# Patient Record
Sex: Female | Born: 1950 | Race: White | Hispanic: No | Smoking: Never smoker
Health system: Southern US, Community
[De-identification: ages and names within clinical notes are randomized; demographics above are authoritative.]

## PROBLEM LIST (undated history)

## (undated) ENCOUNTER — Emergency Department (HOSPITAL_COMMUNITY)

## (undated) DIAGNOSIS — K56609 Unspecified intestinal obstruction, unspecified as to partial versus complete obstruction: Secondary | ICD-10-CM

## (undated) DIAGNOSIS — I471 Supraventricular tachycardia, unspecified: Secondary | ICD-10-CM

## (undated) DIAGNOSIS — M1990 Unspecified osteoarthritis, unspecified site: Secondary | ICD-10-CM

## (undated) DIAGNOSIS — I2510 Atherosclerotic heart disease of native coronary artery without angina pectoris: Secondary | ICD-10-CM

## (undated) DIAGNOSIS — M810 Age-related osteoporosis without current pathological fracture: Secondary | ICD-10-CM

## (undated) DIAGNOSIS — K449 Diaphragmatic hernia without obstruction or gangrene: Secondary | ICD-10-CM

## (undated) DIAGNOSIS — K08109 Complete loss of teeth, unspecified cause, unspecified class: Secondary | ICD-10-CM

## (undated) DIAGNOSIS — K219 Gastro-esophageal reflux disease without esophagitis: Secondary | ICD-10-CM

## (undated) DIAGNOSIS — Z862 Personal history of diseases of the blood and blood-forming organs and certain disorders involving the immune mechanism: Secondary | ICD-10-CM

## (undated) DIAGNOSIS — Z5189 Encounter for other specified aftercare: Secondary | ICD-10-CM

## (undated) DIAGNOSIS — Z9884 Bariatric surgery status: Secondary | ICD-10-CM

## (undated) DIAGNOSIS — I719 Aortic aneurysm of unspecified site, without rupture: Secondary | ICD-10-CM

## (undated) DIAGNOSIS — K469 Unspecified abdominal hernia without obstruction or gangrene: Secondary | ICD-10-CM

## (undated) DIAGNOSIS — E039 Hypothyroidism, unspecified: Secondary | ICD-10-CM

## (undated) DIAGNOSIS — Z8719 Personal history of other diseases of the digestive system: Secondary | ICD-10-CM

## (undated) DIAGNOSIS — D649 Anemia, unspecified: Secondary | ICD-10-CM

## (undated) DIAGNOSIS — Z973 Presence of spectacles and contact lenses: Secondary | ICD-10-CM

## (undated) DIAGNOSIS — E079 Disorder of thyroid, unspecified: Secondary | ICD-10-CM

## (undated) HISTORY — PX: TOTAL HIP ARTHROPLASTY: SHX124

## (undated) HISTORY — DX: Hypothyroidism, unspecified: E03.9

## (undated) HISTORY — PX: CHOLECYSTECTOMY: SHX55

## (undated) HISTORY — PX: ABDOMINAL HYSTERECTOMY: SHX81

## (undated) HISTORY — DX: Gastro-esophageal reflux disease without esophagitis: K21.9

## (undated) HISTORY — DX: Age-related osteoporosis without current pathological fracture: M81.0

## (undated) HISTORY — PX: ROTATOR CUFF REPAIR: SHX139

## (undated) HISTORY — PX: REPLACEMENT TOTAL KNEE: SUR1224

## (undated) HISTORY — PX: SMALL INTESTINE SURGERY: SHX150

## (undated) HISTORY — DX: Anemia, unspecified: D64.9

## (undated) HISTORY — PX: CATARACT EXTRACTION: SUR2

## (undated) HISTORY — PX: COLON SURGERY: SHX602

## (undated) HISTORY — DX: Encounter for other specified aftercare: Z51.89

## (undated) HISTORY — PX: FRACTURE SURGERY: SHX138

## (undated) HISTORY — PX: EYE SURGERY: SHX253

## (undated) HISTORY — PX: HERNIA REPAIR: SHX51

## (undated) HISTORY — PX: JOINT REPLACEMENT: SHX530

## (undated) HISTORY — PX: GASTRIC BYPASS: SHX52

---

## 2008-08-12 ENCOUNTER — Ambulatory Visit (HOSPITAL_COMMUNITY): Admission: RE | Admit: 2008-08-12 | Discharge: 2008-08-12 | Payer: Self-pay | Admitting: Internal Medicine

## 2009-08-13 ENCOUNTER — Ambulatory Visit (HOSPITAL_COMMUNITY): Admission: RE | Admit: 2009-08-13 | Discharge: 2009-08-13 | Payer: Self-pay | Admitting: Internal Medicine

## 2009-08-19 ENCOUNTER — Ambulatory Visit (HOSPITAL_COMMUNITY): Admission: RE | Admit: 2009-08-19 | Discharge: 2009-08-19 | Payer: Self-pay | Admitting: Internal Medicine

## 2011-06-27 ENCOUNTER — Other Ambulatory Visit (HOSPITAL_COMMUNITY): Payer: Self-pay | Admitting: Family Medicine

## 2011-06-27 DIAGNOSIS — Z139 Encounter for screening, unspecified: Secondary | ICD-10-CM

## 2011-06-30 ENCOUNTER — Ambulatory Visit (HOSPITAL_COMMUNITY)
Admission: RE | Admit: 2011-06-30 | Discharge: 2011-06-30 | Disposition: A | Discharge status: Home / Self Care | Payer: BC Managed Care – PPO | Source: Ambulatory Visit | Attending: Family Medicine | Admitting: Family Medicine

## 2011-06-30 DIAGNOSIS — Z139 Encounter for screening, unspecified: Secondary | ICD-10-CM

## 2011-06-30 DIAGNOSIS — Z1231 Encounter for screening mammogram for malignant neoplasm of breast: Secondary | ICD-10-CM | POA: Insufficient documentation

## 2011-07-05 ENCOUNTER — Other Ambulatory Visit: Payer: Self-pay | Admitting: Family Medicine

## 2011-07-05 DIAGNOSIS — R928 Other abnormal and inconclusive findings on diagnostic imaging of breast: Secondary | ICD-10-CM

## 2011-07-06 ENCOUNTER — Other Ambulatory Visit: Payer: Self-pay | Admitting: Family Medicine

## 2011-07-06 DIAGNOSIS — R928 Other abnormal and inconclusive findings on diagnostic imaging of breast: Secondary | ICD-10-CM

## 2011-07-26 ENCOUNTER — Ambulatory Visit (HOSPITAL_COMMUNITY)
Admission: RE | Admit: 2011-07-26 | Discharge: 2011-07-26 | Disposition: A | Discharge status: Home / Self Care | Payer: BC Managed Care – PPO | Source: Ambulatory Visit | Attending: Family Medicine | Admitting: Family Medicine

## 2011-07-26 ENCOUNTER — Other Ambulatory Visit (HOSPITAL_COMMUNITY): Payer: Self-pay | Admitting: Family Medicine

## 2011-07-26 ENCOUNTER — Other Ambulatory Visit: Payer: Self-pay | Admitting: Radiology

## 2011-07-26 ENCOUNTER — Inpatient Hospital Stay (HOSPITAL_COMMUNITY): Admission: RE | Admit: 2011-07-26 | Discharge: 2011-07-26 | Payer: BC Managed Care – PPO | Source: Ambulatory Visit

## 2011-07-26 ENCOUNTER — Other Ambulatory Visit: Payer: Self-pay | Admitting: Family Medicine

## 2011-07-26 DIAGNOSIS — N6019 Diffuse cystic mastopathy of unspecified breast: Secondary | ICD-10-CM | POA: Insufficient documentation

## 2011-07-26 DIAGNOSIS — R928 Other abnormal and inconclusive findings on diagnostic imaging of breast: Secondary | ICD-10-CM

## 2011-07-26 DIAGNOSIS — N63 Unspecified lump in unspecified breast: Secondary | ICD-10-CM

## 2011-07-26 NOTE — Procedures (Signed)
Ultrasound guided right breast core biopsy performed with 14 gauge Bard device utilizing sterile technique and under local anesthesia with 10 cc of 2% lidocaine. No blood loss or immediate complications. Clip placed. Pressure for 10 minutes applied. 3 passes performed. Mass collapsed on initial pass.

## 2011-07-26 NOTE — OR Nursing (Addendum)
Patient arrived ambulatory to Ultrasound Rm 1.  Dr Jean Rosenthal in to explain procedure to patient.  Patient verbalized understanding of procedure.  Consent obtained.   Pt For Core Biopsy right breast.  Time out at 11:30.  Xylocaine 2% 10 injected per Dr Jean Rosenthal at 11:32.  Tolerated well. Ended at 11:42.

## 2012-07-19 ENCOUNTER — Other Ambulatory Visit (HOSPITAL_COMMUNITY): Payer: Self-pay | Admitting: Family Medicine

## 2012-07-19 DIAGNOSIS — Z139 Encounter for screening, unspecified: Secondary | ICD-10-CM

## 2012-07-26 ENCOUNTER — Ambulatory Visit (HOSPITAL_COMMUNITY)
Admission: RE | Admit: 2012-07-26 | Discharge: 2012-07-26 | Disposition: A | Discharge status: Home / Self Care | Payer: BC Managed Care – PPO | Source: Ambulatory Visit | Attending: Family Medicine | Admitting: Family Medicine

## 2012-07-26 DIAGNOSIS — Z139 Encounter for screening, unspecified: Secondary | ICD-10-CM

## 2012-07-26 DIAGNOSIS — Z1231 Encounter for screening mammogram for malignant neoplasm of breast: Secondary | ICD-10-CM | POA: Insufficient documentation

## 2013-07-17 ENCOUNTER — Other Ambulatory Visit (HOSPITAL_COMMUNITY): Payer: Self-pay | Admitting: Family Medicine

## 2013-07-17 DIAGNOSIS — Z139 Encounter for screening, unspecified: Secondary | ICD-10-CM

## 2013-07-28 ENCOUNTER — Ambulatory Visit (HOSPITAL_COMMUNITY)
Admission: RE | Admit: 2013-07-28 | Discharge: 2013-07-28 | Disposition: A | Discharge status: Home / Self Care | Payer: BC Managed Care – PPO | Source: Ambulatory Visit | Attending: Family Medicine | Admitting: Family Medicine

## 2013-07-28 DIAGNOSIS — Z139 Encounter for screening, unspecified: Secondary | ICD-10-CM

## 2013-07-28 DIAGNOSIS — Z1231 Encounter for screening mammogram for malignant neoplasm of breast: Secondary | ICD-10-CM | POA: Insufficient documentation

## 2013-08-04 ENCOUNTER — Ambulatory Visit (HOSPITAL_COMMUNITY): Payer: BC Managed Care – PPO

## 2013-12-14 DIAGNOSIS — Z8719 Personal history of other diseases of the digestive system: Secondary | ICD-10-CM

## 2013-12-14 HISTORY — DX: Personal history of other diseases of the digestive system: Z87.19

## 2014-06-23 ENCOUNTER — Other Ambulatory Visit (HOSPITAL_COMMUNITY): Payer: Self-pay | Admitting: Family Medicine

## 2014-06-23 DIAGNOSIS — Z1231 Encounter for screening mammogram for malignant neoplasm of breast: Secondary | ICD-10-CM

## 2014-07-30 ENCOUNTER — Ambulatory Visit (HOSPITAL_COMMUNITY)
Admission: RE | Admit: 2014-07-30 | Discharge: 2014-07-30 | Disposition: A | Discharge status: Home / Self Care | Payer: BC Managed Care – PPO | Source: Ambulatory Visit | Attending: Family Medicine | Admitting: Family Medicine

## 2014-07-30 DIAGNOSIS — Z1231 Encounter for screening mammogram for malignant neoplasm of breast: Secondary | ICD-10-CM | POA: Diagnosis not present

## 2014-08-05 ENCOUNTER — Other Ambulatory Visit: Payer: Self-pay | Admitting: Family Medicine

## 2014-08-05 DIAGNOSIS — R928 Other abnormal and inconclusive findings on diagnostic imaging of breast: Secondary | ICD-10-CM

## 2014-08-18 ENCOUNTER — Ambulatory Visit (HOSPITAL_COMMUNITY)
Admission: RE | Admit: 2014-08-18 | Discharge: 2014-08-18 | Disposition: A | Discharge status: Home / Self Care | Payer: BC Managed Care – PPO | Source: Ambulatory Visit | Attending: Family Medicine | Admitting: Family Medicine

## 2014-08-18 ENCOUNTER — Other Ambulatory Visit: Payer: Self-pay | Admitting: Family Medicine

## 2014-08-18 DIAGNOSIS — R928 Other abnormal and inconclusive findings on diagnostic imaging of breast: Secondary | ICD-10-CM

## 2015-01-05 ENCOUNTER — Ambulatory Visit: Payer: Self-pay | Admitting: Rheumatology

## 2015-08-11 ENCOUNTER — Other Ambulatory Visit (HOSPITAL_COMMUNITY): Payer: Self-pay | Admitting: Family Medicine

## 2015-08-11 DIAGNOSIS — Z1231 Encounter for screening mammogram for malignant neoplasm of breast: Secondary | ICD-10-CM

## 2015-08-16 ENCOUNTER — Other Ambulatory Visit: Payer: Self-pay | Admitting: Radiology

## 2015-08-23 ENCOUNTER — Ambulatory Visit (HOSPITAL_COMMUNITY)
Admission: RE | Admit: 2015-08-23 | Discharge: 2015-08-23 | Disposition: A | Discharge status: Home / Self Care | Payer: BC Managed Care – PPO | Source: Ambulatory Visit | Attending: Family Medicine | Admitting: Family Medicine

## 2015-08-23 DIAGNOSIS — Z1231 Encounter for screening mammogram for malignant neoplasm of breast: Secondary | ICD-10-CM | POA: Insufficient documentation

## 2015-10-04 DIAGNOSIS — G44229 Chronic tension-type headache, not intractable: Secondary | ICD-10-CM | POA: Insufficient documentation

## 2015-10-04 DIAGNOSIS — R55 Syncope and collapse: Secondary | ICD-10-CM | POA: Insufficient documentation

## 2015-10-04 DIAGNOSIS — H8110 Benign paroxysmal vertigo, unspecified ear: Secondary | ICD-10-CM | POA: Insufficient documentation

## 2015-10-09 DIAGNOSIS — R200 Anesthesia of skin: Secondary | ICD-10-CM | POA: Insufficient documentation

## 2015-11-02 DIAGNOSIS — G5603 Carpal tunnel syndrome, bilateral upper limbs: Secondary | ICD-10-CM | POA: Insufficient documentation

## 2016-08-08 ENCOUNTER — Other Ambulatory Visit (HOSPITAL_COMMUNITY): Payer: Self-pay | Admitting: Physician Assistant

## 2016-08-08 DIAGNOSIS — Z1231 Encounter for screening mammogram for malignant neoplasm of breast: Secondary | ICD-10-CM

## 2016-08-28 ENCOUNTER — Ambulatory Visit (HOSPITAL_COMMUNITY): Payer: Self-pay

## 2016-08-28 ENCOUNTER — Ambulatory Visit (HOSPITAL_COMMUNITY)
Admission: RE | Admit: 2016-08-28 | Discharge: 2016-08-28 | Disposition: A | Discharge status: Home / Self Care | Payer: BC Managed Care – PPO | Source: Ambulatory Visit | Attending: Physician Assistant | Admitting: Physician Assistant

## 2016-08-28 DIAGNOSIS — Z1231 Encounter for screening mammogram for malignant neoplasm of breast: Secondary | ICD-10-CM | POA: Insufficient documentation

## 2016-10-16 DIAGNOSIS — Z9884 Bariatric surgery status: Secondary | ICD-10-CM

## 2016-10-16 HISTORY — DX: Bariatric surgery status: Z98.84

## 2017-03-20 DIAGNOSIS — Z8719 Personal history of other diseases of the digestive system: Secondary | ICD-10-CM | POA: Insufficient documentation

## 2017-04-03 ENCOUNTER — Telehealth: Payer: Self-pay | Admitting: Nutrition

## 2017-04-03 NOTE — Telephone Encounter (Signed)
vm left to call to schedule appt.

## 2017-05-16 ENCOUNTER — Encounter: Payer: BC Managed Care – PPO | Attending: Physician Assistant | Admitting: Nutrition

## 2017-05-16 VITALS — Ht 65.0 in | Wt 198.0 lb

## 2017-05-16 DIAGNOSIS — E119 Type 2 diabetes mellitus without complications: Secondary | ICD-10-CM | POA: Insufficient documentation

## 2017-05-16 DIAGNOSIS — Z713 Dietary counseling and surveillance: Secondary | ICD-10-CM | POA: Insufficient documentation

## 2017-05-16 DIAGNOSIS — E1165 Type 2 diabetes mellitus with hyperglycemia: Secondary | ICD-10-CM

## 2017-05-16 DIAGNOSIS — E118 Type 2 diabetes mellitus with unspecified complications: Secondary | ICD-10-CM

## 2017-05-16 DIAGNOSIS — E669 Obesity, unspecified: Secondary | ICD-10-CM

## 2017-05-16 DIAGNOSIS — Z833 Family history of diabetes mellitus: Secondary | ICD-10-CM | POA: Diagnosis not present

## 2017-05-16 DIAGNOSIS — IMO0002 Reserved for concepts with insufficient information to code with codable children: Secondary | ICD-10-CM

## 2017-05-16 NOTE — Progress Notes (Signed)
Diabetes Self-Management Education  Visit Type: First/Initial  Appt. Start Time: 1500 Appt. End Time: 1600  05/16/2017  [name], identified by name and date of birth, is a 66 y.o. female with a diagnosis of Diabetes: Type 2. She lives with her husband. Works full time. Eats 3 meals per day. Eats out often. Admits to eating ice cream before bedtime. Not exercising but willing to start walking. Doesn't want to go on medication. Willing to work on weight loss. A1C 7.1%.  She has family history of DM and had possible GDM with 2 babies over 9 lbs. Current diet is higher in fat, sodium and low in fresh fruits and vegetables.  ASSESSMENT  Height 5\' 5"  (1.651 m), weight 198 lb (89.8 kg). Body mass index is 32.95 kg/m.      Diabetes Self-Management Education - 05/16/17 1500      Visit Information   Visit Type First/Initial     Initial Visit   Diabetes Type Type 2   Are you currently following a meal plan? No   Are you taking your medications as prescribed? Not on Medications   Date Diagnosed May 2018     Health Coping   How would you rate your overall health? Good     Psychosocial Assessment   Patient Belief/Attitude about Diabetes Motivated to manage diabetes   Self-care barriers None   Self-management support Family   Other persons present Patient   Patient Concerns Nutrition/Meal planning;Monitoring;Healthy Lifestyle;Weight Control   Special Needs None   Preferred Learning Style No preference indicated   Learning Readiness Ready   How often do you need to have someone help you when you read instructions, pamphlets, or other written materials from your doctor or pharmacy? 1 - Never   What is the last grade level you completed in school? 12     Pre-Education Assessment   Patient understands the diabetes disease and treatment process. Needs Instruction   Patient understands incorporating nutritional management into lifestyle. Needs Instruction   Patient undertands  incorporating physical activity into lifestyle. Needs Instruction   Patient understands using medications safely. Needs Instruction   Patient understands monitoring blood glucose, interpreting and using results Needs Instruction   Patient understands prevention, detection, and treatment of acute complications. Needs Instruction   Patient understands prevention, detection, and treatment of chronic complications. Needs Instruction   Patient understands how to develop strategies to address psychosocial issues. Needs Instruction   Patient understands how to develop strategies to promote health/change behavior. Needs Instruction     Complications   Last HgB A1C per patient/outside source 7.1 %   How often do you check your blood sugar? 3-4 times / week   Fasting Blood glucose range (mg/dL) 16-109   Postprandial Blood glucose range (mg/dL) 604-540   Number of hypoglycemic episodes per month 0   Number of hyperglycemic episodes per week 0   Have you had a dilated eye exam in the past 12 months? Yes   Have you had a dental exam in the past 12 months? Yes   Are you checking your feet? Yes   How many days per week are you checking your feet? 7     Dietary Intake   Breakfast Oatmeal with nuts   Snack (morning) watermelon 1 cup   Lunch 1/4 sub, cheese steak, Dt Green Tea   Snack (afternoon) watermelon or fruit   Dinner [employer], potatoes, biscuit, water   Snack (evening) watermelon 1 cup   Beverage(s) water  Exercise   Exercise Type ADL's     Patient Education   Previous Diabetes Education No   Disease state  Definition of diabetes, type 1 and 2, and the diagnosis of diabetes   Nutrition management  Carbohydrate counting;Role of diet in the treatment of diabetes and the relationship between the three main macronutrients and blood glucose level;Meal timing in regards to the patients' current diabetes medication.;Information on hints to eating out and maintain blood glucose  control.;Meal options for control of blood glucose level and chronic complications.   Physical activity and exercise  Role of exercise on diabetes management, blood pressure control and cardiac health.;Identified with patient nutritional and/or medication changes necessary with exercise.;Helped patient identify appropriate exercises in relation to his/her diabetes, diabetes complications and other health issue.   Monitoring Purpose and frequency of SMBG.;Taught/discussed recording of test results and interpretation of SMBG.;Interpreting lab values - A1C, lipid, urine microalbumina.;Daily foot exams;Identified appropriate SMBG and/or A1C goals.;Yearly dilated eye exam   Chronic complications Relationship between chronic complications and blood glucose control;Lipid levels, blood glucose control and heart disease;Retinopathy and reason for yearly dilated eye exams;Reviewed with patient heart disease, higher risk of, and prevention   Psychosocial adjustment Role of stress on diabetes;Worked with patient to identify barriers to care and solutions   Personal strategies to promote health Lifestyle issues that need to be addressed for better diabetes care;Helped patient develop diabetes management plan for (enter comment)     Individualized Goals (developed by patient)   Nutrition Follow meal plan discussed;General guidelines for healthy choices and portions discussed;Adjust meds/carbs with exercise as discussed   Physical Activity Exercise 3-5 times per week;30 minutes per day   Medications Not Applicable   Monitoring  test my blood glucose as discussed   Reducing Risk examine blood glucose patterns     Post-Education Assessment   Patient understands the diabetes disease and treatment process. Needs Review   Patient understands incorporating nutritional management into lifestyle. Needs Review   Patient undertands incorporating physical activity into lifestyle. Needs Review   Patient understands using  medications safely. Needs Review   Patient understands monitoring blood glucose, interpreting and using results Needs Review   Patient understands prevention, detection, and treatment of acute complications. Needs Review   Patient understands prevention, detection, and treatment of chronic complications. Needs Review   Patient understands how to develop strategies to address psychosocial issues. Needs Review   Patient understands how to develop strategies to promote health/change behavior. Needs Review     Outcomes   Expected Outcomes Demonstrated interest in learning. Expect positive outcomes   Future DMSE 4-6 wks   Program Status Completed      Individualized Plan for Diabetes Self-Management Training:   Learning Objective:  Patient will have a greater understanding of diabetes self-management. Patient education plan is to attend individual and/or group sessions per assessed needs and concerns.   Plan:   Patient Instructions  Goals  Follow the Plate Method  Cut out ice cream  Go to [employer] twice a week   Get FBS less than 130 before breakfast and less than 150 before bed. Eat fruit with meals instead of snacks between meals. Only veggies or protein for snacks Lose 2-3  Lbs per month Get A1C to 6.5% or less    Expected Outcomes:  Demonstrated interest in learning. Expect positive outcomes  Education material provided: Living Well with Diabetes, Food label handouts, A1C conversion sheet, Meal plan card, My Plate and Carbohydrate counting sheet  If  problems or questions, patient to contact team via:  Phone and Email  Future DSME appointment: 4-6 wks

## 2017-05-16 NOTE — Patient Instructions (Signed)
Goals  Follow the Plate Method  Cut out ice cream  Go to [employer] twice a week   Get FBS less than 130 before breakfast and less than 150 before bed. Eat fruit with meals instead of snacks between meals. Only veggies or protein for snacks Lose 2-3  Lbs per month Get A1C to 6.5% or less

## 2017-07-16 ENCOUNTER — Ambulatory Visit: Payer: Self-pay | Admitting: Nutrition

## 2017-08-21 ENCOUNTER — Other Ambulatory Visit (HOSPITAL_COMMUNITY): Payer: Self-pay | Admitting: Emergency Medicine

## 2017-08-21 DIAGNOSIS — Z1231 Encounter for screening mammogram for malignant neoplasm of breast: Secondary | ICD-10-CM

## 2017-09-03 ENCOUNTER — Ambulatory Visit (HOSPITAL_COMMUNITY)
Admission: RE | Admit: 2017-09-03 | Discharge: 2017-09-03 | Disposition: A | Discharge status: Home / Self Care | Payer: BC Managed Care – PPO | Source: Ambulatory Visit | Attending: Emergency Medicine | Admitting: Emergency Medicine

## 2017-09-03 ENCOUNTER — Encounter (HOSPITAL_COMMUNITY): Payer: Self-pay

## 2017-09-03 DIAGNOSIS — Z1231 Encounter for screening mammogram for malignant neoplasm of breast: Secondary | ICD-10-CM | POA: Insufficient documentation

## 2018-04-25 DIAGNOSIS — K432 Incisional hernia without obstruction or gangrene: Secondary | ICD-10-CM | POA: Insufficient documentation

## 2018-08-08 ENCOUNTER — Other Ambulatory Visit (HOSPITAL_COMMUNITY): Payer: Self-pay | Admitting: Emergency Medicine

## 2018-08-08 DIAGNOSIS — Z1231 Encounter for screening mammogram for malignant neoplasm of breast: Secondary | ICD-10-CM

## 2018-08-12 ENCOUNTER — Emergency Department (HOSPITAL_COMMUNITY): Payer: BC Managed Care – PPO

## 2018-08-12 ENCOUNTER — Encounter (HOSPITAL_COMMUNITY): Payer: Self-pay | Admitting: Emergency Medicine

## 2018-08-12 ENCOUNTER — Observation Stay (HOSPITAL_COMMUNITY)
Admission: EM | Admit: 2018-08-12 | Discharge: 2018-08-14 | Disposition: A | Discharge status: Home / Self Care | Payer: BC Managed Care – PPO | Attending: Internal Medicine | Admitting: Internal Medicine

## 2018-08-12 DIAGNOSIS — Z7982 Long term (current) use of aspirin: Secondary | ICD-10-CM | POA: Insufficient documentation

## 2018-08-12 DIAGNOSIS — Z79899 Other long term (current) drug therapy: Secondary | ICD-10-CM | POA: Insufficient documentation

## 2018-08-12 DIAGNOSIS — E039 Hypothyroidism, unspecified: Secondary | ICD-10-CM | POA: Diagnosis not present

## 2018-08-12 DIAGNOSIS — I209 Angina pectoris, unspecified: Principal | ICD-10-CM | POA: Insufficient documentation

## 2018-08-12 DIAGNOSIS — R079 Chest pain, unspecified: Secondary | ICD-10-CM | POA: Diagnosis present

## 2018-08-12 DIAGNOSIS — K219 Gastro-esophageal reflux disease without esophagitis: Secondary | ICD-10-CM

## 2018-08-12 HISTORY — DX: Unspecified intestinal obstruction, unspecified as to partial versus complete obstruction: K56.609

## 2018-08-12 HISTORY — DX: Unspecified osteoarthritis, unspecified site: M19.90

## 2018-08-12 HISTORY — DX: Disorder of thyroid, unspecified: E07.9

## 2018-08-12 HISTORY — DX: Diaphragmatic hernia without obstruction or gangrene: K44.9

## 2018-08-12 LAB — TROPONIN I

## 2018-08-12 LAB — CBC WITH DIFFERENTIAL/PLATELET
ABS IMMATURE GRANULOCYTES: 0.02 10*3/uL (ref 0.00–0.07)
BASOS PCT: 1 %
Basophils Absolute: 0.1 10*3/uL (ref 0.0–0.1)
Eosinophils Absolute: 0 10*3/uL (ref 0.0–0.5)
Eosinophils Relative: 1 %
HCT: 37.3 % (ref 36.0–46.0)
Hemoglobin: 12 g/dL (ref 12.0–15.0)
Immature Granulocytes: 0 %
LYMPHS PCT: 20 %
Lymphs Abs: 1.3 10*3/uL (ref 0.7–4.0)
MCH: 31.4 pg (ref 26.0–34.0)
MCHC: 32.2 g/dL (ref 30.0–36.0)
MCV: 97.6 fL (ref 80.0–100.0)
MONOS PCT: 8 %
Monocytes Absolute: 0.5 10*3/uL (ref 0.1–1.0)
NEUTROS PCT: 70 %
Neutro Abs: 4.5 10*3/uL (ref 1.7–7.7)
PLATELETS: 142 10*3/uL — AB (ref 150–400)
RBC: 3.82 MIL/uL — ABNORMAL LOW (ref 3.87–5.11)
RDW: 12.5 % (ref 11.5–15.5)
WBC: 6.5 10*3/uL (ref 4.0–10.5)
nRBC: 0 % (ref 0.0–0.2)

## 2018-08-12 LAB — BASIC METABOLIC PANEL
Anion gap: 8 (ref 5–15)
BUN: 16 mg/dL (ref 8–23)
CALCIUM: 8.8 mg/dL — AB (ref 8.9–10.3)
CO2: 26 mmol/L (ref 22–32)
Chloride: 105 mmol/L (ref 98–111)
Creatinine, Ser: 0.51 mg/dL (ref 0.44–1.00)
GLUCOSE: 107 mg/dL — AB (ref 70–99)
POTASSIUM: 3.4 mmol/L — AB (ref 3.5–5.1)
SODIUM: 139 mmol/L (ref 135–145)

## 2018-08-12 NOTE — H&P (Signed)
History and Physical    Kayla Dean:[id number] DOB: Feb 20, 1951 DOA: 08/12/2018  PCP: Philbert Riser, MD   Patient coming from: Home  Chief Complaint: Chest pain  HPI: Kayla Dean is a 68 y.o. female with medical history significant for SBO, thyroid disease, chronic back pain who presented to the ED with complaints of sudden onset of chest pain.  Patient was at work today, sitting down, when suddenly she had 10 out of 10 radiating, mid chest pain, with associated diaphoresis, palpitation nausea without vomiting, she is unsure if she had some difficulty breathing.  Pain is described as a tightness/bandlike sensation around her chest. Never smoker.  Family history of coronary artery disease in her brother when he was 55.  No personal or family history of blood clots.  No recent travel no lower extremity pain no swelling. She takes daily meloxicam for arthritis.   ED Course: Tachycardic heart rate 48-55, otherwise stable vitals.  Two-view chest x-ray negative for acute abnormality.  EKG sinus bradycardia rate 50, T wave flattening 3 aVF-NO old EKG to compare.  Troponin unremarkable 0.63.  Hospitalist called to admit for chest pain rule out ACS.  Review of Systems: As per HPI all other systems reviewed and negative  Past Medical History:  Diagnosis Date  . Arthritis   . Hiatal hernia   . Small bowel obstruction (HCC)   . Thyroid disease     Past Surgical History:  Procedure Laterality Date  . ABDOMINAL HYSTERECTOMY    . CHOLECYSTECTOMY    . COLON SURGERY    . HERNIA REPAIR       reports that she has never smoked. She has never used smokeless tobacco. She reports that she does not drink alcohol or use drugs.  No Known Allergies  Family history coronary artery disease in her brother at age 32  Prior to Admission medications   Medication Sig Start Date End Date Taking? Authorizing Provider  Calcium Carb-Cholecalciferol (CALCIUM 600 + D) 600-200 MG-UNIT TABS Take 2  tablets by mouth every morning.   Yes [provider]  ferrous sulfate 325 (65 FE) MG tablet Take 325 mg by mouth every morning.   Yes [provider]  levothyroxine (SYNTHROID, LEVOTHROID) 137 MCG tablet Take 137 mcg by mouth daily before breakfast.   Yes [provider]  meloxicam (MOBIC) 15 MG tablet Take 15 mg by mouth every morning.  07/15/18  Yes [provider]  Multiple Vitamin (MULTIVITAMIN WITH MINERALS) TABS tablet Take 1 tablet by mouth every morning.   Yes [provider]    Physical Exam: Vitals:   08/12/18 1600 08/12/18 1700 08/12/18 1930 08/12/18 2030  BP: (!) 141/103 (!) 130/92 135/85 122/67  Pulse: (!) 48 (!) 51 (!) 55 (!) 52  Resp: 14 12 14 13   Temp:      TempSrc:      SpO2: 97% 98% 97% 96%  Weight:      Height:        Constitutional: NAD, calm, comfortable Vitals:   08/12/18 1600 08/12/18 1700 08/12/18 1930 08/12/18 2030  BP: (!) 141/103 (!) 130/92 135/85 122/67  Pulse: (!) 48 (!) 51 (!) 55 (!) 52  Resp: 14 12 14 13   Temp:      TempSrc:      SpO2: 97% 98% 97% 96%  Weight:      Height:       Eyes: PERRL, lids and conjunctivae normal ENMT: Mucous membranes are moist. Posterior pharynx clear  of any exudate or lesions.  Neck: normal, supple, no masses, no thyromegaly Respiratory: clear to auscultation bilaterally, no wheezing, no crackles. Normal respiratory effort. No accessory muscle use.  Cardiovascular: Regular rate and rhythm, no murmurs / rubs / gallops. Trace bilat lower extremity edema. 2+ pedal pulses.  Abdomen: no tenderness, no masses palpated. No hepatosplenomegaly. Bowel sounds positive.  Midline incisional hernia Musculoskeletal: no clubbing / cyanosis. No joint deformity upper and lower extremities. Good ROM, no contractures. Normal muscle tone.  Skin: no rashes, lesions, ulcers. No induration Neurologic: CN 2-12 grossly intact. Sensation intact, DTR normal. Strength 5/5 in all 4.  Psychiatric: Normal  judgment and insight. Alert and oriented x 3. Normal mood.   Labs on Admission: I have personally reviewed following labs and imaging studies  CBC: Recent Labs  Lab 08/12/18 1630  WBC 6.5  NEUTROABS 4.5  HGB 12.0  HCT 37.3  MCV 97.6  PLT 142*   Basic Metabolic Panel: Recent Labs  Lab 08/12/18 1630  NA 139  K 3.4*  CL 105  CO2 26  GLUCOSE 107*  BUN 16  CREATININE 0.51  CALCIUM 8.8*   Cardiac Enzymes: Recent Labs  Lab 08/12/18 1630  TROPONINI <0.03    Radiological Exams on Admission: Dg Chest 2 View  Result Date: 08/12/2018 CLINICAL DATA:  Chest tightness and weakness. EXAM: CHEST - 2 VIEW COMPARISON:  None. FINDINGS: Trachea is midline. Heart is enlarged. Linear atelectasis or scarring in the medial aspects of both lung bases. Lungs are otherwise clear. No pleural fluid. Suspect a hiatal hernia. IMPRESSION: No acute findings. Electronically Signed   By: Leanna Battles M.D.   On: 08/12/2018 16:37    EKG: Independently reviewed.  Sinus bradycardia heart rate 50.  Normal intervals. T wave flattening III, AVL.  No old EKG to compare.  Assessment/Plan Active Problems:   Chest pain   Chest pain-Atypical. Troponin x1- neg. EKG with nonspecific T wave abnormalities III, AVL.  Family history of coronary artery disease.  Never smoker.  Heart score 3-4. 324mg  aspirin given.  -EKG a.m. - Trop x 2 - ECHO -Cardiology consultation in a.m. - NPO midnight - Lipid panel a.m   DVT prophylaxis: Lovenox Code Status: Full Family Communication: None at bedside Disposition Plan: 1-2 days Consults called: Cardiology Admission status: Obs, tele   Onnie Boer MD [facility] Pager [phone] From 3PM-11PM.  Otherwise please contact night-coverage [email or web address] Password [facility]  08/12/2018, 9:19 PM

## 2018-08-12 NOTE — ED Provider Notes (Addendum)
[facility] EMERGENCY DEPARTMENT Provider Note   CSN: [id number] Arrival date & time: 08/12/18  1523     History   Chief Complaint Chief Complaint  Patient presents with  . Chest Pain    HPI Kayla Dean is a 67 y.o. female.  Chief complaint chest pain.  Symptoms started this morning with pain in her upper back.  At approximately noon today, she started having tightness like a "belt sensation" in her anterior chest with associated dyspnea, diaphoresis, nausea.  No previous history of MI.  Her brother had an MI at a similar age.  Cardiac risk factors are minimal.  Specifically no diabetes, hypertension, cigarette smoking.  She was at work when symptoms started.  Nothing makes her symptoms better or worse.     Past Medical History:  Diagnosis Date  . Arthritis   . Hiatal hernia   . Small bowel obstruction (HCC)   . Thyroid disease     There are no active problems to display for this patient.   Past Surgical History:  Procedure Laterality Date  . ABDOMINAL HYSTERECTOMY    . CHOLECYSTECTOMY    . COLON SURGERY    . HERNIA REPAIR       OB History   None      Home Medications    Prior to Admission medications   Medication Sig Start Date End Date Taking? Authorizing Provider  levothyroxine (SYNTHROID, LEVOTHROID) 112 MCG tablet Take 112 mcg by mouth daily.      [provider]    Family History History reviewed. No pertinent family history.  Social History Social History   Tobacco Use  . Smoking status: Never Smoker  . Smokeless tobacco: Never Used  Substance Use Topics  . Alcohol use: Never    Frequency: Never  . Drug use: Never     Allergies   Patient has no known allergies.   Review of Systems Review of Systems  All other systems reviewed and are negative.    Physical Exam Updated Vital Signs BP (!) 142/100 (BP Location: Left Arm)   Pulse (!) 50   Temp 98 F (36.7 C) (Oral)   Resp 18   Ht 5\' 5"  (1.651 m)   Wt 66.2 kg    SpO2 96%   BMI 24.30 kg/m   Physical Exam  Constitutional: She is oriented to person, place, and time. She appears well-developed and well-nourished.  HENT:  Head: Normocephalic and atraumatic.  Eyes: Conjunctivae are normal.  Neck: Neck supple.  Cardiovascular: Normal rate and regular rhythm.  Pulmonary/Chest: Effort normal and breath sounds normal.  Abdominal: Soft. Bowel sounds are normal.  Musculoskeletal: Normal range of motion.  Neurological: She is alert and oriented to person, place, and time.  Skin: Skin is warm and dry.  Psychiatric: She has a normal mood and affect. Her behavior is normal.  Nursing note and vitals reviewed.    ED Treatments / Results  Labs (all labs ordered are listed, but only abnormal results are displayed) Labs Reviewed  CBC WITH DIFFERENTIAL/PLATELET  BASIC METABOLIC PANEL  TROPONIN I    EKG None  Radiology No results found.  Procedures Procedures (including critical care time)  Medications Ordered in ED Medications - No data to display   Initial Impression / Assessment and Plan / ED Course  I have reviewed the triage vital signs and the nursing notes.  Pertinent labs & imaging results that were available during my care of the patient were reviewed by me and  considered in my medical decision making (see chart for details).     History and physical consistent with anginal equivalent chest pain.  She is hemodynamically stable.  Initial EKG and troponin negative.  Will admit to hospitalist service.  Final Clinical Impressions(s) / ED Diagnoses   Final diagnoses:  Chest pain, unspecified type    ED Discharge Orders    None       Donnetta Hutching, MD 08/12/18 1626    Donnetta Hutching, MD 08/12/18 [phone]

## 2018-08-12 NOTE — ED Notes (Signed)
Pt resting with eyes open, appears to be in no distress. Respirations are even and unlabored.  

## 2018-08-12 NOTE — ED Triage Notes (Signed)
Pt reports waking with bad back pain and then beginning to have chest pain around 200pm.  States she took tylenol 3 pta and ems gave her aspirin 324mg  by ems.

## 2018-08-13 ENCOUNTER — Encounter (HOSPITAL_COMMUNITY): Payer: Self-pay | Admitting: Student

## 2018-08-13 ENCOUNTER — Observation Stay (HOSPITAL_COMMUNITY): Payer: BC Managed Care – PPO

## 2018-08-13 ENCOUNTER — Other Ambulatory Visit: Payer: Self-pay

## 2018-08-13 DIAGNOSIS — Z7982 Long term (current) use of aspirin: Secondary | ICD-10-CM | POA: Diagnosis not present

## 2018-08-13 DIAGNOSIS — I351 Nonrheumatic aortic (valve) insufficiency: Secondary | ICD-10-CM

## 2018-08-13 DIAGNOSIS — R079 Chest pain, unspecified: Secondary | ICD-10-CM | POA: Diagnosis not present

## 2018-08-13 DIAGNOSIS — E039 Hypothyroidism, unspecified: Secondary | ICD-10-CM

## 2018-08-13 DIAGNOSIS — K219 Gastro-esophageal reflux disease without esophagitis: Secondary | ICD-10-CM | POA: Diagnosis not present

## 2018-08-13 DIAGNOSIS — I209 Angina pectoris, unspecified: Secondary | ICD-10-CM | POA: Diagnosis not present

## 2018-08-13 LAB — MRSA PCR SCREENING: MRSA BY PCR: NEGATIVE

## 2018-08-13 LAB — HEMOGLOBIN A1C
HEMOGLOBIN A1C: 4.7 % — AB (ref 4.8–5.6)
Mean Plasma Glucose: 88.19 mg/dL

## 2018-08-13 LAB — LIPID PANEL
CHOL/HDL RATIO: 3.4 ratio
Cholesterol: 126 mg/dL (ref 0–200)
HDL: 37 mg/dL — ABNORMAL LOW (ref >40)
LDL CALC: 77 mg/dL (ref 0–99)
Triglycerides: 60 mg/dL (ref <150)
VLDL: 12 mg/dL (ref 0–40)

## 2018-08-13 LAB — ECHOCARDIOGRAM COMPLETE
Height: 65 in
Weight: 2363.33 oz

## 2018-08-13 LAB — TROPONIN I

## 2018-08-13 MED ORDER — ONDANSETRON HCL 4 MG/2ML IJ SOLN: 4.0000 mg | Freq: Four times a day (QID) | INTRAMUSCULAR | Status: DC | PRN

## 2018-08-13 MED ORDER — LEVOTHYROXINE SODIUM 137 MCG PO TABS
137.0000 ug | ORAL_TABLET | Freq: Every day | ORAL | Status: DC
  Administered 2018-08-13: 137 ug via ORAL
  Filled 2018-08-13: qty 1

## 2018-08-13 MED ORDER — ACETAMINOPHEN 650 MG RE SUPP: 650.0000 mg | Freq: Four times a day (QID) | RECTAL | Status: DC | PRN

## 2018-08-13 MED ORDER — ENOXAPARIN SODIUM 40 MG/0.4ML ~~LOC~~ SOLN
40.0000 mg | SUBCUTANEOUS | Status: DC
  Administered 2018-08-13: 40 mg via SUBCUTANEOUS
  Filled 2018-08-13 (×2): qty 0.4

## 2018-08-13 MED ORDER — ASPIRIN EC 81 MG PO TBEC
81.0000 mg | DELAYED_RELEASE_TABLET | Freq: Every day | ORAL | Status: DC
  Administered 2018-08-13 – 2018-08-14 (×2): 81 mg via ORAL
  Filled 2018-08-13 (×2): qty 1

## 2018-08-13 MED ORDER — ONDANSETRON HCL 4 MG PO TABS: 4.0000 mg | ORAL_TABLET | Freq: Four times a day (QID) | ORAL | Status: DC | PRN

## 2018-08-13 MED ORDER — POLYETHYLENE GLYCOL 3350 17 G PO PACK: 17.0000 g | PACK | Freq: Every day | ORAL | Status: DC | PRN

## 2018-08-13 MED ORDER — ACETAMINOPHEN 325 MG PO TABS
650.0000 mg | ORAL_TABLET | Freq: Four times a day (QID) | ORAL | Status: DC | PRN
  Administered 2018-08-13: 650 mg via ORAL
  Filled 2018-08-13: qty 2

## 2018-08-13 NOTE — Consult Note (Addendum)
Cardiology Consult    Patient ID: Kayla Dean; [id number]; Mar 13, 1951   Admit date: 08/12/2018 Date of Consult: 08/13/2018  Primary Care Provider: Philbert Riser, MD Primary Cardiologist: New to [facility] - Dr. Wyline Mood  Patient Profile    Kayla Dean is a 67 y.o. female with past medical history of hypothyroidism and family history of CAD who is being seen today for the evaluation of chest pain at the request of Dr. Mariea Clonts.   History of Present Illness    Kayla Dean reports having generalized fatigue over the past few weeks but denies any associated chest pain or dyspnea during that timeframe. Yesterday morning, she developed a stabbing discomfort along her left shoulder and took Tylenol #3 but did not experience any improvement in her symptoms. Starting later that afternoon, she developed a tightness along her entire precordium which felt like she was wearing a very tight bra. She reports associated nausea, diaphoresis, and dyspnea during that timeframe.  ymptoms lasted for approximately an hour and improved with administration of ASA.  She denies any recurrent pain since.  No known personal history of CAD or cardiac arrhythmias. She does have hypothyroidism but denies any known HTN, HLD, or Type II DM.  Reports a strong family history of CAD with her brother having required stenting at the age of 67 and her father having known CAD as well.  She denies any history of alcohol use, tobacco use, or recreational drug use.  Initial labs show WBC 6.5, Hgb 12.0, platelets 142, Na+ 139, K+ 3.4, and creatinine 0.51. Initial and cyclic troponin values have been negative. CXR with no acute cardiopulmonary findings. EKG shows sinus bradycardia, HR 50, with no diagnostic ST abnormalities (no prior tracings available for comparison).    Past Medical History:  Diagnosis Date  . Arthritis   . Hiatal hernia   . Small bowel obstruction (HCC)   . Thyroid disease     Past Surgical History:    Procedure Laterality Date  . ABDOMINAL HYSTERECTOMY    . CHOLECYSTECTOMY    . COLON SURGERY    . HERNIA REPAIR       Home Medications:  Prior to Admission medications   Medication Sig Start Date End Date Taking? Authorizing Provider  Calcium Carb-Cholecalciferol (CALCIUM 600 + D) 600-200 MG-UNIT TABS Take 2 tablets by mouth every morning.   Yes [provider]  ferrous sulfate 325 (65 FE) MG tablet Take 325 mg by mouth every morning.   Yes [provider]  levothyroxine (SYNTHROID, LEVOTHROID) 137 MCG tablet Take 137 mcg by mouth daily before breakfast.   Yes [provider]  meloxicam (MOBIC) 15 MG tablet Take 15 mg by mouth every morning.  07/15/18  Yes [provider]  Multiple Vitamin (MULTIVITAMIN WITH MINERALS) TABS tablet Take 1 tablet by mouth every morning.   Yes [provider]    Inpatient Medications: Scheduled Meds: . enoxaparin (LOVENOX) injection  40 mg Subcutaneous Q24H  . levothyroxine  137 mcg Oral Q0600   Continuous Infusions:  PRN Meds: acetaminophen **OR** acetaminophen, ondansetron **OR** ondansetron (ZOFRAN) IV, polyethylene glycol  Allergies:   No Known Allergies  Social History:   Social History   Socioeconomic History  . Marital status: Married    Spouse name: Not on file  . Number of children: Not on file  . Years of education: Not on file  . Highest education level: Not on file  Occupational History  . Not on file  Social  Needs  . Financial resource strain: Not on file  . Food insecurity:    Worry: Not on file    Inability: Not on file  . Transportation needs:    Medical: Not on file    Non-medical: Not on file  Tobacco Use  . Smoking status: Never Smoker  . Smokeless tobacco: Never Used  Substance and Sexual Activity  . Alcohol use: Never    Frequency: Never  . Drug use: Never  . Sexual activity: Not on file  Lifestyle  . Physical activity:    Days per week: Not on file    Minutes per  session: Not on file  . Stress: Not on file  Relationships  . Social connections:    Talks on phone: Not on file    Gets together: Not on file    Attends religious service: Not on file    Active member of club or organization: Not on file    Attends meetings of clubs or organizations: Not on file    Relationship status: Not on file  . Intimate partner violence:    Fear of current or ex partner: Not on file    Emotionally abused: Not on file    Physically abused: Not on file    Forced sexual activity: Not on file  Other Topics Concern  . Not on file  Social History Narrative  . Not on file     Family History:    Family History  Problem Relation Age of Onset  . CAD Father   . CAD Brother       Review of Systems    General:  No chills, fever, night sweats or weight changes.  Cardiovascular:  No dyspnea on exertion, edema, orthopnea, palpitations, paroxysmal nocturnal dyspnea. Positive for chest pain.  Dermatological: No rash, lesions/masses Respiratory: No cough, dyspnea Urologic: No hematuria, dysuria Abdominal:   No nausea, vomiting, diarrhea, bright red blood per rectum, melena, or hematemesis Neurologic:  No visual changes, wkns, changes in mental status. All other systems reviewed and are otherwise negative except as noted above.  Physical Exam/Data    Vitals:   08/13/18 0401 08/13/18 0500 08/13/18 0600 08/13/18 0745  BP:   118/82   Pulse:  (!) 56 (!) 49   Resp:  15 14   Temp: 98.3 F (36.8 C)   97.8 F (36.6 C)  TempSrc: Oral   Oral  SpO2:  96% 92%   Weight: 67 kg     Height: 5\' 5"  (1.651 m)       Intake/Output Summary (Last 24 hours) at 08/13/2018 1130 Last data filed at 08/13/2018 0745 Gross per 24 hour  Intake 0 ml  Output -  Net 0 ml   Filed Weights   08/12/18 1530 08/13/18 0401  Weight: 66.2 kg 67 kg   Body mass index is 24.58 kg/m.   General: Pleasant, Caucasian female appearing in NAD Psych: Normal affect. Neuro: Alert and oriented X  3. Moves all extremities spontaneously. HEENT: Normal  Neck: Supple without bruits or JVD. Lungs:  Resp regular and unlabored, CTA without wheezing or rales. Heart: RRR no s3, s4, or murmurs. Abdomen: Soft, non-tender, non-distended, BS + x 4.  Extremities: No clubbing, cyanosis or edema. DP/PT/Radials 2+ and equal bilaterally.  Telemetry:  Telemetry was personally reviewed and demonstrates: Sinus bradycardia, HR in mid-40's to 50's. No significant pauses.    Labs/Studies     Relevant CV Studies:  Echocardiogram: Pending  Laboratory Data:  Chemistry Recent Labs  Lab 08/12/18 1630  NA 139  K 3.4*  CL 105  CO2 26  GLUCOSE 107*  BUN 16  CREATININE 0.51  CALCIUM 8.8*  GFRNONAA >60  GFRAA >60  ANIONGAP 8    No results for input(s): PROT, ALBUMIN, AST, ALT, ALKPHOS, BILITOT in the last 168 hours. Hematology Recent Labs  Lab 08/12/18 1630  WBC 6.5  RBC 3.82*  HGB 12.0  HCT 37.3  MCV 97.6  MCH 31.4  MCHC 32.2  RDW 12.5  PLT 142*   Cardiac Enzymes Recent Labs  Lab 08/12/18 1630 08/12/18 2215 08/13/18 0401  TROPONINI <0.03 <0.03 <0.03   No results for input(s): TROPIPOC in the last 168 hours.  BNPNo results for input(s): BNP, PROBNP in the last 168 hours.  DDimer No results for input(s): DDIMER in the last 168 hours.  Radiology/Studies:  Dg Chest 2 View  Result Date: 08/12/2018 CLINICAL DATA:  Chest tightness and weakness. EXAM: CHEST - 2 VIEW COMPARISON:  None. FINDINGS: Trachea is midline. Heart is enlarged. Linear atelectasis or scarring in the medial aspects of both lung bases. Lungs are otherwise clear. No pleural fluid. Suspect a hiatal hernia. IMPRESSION: No acute findings. Electronically Signed   By: Leanna Battles M.D.   On: 08/12/2018 16:37     Assessment & Plan    1. Precordial Chest Pain - presented with new-onset chest pressure which started yesterday afternoon with associated dyspnea, diaphoresis, and nausea. Symptoms lasted for an hour  and resolved with administration of ASA. Denies any recurrent pain since. - No known personal history of CAD, HTN, HLD, or Type II DM. Reports a strong family history of CAD with her brother having required stenting at the age of 33 and her father having known CAD as well.   - Initial and cyclic troponin values have been negative. EKG shows sinus bradycardia, HR 50, with no diagnostic ST abnormalities (no prior tracings available for comparison). Echocardiogram is pending. Would anticipate further ischemic evaluation pending echo results. If no significant abnormalities on echo, would plan for [facility]. If noted to have a reduced EF or new WMA, would require invasive evaluation with a catheterization.   2. Sinus Bradycardia - She has been in sinus bradycardia with heart rate in the mid 40's to 50's by review of telemetry. Denies any associated symptoms. Not on any AV nodal blocking agents PTA.  Will check TSH.  3. Hypothyroidism - has been continued on PTA Synthroid. Recheck TSH as outlined above.    For questions or updates, please contact [facility] Please consult [email or web address] for contact info under Cardiology/STEMI.  Signed, Ellsworth Lennox, PA-C 08/13/2018, 11:30 AM Pager: [phone]  Attending note  Patient seen and discussed with PA Iran Ouch, I agree with her her documentation. No known prior cardiac history. Presents with chest pain that started yesterday. Initially pain left shoulder blade, constant and somewhat positional. Later in the day developed pressure/tighntess across entire chest with diaphoresis, SOB. Lasted about 30 minutes. No recurrent symptoms.    Hgb 12 Plt 142 WBC 6.5 K 3.4 Cr 0.51  Trop neg x 3 CXR no acute process EKG sinus brady no acute ischemic changes Echo pending  No evidence of ischemia by EKG or enzymes thus far. We will await echo results, pending results would consider stress testing tomorrow, if abnormal echo would consider cath. WIll  need ischemic evaluation this admissin, echo will help determine whether noninvasive or invasive. Will start diet today, npo tonight at midnight. Asymptomatic sinus brady on  tele, continue to monitor, avoid av nodal agents, check TSH.    Dominga Ferry MD

## 2018-08-13 NOTE — Progress Notes (Signed)
PROGRESS NOTE    Kayla Dean  ZOX:[id number] DOB: [birth year] DOA: 08/12/2018 PCP: Philbert Riser, MD   Brief Narrative:  67 year old with a history of hypothyroidism, coronary artery disease in the family came to the hospital with complains of chest pain.  Patient states 1 day prior to her admission she started feeling left-sided shoulder pain followed by tightness around her chest which felt like a very tight " bra" along with some diaphoresis therefore came to the hospital.  Cardiology was consulted who recommended inpatient work-up starting with echocardiogram followed by stress test versus left heart catheterization.   Assessment & Plan:   Active Problems:   Chest pain   Hypothyroidism  Atypical chest pain - Although patient does not have a personal history, she does have strong family history of CAD.  Cardiac enzymes are negative X3.  EKG showed sinus bradycardia with no acute ST-T changes -Cardiology consulted, echocardiogram ordered-results pending.  Depending on the echocardiogram patient will need stress test versus left heart catheterization. -LDL is 77, will check hemoglobin A1c to further risk stratify -Check TSH  Hypothyroidism -Continue home Synthroid. -TSH-pending  DVT prophylaxis: Subcutaneous Lovenox Code Status: Full code Family Communication: Husband at bedside Disposition Plan: Maintain inpatient stay until cleared by cardiology, further work-up in place getting echocardiogram followed by stress test versus left heart catheterization  Consultants:   Cardiology  Procedures:   None  Antimicrobials:   None   Subjective: Patient states her chest pain is much lower intensity at this time, at worst it is 3/10 in intensity.  He still feels like slight chest tightness but much tolerable.  Review of Systems Otherwise negative except as per HPI, including: General: Denies fever, chills, night sweats or unintended weight loss. Resp: Denies cough,  wheezing, shortness of breath. Cardiac: Denies  palpitations, orthopnea, paroxysmal nocturnal dyspnea. GI: Denies abdominal pain, nausea, vomiting, diarrhea or constipation GU: Denies dysuria, frequency, hesitancy or incontinence MS: Denies muscle aches, joint pain or swelling Neuro: Denies headache, neurologic deficits (focal weakness, numbness, tingling), abnormal gait Psych: Denies anxiety, depression, SI/HI/AVH Skin: Denies new rashes or lesions ID: Denies sick contacts, exotic exposures, travel  Objective: Vitals:   08/13/18 0600 08/13/18 0745 08/13/18 1143 08/13/18 1200  BP: 118/82     Pulse: (!) 49   (!) 51  Resp: 14   17  Temp:  97.8 F (36.6 C) 98.1 F (36.7 C)   TempSrc:  Oral Oral   SpO2: 92%   96%  Weight:      Height:        Intake/Output Summary (Last 24 hours) at 08/13/2018 1332 Last data filed at 08/13/2018 0745 Gross per 24 hour  Intake 0 ml  Output -  Net 0 ml   Filed Weights   08/12/18 1530 08/13/18 0401  Weight: 66.2 kg 67 kg    Examination:  General exam: Appears calm and comfortable  Respiratory system: Clear to auscultation. Respiratory effort normal. Cardiovascular system: S1 & S2 heard, RRR. No JVD, murmurs, rubs, gallops or clicks. No pedal edema. Gastrointestinal system: Abdomen is nondistended, soft and nontender. No organomegaly or masses felt. Normal bowel sounds heard. Central nervous system: Alert and oriented. No focal neurological deficits. Extremities: Symmetric 5 x 5 power. Skin: No rashes, lesions or ulcers Psychiatry: Judgement and insight appear normal. Mood & affect appropriate.   Data Reviewed:   CBC: Recent Labs  Lab 08/12/18 1630  WBC 6.5  NEUTROABS 4.5  HGB 12.0  HCT 37.3  MCV 97.6  PLT 142*   Basic Metabolic Panel: Recent Labs  Lab 08/12/18 1630  NA 139  K 3.4*  CL 105  CO2 26  GLUCOSE 107*  BUN 16  CREATININE 0.51  CALCIUM 8.8*   GFR: Estimated Creatinine Clearance: 60.6 mL/min (by C-G formula  based on SCr of 0.51 mg/dL). Liver Function Tests: No results for input(s): AST, ALT, ALKPHOS, BILITOT, PROT, ALBUMIN in the last 168 hours. No results for input(s): LIPASE, AMYLASE in the last 168 hours. No results for input(s): AMMONIA in the last 168 hours. Coagulation Profile: No results for input(s): INR, PROTIME in the last 168 hours. Cardiac Enzymes: Recent Labs  Lab 08/12/18 1630 08/12/18 2215 08/13/18 0401  TROPONINI <0.03 <0.03 <0.03   BNP (last 3 results) No results for input(s): PROBNP in the last 8760 hours. HbA1C: No results for input(s): HGBA1C in the last 72 hours. CBG: No results for input(s): GLUCAP in the last 168 hours. Lipid Profile: Recent Labs    08/13/18 0401  CHOL 126  HDL 37*  LDLCALC 77  TRIG 60  CHOLHDL 3.4   Thyroid Function Tests: No results for input(s): TSH, T4TOTAL, FREET4, T3FREE, THYROIDAB in the last 72 hours. Anemia Panel: No results for input(s): VITAMINB12, FOLATE, FERRITIN, TIBC, IRON, RETICCTPCT in the last 72 hours. Sepsis Labs: No results for input(s): PROCALCITON, LATICACIDVEN in the last 168 hours.  Recent Results (from the past 240 hour(s))  MRSA PCR Screening     Status: None   Collection Time: 08/13/18  3:51 AM  Result Value Ref Range Status   MRSA by PCR NEGATIVE NEGATIVE Final    Comment:        The GeneXpert MRSA Assay (FDA approved for NASAL specimens only), is one component of a comprehensive MRSA colonization surveillance program. It is not intended to diagnose MRSA infection nor to guide or monitor treatment for MRSA infections. Performed at [facility], [address] [place], [place] [address]          Radiology Studies: Dg Chest 2 View  Result Date: 08/12/2018 CLINICAL DATA:  Chest tightness and weakness. EXAM: CHEST - 2 VIEW COMPARISON:  None. FINDINGS: Trachea is midline. Heart is enlarged. Linear atelectasis or scarring in the medial aspects of both lung bases. Lungs are otherwise clear. No  pleural fluid. Suspect a hiatal hernia. IMPRESSION: No acute findings. Electronically Signed   By: Leanna Battles M.D.   On: 08/12/2018 16:37        Scheduled Meds: . aspirin EC  81 mg Oral Daily  . enoxaparin (LOVENOX) injection  40 mg Subcutaneous Q24H  . levothyroxine  137 mcg Oral Q0600   Continuous Infusions:   LOS: 0 days   Time spent= 25 mins    Ariez Neilan Joline Maxcy, MD [facility] Pager [phone]   If 7PM-7AM, please contact night-coverage [email or web address] Password [email or web address] 08/13/2018, 1:32 PM

## 2018-08-13 NOTE — Progress Notes (Signed)
*  PRELIMINARY RESULTS* Echocardiogram 2D Echocardiogram has been performed.  Kayla Dean 08/13/2018, 1:37 PM

## 2018-08-14 ENCOUNTER — Inpatient Hospital Stay (HOSPITAL_COMMUNITY): Payer: BC Managed Care – PPO

## 2018-08-14 ENCOUNTER — Encounter (HOSPITAL_COMMUNITY): Payer: Self-pay

## 2018-08-14 DIAGNOSIS — K219 Gastro-esophageal reflux disease without esophagitis: Secondary | ICD-10-CM | POA: Diagnosis not present

## 2018-08-14 DIAGNOSIS — R079 Chest pain, unspecified: Secondary | ICD-10-CM

## 2018-08-14 DIAGNOSIS — E039 Hypothyroidism, unspecified: Secondary | ICD-10-CM | POA: Diagnosis not present

## 2018-08-14 LAB — NM MYOCAR MULTI W/SPECT W/WALL MOTION / EF
CHL CUP NUCLEAR SDS: 2
CHL CUP NUCLEAR SRS: 0
CHL CUP NUCLEAR SSS: 2
LHR: 0.37
LV dias vol: 86 mL (ref 46–106)
LV sys vol: 26 mL
Peak HR: 107 {beats}/min
Rest HR: 47 {beats}/min
TID: 0.88

## 2018-08-14 LAB — CBC
HCT: 36.5 % (ref 36.0–46.0)
Hemoglobin: 11.6 g/dL — ABNORMAL LOW (ref 12.0–15.0)
MCH: 30.9 pg (ref 26.0–34.0)
MCHC: 31.8 g/dL (ref 30.0–36.0)
MCV: 97.1 fL (ref 80.0–100.0)
PLATELETS: 119 10*3/uL — AB (ref 150–400)
RBC: 3.76 MIL/uL — ABNORMAL LOW (ref 3.87–5.11)
RDW: 12.4 % (ref 11.5–15.5)
WBC: 3.6 10*3/uL — ABNORMAL LOW (ref 4.0–10.5)
nRBC: 0 % (ref 0.0–0.2)

## 2018-08-14 LAB — BASIC METABOLIC PANEL
Anion gap: 6 (ref 5–15)
BUN: 10 mg/dL (ref 8–23)
CO2: 29 mmol/L (ref 22–32)
CREATININE: 0.52 mg/dL (ref 0.44–1.00)
Calcium: 8.7 mg/dL — ABNORMAL LOW (ref 8.9–10.3)
Chloride: 107 mmol/L (ref 98–111)
GFR calc non Af Amer: >60 mL/min (ref >60)
GLUCOSE: 87 mg/dL (ref 70–99)
Potassium: 3.1 mmol/L — ABNORMAL LOW (ref 3.5–5.1)
Sodium: 142 mmol/L (ref 135–145)

## 2018-08-14 LAB — TSH: TSH: 1.008 u[IU]/mL (ref 0.350–4.500)

## 2018-08-14 LAB — MAGNESIUM: Magnesium: 1.9 mg/dL (ref 1.7–2.4)

## 2018-08-14 LAB — HIV ANTIBODY (ROUTINE TESTING W REFLEX): HIV Screen 4th Generation wRfx: NONREACTIVE

## 2018-08-14 MED ORDER — SODIUM CHLORIDE 0.9% FLUSH
INTRAVENOUS | Status: AC
  Administered 2018-08-14: 10 mL via INTRAVENOUS
  Filled 2018-08-14: qty 10

## 2018-08-14 MED ORDER — POTASSIUM CHLORIDE CRYS ER 20 MEQ PO TBCR
40.0000 meq | EXTENDED_RELEASE_TABLET | Freq: Once | ORAL | Status: AC
  Administered 2018-08-14: 40 meq via ORAL
  Filled 2018-08-14: qty 2

## 2018-08-14 MED ORDER — TECHNETIUM TC 99M TETROFOSMIN IV KIT
10.0000 | PACK | Freq: Once | INTRAVENOUS | Status: AC | PRN
  Administered 2018-08-14: 11 via INTRAVENOUS

## 2018-08-14 MED ORDER — ASPIRIN 81 MG PO TBEC: 81.0000 mg | DELAYED_RELEASE_TABLET | Freq: Every day | ORAL | 1 refills | Status: DC

## 2018-08-14 MED ORDER — OMEPRAZOLE 20 MG PO CPDR: 20.0000 mg | DELAYED_RELEASE_CAPSULE | Freq: Every day | ORAL | 1 refills | Status: DC

## 2018-08-14 MED ORDER — REGADENOSON 0.4 MG/5ML IV SOLN
INTRAVENOUS | Status: AC
  Administered 2018-08-14: 0.4 mg via INTRAVENOUS
  Filled 2018-08-14: qty 5

## 2018-08-14 MED ORDER — TECHNETIUM TC 99M TETROFOSMIN IV KIT
30.0000 | PACK | Freq: Once | INTRAVENOUS | Status: AC | PRN
  Administered 2018-08-14: 31 via INTRAVENOUS

## 2018-08-14 NOTE — Progress Notes (Signed)
Pt discharged home with personal belongings and prescriptions.

## 2018-08-14 NOTE — Progress Notes (Signed)
Patient NPO for testing today. Dr. Selena Batten notified and ordered to hold this mornings Synthroid dose.

## 2018-08-14 NOTE — Progress Notes (Addendum)
Progress Note  Patient Name: Kayla Dean Date of Encounter: 08/14/2018  Primary Cardiologist: New to Dr. Wyline Mood this admission    Subjective   She denies any recurrent chest pain or dyspnea overnight. Evaluated in Nuc Med prior to stress testing. Has been NPO since midnight.   Inpatient Medications    Scheduled Meds: . aspirin EC  81 mg Oral Daily  . enoxaparin (LOVENOX) injection  40 mg Subcutaneous Q24H  . levothyroxine  137 mcg Oral Q0600   Continuous Infusions:  PRN Meds: acetaminophen **OR** acetaminophen, ondansetron **OR** ondansetron (ZOFRAN) IV, polyethylene glycol, technetium tetrofosmin   Vital Signs    Vitals:   08/13/18 1700 08/13/18 1749 08/13/18 2107 08/14/18 0507  BP:   139/87 122/73  Pulse: (!) 57 70 (!) 55 60  Resp: 20 17 16 19   Temp:   98.3 F (36.8 C) 98 F (36.7 C)  TempSrc:   Oral Oral  SpO2: 96% 96% 95% 97%  Weight:      Height:        Intake/Output Summary (Last 24 hours) at 08/14/2018 0739 Last data filed at 08/13/2018 1835 Gross per 24 hour  Intake 480 ml  Output -  Net 480 ml   Filed Weights   08/12/18 1530 08/13/18 0401  Weight: 66.2 kg 67 kg    Telemetry    Sinus bradycardia, HR in mid-40's to 50's with occasional PVC's. Infrequent episodes of ventricular trigeminy. - Personally Reviewed  ECG    Sinus bradycardia, HR 50, with no acute ST or T-wave changes.  - Personally Reviewed  Physical Exam   General: Well developed, well nourished Caucasian female appearing in no acute distress. Head: Normocephalic, atraumatic.  Neck: Supple without bruits, JVD not elevated. Lungs:  Resp regular and unlabored, CTA without wheezing or rales. Heart: RRR, S1, S2, no S3, S4, or murmur; no rub. Abdomen: Soft, non-tender, non-distended with normoactive bowel sounds. No hepatomegaly. No rebound/guarding. No obvious abdominal masses. Extremities: No clubbing, cyanosis, or lower extremity edema. Distal pedal pulses are 2+  bilaterally. Neuro: Alert and oriented X 3. Moves all extremities spontaneously. Psych: Normal affect.  Labs    Chemistry Recent Labs  Lab 08/12/18 1630 08/14/18 0525  NA 139 142  K 3.4* 3.1*  CL 105 107  CO2 26 29  GLUCOSE 107* 87  BUN 16 10  CREATININE 0.51 0.52  CALCIUM 8.8* 8.7*  GFRNONAA >60 >60  GFRAA >60 >60  ANIONGAP 8 6     Hematology Recent Labs  Lab 08/12/18 1630 08/14/18 0525  WBC 6.5 3.6*  RBC 3.82* 3.76*  HGB 12.0 11.6*  HCT 37.3 36.5  MCV 97.6 97.1  MCH 31.4 30.9  MCHC 32.2 31.8  RDW 12.5 12.4  PLT 142* 119*    Cardiac Enzymes Recent Labs  Lab 08/12/18 1630 08/12/18 2215 08/13/18 0401  TROPONINI <0.03 <0.03 <0.03   No results for input(s): TROPIPOC in the last 168 hours.   BNPNo results for input(s): BNP, PROBNP in the last 168 hours.   DDimer No results for input(s): DDIMER in the last 168 hours.   Radiology    Dg Chest 2 View  Result Date: 08/12/2018 CLINICAL DATA:  Chest tightness and weakness. EXAM: CHEST - 2 VIEW COMPARISON:  None. FINDINGS: Trachea is midline. Heart is enlarged. Linear atelectasis or scarring in the medial aspects of both lung bases. Lungs are otherwise clear. No pleural fluid. Suspect a hiatal hernia. IMPRESSION: No acute findings. Electronically Signed   By: Juliette Alcide  Blietz M.D.   On: 08/12/2018 16:37    Cardiac Studies   Echocardiogram: 08/13/2018 Study Conclusions  - Left ventricle: The cavity size was normal. Wall thickness was   increased in a pattern of mild LVH. Systolic function was normal.   The estimated ejection fraction was in the range of 60% to 65%.   Wall motion was normal; there were no regional wall motion   abnormalities. Doppler parameters are consistent with abnormal   left ventricular relaxation (grade 1 diastolic dysfunction). - Aortic valve: There was mild regurgitation. Valve area (VTI):   2.98 cm^2. Valve area (Vmax): 2.26 cm^2. - Left atrium: The atrium was mildly dilated. -  Atrial septum: No defect or patent foramen ovale was identified.  Patient Profile     67 y.o. female w/ PMH of hypothyroidism and family history of CAD who presented to [facility] ED on 08/12/2018 for evaluation of new-onset chest pain.   Assessment & Plan    1. Precordial Chest Pain  - presented with new-onset chest pressure with associated dyspnea, diaphoresis, and nausea which started the day of admission and lasted for an hour. She denies any recurrent pain since admission.  - initial and cyclic troponin values have been negative. No acute EKG changes when compared to prior tracings. Echo shows a preserved EF of 60-65% with Grade 1 DD and no regional WMA.  - presented for 1-day Lexiscan Myoview this morning and tolerated well. Official report pending following stress images. If low-risk, no further cardiac testing planned this admission.   2. Hypothyroidism - TSH 1.008. Continue Synthroid at current dosing.   3. Ventricular Trigeminy - Mg 1.9, K+ low at 3.1. Will replace. Not on BB therapy given asymptomatic baseline bradycardia.    For questions or updates, please contact [facility] Please consult [email or web address] for contact info under Cardiology/STEMI.   Lorri Frederick , PA-C 7:39 AM 08/14/2018 Pager: [phone]  Attending note Patient seen and discussed with PA Iran Ouch, I agree with her documentation. Negative workup for ACS by enzymes and EKG, echo shows normal LV function without WMAs, nuclear stress test without ischemia  No further cardiac workup at this time. We will arrange outpatient follow up, ok for discharge from cardiac standpoint  [facility] will sign off.   Medication Recommendations:  No changes Other recommendations (labs, testing, etc):  None Follow up as an outpatient:  We will arrange f/u 3-4 weeks.    Dina Rich MD

## 2018-08-14 NOTE — Progress Notes (Signed)
Patient's IV removed and site intact.  

## 2018-08-14 NOTE — Discharge Summary (Signed)
Physician Discharge Summary  FRIDA WAHLSTROM NWG:[id number] DOB: 10-28-1950 DOA: 08/12/2018  PCP: Philbert Riser, MD  Admit date: 08/12/2018 Discharge date: 08/14/2018  Time spent: 35 minutes  Recommendations for Outpatient Follow-up:  1. Repeat basic metabolic panel to follow electrolytes and renal function. 2. Reassess blood pressure and if needed consider starting medication   Discharge Diagnoses:  Active Problems:   Chest pain   Hypothyroidism   Gastroesophageal reflux disease elevated BP.  Discharge Condition: Stable and improved.  Patient discharged home with instruction to follow-up with cardiology as instructed and to follow-up with her PCP in 2 weeks.  Diet recommendation: Heart healthy diet  Filed Weights   08/12/18 1530 08/13/18 0401  Weight: 66.2 kg 67 kg    History of present illness:  As per H&P written by Dr. Mariea Clonts on 08/12/18 67 y.o. female with medical history significant for SBO, thyroid disease, chronic back pain who presented to the ED with complaints of sudden onset of chest pain.  Patient was at work today, sitting down, when suddenly she had 10 out of 10 radiating, mid chest pain, with associated diaphoresis, palpitation nausea without vomiting, she is unsure if she had some difficulty breathing.  Pain is described as a tightness/bandlike sensation around her chest. Never smoker.  Family history of coronary artery disease in her brother when he was 5.  No personal or family history of blood clots.  No recent travel no lower extremity pain no swelling. She takes daily meloxicam for arthritis.   ED Course: Tachycardic heart rate 48-55, otherwise stable vitals.  Two-view chest x-ray negative for acute abnormality.  EKG sinus bradycardia rate 50, T wave flattening 3 aVF-NO old EKG to compare.  Troponin unremarkable 0.63.  Hospitalist called to admit for chest pain rule out ACS.  Hospital Course:  1-chest pain: Noncardiac and most likely associated with  reflux disease. -Troponin negative x3, no acute ischemic changes on EKG or telemetry -2D echo with preserved ejection fraction and no wall motion abnormalities. -Stress test negative for acute ischemia. -Continue baby aspirin, lifestyle changes and outpatient follow-up with cardiology service as recommended.  2-GERD -Patient started on Prilosec 20 mg by mouth daily -Advised to minimize the use of NSAIDs.  3-hypothyroidism -Continue Synthroid  4-mild elevation in blood pressure -No prior history of hypertension -Most likely associated with pain during initial admission process -At discharge systolic blood pressure stable in the 130s to 120s -Will recommend outpatient follow-up on her vital signs.  Procedures:  See below for x-ray reports  2D echo: Normal ejection fraction, no wall motion abnormalities.  Nuclear stress test: Negative for acute ischemia.  Consultations:  Cardiology  Discharge Exam: Vitals:   08/13/18 2107 08/14/18 0507  BP: 139/87 122/73  Pulse: (!) 55 60  Resp: 16 19  Temp: 98.3 F (36.8 C) 98 F (36.7 C)  SpO2: 95% 97%    General: Afebrile, no chest pain, no shortness of breath, no nausea, no vomiting. Cardiovascular: S1 and S2, no rubs, no gallops, no JVD. Respiratory: Good air movement bilaterally, no wheezing, no crackles. Abdomen: Soft, nontender, nondistended, positive bowel sounds Extremities: No edema, no cyanosis or clubbing.  Discharge Instructions    Allergies as of 08/14/2018   No Known Allergies     Medication List    STOP taking these medications   meloxicam 15 MG tablet Commonly known as:  MOBIC     TAKE these medications   aspirin 81 MG EC tablet Take 1 tablet (81 mg total) by  mouth daily. Start taking on:  08/15/2018   CALCIUM 600 + D 600-200 MG-UNIT Tabs Generic drug:  Calcium Carb-Cholecalciferol Take 2 tablets by mouth every morning.   ferrous sulfate 325 (65 FE) MG tablet Take 325 mg by mouth every  morning.   levothyroxine 137 MCG tablet Commonly known as:  SYNTHROID, LEVOTHROID Take 137 mcg by mouth daily before breakfast.   multivitamin with minerals Tabs tablet Take 1 tablet by mouth every morning.   omeprazole 20 MG capsule Commonly known as:  PRILOSEC Take 1 capsule (20 mg total) by mouth daily.      No Known Allergies Follow-up Information    Ellsworth Lennox, PA-C Follow up on 09/26/2018.   Specialties:  Physician Assistant, Cardiology Why:  Cardiology Follow-Up on 09/26/2018 at 1:00PM.  Contact information: [address] [place] [address] [phone]        Philbert Riser, MD. Schedule an appointment as soon as possible for a visit in 2 week(s).   Specialty:  Family Medicine Contact information: [address] [place] [address] [phone]           The results of significant diagnostics from this hospitalization (including imaging, microbiology, ancillary and laboratory) are listed below for reference.    Significant Diagnostic Studies: Dg Chest 2 View  Result Date: 08/12/2018 CLINICAL DATA:  Chest tightness and weakness. EXAM: CHEST - 2 VIEW COMPARISON:  None. FINDINGS: Trachea is midline. Heart is enlarged. Linear atelectasis or scarring in the medial aspects of both lung bases. Lungs are otherwise clear. No pleural fluid. Suspect a hiatal hernia. IMPRESSION: No acute findings. Electronically Signed   By: Leanna Battles M.D.   On: 08/12/2018 16:37   Nm Myocar Multi W/spect W/wall Motion / Ef  Result Date: 08/14/2018  There was no ST segment deviation noted during stress.  The study is normal. There are no perfusion defects consistent with prior infarct or ischemia.  The left ventricular ejection fraction is hyperdynamic (>65%).  This is a low risk study.     Microbiology: Recent Results (from the past 240 hour(s))  MRSA PCR Screening     Status: None   Collection Time: 08/13/18  3:51 AM  Result Value Ref Range Status    MRSA by PCR NEGATIVE NEGATIVE Final    Comment:        The GeneXpert MRSA Assay (FDA approved for NASAL specimens only), is one component of a comprehensive MRSA colonization surveillance program. It is not intended to diagnose MRSA infection nor to guide or monitor treatment for MRSA infections. Performed at [facility], [address] [place], [place] [address]      Labs: Basic Metabolic Panel: Recent Labs  Lab 08/12/18 1630 08/14/18 0525  NA 139 142  K 3.4* 3.1*  CL 105 107  CO2 26 29  GLUCOSE 107* 87  BUN 16 10  CREATININE 0.51 0.52  CALCIUM 8.8* 8.7*  MG  --  1.9   CBC: Recent Labs  Lab 08/12/18 1630 08/14/18 0525  WBC 6.5 3.6*  NEUTROABS 4.5  --   HGB 12.0 11.6*  HCT 37.3 36.5  MCV 97.6 97.1  PLT 142* 119*   Cardiac Enzymes: Recent Labs  Lab 08/12/18 1630 08/12/18 2215 08/13/18 0401  TROPONINI <0.03 <0.03 <0.03    Signed:  Vassie Loll MD.  [facility] 08/14/2018, 12:06 PM

## 2018-09-09 ENCOUNTER — Ambulatory Visit (HOSPITAL_COMMUNITY)
Admission: RE | Admit: 2018-09-09 | Discharge: 2018-09-09 | Disposition: A | Discharge status: Home / Self Care | Payer: BC Managed Care – PPO | Source: Ambulatory Visit | Attending: Emergency Medicine | Admitting: Emergency Medicine

## 2018-09-09 DIAGNOSIS — Z1231 Encounter for screening mammogram for malignant neoplasm of breast: Secondary | ICD-10-CM | POA: Insufficient documentation

## 2018-09-26 ENCOUNTER — Ambulatory Visit: Payer: Self-pay | Admitting: Student

## 2019-04-17 IMAGING — MG DIGITAL SCREENING BILATERAL MAMMOGRAM WITH TOMO AND CAD
8 series · 8 of 24 positions shown · non-contrast
Comparison: Previous exam(s).

CLINICAL DATA: Screening.

EXAM:
DIGITAL SCREENING BILATERAL MAMMOGRAM WITH TOMO AND CAD

[R MLO synth-2D]
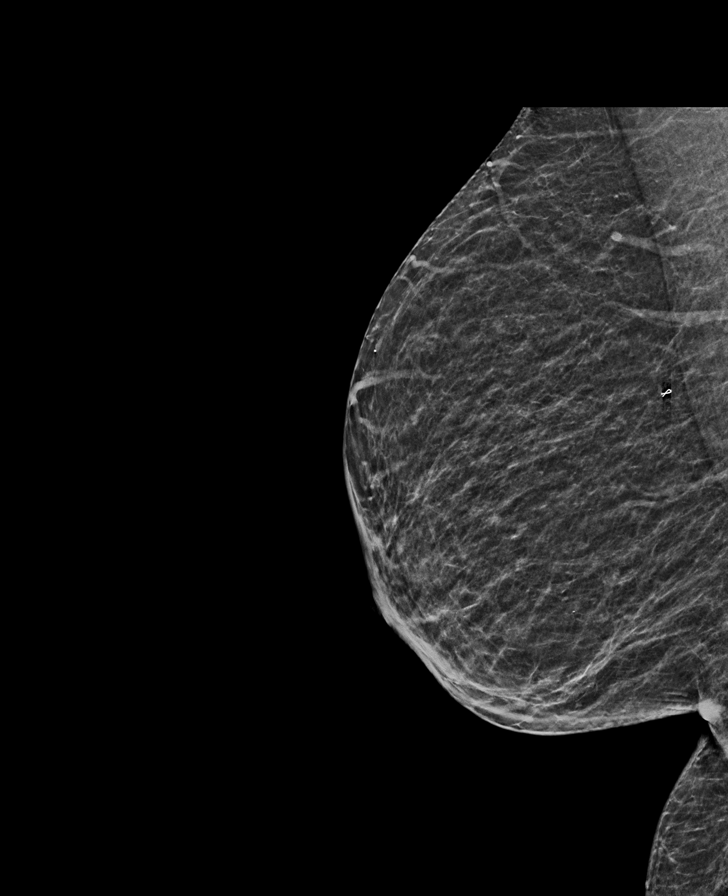

[L MLO synth-2D]
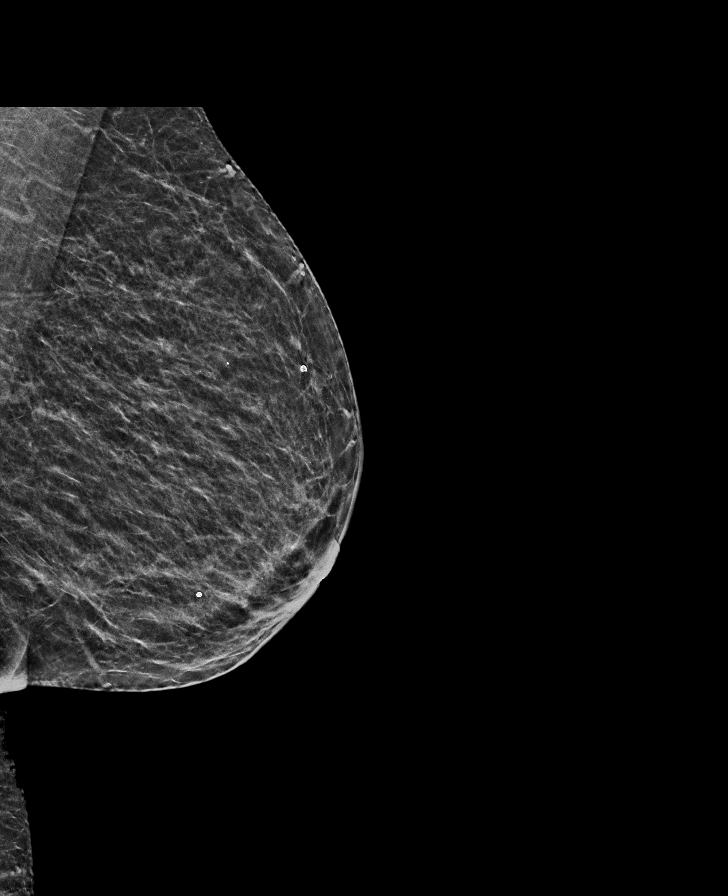

[L CC synth-2D]
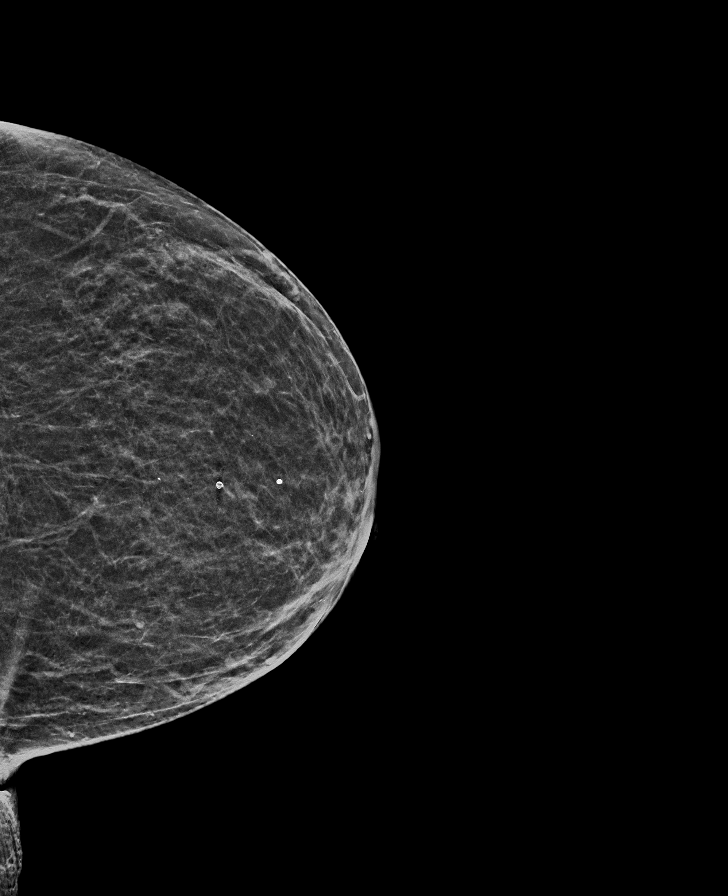

[R CC synth-2D]
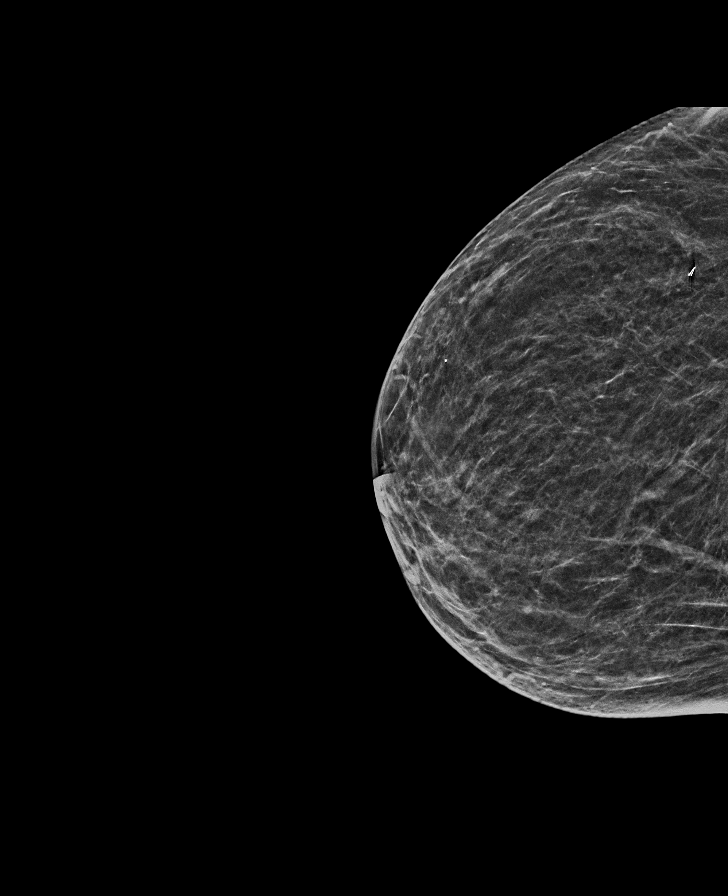

[R MLO tomo · tomo slice 25/48.0]
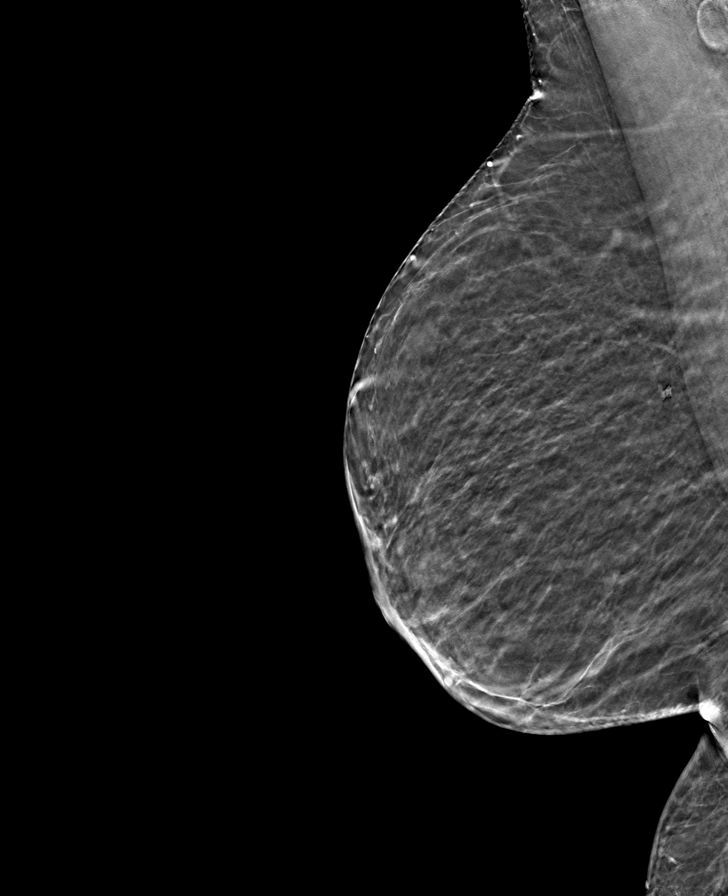

[R CC tomo · tomo slice 26/51.0]
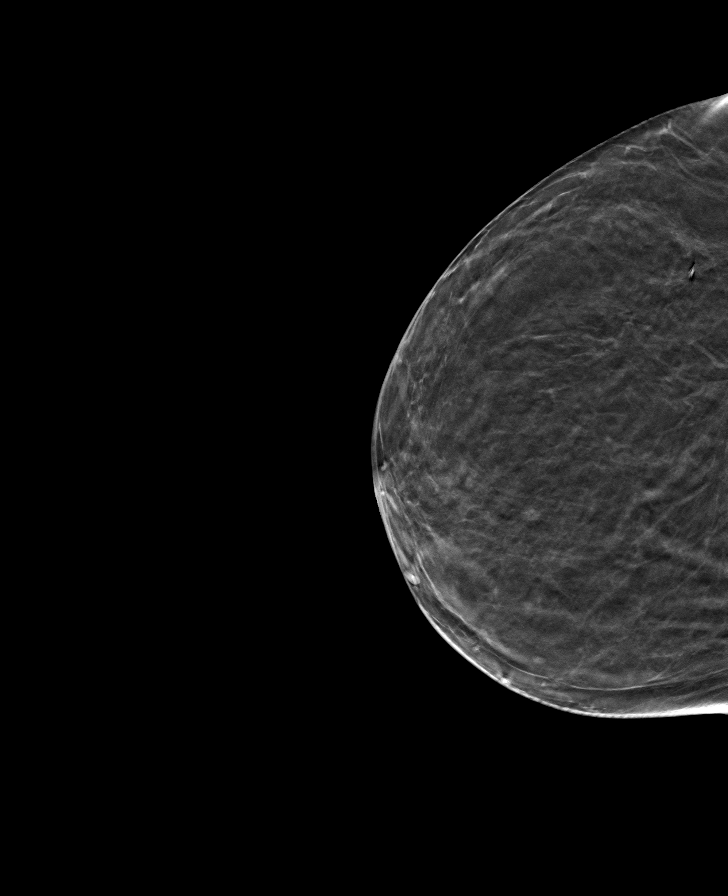

[L MLO tomo · tomo slice 25/49.0]
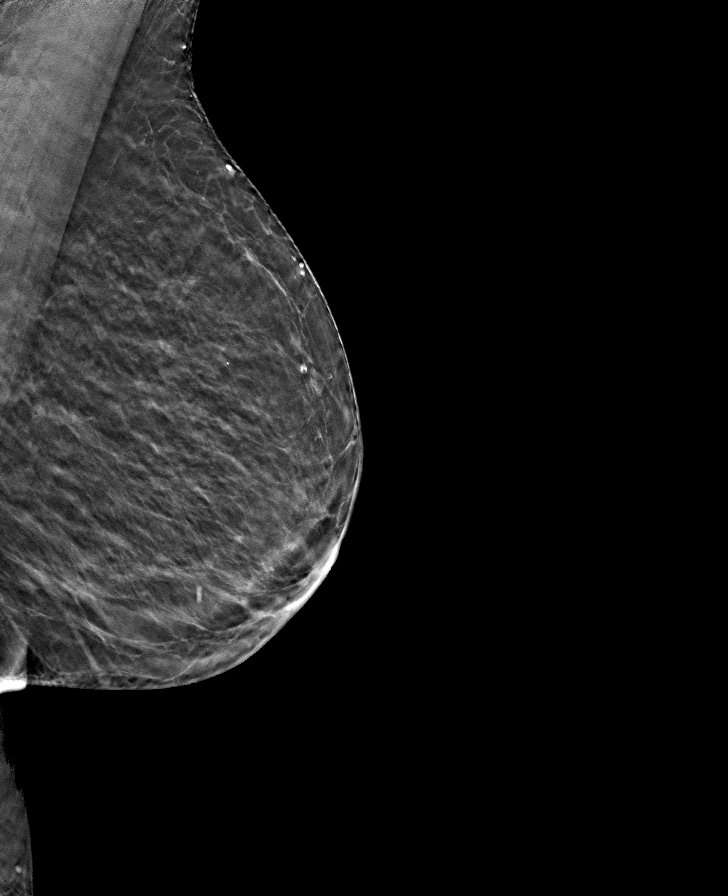

[L CC tomo · tomo slice 25/48.0]
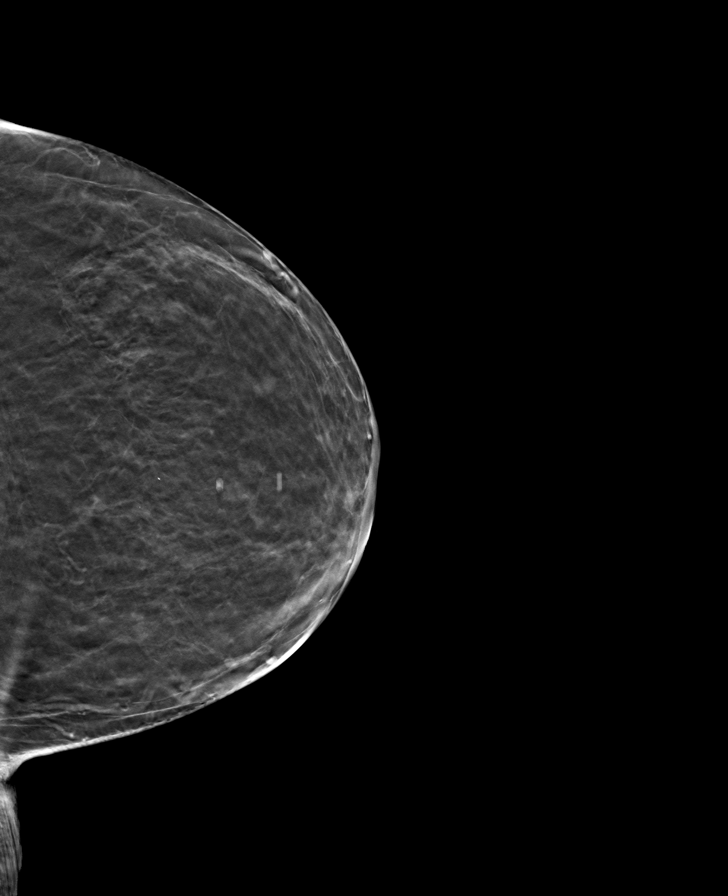

[8 of 24 positions shown; findings below may reference images not displayed]

ACR Breast Density Category b: There are scattered areas of
fibroglandular density.
FINDINGS: There are no findings suspicious for malignancy. Images were
processed with CAD.
IMPRESSION: No mammographic evidence of malignancy. A result letter of this
screening mammogram will be mailed directly to the patient.

RECOMMENDATION:
Screening mammogram in one year. (Code:[id number])

BI-RADS CATEGORY  1: Negative.

## 2019-09-01 ENCOUNTER — Other Ambulatory Visit (HOSPITAL_COMMUNITY): Payer: Self-pay | Admitting: Emergency Medicine

## 2019-09-01 DIAGNOSIS — Z1231 Encounter for screening mammogram for malignant neoplasm of breast: Secondary | ICD-10-CM

## 2019-09-17 ENCOUNTER — Ambulatory Visit (HOSPITAL_COMMUNITY)
Admission: RE | Admit: 2019-09-17 | Discharge: 2019-09-17 | Disposition: A | Discharge status: Home / Self Care | Payer: Medicare Other | Source: Ambulatory Visit | Attending: Emergency Medicine | Admitting: Emergency Medicine

## 2019-09-17 ENCOUNTER — Other Ambulatory Visit: Payer: Self-pay

## 2019-09-17 DIAGNOSIS — Z1231 Encounter for screening mammogram for malignant neoplasm of breast: Secondary | ICD-10-CM | POA: Diagnosis present

## 2020-08-05 ENCOUNTER — Other Ambulatory Visit (HOSPITAL_COMMUNITY): Payer: Self-pay | Admitting: Emergency Medicine

## 2020-08-05 DIAGNOSIS — Z1231 Encounter for screening mammogram for malignant neoplasm of breast: Secondary | ICD-10-CM

## 2020-08-26 ENCOUNTER — Ambulatory Visit (HOSPITAL_COMMUNITY): Payer: Medicare Other

## 2020-08-27 ENCOUNTER — Ambulatory Visit (HOSPITAL_COMMUNITY): Payer: Medicare Other

## 2020-09-20 ENCOUNTER — Other Ambulatory Visit: Payer: Self-pay

## 2020-09-20 ENCOUNTER — Ambulatory Visit (HOSPITAL_COMMUNITY)
Admission: RE | Admit: 2020-09-20 | Discharge: 2020-09-20 | Disposition: A | Discharge status: Home / Self Care | Payer: Medicare Other | Source: Ambulatory Visit | Attending: Emergency Medicine | Admitting: Emergency Medicine

## 2020-09-20 DIAGNOSIS — Z1231 Encounter for screening mammogram for malignant neoplasm of breast: Secondary | ICD-10-CM | POA: Diagnosis present

## 2021-09-20 ENCOUNTER — Other Ambulatory Visit (HOSPITAL_COMMUNITY): Payer: Self-pay | Admitting: Emergency Medicine

## 2021-09-20 DIAGNOSIS — Z1231 Encounter for screening mammogram for malignant neoplasm of breast: Secondary | ICD-10-CM

## 2021-09-26 ENCOUNTER — Ambulatory Visit (HOSPITAL_COMMUNITY)
Admission: RE | Admit: 2021-09-26 | Discharge: 2021-09-26 | Disposition: A | Discharge status: Home / Self Care | Payer: Medicare Other | Source: Ambulatory Visit | Attending: Emergency Medicine | Admitting: Emergency Medicine

## 2021-09-26 ENCOUNTER — Other Ambulatory Visit: Payer: Self-pay

## 2021-09-26 DIAGNOSIS — Z1231 Encounter for screening mammogram for malignant neoplasm of breast: Secondary | ICD-10-CM | POA: Diagnosis not present

## 2022-09-11 ENCOUNTER — Other Ambulatory Visit (HOSPITAL_COMMUNITY): Payer: Self-pay | Admitting: Emergency Medicine

## 2022-09-11 DIAGNOSIS — Z1231 Encounter for screening mammogram for malignant neoplasm of breast: Secondary | ICD-10-CM

## 2022-09-15 ENCOUNTER — Emergency Department (HOSPITAL_COMMUNITY): Payer: Medicare Other

## 2022-09-15 ENCOUNTER — Emergency Department (HOSPITAL_COMMUNITY)
Admission: EM | Admit: 2022-09-15 | Discharge: 2022-09-15 | Disposition: A | Discharge status: Home / Self Care | Payer: Medicare Other | Attending: Emergency Medicine | Admitting: Emergency Medicine

## 2022-09-15 ENCOUNTER — Encounter (HOSPITAL_COMMUNITY): Payer: Self-pay | Admitting: Emergency Medicine

## 2022-09-15 ENCOUNTER — Other Ambulatory Visit: Payer: Self-pay

## 2022-09-15 DIAGNOSIS — R0789 Other chest pain: Secondary | ICD-10-CM | POA: Diagnosis present

## 2022-09-15 DIAGNOSIS — E039 Hypothyroidism, unspecified: Secondary | ICD-10-CM | POA: Insufficient documentation

## 2022-09-15 DIAGNOSIS — Z7982 Long term (current) use of aspirin: Secondary | ICD-10-CM | POA: Insufficient documentation

## 2022-09-15 DIAGNOSIS — I7121 Aneurysm of the ascending aorta, without rupture: Secondary | ICD-10-CM | POA: Diagnosis not present

## 2022-09-15 DIAGNOSIS — R079 Chest pain, unspecified: Secondary | ICD-10-CM

## 2022-09-15 DIAGNOSIS — K449 Diaphragmatic hernia without obstruction or gangrene: Secondary | ICD-10-CM | POA: Insufficient documentation

## 2022-09-15 HISTORY — DX: Bariatric surgery status: Z98.84

## 2022-09-15 LAB — CBC
HCT: 39.8 % (ref 36.0–46.0)
Hemoglobin: 13.1 g/dL (ref 12.0–15.0)
MCH: 31 pg (ref 26.0–34.0)
MCHC: 32.9 g/dL (ref 30.0–36.0)
MCV: 94.1 fL (ref 80.0–100.0)
Platelets: 158 10*3/uL (ref 150–400)
RBC: 4.23 MIL/uL (ref 3.87–5.11)
RDW: 13.2 % (ref 11.5–15.5)
WBC: 5.3 10*3/uL (ref 4.0–10.5)
nRBC: 0 % (ref 0.0–0.2)

## 2022-09-15 LAB — BASIC METABOLIC PANEL
Anion gap: 10 (ref 5–15)
BUN: 11 mg/dL (ref 8–23)
CO2: 25 mmol/L (ref 22–32)
Calcium: 9.2 mg/dL (ref 8.9–10.3)
Chloride: 105 mmol/L (ref 98–111)
Creatinine, Ser: 0.55 mg/dL (ref 0.44–1.00)
GFR, Estimated: >60 mL/min (ref >60)
Glucose, Bld: 101 mg/dL — ABNORMAL HIGH (ref 70–99)
Potassium: 3.7 mmol/L (ref 3.5–5.1)
Sodium: 140 mmol/L (ref 135–145)

## 2022-09-15 LAB — LIPASE, BLOOD: Lipase: 26 U/L (ref 11–51)

## 2022-09-15 LAB — TROPONIN I (HIGH SENSITIVITY)
Troponin I (High Sensitivity): 3 ng/L (ref <18)
Troponin I (High Sensitivity): 3 ng/L (ref <18)

## 2022-09-15 MED ORDER — IOHEXOL 350 MG/ML SOLN
75.0000 mL | Freq: Once | INTRAVENOUS | Status: AC | PRN
  Administered 2022-09-15: 75 mL via INTRAVENOUS

## 2022-09-15 NOTE — ED Triage Notes (Signed)
Pt c/o right side cp radiating into right shoulder and into back that is sharp. Worse with deep breath. C/o sob. Denies n/v/d. Pt is non diaphoretic. Color wnl. Ambulatory. Nad at this time

## 2022-09-15 NOTE — ED Notes (Addendum)
Blood pressure in right arm is 159/97 (map of 115)  Blood pressure in left arm is 160/101 (map of 119)  Nurse and PA notified.

## 2022-09-15 NOTE — ED Notes (Signed)
Pt SpO2 while ambulating around the nurses station was 95%--PA-C made aware

## 2022-09-15 NOTE — Discharge Instructions (Addendum)
You were seen in the ER for evaluation of your chest pain, shoulder pain, and back pain. Your labs and EKG were reassuring. Your imaging did show an aneurysm in your aorta, I would like for you to follow up with your PCP about this as you will need surveillance of this. Additionally, I have sent in a referral for a cardiologist that I would like you to follow-up with.  If you have not heard back within next few days, please call to schedule an appointment.  Additionally, it shows that you have a hiatal hernia which could be from something or pain.  Again, like for you to mention this with your primary care doctor for further surveillance.  You can take Tylenol 1000 mg every 6 hours and/or ibuprofen 600 mg every 6 hours as needed for pain.  If you have any worsening pain, shortness of breath, palpitations, nausea, vomiting, coughing up any blood, fevers, lightheadedness, sweatiness, please return to the nearest emergency room for evaluation.  Otherwise, if you have any concerns, new or worsening symptoms, please return to the nearest emergency room for evaluation.  Contact a doctor if: Your chest pain does not go away. You feel depressed. You have a fever. Get help right away if: Your chest pain is worse. You have a cough that gets worse, or you cough up blood. You have very bad (severe) pain in your belly (abdomen). You pass out (faint). You have either of these for no clear reason: Sudden chest discomfort. Sudden discomfort in your arms, back, neck, or jaw. You have shortness of breath at any time. You suddenly start to sweat, or your skin gets clammy. You feel sick to your stomach (nauseous). You throw up (vomit). You suddenly feel lightheaded or dizzy. You feel very weak or tired. Your heart starts to beat fast, or it feels like it is skipping beats. These symptoms may be an emergency. Do not wait to see if the symptoms will go away. Get medical help right away. Call your local emergency  services (911 in the [place] Do not drive yourself to the hospital.

## 2022-09-15 NOTE — ED Provider Notes (Addendum)
[facility] EMERGENCY DEPARTMENT Provider Note   CSN: [id number] Arrival date & time: 09/15/22  1130     History Chief Complaint  Patient presents with   Chest Pain    Kayla Dean is a 71 y.o. female with h/o hypothyroidism, arthritis, hiatal hernia, presents emerged from today for evaluation of right-sided chest pain that radiates to the back and down her right arm since she woke up today around 0530.  She reports the pain started out as a 10 out of 10 and is now improved to a 6 out of 10.  She reports some occasional palpitations but experiencing these for a while.  Denies any shortness of breath, nausea, vomiting, abdominal pain, fevers, cough, diaphoresis, or lightheadedness.  She reports the pain is worsened with some movements or with deep breaths.  Denies any trauma to the area.  Denies any long travel or exogenous hormone use.  Denies any personal history of cancer or blood clots in the past.  She takes Synthroid and vitamins.  No known drug allergies.  Denies any tobacco, EtOH, also drug use ever.   Chest Pain Associated symptoms: back pain and palpitations   Associated symptoms: no abdominal pain, no diaphoresis, no fever, no nausea, no shortness of breath and no vomiting        Home Medications Prior to Admission medications   Medication Sig Start Date End Date Taking? Authorizing Provider  aspirin EC 81 MG EC tablet Take 1 tablet (81 mg total) by mouth daily. 08/15/18   Vassie Loll, MD  Calcium Carb-Cholecalciferol (CALCIUM 600 + D) 600-200 MG-UNIT TABS Take 2 tablets by mouth every morning.    [provider]  ferrous sulfate 325 (65 FE) MG tablet Take 325 mg by mouth every morning.    [provider]  levothyroxine (SYNTHROID, LEVOTHROID) 137 MCG tablet Take 137 mcg by mouth daily before breakfast.    [provider]  Multiple Vitamin (MULTIVITAMIN WITH MINERALS) TABS tablet Take 1 tablet by mouth every morning.    [provider]  omeprazole (PRILOSEC) 20 MG capsule Take 1 capsule (20 mg total) by mouth daily. 08/14/18 08/14/19  Vassie Loll, MD      Allergies    Patient has no known allergies.    Review of Systems   Review of Systems  Constitutional:  Negative for chills, diaphoresis and fever.  Respiratory:  Negative for shortness of breath.   Cardiovascular:  Positive for chest pain and palpitations.  Gastrointestinal:  Negative for abdominal pain, nausea and vomiting.  Genitourinary:  Negative for dysuria and hematuria.  Musculoskeletal:  Positive for back pain.  Neurological:  Negative for light-headedness.    Physical Exam Updated Vital Signs BP (!) 159/125   Pulse 60   Temp 98.4 F (36.9 C) (Oral)   Resp (!) 21   SpO2 99%  Physical Exam Constitutional:      General: She is not in acute distress.    Appearance: Normal appearance. She is not ill-appearing or toxic-appearing.  HENT:     Head: Normocephalic and atraumatic.  Eyes:     General: No scleral icterus. Cardiovascular:     Rate and Rhythm: Regular rhythm. Bradycardia present.     Pulses:          Radial pulses are 2+ on the right side and 2+ on the left side.  Pulmonary:     Effort: Pulmonary effort is normal. No respiratory distress.     Breath sounds: Normal breath sounds. No  decreased breath sounds.     Comments: CTAB. Satting well on RA without any increase work of breathing. Speaking in full sentences with ease. Chest pain worse with inspiration. Chest:     Chest wall: No deformity, tenderness or crepitus.  Abdominal:     General: Abdomen is flat. Bowel sounds are normal.     Palpations: Abdomen is soft.     Tenderness: There is no abdominal tenderness. There is no guarding or rebound.  Musculoskeletal:        General: No deformity.     Cervical back: Normal range of motion.     Right lower leg: No edema.     Left lower leg: No edema.  Skin:    General: Skin is warm and dry.  Neurological:     General: No focal  deficit present.     Mental Status: She is alert. Mental status is at baseline.     ED Results / Procedures / Treatments   Labs (all labs ordered are listed, but only abnormal results are displayed) Labs Reviewed  BASIC METABOLIC PANEL - Abnormal; Notable for the following components:      Result Value   Glucose, Bld 101 (*)    All other components within normal limits  CBC  TROPONIN I (HIGH SENSITIVITY)  TROPONIN I (HIGH SENSITIVITY)    EKG None  Radiology CT Angio Chest PE W and/or Wo Contrast  Result Date: 09/15/2022 CLINICAL DATA:  Right chest pain radiating to the right shoulder in back, pleuritic. Shortness of breath. EXAM: CT ANGIOGRAPHY CHEST WITH CONTRAST TECHNIQUE: Multidetector CT imaging of the chest was performed using the standard protocol during bolus administration of intravenous contrast. Multiplanar CT image reconstructions and MIPs were obtained to evaluate the vascular anatomy. RADIATION DOSE REDUCTION: This exam was performed according to the departmental dose-optimization program which includes automated exposure control, adjustment of the mA and/or kV according to patient size and/or use of iterative reconstruction technique. CONTRAST:  75mL OMNIPAQUE IOHEXOL 350 MG/ML SOLN COMPARISON:  Chest radiograph 09/15/2022 FINDINGS: Cardiovascular: No filling defect is identified in the pulmonary arterial tree to suggest pulmonary embolus. Aortic and branch vessel atherosclerotic vascular disease. Ascending aortic aneurysm 4.0 cm in diameter. Mild cardiomegaly. Mediastinum/Nodes: Moderate-sized hiatal hernia. Lungs/Pleura: Mild atelectasis in both lung bases along the hemidiaphragms. Mild central airway thickening. Upper Abdomen: Postoperative findings in the stomach. Musculoskeletal: Mild grade 1 degenerative retrolisthesis at T12-L1. Review of the MIP images confirms the above findings. IMPRESSION: 1. No filling defect is identified in the pulmonary arterial tree to suggest  pulmonary embolus. 2. Mild atelectasis in both lung bases along the hemidiaphragms. 3. Moderate-sized hiatal hernia. 4. Mild cardiomegaly. 5. Ascending aortic aneurysm 4.0 cm in diameter. Recommend annual imaging followup by CTA or MRA. This recommendation follows 2010 ACCF/AHA/AATS/ACR/ASA/SCA/SCAI/SIR/STS/SVM Guidelines for the Diagnosis and Management of Patients with Thoracic Aortic Disease. Circulation. 2010; 121: Z610-R604. Aortic aneurysm NOS (ICD10-I71.9) 6. Mild central airway thickening is present, suggesting bronchitis or reactive airways disease. 7. Mild grade 1 degenerative retrolisthesis at T12-L1. 8. Aortic atherosclerosis. Aortic Atherosclerosis (ICD10-I70.0). Electronically Signed   By: Gaylyn Rong M.D.   On: 09/15/2022 15:12   DG Chest Port 1 View  Result Date: 09/15/2022 CLINICAL DATA:  Right-sided chest pain radiating into the right shoulder and back. Increased pain with deep breath. EXAM: PORTABLE CHEST 1 VIEW COMPARISON:  Radiographs 08/12/2018. FINDINGS: 1208 hours. The heart size and mediastinal contours are stable with mild cardiomegaly and aortic tortuosity. There are lower lung volumes with  resulting mild bibasilar atelectasis and a possible small left pleural effusion. No evidence of right-sided pleural effusion, pneumothorax or acute osseous abnormality. Mild degenerative changes in the spine. Telemetry leads overlie the chest. IMPRESSION: Lower lung volumes with resulting mild bibasilar atelectasis and possible small left pleural effusion. Electronically Signed   By: Carey Bullocks M.D.   On: 09/15/2022 12:25    Procedures Procedures   Medications Ordered in ED Medications  iohexol (OMNIPAQUE) 350 MG/ML injection 75 mL (75 mLs Intravenous Contrast Given 09/15/22 1447)    ED Course/ Medical Decision Making/ A&P                           Medical Decision Making Amount and/or Complexity of Data Reviewed Labs: ordered. Radiology: ordered.  Risk Prescription  drug management.   71 year old female presents the emergency room today for evaluation of chest pain worsened with inspiration that has been gradually improving.  Differential diagnosis includes was limited to ACS, arrhythmia, PE, pneumonia, pneumothorax, dissection, aneurysm, bronchitis, costochondritis, anxiety, musculoskeletal.  Vital signs show slightly elevated blood pressure 149/96, mild bradycardia, otherwise afebrile, satting well on room air without increased work of breathing.  Physical exam as noted above.  Given the patient has some pleuritic chest pain with an unremarkable 1 view chest x-ray, will proceed with CT angio to rule out any pulmonary embolism or hidden pneumonia.  I independently reviewed and interpreted the patient's labs.  BMP shows glucose at 101 otherwise no electrolyte abnormality.  CBC without leukocytosis or anemia.  Troponin initially at 3 with repeat of 3.  Lipase shows 26, normal.  EKG reviewed and interpreted by my attending and shows sinus rhythm, borderline left axis deviation.  CTA shows 1. No filling defect is identified in the pulmonary arterial tree to suggest pulmonary embolus. 2. Mild atelectasis in both lung bases along the hemidiaphragms. 3. Moderate-sized hiatal hernia. 4. Mild cardiomegaly. 5. Ascending aortic aneurysm 4.0 cm in diameter. Recommend annual imaging followup by CTA or MRA. This recommendation follows 2010 ACCF/AHA/AATS/ACR/ASA/SCA/SCAI/SIR/STS/SVM Guidelines for the Diagnosis and Management of Patients with Thoracic Aortic Disease. Circulation. 2010; 121: Z308-M578. Aortic aneurysm NOS (ICD10-I71.9) 6. Mild central airway thickening is present, suggesting bronchitis or reactive airways disease. 7. Mild grade 1 degenerative retrolisthesis at T12-L1. 8. Aortic atherosclerosis.   Pulse oximetry while ambulating was 95%.  Patient tolerated well.  Through epic search, I do not see any previous mention of the AAA, will have her follow up with her  PCP for routine screening.   BP is symmetric in bilateral arms. Doubt an dissection at this time.  Unsure of what is causing this patient's chest pain, however she has normal troponins, reassuring EKG, no pulmonary embolism seen.  There is a ascending aortic aneurysm measuring 4.0 cm seen.  Cannot see any previous scans or mention of this.  Labs are overall unremarkable.  It does show some mild central airway thickening present suggesting bronchitis or RAD, however patient does not have any URI symptoms. She does have a moderate sized hiatal hernia which could be some of her pain. Will have her follow up with PCP about this. Will refer her to cardiology for further follow up as well.   We discussed the lab and imaging results with the patient at bedside. Discussed the new imaging with the patient and the need to follow up with her PCP. We discussed strict return precautions and red flag symptoms. The patient verbalized understanding and agrees to the  plan. The patient is stable and being discharged home in good condition.   I discussed this case with my attending physician who cosigned this note including patient's presenting symptoms, physical exam, and planned diagnostics and interventions. Attending physician stated agreement with plan or made changes to plan which were implemented.   Final Clinical Impression(s) / ED Diagnoses Final diagnoses:  Aneurysm of ascending aorta without rupture (HCC)  Nonspecific chest pain  Hiatal hernia  Chest pain, unspecified type    Rx / DC Orders ED Discharge Orders          Ordered    Ambulatory referral to Cardiology       Comments: If you have not heard from the Cardiology office within the next 72 hours please call [phone].   09/15/22 1705              Achille Rich, PA-C 09/15/22 1709    Eber Hong, MD 09/15/22 1723    Achille Rich, PA-C 09/15/22 1731    Eber Hong, MD 09/18/22 [phone]

## 2022-09-15 NOTE — ED Notes (Signed)
Patient transported to CT 

## 2022-09-29 ENCOUNTER — Ambulatory Visit (HOSPITAL_COMMUNITY): Payer: Medicare Other

## 2022-10-05 ENCOUNTER — Ambulatory Visit (HOSPITAL_COMMUNITY)
Admission: RE | Admit: 2022-10-05 | Discharge: 2022-10-05 | Disposition: A | Discharge status: Home / Self Care | Payer: Medicare Other | Source: Ambulatory Visit | Attending: Emergency Medicine | Admitting: Emergency Medicine

## 2022-10-05 DIAGNOSIS — Z1231 Encounter for screening mammogram for malignant neoplasm of breast: Secondary | ICD-10-CM | POA: Diagnosis present

## 2022-11-14 ENCOUNTER — Encounter: Payer: Self-pay | Admitting: Cardiology

## 2022-11-14 NOTE — Progress Notes (Unsigned)
Cardiology Office Note  Date: 11/15/2022   ID: Kayla Dean, DOB 01-31-1951, MRN [id number]  PCP:  Kayla Schwalbe, MD  Cardiologist:  Kayla Lesches, MD Electrophysiologist:  None   Chief Complaint  Patient presents with   Chest Pain    History of Present Illness: Kayla Dean is a 72 y.o. female referred for cardiology consultation by Kayla Dean after ER visit in December 2023 with chest discomfort.  I reviewed the available records.  She ruled out for ACS with normal high-sensitivity troponin I levels, ECG showed no acute ST segment changes.  Chest CTA did not demonstrate pulmonary embolus, incidentally noted a moderate-sized hiatal hernia, mild cardiomegaly, and ascending thoracic aortic dimension of 4 cm.  She states that she has been experiencing right sided thoracic discomfort, also associated arm discomfort.  Describes this as a tightness, it may last for an hour at a time and has no specific precipitant.  This has been going on for a while, but worse since December 2023.  Records indicate hospital evaluation by Dr. Harl Dean back in 2019 for assessment of chest pain.  She ruled out for ACS at that time as well and underwent a Lexiscan Myoview that was low risk.  She also mentions intermittent sense of palpitations, can occur daily at times and not associated with the symptoms above.  She has had no unexplained syncope.  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  Past Medical History:  Diagnosis Date   Arthritis    H/O gastric bypass    Hiatal hernia    Hypothyroidism    Small bowel obstruction (HCC)     Past Surgical History:  Procedure Laterality Date   ABDOMINAL HYSTERECTOMY     CHOLECYSTECTOMY     COLON SURGERY     HERNIA REPAIR      Current Outpatient Medications  Medication Sig Dispense Refill   aspirin EC 81 MG EC tablet Take 1 tablet (81 mg total) by mouth daily. (Patient taking differently: Take 81 mg by mouth. "Very seldom") 30  tablet 1   Calcium Carb-Cholecalciferol (CALCIUM 600 + D) 600-200 MG-UNIT TABS Take 2 tablets by mouth every morning.     ferrous sulfate 325 (65 FE) MG tablet Take 325 mg by mouth every morning.     levothyroxine (SYNTHROID, LEVOTHROID) 137 MCG tablet Take 137 mcg by mouth daily before breakfast.     metoprolol tartrate (LOPRESSOR) 50 MG tablet Take 1 tablet by mouth 2 hours prior to CT Scan 1 tablet 0   Multiple Vitamin (MULTIVITAMIN WITH MINERALS) TABS tablet Take 1 tablet by mouth every morning.     No current facility-administered medications for this visit.   Allergies:  Patient has no known allergies.   Social History: The patient  reports that she has never smoked. She has never used smokeless tobacco. She reports that she does not drink alcohol and does not use drugs.   Family History: The patient's family history includes CAD in her brother and father.   ROS: No orthopnea or PND.  Physical Exam: VS:  BP 126/84   Pulse 62   Ht 5\' 5"  (1.651 m)   Wt 146 lb (66.2 kg)   BMI 24.30 kg/m , BMI Body mass index is 24.3 kg/m.  Wt Readings from Last 3 Encounters:  11/15/22 146 lb (66.2 kg)  08/13/18 147 lb 11.3 oz (67 kg)  05/16/17 198 lb (89.8 kg)    General: Patient appears comfortable at rest. HEENT: Conjunctiva  and lids normal. Neck: Supple, no elevated JVP or carotid bruits, no thyromegaly. Lungs: Clear to auscultation, nonlabored breathing at rest. Cardiac: Regular rate and rhythm, no S3 or significant systolic murmur, no pericardial rub. Abdomen: Soft, bowel sounds present. Extremities: No pitting edema, distal pulses 2+. Skin: Warm and dry. Musculoskeletal: No kyphosis. Neuropsychiatric: Alert and oriented x3, affect grossly appropriate.  ECG:  An ECG dated 09/15/2022 was personally reviewed today and demonstrated:  Sinus rhythm with leftward axis.  Recent Labwork: 09/15/2022: BUN 11; Creatinine, Ser 0.55; Hemoglobin 13.1; Platelets 158; Potassium 3.7; Sodium 140      Component Value Date/Time   CHOL 126 08/13/2018 0401   TRIG 60 08/13/2018 0401   HDL 37 (L) 08/13/2018 0401   CHOLHDL 3.4 08/13/2018 0401   VLDL 12 08/13/2018 0401   LDLCALC 77 08/13/2018 0401   Other Studies Reviewed Today:  [facility] Adam Myoview 08/14/2018: There was no ST segment deviation noted during stress. The study is normal. There are no perfusion defects consistent with prior infarct or ischemia. The left ventricular ejection fraction is hyperdynamic (>65%). This is a low risk study.  Chest CTA 09/15/2022: IMPRESSION: 1. No filling defect is identified in the pulmonary arterial tree to suggest pulmonary embolus. 2. Mild atelectasis in both lung bases along the hemidiaphragms. 3. Moderate-sized hiatal hernia. 4. Mild cardiomegaly. 5. Ascending aortic aneurysm 4.0 cm in diameter. Recommend annual imaging followup by CTA or MRA. This recommendation follows 2010 ACCF/AHA/AATS/ACR/ASA/SCA/SCAI/SIR/STS/SVM Guidelines for the Diagnosis and Management of Patients with Thoracic Aortic Disease. Circulation. 2010; 121: I627-O350. Aortic aneurysm NOS (ICD10-I71.9) 6. Mild central airway thickening is present, suggesting bronchitis or reactive airways disease. 7. Mild grade 1 degenerative retrolisthesis at T12-L1. 8. Aortic atherosclerosis.  Assessment and Plan:  1.  Recurring right-sided thoracic and arm discomfort concerning for angina pectoris although without specific precipitant.  Patient ruled out for ACS during ER evaluation with these symptoms in December 2023.  She does have aortic atherosclerosis evident by CT imaging, previous Myoview in 2019 was negative for ischemia.  Both of her parents had heart disease.  She has not undergone interval ischemic evaluation.  Symptoms have been worse since December 2023.  Plan to proceed with coronary CTA for further investigation.  Resting heart rate is in the 60s.  2.  Palpitations as described above, no associated syncope or relation to  her thoracic discomfort.  Plan 72-hour Zio patch to rule out paroxysmal arrhythmia.  3.  Hypothyroidism, on Synthroid with follow-up by PCP.  4.  Mild ascending aortic dilatation at 4 cm by chest CTA in December 2023.  This can be reimaged in 1 year.  5.  History of gastric fundoplication/bypass.  Has moderate sized hiatal hernia which could also be contributing to symptoms.  Medication Adjustments/Labs and Tests Ordered: Current medicines are reviewed at length with the patient today.  Concerns regarding medicines are outlined above.   Tests Ordered: Orders Placed This Encounter  Procedures   CT CORONARY MORPH W/CTA COR W/SCORE W/CA W/CM &/OR WO/CM   LONG TERM MONITOR (3-14 DAYS)    Medication Changes: Meds ordered this encounter  Medications   metoprolol tartrate (LOPRESSOR) 50 MG tablet    Sig: Take 1 tablet by mouth 2 hours prior to CT Scan    Dispense:  1 tablet    Refill:  0    Disposition:  Follow up  test results.  Signed, Satira Sark, MD, [facility] 11/15/2022 9:11 AM    [facility]  [address], [place], [place] [address] Phone: [phone]; Fax: [phone]

## 2022-11-15 ENCOUNTER — Ambulatory Visit: Payer: Medicare Other | Attending: Cardiology | Admitting: Cardiology

## 2022-11-15 ENCOUNTER — Ambulatory Visit: Payer: Medicare Other | Attending: Cardiology

## 2022-11-15 ENCOUNTER — Encounter: Payer: Self-pay | Admitting: Cardiology

## 2022-11-15 VITALS — BP 126/84 | HR 62 | Ht 65.0 in | Wt 146.0 lb

## 2022-11-15 DIAGNOSIS — I700 Atherosclerosis of aorta: Secondary | ICD-10-CM | POA: Diagnosis not present

## 2022-11-15 DIAGNOSIS — I209 Angina pectoris, unspecified: Secondary | ICD-10-CM

## 2022-11-15 DIAGNOSIS — Z87898 Personal history of other specified conditions: Secondary | ICD-10-CM | POA: Diagnosis not present

## 2022-11-15 DIAGNOSIS — R079 Chest pain, unspecified: Secondary | ICD-10-CM

## 2022-11-15 DIAGNOSIS — I77819 Aortic ectasia, unspecified site: Secondary | ICD-10-CM

## 2022-11-15 DIAGNOSIS — R002 Palpitations: Secondary | ICD-10-CM | POA: Diagnosis not present

## 2022-11-15 MED ORDER — METOPROLOL TARTRATE 50 MG PO TABS: ORAL_TABLET | ORAL | 0 refills | Status: DC

## 2022-11-15 NOTE — Patient Instructions (Addendum)
Medication Instructions:  Your physician recommends that you continue on your current medications as directed. Please refer to the Current Medication list given to you today.   Labwork: None  Testing/Procedures: Coronary CTA  Follow-Up: Follow up pending test results.   Any Other Special Instructions Will Be Listed Below (If Applicable).     If you need a refill on your cardiac medications before your next appointment, please call your pharmacy.   ZIO XT- Long Term Monitor Instructions   Your physician has requested you wear your ZIO patch monitor____3___days.   This is a single patch monitor.  [employer] supplies one patch monitor per enrollment.  Additional stickers are not available.   Please do not apply patch if you will be having a Nuclear Stress Test, Echocardiogram, Cardiac CT, MRI, or Chest Xray during the time frame you would be wearing the monitor. The patch cannot be worn during these tests.  You cannot remove and re-apply the ZIO XT patch monitor.   Your ZIO patch monitor will be sent USPS Priority mail from [facility] directly to your home address. The monitor may also be mailed to a PO BOX if home delivery is not available.   It may take 3-5 days to receive your monitor after you have been enrolled.   Once you have received you monitor, please review enclosed instructions.  Your monitor has already been registered assigning a specific monitor serial # to you.   Applying the monitor   Shave hair from upper left chest.   Hold abrader disc by orange tab.  Rub abrader in 40 strokes over left upper chest as indicated in your monitor instructions.   Clean area with 4 enclosed alcohol pads .  Use all pads to assure are is cleaned thoroughly.  Let dry.   Apply patch as indicated in monitor instructions.  Patch will be place under collarbone on left side of chest with arrow pointing upward.   Rub patch adhesive wings for 2 minutes.Remove white label marked "1".   Remove white label marked "2".  Rub patch adhesive wings for 2 additional minutes.   While looking in a mirror, press and release button in center of patch.  A small green light will flash 3-4 times .  This will be your only indicator the monitor has been turned on.     Do not shower for the first 24 hours.  You may shower after the first 24 hours.   Press button if you feel a symptom. You will hear a small click.  Record Date, Time and Symptom in the Patient Log Book.   When you are ready to remove patch, follow instructions on last 2 pages of Patient Log Book.  Stick patch monitor onto last page of Patient Log Book.   Place Patient Log Book in [place] box.  Use locking tab on box and tape box closed securely.  The [employer] and [employer] has [employer] on it.  Please place in mailbox as soon as possible.  Your physician should have your test results approximately 7 days after the monitor has been mailed back to [facility].   Call [place] at [phone] if you have questions regarding your ZIO XT patch monitor.  Call them immediately if you see an orange light blinking on your monitor.   If your monitor falls off in less than 4 days contact our Monitor department at [phone].  If your monitor becomes loose or falls off after 4 days call  [employer] at [phone] for suggestions on securing your monitor.      Your cardiac CT will be scheduled at one of the below locations:   [facility] [address] [place], [place] [address] (336) [place] [address] [place], [place] [address] [phone]  [facility] [place], [place] [address] [phone]  If scheduled at [facility], please arrive at the [facility]'s Entrance (Entrance C2) of [facility] 30 minutes prior to test start time. You can use the  FREE valet parking offered at entrance C (encouraged to control the heart rate for the test)  Proceed to the [facility] Radiology Department (first floor) to check-in and test prep.  All radiology patients and guests should use entrance C2 at [facility], accessed from [facility], even though the hospital's physical address listed is [address]    If scheduled at [facility] or [facility], please arrive 15 mins early for check-in and test prep.   Please follow these instructions carefully (unless otherwise directed):  TAKE LOPRESSOR TABLET 2 HOURS PRIOR TO CT SCAN.   On the Night Before the Test: Be sure to Drink plenty of water. Do not consume any caffeinated/decaffeinated beverages or chocolate 12 hours prior to your test. Do not take any antihistamines 12 hours prior to your test.  On the Day of the Test: Drink plenty of water until 1 hour prior to the test. Do not eat any food 1 hour prior to test. You may take your regular medications prior to the test.  Take metoprolol (Lopressor) two hours prior to test. HOLD Furosemide/Hydrochlorothiazide morning of the test. FEMALES- please wear underwire-free bra if available, avoid dresses & tight clothing         After the Test: Drink plenty of water. After receiving IV contrast, you may experience a mild flushed feeling. This is normal. On occasion, you may experience a mild rash up to 24 hours after the test. This is not dangerous. If this occurs, you can take Benadryl 25 mg and increase your fluid intake. If you experience trouble breathing, this can be serious. If it is severe call 911 IMMEDIATELY. If it is mild, please call our office. If you take any of these medications: Glipizide/Metformin, Avandament, Glucavance, please do not take 48 hours after completing test unless otherwise instructed.  We will call to schedule your test 2-4 weeks out  understanding that some insurance companies will need an authorization prior to the service being performed.   For non-scheduling related questions, please contact the cardiac imaging nurse navigator should you have any questions/concerns: Marchia Bond, Cardiac Imaging Nurse Navigator Gordy Clement, Cardiac Imaging Nurse Navigator [facility] Direct Office Dial: [phone]   For scheduling needs, including cancellations and rescheduling, please call [place], [phone].

## 2022-11-15 NOTE — Addendum Note (Signed)
Addended by: Berlinda Last on: 11/15/2022 09:19 AM   Modules accepted: Orders

## 2022-11-22 ENCOUNTER — Telehealth (HOSPITAL_COMMUNITY): Payer: Self-pay | Admitting: *Deleted

## 2022-11-22 NOTE — Telephone Encounter (Signed)
Attempted to call patient regarding upcoming cardiac CT appointment. °Left message on voicemail with name and callback number ° °Danille Oppedisano RN Navigator Cardiac Imaging °[facility] °[phone] Office °[phone] Cell ° °

## 2022-11-23 ENCOUNTER — Ambulatory Visit (HOSPITAL_COMMUNITY)
Admission: RE | Admit: 2022-11-23 | Discharge: 2022-11-23 | Disposition: A | Discharge status: Home / Self Care | Payer: Medicare Other | Source: Ambulatory Visit | Attending: Cardiology | Admitting: Cardiology

## 2022-11-23 DIAGNOSIS — I209 Angina pectoris, unspecified: Secondary | ICD-10-CM

## 2022-11-23 DIAGNOSIS — Z87898 Personal history of other specified conditions: Secondary | ICD-10-CM | POA: Diagnosis present

## 2022-11-23 DIAGNOSIS — R079 Chest pain, unspecified: Secondary | ICD-10-CM | POA: Diagnosis present

## 2022-11-23 MED ORDER — NITROGLYCERIN 0.4 MG SL SUBL
SUBLINGUAL_TABLET | SUBLINGUAL | Status: AC
  Administered 2022-11-23: 0.8 mg via SUBLINGUAL
  Filled 2022-11-23: qty 2

## 2022-11-23 MED ORDER — IOHEXOL 350 MG/ML SOLN
100.0000 mL | Freq: Once | INTRAVENOUS | Status: AC | PRN
  Administered 2022-11-23: 100 mL via INTRAVENOUS

## 2022-11-23 MED ORDER — NITROGLYCERIN 0.4 MG SL SUBL: 0.8000 mg | SUBLINGUAL_TABLET | Freq: Once | SUBLINGUAL | Status: AC

## 2022-11-29 ENCOUNTER — Telehealth: Payer: Self-pay | Admitting: Cardiology

## 2022-11-29 NOTE — Telephone Encounter (Signed)
Patient returning call for CT results. She says to call her at work: [phone]

## 2022-11-29 NOTE — Telephone Encounter (Signed)
Patient notified and verbalized understanding. Patient had no questions or concerns at this time. PCP copied 

## 2022-11-29 NOTE — Telephone Encounter (Signed)
-----   Message from Kayla Sark, MD sent at 11/24/2022  1:19 PM EST ----- Results reviewed.  Coronary CTA showed a calcium score of 122, but importantly only mild nonobstructive coronary atherosclerosis.  Ascending aorta borderline dilated at 39 mm and also incidentally noted hiatal hernia.  These results would argue for risk factor modification, no indication to pursue coronary revascularization.  Would suggest statin therapy in the presence of atherosclerosis, consider Crestor 5 mg daily.  This can be started by her PCP with whom she should follow-up at this time.  I do wonder whether her hiatal hernia is leading to any of her symptoms.

## 2022-11-29 NOTE — Telephone Encounter (Signed)
-----   Message from Satira Sark, MD sent at 11/29/2022 11:33 AM EST ----- Results reviewed.  Cardiac monitor was reassuring overall.  She did have rare atrial and ventricular ectopy as well as very brief bursts of SVT.  She could feel palpitations with this, but importantly she did not have any sustained arrhythmia such as atrial fibrillation that would require further treatment.

## 2023-02-02 ENCOUNTER — Emergency Department (HOSPITAL_COMMUNITY): Payer: Medicare Other

## 2023-02-02 ENCOUNTER — Inpatient Hospital Stay (HOSPITAL_COMMUNITY): Payer: Medicare Other

## 2023-02-02 ENCOUNTER — Emergency Department (HOSPITAL_COMMUNITY): Payer: Medicare Other | Admitting: Anesthesiology

## 2023-02-02 ENCOUNTER — Other Ambulatory Visit: Payer: Self-pay

## 2023-02-02 ENCOUNTER — Encounter (HOSPITAL_COMMUNITY)
Admission: EM | Disposition: A | Discharge status: Home / Self Care | Payer: Self-pay | Source: Home / Self Care | Attending: General Surgery

## 2023-02-02 ENCOUNTER — Inpatient Hospital Stay (HOSPITAL_COMMUNITY)
Admission: EM | Admit: 2023-02-02 | Discharge: 2023-02-08 | DRG: 354 | Disposition: A | Discharge status: Home / Self Care | Payer: Medicare Other | Attending: General Surgery | Admitting: General Surgery

## 2023-02-02 DIAGNOSIS — J9811 Atelectasis: Secondary | ICD-10-CM | POA: Diagnosis present

## 2023-02-02 DIAGNOSIS — Z8249 Family history of ischemic heart disease and other diseases of the circulatory system: Secondary | ICD-10-CM

## 2023-02-02 DIAGNOSIS — K7200 Acute and subacute hepatic failure without coma: Secondary | ICD-10-CM | POA: Diagnosis present

## 2023-02-02 DIAGNOSIS — K449 Diaphragmatic hernia without obstruction or gangrene: Secondary | ICD-10-CM | POA: Diagnosis present

## 2023-02-02 DIAGNOSIS — K567 Ileus, unspecified: Secondary | ICD-10-CM | POA: Diagnosis not present

## 2023-02-02 DIAGNOSIS — M1990 Unspecified osteoarthritis, unspecified site: Secondary | ICD-10-CM | POA: Diagnosis present

## 2023-02-02 DIAGNOSIS — Z90710 Acquired absence of both cervix and uterus: Secondary | ICD-10-CM | POA: Diagnosis not present

## 2023-02-02 DIAGNOSIS — Z79890 Hormone replacement therapy (postmenopausal): Secondary | ICD-10-CM | POA: Diagnosis not present

## 2023-02-02 DIAGNOSIS — K439 Ventral hernia without obstruction or gangrene: Secondary | ICD-10-CM | POA: Diagnosis not present

## 2023-02-02 DIAGNOSIS — D72829 Elevated white blood cell count, unspecified: Secondary | ICD-10-CM | POA: Diagnosis not present

## 2023-02-02 DIAGNOSIS — Z79899 Other long term (current) drug therapy: Secondary | ICD-10-CM | POA: Diagnosis not present

## 2023-02-02 DIAGNOSIS — K436 Other and unspecified ventral hernia with obstruction, without gangrene: Secondary | ICD-10-CM | POA: Diagnosis present

## 2023-02-02 DIAGNOSIS — E039 Hypothyroidism, unspecified: Secondary | ICD-10-CM | POA: Diagnosis present

## 2023-02-02 DIAGNOSIS — Z7982 Long term (current) use of aspirin: Secondary | ICD-10-CM

## 2023-02-02 DIAGNOSIS — K559 Vascular disorder of intestine, unspecified: Secondary | ICD-10-CM | POA: Diagnosis present

## 2023-02-02 DIAGNOSIS — K55029 Acute infarction of small intestine, extent unspecified: Secondary | ICD-10-CM | POA: Diagnosis not present

## 2023-02-02 DIAGNOSIS — Z9884 Bariatric surgery status: Secondary | ICD-10-CM | POA: Diagnosis not present

## 2023-02-02 DIAGNOSIS — K56609 Unspecified intestinal obstruction, unspecified as to partial versus complete obstruction: Principal | ICD-10-CM

## 2023-02-02 DIAGNOSIS — K921 Melena: Secondary | ICD-10-CM | POA: Diagnosis present

## 2023-02-02 HISTORY — PX: LAPAROTOMY: SHX154

## 2023-02-02 LAB — CBC WITH DIFFERENTIAL/PLATELET
Abs Immature Granulocytes: 0.06 10*3/uL (ref 0.00–0.07)
Basophils Absolute: 0.1 10*3/uL (ref 0.0–0.1)
Basophils Relative: 1 %
Eosinophils Absolute: 0 10*3/uL (ref 0.0–0.5)
Eosinophils Relative: 0 %
HCT: 41.8 % (ref 36.0–46.0)
Hemoglobin: 13.8 g/dL (ref 12.0–15.0)
Immature Granulocytes: 1 %
Lymphocytes Relative: 11 %
Lymphs Abs: 1.3 10*3/uL (ref 0.7–4.0)
MCH: 30.8 pg (ref 26.0–34.0)
MCHC: 33 g/dL (ref 30.0–36.0)
MCV: 93.3 fL (ref 80.0–100.0)
Monocytes Absolute: 0.6 10*3/uL (ref 0.1–1.0)
Monocytes Relative: 5 %
Neutro Abs: 10.6 10*3/uL — ABNORMAL HIGH (ref 1.7–7.7)
Neutrophils Relative %: 82 %
Platelets: 181 10*3/uL (ref 150–400)
RBC: 4.48 MIL/uL (ref 3.87–5.11)
RDW: 12.8 % (ref 11.5–15.5)
WBC: 12.6 10*3/uL — ABNORMAL HIGH (ref 4.0–10.5)
nRBC: 0 % (ref 0.0–0.2)

## 2023-02-02 LAB — LIPASE, BLOOD: Lipase: 24 U/L (ref 11–51)

## 2023-02-02 LAB — TYPE AND SCREEN
ABO/RH(D): O NEG
Antibody Screen: NEGATIVE

## 2023-02-02 LAB — COMPREHENSIVE METABOLIC PANEL
ALT: 23 U/L (ref 0–44)
AST: 23 U/L (ref 15–41)
Albumin: 3.9 g/dL (ref 3.5–5.0)
Alkaline Phosphatase: 63 U/L (ref 38–126)
Anion gap: 11 (ref 5–15)
BUN: 14 mg/dL (ref 8–23)
CO2: 21 mmol/L — ABNORMAL LOW (ref 22–32)
Calcium: 8.8 mg/dL — ABNORMAL LOW (ref 8.9–10.3)
Chloride: 107 mmol/L (ref 98–111)
Creatinine, Ser: 0.62 mg/dL (ref 0.44–1.00)
GFR, Estimated: >60 mL/min (ref >60)
Glucose, Bld: 159 mg/dL — ABNORMAL HIGH (ref 70–99)
Potassium: 3.2 mmol/L — ABNORMAL LOW (ref 3.5–5.1)
Sodium: 139 mmol/L (ref 135–145)
Total Bilirubin: 0.6 mg/dL (ref 0.3–1.2)
Total Protein: 6.7 g/dL (ref 6.5–8.1)

## 2023-02-02 LAB — LACTIC ACID, PLASMA
Lactic Acid, Venous: 2.7 mmol/L (ref 0.5–1.9)
Lactic Acid, Venous: 1.8 mmol/L (ref 0.5–1.9)

## 2023-02-02 LAB — GLUCOSE, CAPILLARY: Glucose-Capillary: 157 mg/dL — ABNORMAL HIGH (ref 70–99)

## 2023-02-02 LAB — TROPONIN I (HIGH SENSITIVITY)
Troponin I (High Sensitivity): 2 ng/L (ref <18)
Troponin I (High Sensitivity): 2 ng/L (ref <18)

## 2023-02-02 SURGERY — LAPAROTOMY, EXPLORATORY
Anesthesia: General | Site: Abdomen

## 2023-02-02 MED ORDER — ONDANSETRON HCL 4 MG/2ML IJ SOLN: 4.0000 mg | Freq: Once | INTRAMUSCULAR | Status: DC | PRN

## 2023-02-02 MED ORDER — ONDANSETRON 4 MG PO TBDP: 4.0000 mg | ORAL_TABLET | Freq: Four times a day (QID) | ORAL | Status: DC | PRN

## 2023-02-02 MED ORDER — SUCCINYLCHOLINE CHLORIDE 200 MG/10ML IV SOSY
PREFILLED_SYRINGE | INTRAVENOUS | Status: DC | PRN
  Administered 2023-02-02: 100 mg via INTRAVENOUS

## 2023-02-02 MED ORDER — METOPROLOL TARTRATE 5 MG/5ML IV SOLN: 5.0000 mg | Freq: Four times a day (QID) | INTRAVENOUS | Status: DC | PRN

## 2023-02-02 MED ORDER — BUPIVACAINE HCL (PF) 0.5 % IJ SOLN
INTRAMUSCULAR | Status: AC
  Filled 2023-02-02: qty 30

## 2023-02-02 MED ORDER — SODIUM CHLORIDE 0.9 % IV SOLN
INTRAVENOUS | Status: AC
  Filled 2023-02-02: qty 20

## 2023-02-02 MED ORDER — ONDANSETRON HCL 4 MG/2ML IJ SOLN
INTRAMUSCULAR | Status: DC | PRN
  Administered 2023-02-02: 4 mg via INTRAVENOUS

## 2023-02-02 MED ORDER — SODIUM CHLORIDE 0.9 % IV BOLUS
500.0000 mL | Freq: Once | INTRAVENOUS | Status: AC
  Administered 2023-02-02: 500 mL via INTRAVENOUS

## 2023-02-02 MED ORDER — SODIUM CHLORIDE (PF) 0.9 % IJ SOLN
INTRAMUSCULAR | Status: AC
  Filled 2023-02-02: qty 10

## 2023-02-02 MED ORDER — SODIUM CHLORIDE 0.9 % IV SOLN: INTRAVENOUS | Status: DC | PRN

## 2023-02-02 MED ORDER — PROPOFOL 10 MG/ML IV BOLUS
INTRAVENOUS | Status: AC
  Filled 2023-02-02: qty 20

## 2023-02-02 MED ORDER — LACTATED RINGERS IV SOLN: INTRAVENOUS | Status: DC

## 2023-02-02 MED ORDER — POTASSIUM CHLORIDE 10 MEQ/100ML IV SOLN
10.0000 meq | INTRAVENOUS | Status: DC
  Administered 2023-02-02: 10 meq via INTRAVENOUS
  Filled 2023-02-02: qty 100

## 2023-02-02 MED ORDER — FENTANYL CITRATE (PF) 250 MCG/5ML IJ SOLN
INTRAMUSCULAR | Status: DC | PRN
  Administered 2023-02-02 (×4): 50 ug via INTRAVENOUS

## 2023-02-02 MED ORDER — MIDAZOLAM HCL 5 MG/5ML IJ SOLN
INTRAMUSCULAR | Status: DC | PRN
  Administered 2023-02-02: 1 mg via INTRAVENOUS

## 2023-02-02 MED ORDER — DIPHENHYDRAMINE HCL 50 MG/ML IJ SOLN: 12.5000 mg | Freq: Four times a day (QID) | INTRAMUSCULAR | Status: DC | PRN

## 2023-02-02 MED ORDER — SODIUM CHLORIDE 0.9 % IV SOLN
2.0000 g | INTRAVENOUS | Status: AC
  Administered 2023-02-02: 2 g via INTRAVENOUS

## 2023-02-02 MED ORDER — SUGAMMADEX SODIUM 200 MG/2ML IV SOLN
INTRAVENOUS | Status: DC | PRN
  Administered 2023-02-02: 134.2 mg via INTRAVENOUS

## 2023-02-02 MED ORDER — MORPHINE SULFATE (PF) 2 MG/ML IV SOLN
2.0000 mg | INTRAVENOUS | Status: DC | PRN
  Administered 2023-02-03: 2 mg via INTRAVENOUS
  Administered 2023-02-03: 4 mg via INTRAVENOUS
  Administered 2023-02-03 (×4): 2 mg via INTRAVENOUS
  Administered 2023-02-04: 4 mg via INTRAVENOUS
  Administered 2023-02-04: 2 mg via INTRAVENOUS
  Filled 2023-02-02: qty 1
  Filled 2023-02-02 (×2): qty 2
  Filled 2023-02-02 (×5): qty 1

## 2023-02-02 MED ORDER — ESMOLOL HCL 100 MG/10ML IV SOLN
INTRAVENOUS | Status: DC | PRN
  Administered 2023-02-02: 30 mg via INTRAVENOUS

## 2023-02-02 MED ORDER — HYDROMORPHONE HCL 1 MG/ML IJ SOLN
1.0000 mg | Freq: Once | INTRAMUSCULAR | Status: AC
  Administered 2023-02-02: 1 mg via INTRAVENOUS
  Filled 2023-02-02: qty 1

## 2023-02-02 MED ORDER — MIDAZOLAM HCL 2 MG/2ML IJ SOLN
INTRAMUSCULAR | Status: AC
  Filled 2023-02-02: qty 2

## 2023-02-02 MED ORDER — PHENYLEPHRINE 80 MCG/ML (10ML) SYRINGE FOR IV PUSH (FOR BLOOD PRESSURE SUPPORT)
PREFILLED_SYRINGE | INTRAVENOUS | Status: AC
  Filled 2023-02-02: qty 10

## 2023-02-02 MED ORDER — ESMOLOL HCL 100 MG/10ML IV SOLN
INTRAVENOUS | Status: AC
  Filled 2023-02-02: qty 10

## 2023-02-02 MED ORDER — PROPOFOL 10 MG/ML IV BOLUS
INTRAVENOUS | Status: DC | PRN
  Administered 2023-02-02: 120 mg via INTRAVENOUS

## 2023-02-02 MED ORDER — PANTOPRAZOLE SODIUM 40 MG IV SOLR
40.0000 mg | Freq: Every day | INTRAVENOUS | Status: DC
  Administered 2023-02-02 – 2023-02-07 (×6): 40 mg via INTRAVENOUS
  Filled 2023-02-02 (×6): qty 10

## 2023-02-02 MED ORDER — MORPHINE SULFATE (PF) 4 MG/ML IV SOLN
4.0000 mg | Freq: Once | INTRAVENOUS | Status: AC
  Administered 2023-02-02: 4 mg via INTRAVENOUS
  Filled 2023-02-02: qty 1

## 2023-02-02 MED ORDER — PHENYLEPHRINE HCL-NACL 20-0.9 MG/250ML-% IV SOLN
INTRAVENOUS | Status: DC | PRN
  Administered 2023-02-02: 20 ug/min via INTRAVENOUS

## 2023-02-02 MED ORDER — IOHEXOL 300 MG/ML  SOLN
100.0000 mL | Freq: Once | INTRAMUSCULAR | Status: AC | PRN
  Administered 2023-02-02: 100 mL via INTRAVENOUS

## 2023-02-02 MED ORDER — DEXAMETHASONE SODIUM PHOSPHATE 10 MG/ML IJ SOLN
INTRAMUSCULAR | Status: AC
  Filled 2023-02-02: qty 1

## 2023-02-02 MED ORDER — LACTATED RINGERS IV SOLN: INTRAVENOUS | Status: DC | PRN

## 2023-02-02 MED ORDER — FENTANYL CITRATE (PF) 250 MCG/5ML IJ SOLN
INTRAMUSCULAR | Status: AC
  Filled 2023-02-02: qty 5

## 2023-02-02 MED ORDER — ONDANSETRON HCL 4 MG/2ML IJ SOLN
4.0000 mg | Freq: Once | INTRAMUSCULAR | Status: AC
  Administered 2023-02-02: 4 mg via INTRAVENOUS
  Filled 2023-02-02: qty 2

## 2023-02-02 MED ORDER — PHENYLEPHRINE 80 MCG/ML (10ML) SYRINGE FOR IV PUSH (FOR BLOOD PRESSURE SUPPORT)
PREFILLED_SYRINGE | INTRAVENOUS | Status: DC | PRN
  Administered 2023-02-02 (×2): 160 ug via INTRAVENOUS

## 2023-02-02 MED ORDER — FENTANYL CITRATE PF 50 MCG/ML IJ SOSY
25.0000 ug | PREFILLED_SYRINGE | Freq: Once | INTRAMUSCULAR | Status: AC
  Administered 2023-02-02: 25 ug via INTRAVENOUS
  Filled 2023-02-02: qty 1

## 2023-02-02 MED ORDER — DEXAMETHASONE SODIUM PHOSPHATE 10 MG/ML IJ SOLN
INTRAMUSCULAR | Status: DC | PRN
  Administered 2023-02-02: 10 mg via INTRAVENOUS

## 2023-02-02 MED ORDER — HEPARIN SODIUM (PORCINE) 5000 UNIT/ML IJ SOLN
5000.0000 [IU] | Freq: Three times a day (TID) | INTRAMUSCULAR | Status: DC
  Administered 2023-02-03 – 2023-02-08 (×16): 5000 [IU] via SUBCUTANEOUS
  Filled 2023-02-02 (×16): qty 1

## 2023-02-02 MED ORDER — LEVOTHYROXINE SODIUM 137 MCG PO TABS
137.0000 ug | ORAL_TABLET | Freq: Every day | ORAL | Status: DC
  Administered 2023-02-03: 137 ug via ORAL
  Filled 2023-02-02 (×4): qty 1

## 2023-02-02 MED ORDER — SUCCINYLCHOLINE CHLORIDE 200 MG/10ML IV SOSY
PREFILLED_SYRINGE | INTRAVENOUS | Status: AC
  Filled 2023-02-02: qty 10

## 2023-02-02 MED ORDER — CHLORHEXIDINE GLUCONATE CLOTH 2 % EX PADS
6.0000 | MEDICATED_PAD | Freq: Every day | CUTANEOUS | Status: DC
  Administered 2023-02-02 – 2023-02-07 (×6): 6 via TOPICAL

## 2023-02-02 MED ORDER — ROCURONIUM BROMIDE 10 MG/ML (PF) SYRINGE
PREFILLED_SYRINGE | INTRAVENOUS | Status: DC | PRN
  Administered 2023-02-02: 50 mg via INTRAVENOUS

## 2023-02-02 MED ORDER — LACTATED RINGERS IV BOLUS
500.0000 mL | Freq: Once | INTRAVENOUS | Status: AC
  Administered 2023-02-02: 500 mL via INTRAVENOUS

## 2023-02-02 MED ORDER — SODIUM CHLORIDE 0.9 % IR SOLN
Status: DC | PRN
  Administered 2023-02-02 (×4): 1000 mL

## 2023-02-02 MED ORDER — BUPIVACAINE HCL (PF) 0.5 % IJ SOLN
INTRAMUSCULAR | Status: DC | PRN
  Administered 2023-02-02: 30 mL

## 2023-02-02 MED ORDER — SODIUM CHLORIDE 0.9 % IV SOLN
2.0000 g | Freq: Two times a day (BID) | INTRAVENOUS | Status: AC
  Administered 2023-02-03 – 2023-02-05 (×6): 2 g via INTRAVENOUS
  Filled 2023-02-02 (×7): qty 2

## 2023-02-02 MED ORDER — DIPHENHYDRAMINE HCL 12.5 MG/5ML PO ELIX: 12.5000 mg | ORAL_SOLUTION | Freq: Four times a day (QID) | ORAL | Status: DC | PRN

## 2023-02-02 MED ORDER — HYDROMORPHONE HCL 1 MG/ML IJ SOLN: 0.2500 mg | INTRAMUSCULAR | Status: DC | PRN

## 2023-02-02 MED ORDER — ONDANSETRON HCL 4 MG/2ML IJ SOLN: 4.0000 mg | Freq: Four times a day (QID) | INTRAMUSCULAR | Status: DC | PRN

## 2023-02-02 MED ORDER — ROCURONIUM BROMIDE 10 MG/ML (PF) SYRINGE
PREFILLED_SYRINGE | INTRAVENOUS | Status: AC
  Filled 2023-02-02: qty 10

## 2023-02-02 MED ORDER — CHLORHEXIDINE GLUCONATE CLOTH 2 % EX PADS: 6.0000 | MEDICATED_PAD | Freq: Once | CUTANEOUS | Status: DC

## 2023-02-02 MED ORDER — LIDOCAINE HCL (PF) 2 % IJ SOLN
INTRAMUSCULAR | Status: AC
  Filled 2023-02-02: qty 5

## 2023-02-02 MED ORDER — PANTOPRAZOLE SODIUM 40 MG IV SOLR
40.0000 mg | Freq: Once | INTRAVENOUS | Status: AC
  Administered 2023-02-02: 40 mg via INTRAVENOUS
  Filled 2023-02-02: qty 10

## 2023-02-02 SURGICAL SUPPLY — 68 items
APL PRP STRL LF DISP 70% ISPRP (MISCELLANEOUS) ×1
APPLIER CLIP 11 MED OPEN (CLIP)
APPLIER CLIP 13 LRG OPEN (CLIP)
APR CLP LRG 13 20 CLIP (CLIP)
APR CLP MED 11 20 MLT OPN (CLIP)
BARRIER SKIN 2 3/4 (OSTOMY) IMPLANT
BARRIER SKIN OD2.25 2 3/4 FLNG (OSTOMY) IMPLANT
BRR SKN FLT 2.75X2.25 2 PC (OSTOMY)
CHLORAPREP W/TINT 26 (MISCELLANEOUS) ×1 IMPLANT
CLAMP POUCH DRAINAGE QUIET (OSTOMY) IMPLANT
CLIP APPLIE 11 MED OPEN (CLIP) IMPLANT
CLIP APPLIE 13 LRG OPEN (CLIP) IMPLANT
CLOTH BEACON ORANGE TIMEOUT ST (SAFETY) ×1 IMPLANT
COVER LIGHT HANDLE STERIS (MISCELLANEOUS) ×2 IMPLANT
DRAPE WARM FLUID 44X44 (DRAPES) ×1 IMPLANT
DRSG OPSITE POSTOP 4X10 (GAUZE/BANDAGES/DRESSINGS) IMPLANT
DRSG OPSITE POSTOP 4X8 (GAUZE/BANDAGES/DRESSINGS) IMPLANT
ELECT BLADE 6 FLAT ULTRCLN (ELECTRODE) IMPLANT
ELECT REM PT RETURN 9FT ADLT (ELECTROSURGICAL) ×1
ELECTRODE REM PT RTRN 9FT ADLT (ELECTROSURGICAL) ×1 IMPLANT
GAUZE 4X4 16PLY ~~LOC~~+RFID DBL (SPONGE) IMPLANT
GLOVE BIO SURGEON STRL SZ 6.5 (GLOVE) ×1 IMPLANT
GLOVE BIOGEL PI IND STRL 6.5 (GLOVE) ×1 IMPLANT
GLOVE BIOGEL PI IND STRL 7.0 (GLOVE) ×2 IMPLANT
GOWN STRL REUS W/TWL LRG LVL3 (GOWN DISPOSABLE) ×3 IMPLANT
HANDLE SUCTION POOLE (INSTRUMENTS) ×1 IMPLANT
INST SET MAJOR GENERAL (KITS) ×1 IMPLANT
KIT REMOVER STAPLE SKIN (MISCELLANEOUS) IMPLANT
KIT TURNOVER KIT A (KITS) ×1 IMPLANT
LIGASURE IMPACT 36 18CM CVD LR (INSTRUMENTS) IMPLANT
MANIFOLD NEPTUNE II (INSTRUMENTS) ×1 IMPLANT
NDL HYPO 18GX1.5 BLUNT FILL (NEEDLE) ×1 IMPLANT
NDL HYPO 21X1.5 SAFETY (NEEDLE) ×1 IMPLANT
NEEDLE HYPO 18GX1.5 BLUNT FILL (NEEDLE) ×1 IMPLANT
NEEDLE HYPO 21X1.5 SAFETY (NEEDLE) ×1 IMPLANT
NS IRRIG 1000ML POUR BTL (IV SOLUTION) ×2 IMPLANT
PACK MAJOR ABDOMINAL (CUSTOM PROCEDURE TRAY) ×1 IMPLANT
PAD ARMBOARD 7.5X6 YLW CONV (MISCELLANEOUS) ×1 IMPLANT
PENCIL SMOKE EVACUATOR COATED (MISCELLANEOUS) ×1 IMPLANT
POUCH OSTOMY 2 3/4  H 3804 (WOUND CARE)
POUCH OSTOMY 2 3/4 H 3804 (WOUND CARE)
POUCH OSTOMY 2 PC DRNBL 2.75 (WOUND CARE) IMPLANT
RELOAD LINEAR CUT PROX 55 BLUE (ENDOMECHANICALS) IMPLANT
RELOAD PROXIMATE 75MM BLUE (ENDOMECHANICALS) IMPLANT
RELOAD STAPLE 55 3.8 BLU REG (ENDOMECHANICALS) IMPLANT
RELOAD STAPLE 75 3.8 BLU REG (ENDOMECHANICALS) IMPLANT
RETRACTOR WND ALEXIS-O 25 LRG (MISCELLANEOUS) IMPLANT
RETRACTOR WOUND ALXS 18CM MED (MISCELLANEOUS) IMPLANT
RTRCTR WOUND ALEXIS O 18CM MED (MISCELLANEOUS)
RTRCTR WOUND ALEXIS O 25CM LRG (MISCELLANEOUS) ×1
SET BASIN LINEN APH (SET/KITS/TRAYS/PACK) ×1 IMPLANT
SPONGE T-LAP 18X18 ~~LOC~~+RFID (SPONGE) ×1 IMPLANT
STAPLER GUN LINEAR PROX 60 (STAPLE) IMPLANT
STAPLER PROXIMATE 55 BLUE (STAPLE) IMPLANT
STAPLER PROXIMATE 75MM BLUE (STAPLE) IMPLANT
STAPLER VISISTAT (STAPLE) ×1 IMPLANT
SUCTION POOLE HANDLE (INSTRUMENTS) ×1
SUT CHROMIC 0 SH (SUTURE) IMPLANT
SUT CHROMIC 2 0 SH (SUTURE) IMPLANT
SUT CHROMIC 3 0 SH 27 (SUTURE) IMPLANT
SUT PDS AB CT VIOLET #0 27IN (SUTURE) ×2 IMPLANT
SUT PROLENE 2 0 SH 30 (SUTURE) IMPLANT
SUT SILK 3 0 SH CR/8 (SUTURE) ×1 IMPLANT
SUT VIC AB 2-0 CT1 27 (SUTURE) ×1
SUT VIC AB 2-0 CT1 TAPERPNT 27 (SUTURE) IMPLANT
SWAB CULTURE ESWAB REG 1ML (MISCELLANEOUS) IMPLANT
SYR 30ML LL (SYRINGE) ×2 IMPLANT
TRAY FOLEY MTR SLVR 16FR STAT (SET/KITS/TRAYS/PACK) ×1 IMPLANT

## 2023-02-02 NOTE — ED Notes (Addendum)
Date and time results received: 02/02/23 0423 (use smartphrase ".now" to insert current time)  Test: Lactic Acid  Critical Value: 2.7  Name of Provider Notified: Dr Charm Barges  Orders Received? Or Actions Taken?: Gave report to PA [facility]

## 2023-02-02 NOTE — Transfer of Care (Signed)
Immediate Anesthesia Transfer of Care Note  Patient: Kayla Dean  Procedure(s) Performed: EXPLORATORY LAPAROTOMY, REDUCTION OF HERNIA, EXPLANT OF ABDOMINAL MESH (Abdomen)  Patient Location: PACU  Anesthesia Type:General  Level of Consciousness: awake, sedated, drowsy, and patient cooperative  Airway & Oxygen Therapy: Patient Spontanous Breathing and Patient connected to face mask oxygen  Post-op Assessment: Report given to RN and Post -op Vital signs reviewed and stable  Post vital signs: Reviewed and stable  Last Vitals:  Vitals Value Taken Time  BP 132/93 02/02/23 2145  Temp 36.6 C 02/02/23 2145  Pulse 93 02/02/23 2148  Resp 11 02/02/23 2148  SpO2 100 % 02/02/23 2148  Vitals shown include unvalidated device data.  Last Pain:  Vitals:   02/02/23 1930  TempSrc:   PainSc: Asleep         Complications: No notable events documented.

## 2023-02-02 NOTE — Progress Notes (Addendum)
[facility]  Updated family. Showed the photos and updated them on her anatomy for future/ drawing given.  PRN for pain IS CXR- attempted NG but anesthesia could not pass, minor bleeding   Stepdown NPO Sip with med Expect ileus Labs in AM LR @ 100 Cefotetan for 3 days post op SCDs, heparin sq Kayla Mule, MD [facility] [address] [place], [place] [address] [phone] (office)

## 2023-02-02 NOTE — Anesthesia Preprocedure Evaluation (Addendum)
Anesthesia Evaluation  Patient identified by MRN, date of birth, ID band Patient awake    Reviewed: Allergy & Precautions, H&P , NPO status , Patient's Chart, lab work & pertinent test results, reviewed documented beta blocker date and time   Airway Mallampati: III  TM Distance: >3 FB Neck ROM: Full    Dental no notable dental hx. (+) Upper Dentures, Lower Dentures   Pulmonary neg pulmonary ROS   Pulmonary exam normal breath sounds clear to auscultation       Cardiovascular Exercise Tolerance: Good (-) hypertensionPt. on home beta blockers Normal cardiovascular exam Rhythm:Regular Rate:Normal  02-Feb-2023 14:20:37 [name] ROUTINE RECORD 1951-08-20 (71 yr) Female Caucasian Vent. rate 54 BPM PR interval 169 ms QRS duration 98 ms QT/QTcB 474/450 ms P-R-T axes 59 -1 32 Sinus rhythm No significant change since prior 12/23 Confirmed by Meridee Score [phone]) on 02/02/2023 3:03:03 PM [name], MD 11/24/2022  1:19 PM EST   Results reviewed.  Coronary CTA showed a calcium score of 122, but importantly only mild nonobstructive coronary atherosclerosis.  Ascending aorta borderline dilated at 39 mm and also incidentally noted hiatal hernia.  These results would argue for risk factor modification, no indication to pursue coronary revascularization.  Would suggest statin therapy in the presence of atherosclerosis, consider Crestor 5 mg daily.  This can be started by her PCP with whom she should follow-up at this time.  I do wonder whether her hiatal hernia is leading to any of her symptoms.      Neuro/Psych  Neuromuscular disease  negative psych ROS   GI/Hepatic Neg liver ROS, hiatal hernia,GERD  Controlled,,  Endo/Other  Hypothyroidism    Renal/GU negative Renal ROS  negative genitourinary   Musculoskeletal  (+) Arthritis , Osteoarthritis,    Abdominal   Peds negative pediatric ROS (+)   Hematology negative hematology ROS (+)   Anesthesia Other Findings   Reproductive/Obstetrics negative OB ROS                             Anesthesia Physical Anesthesia Plan  ASA: 3 and emergent  Anesthesia Plan: General   Post-op Pain Management: Dilaudid IV   Induction: Intravenous, Rapid sequence and Cricoid pressure planned  PONV Risk Score and Plan: 4 or greater and Ondansetron and Dexamethasone  Airway Management Planned: Oral ETT  Additional Equipment:   Intra-op Plan:   Post-operative Plan: Extubation in OR and Possible Post-op intubation/ventilation  Informed Consent: I have reviewed the patients History and Physical, chart, labs and discussed the procedure including the risks, benefits and alternatives for the proposed anesthesia with the patient or authorized representative who has indicated his/her understanding and acceptance.     Dental advisory given  Plan Discussed with: CRNA and Surgeon  Anesthesia Plan Comments:        Anesthesia Quick Evaluation

## 2023-02-02 NOTE — Op Note (Addendum)
[facility] Operative Note  02/02/23  Preoperative Diagnosis:  Incarcerated ventral hernia, ischemic bowel, history of gastric bypass     Postoperative Diagnosis: Incarcerated ventral hernia, viable bowel, history of gastric bypass, small mesenteric defect     Procedure(s) Performed:  Exploratory laparotomy, reduction of incarcerated hernia, primary repair hernia (10cm defect), explant of mesh; closure of mesenteric defect   Surgeon: Leatrice Jewels. Henreitta Leber, MD   Assistants: No qualified resident was available    Anesthesia: General endotracheal   Anesthesiologist: Molli Barrows, MD    Specimens: Explanted mesh   Estimated Blood Loss: Minimal   Blood Replacement: None    Complications: None   Wound Class: Clean contaminated (ischemic bowel possible translocation)    Operative Indications: Kayla Dean is a 72 yo with a history of gastric bypass and hernia repairs with mesh and component separation. She came in with abdominal pain that was worsening and CT with concern for ischemic bowel.  I reviewed her scan and discussed the surgery with her and her daughter. We discussed the risk of bleeding, infection, need for bowel resection, hernia repair, possibility of revision of the gastric bypass and need to transfer her in discontinuity pending how she was doing intraoperatively. Discussed risk of central line and arterial line, and potential need for ICU stay after surgery.   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 to the JJ bilopancreatic anastomosis which looked almost like a JJJ anastomosis from prior revision; the bowel pinked up and was viable at the end of the case with only minor hemorrhagic changes at the edge of the  mesentery   Small mesenteric defect 6cm at the alimentary JJ anastomosis   Diagram of Anatomy (antecolic limb)    Diagram of anterior fascia and posterior mesh sandwiching the ischemic bowel (Sagittal view)    Ischemic bowel Photo of bowel when first reduced from the hernia defect -ischemia resolved after this (No photo of resolution)   Procedure: The patient was taken to the operating room and placed supine. General endotracheal anesthesia was induced. Intravenous antibiotics were  administered per protocol.  An orogastric tube positioned to decompress the stomach. A foley catheter was placed. The abdomen was prepped and draped in the usual sterile fashion.   Her midline incision was used to and carried down carefully into the subcutaneous tissue with a scalpel. I knew her bowel was close to the skin. I encountered what appeared to be anterior fascia closed with prolene interrupted sutures and this was intact. I carefully opened this with a new scalpel in layers. I got into the abdomen and noted ischemic appearing bowel. I was unable to get intraperitoneal as this was closed off by a posterior piece of mesh that was torn from the right sided superior abdominal wall and the bowel was coming  through this defect.  I was able to open this defect up and then proceeded to remove the mesh from the edges of the retracted posterior fascia with scissors. The mesh that was not incorporated was explanted and sent to pathology.    From here I was able to eviscerate the intestines.  A sample of ascites was obtained and sent for culture pending any translocated bacteria. The bowel was ischemic initially but became viable with time, reduction, and warm saline  bath in the abdominal cavity.   It took a while to sort out her anatomy.  She has an antecolic alimentary limb that goes to a pouch and a JJ anastomosis in this limb which is unusual. She then has a bilipancreatic limb that contents to the distal part of  the alimentary limb in an almost three way appearing anastomosis to the distal small bowel (see the diagram above). I have included the distances of the limbs for future reference. Her alimentary limb from the GJ to the JJ anastomosis was 50 cm, from the JJ limb to the normal biliopancreatic to alimentary JJ anastomosis was 130cm. The bilipancreatic limb from the ligament of treitz to the normal JJ anastomosis which is almost a three way in her was 40cm. Her distal small bowel was over 210 cm.   At the end of the case the bowel was viable and pink and peristalsis had started.  There was some minor hemorrhage changes on the edge of the mesentery but this was minimal.   She had a small mesenteric defect at the alimentary limb JJ anastomosis. I closed this with 3-0 Vicryl in a running fashion to prevent any future internal hernia.  Her bowel was placed back into her abdomen without any twisting. I attempted to feel for an NG tube but this could not be felt. I did feel an odd hardening to the left of her hiatus and she had reports of a prior Nissen, so I wonder if she has mesh in this area from a repair or a wrap still. The NG never came through the GE junction.   More warm saline irrigation was performed.   The anterior fascia was closed with interrupted 0 PDS suture. The defect was 10cm in size to close the hernia primarily. Bupivacine was injected. The skin was closed with staples and covered with a honeycomb.  Foley was left in place.   Final inspection revealed acceptable hemostasis. All counts were correct at the end of the case. The patient was awakened from anesthesia and extubated without complication.  The patient went to the PACU in stable condition.S he was on some neo-synephrine during this case but this weaned off with extubation.    Algis Greenhouse, MD [facility] [address] [place], [place] [address] [phone] (office)

## 2023-02-02 NOTE — Anesthesia Procedure Notes (Signed)
Procedure Name: Intubation Date/Time: 02/02/2023 7:56 PM  Performed by: Molli Barrows, MDPre-anesthesia Checklist: Patient identified, Emergency Drugs available, Suction available and Patient being monitored Patient Re-evaluated:Patient Re-evaluated prior to induction Oxygen Delivery Method: Circle system utilized Preoxygenation: Pre-oxygenation with 100% oxygen Induction Type: IV induction, Cricoid Pressure applied and Rapid sequence Laryngoscope Size: Mac and 3 Grade View: Grade I Tube type: Oral Tube size: 7.0 mm Number of attempts: 1 Airway Equipment and Method: Stylet Placement Confirmation: ETT inserted through vocal cords under direct vision, positive ETCO2 and breath sounds checked- equal and bilateral Secured at: 21 cm Tube secured with: Tape Dental Injury: Teeth and Oropharynx as per pre-operative assessment

## 2023-02-02 NOTE — ED Notes (Signed)
Patient transported to CT 

## 2023-02-02 NOTE — ED Triage Notes (Signed)
Patient BIB EMS from home for c/o abdominal pain, back pain and an episode of vomiting. Patient states symptoms began this morning. She reports having a BM this morning. Hx of AA, and hernia. A&Ox 4

## 2023-02-02 NOTE — ED Notes (Signed)
Anesthesiologist and surgical RN at bedside

## 2023-02-02 NOTE — ED Provider Notes (Signed)
[place] EMERGENCY DEPARTMENT AT [facility] Provider Note   CSN: [id number] Arrival date & time: 02/02/23  1339     History  Chief Complaint  Patient presents with   Abdominal Pain    Kayla Dean is a 72 y.o. female.  The ED complaining of upper abdominal pain that started this morning.  She is having pain in her upper back.  Denies fever or chills.  States she had a similar episode a few months ago but that that was possibly cardiac related and she did follow-up with cardiology.  States she did not have vomiting with this in the past however and the pain is much more intense this time.  Denies fevers or chills.  Reports history of hernia repair in the past and history of gastric bypass   Abdominal Pain      Home Medications Prior to Admission medications   Medication Sig Start Date End Date Taking? Authorizing Provider  aspirin EC 81 MG EC tablet Take 1 tablet (81 mg total) by mouth daily. Patient taking differently: Take 81 mg by mouth. "Very seldom" 08/15/18   Vassie Loll, MD  Calcium Carb-Cholecalciferol (CALCIUM 600 + D) 600-200 MG-UNIT TABS Take 2 tablets by mouth every morning.    [provider]  ferrous sulfate 325 (65 FE) MG tablet Take 325 mg by mouth every morning.    [provider]  levothyroxine (SYNTHROID, LEVOTHROID) 137 MCG tablet Take 137 mcg by mouth daily before breakfast.    [provider]  metoprolol tartrate (LOPRESSOR) 50 MG tablet Take 1 tablet by mouth 2 hours prior to CT Scan 11/15/22   [name], MD  Multiple Vitamin (MULTIVITAMIN WITH MINERALS) TABS tablet Take 1 tablet by mouth every morning.    [provider]      Allergies    Patient has no known allergies.    Review of Systems   Review of Systems  Gastrointestinal:  Positive for abdominal pain.    Physical Exam Updated Vital Signs BP 115/80   Pulse 97   Temp 98.4 F (36.9 C)   Resp 12   Wt 67.1 kg   SpO2 100%   BMI  24.63 kg/m  Physical Exam Vitals and nursing note reviewed.  Constitutional:      Appearance: She is well-developed.     Comments: Patient in obvious pain  HENT:     Head: Normocephalic and atraumatic.  Eyes:     Conjunctiva/sclera: Conjunctivae normal.  Cardiovascular:     Rate and Rhythm: Normal rate and regular rhythm.     Heart sounds: No murmur heard. Pulmonary:     Effort: Pulmonary effort is normal. No respiratory distress.     Breath sounds: Normal breath sounds.  Abdominal:     Palpations: Abdomen is soft.     Tenderness: There is abdominal tenderness in the epigastric area and periumbilical area. There is no guarding or rebound.  Musculoskeletal:        General: No swelling.     Cervical back: Neck supple.  Skin:    General: Skin is warm and dry.     Capillary Refill: Capillary refill takes less than 2 seconds.  Neurological:     Mental Status: She is alert.  Psychiatric:        Mood and Affect: Mood normal.     ED Results / Procedures / Treatments   Labs (all labs ordered are listed, but only abnormal results are displayed) Labs Reviewed  COMPREHENSIVE METABOLIC PANEL - Abnormal; Notable for the following components:      Result Value   Potassium 3.2 (*)    CO2 21 (*)    Glucose, Bld 159 (*)    Calcium 8.8 (*)    All other components within normal limits  CBC WITH DIFFERENTIAL/PLATELET - Abnormal; Notable for the following components:   WBC 12.6 (*)    Neutro Abs 10.6 (*)    All other components within normal limits  LACTIC ACID, PLASMA - Abnormal; Notable for the following components:   Lactic Acid, Venous 2.7 (*)    All other components within normal limits  GLUCOSE, CAPILLARY - Abnormal; Notable for the following components:   Glucose-Capillary 157 (*)    All other components within normal limits  AEROBIC/ANAEROBIC CULTURE W GRAM STAIN (SURGICAL/DEEP WOUND)  MRSA NEXT GEN BY PCR, NASAL  LIPASE, BLOOD  LACTIC ACID, PLASMA  URINALYSIS, ROUTINE W  REFLEX MICROSCOPIC  COMPREHENSIVE METABOLIC PANEL  MAGNESIUM  PHOSPHORUS  CBC WITH DIFFERENTIAL/PLATELET  TYPE AND SCREEN  SURGICAL PATHOLOGY  TROPONIN I (HIGH SENSITIVITY)  TROPONIN I (HIGH SENSITIVITY)    EKG EKG Interpretation  Date/Time:  Friday February 02 2023 14:20:37 EDT Ventricular Rate:  54 PR Interval:  169 QRS Duration: 98 QT Interval:  474 QTC Calculation: 450 R Axis:   -1 Text Interpretation: Sinus rhythm No significant change since prior 12/23 Confirmed by Meridee Score [phone]) on 02/02/2023 3:03:03 PM  Radiology DG Chest Port 1 View  Result Date: 02/02/2023 CLINICAL DATA:  Internal hernia status post surgical repair EXAM: PORTABLE CHEST 1 VIEW COMPARISON:  02/02/2023 FINDINGS: The lungs are well expanded. Small left pleural effusion with associated mild left basilar atelectasis. Lungs are otherwise clear. No pneumothorax. No pleural effusion on the right. Cardiac size within normal limits. Pulmonary vascularity is normal. No acute bone abnormality. IMPRESSION: 1. Small left pleural effusion with associated mild left basilar atelectasis. Electronically Signed   By: Helyn Numbers M.D.   On: 02/02/2023 22:59   CT ABDOMEN PELVIS W CONTRAST  Result Date: 02/02/2023 CLINICAL DATA:  Re i re EXAM: CT ABDOMEN AND PELVIS WITH CONTRAST TECHNIQUE: Multidetector CT imaging of the abdomen and pelvis was performed using the standard protocol following bolus administration of intravenous contrast. RADIATION DOSE REDUCTION: This exam was performed according to the departmental dose-optimization program which includes automated exposure control, adjustment of the mA and/or kV according to patient size and/or use of iterative reconstruction technique. CONTRAST:  OMNIPAQUE IOHEXOL 300 MG/ML  SOLN COMPARISON:  None Available. FINDINGS: Lower chest: Bandlike opacity lung bases likely scar atelectasis. No pleural effusion. Hepatobiliary: Mild biliary ductal dilatation. There is some  tapering of the common duct towards the pancreatic head. The gallbladder is absent. Pancreas: Moderate atrophy of the pancreas. Spleen: Normal in size without focal abnormality. Adrenals/Urinary Tract: Nodular appearance of the adrenal glands. Focus on the right previously had Hounsfield unit of 2 consistent with an adenoma. Focus on the left previously Hounsfield units of 7. No additional follow-up. No enhancing renal mass or collecting system dilatation. 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 Small hiatal hernia. There is some fluid tracking along the lower mediastinum. Jejunostomy limb extends down and has focal area of dilatation with epigastric midline anterior abdominal wall hernia. This loop is fluid-filled and dilated up to 4.7 cm. In addition in the walls of the this loop of dilated bowel poorly enhances. No pneumatosis or free air at this time but changes are worrisome  for developing ischemia. There are distal loops of bowel which are nondilated consistent with a contributing obstruction and caliber change at the level of the epigastric hernia. Please see focal caliber change and sagittal image 67 of series 6. Again the colon is nondilated. The excluded stomach also has some subtle wall thickening but is nondilated. Vascular/Lymphatic: Normal caliber aorta and IVC. Mild vascular calcifications. No specific abnormal lymph node enlargement identified in the abdomen and pelvis. Reproductive: Status post hysterectomy. No adnexal masses. Other: No free intra-abdominal air. There is scattered ascites throughout the mesentery and into the pelvis. Fluid in the mesentery is in the area of abnormal bowel loops. Musculoskeletal: Curvature of the spine with degenerative changes. Streak artifact related to the patient's right hip arthroplasty obscuring portions of the right hemipelvis. IMPRESSION: Surgical changes of previous gastric bypass with the esophageal jejunostomy.  There are several dilated loops of small bowel in the upper abdomen with an epigastric hernia and focal caliber change. Possibly incarcerated hernia/focal bowel obstruction. In addition there is severe mesenteric fluid in this location and areas of the walls of the bowel that are poorly enhancing compared to other loops. Early changes of ischemia are possible. Please correlate with clinical findings. Previous cholecystectomy. Dilated biliary tree but tapering towards the pancreatic head. Please correlate with any prior but this is progressive from a chest CT scan of 09/15/2022 recommend further workup when appropriate. Critical Value/emergent results were called by telephone at the time of interpretation on 02/02/2023 at 3:03 pm to provider Dr. Hyacinth Meeker, who verbally acknowledged these results. Electronically Signed   By: Karen Kays M.D.   On: 02/02/2023 18:05   DG Chest Port 1 View  Result Date: 02/02/2023 CLINICAL DATA:  Chest pain. EXAM: PORTABLE CHEST 1 VIEW COMPARISON:  Chest x-ray 09/15/2022. FINDINGS: Similar probable small left pleural effusion. Similar linear/streaky left basilar opacities. No consolidation. No visible pneumothorax. Similar cardiomediastinal silhouette. IMPRESSION: Similar probable small left pleural effusion with mild subsegmental left basilar atelectasis. Electronically Signed   By: Feliberto Harts M.D.   On: 02/02/2023 15:37    Procedures Procedures    Medications Ordered in ED Medications  sodium chloride 0.9 % with cefTRIAXone (ROCEPHIN) ADS Med (has no administration in time range)  levothyroxine (SYNTHROID) tablet 137 mcg (has no administration in time range)  heparin injection 5,000 Units (has no administration in time range)  lactated ringers infusion (has no administration in time range)  cefoTEtan (CEFOTAN) 2 g in sodium chloride 0.9 % 100 mL IVPB (has no administration in time range)  morphine (PF) 2 MG/ML injection 2-4 mg (has no administration in time range)   diphenhydrAMINE (BENADRYL) 12.5 MG/5ML elixir 12.5 mg (has no administration in time range)    Or  diphenhydrAMINE (BENADRYL) injection 12.5 mg (has no administration in time range)  ondansetron (ZOFRAN-ODT) disintegrating tablet 4 mg (has no administration in time range)    Or  ondansetron (ZOFRAN) injection 4 mg (has no administration in time range)  pantoprazole (PROTONIX) injection 40 mg (has no administration in time range)  metoprolol tartrate (LOPRESSOR) injection 5 mg (has no administration in time range)  Chlorhexidine Gluconate Cloth 2 % PADS 6 each (has no administration in time range)  sodium chloride 0.9 % bolus 500 mL (0 mLs Intravenous Stopped 02/02/23 1532)  ondansetron (ZOFRAN) injection 4 mg (4 mg Intravenous Given 02/02/23 1454)  morphine (PF) 4 MG/ML injection 4 mg (4 mg Intravenous Given 02/02/23 1455)  pantoprazole (PROTONIX) injection 40 mg (40 mg Intravenous Given 02/02/23  1455)  fentaNYL (SUBLIMAZE) injection 25 mcg (25 mcg Intravenous Given 02/02/23 1745)  iohexol (OMNIPAQUE) 300 MG/ML solution 100 mL (100 mLs Intravenous Contrast Given 02/02/23 1728)  morphine (PF) 4 MG/ML injection 4 mg (4 mg Intravenous Given 02/02/23 1817)  lactated ringers bolus 500 mL (0 mLs Intravenous Stopped 02/02/23 1936)  cefTRIAXone (ROCEPHIN) 2 g in sodium chloride 0.9 % 100 mL IVPB (2 g Intravenous New Bag/Given 02/02/23 1956)  HYDROmorphone (DILAUDID) injection 1 mg (1 mg Intravenous Given 02/02/23 1908)    ED Course/ Medical Decision Making/ A&P                             Medical Decision Making This patient presents to the ED for concern of abdominal pain vomiting back pain, this involves an extensive number of treatment options, and is a complaint that carries with it a high risk of complications and morbidity.  The differential diagnosis includes gastritis, gastroenteritis, pancreatitis, ischemic bowel, ACS, other   Co morbidities that complicate the patient evaluation  History of  gastric bypass, history of hernia   Additional history obtained:  Additional history obtained from EMR External records from outside source obtained and reviewed including her ED visit for chest pain including workup with PE study that was negative   Lab Tests:  I Ordered, and personally interpreted labs.  The pertinent results include: BC shows mild leukocytosis, CMP shows slight hypokalemia, lipase is normal, troponin negative, lactic acid initially elevated at 2.7, decreased after fluids to 1.8   Imaging Studies ordered:  I ordered imaging studies including CT abdomen pelvis shows possible ischemic bowel urgent findings were communicated to my attending Dr. Hyacinth Meeker   Cardiac Monitoring: / EKG:  The patient was maintained on a cardiac monitor.  I personally viewed and interpreted the cardiac monitored which showed an underlying rhythm of: Sinus rhythm   Consultations Obtained:  I requested consultation with the Dr. Larae Grooms general surgeon, by Dr. Hyacinth Meeker, she is on taking patient to the OR   Problem List / ED Course / Critical interventions / Medication management  Ischemic bowel-patient going to the OR, is hemodynamically stable, pain controlled with morphine. Reevaluation of the patient after these medicines showed that the patient improved I have reviewed the patients home medicines and have made adjustments as needed      Amount and/or Complexity of Data Reviewed Labs: ordered. Radiology: ordered.  Risk Prescription drug management. Decision regarding hospitalization.           Final Clinical Impression(s) / ED Diagnoses Final diagnoses:  SBO (small bowel obstruction)    Rx / DC Orders ED Discharge Orders     None         Josem Kaufmann 02/02/23 2303    Eber Hong, MD 02/04/23 2256

## 2023-02-02 NOTE — ED Notes (Signed)
Dr. Bridges at bedside 

## 2023-02-02 NOTE — ED Provider Notes (Signed)
MSE was initiated and I personally evaluated the patient and placed orders (if any) at  2:44 PM on February 02, 2023.  The patient appears stable so that the remainder of the MSE may be completed by another provider.  72 year old female with history of gastric bypass cholecystectomy ascending aortic aneurysm here with severe upper abdominal pain that started last evening.  Occurred after a meal and recurred again this morning after eating breakfast.  She said she has had this pain before and she seen a cardiologist for it.  Lungs clear heart is regular and bradycardic she has significant tenderness subxiphoid area.  Abdomen is otherwise soft without masses guarding or rebound.   Terrilee Files, MD 02/02/23 [phone]

## 2023-02-02 NOTE — H&P (Addendum)
[facility] History and Physical  Reason for Referral: Ischemic bowel  Referring Physician:  Dr. Hyacinth Meeker   Chief Complaint   Abdominal Pain     Kayla Dean is a 72 y.o. female.  HPI: Kayla Dean is a 72 yo with a history of Gastric bypass, hernia repair in 2018 and revision soon after of one of Kayla anastomosis and subsequent hernia repair with mesh and possible component separation. I cannot find Kayla operative notes in Care Everywhere. Her surgeries were done at [facility]. She lost about 60 lbs she reports.  She comes in with worsening abdominal pain that started last night and got worse. She has had some vomiting.   She is having severe abdominal pain now and CT is concerned for ischemic bowel. She is here with her daughter.    Past Medical History:  Diagnosis Date   Arthritis    H/O gastric bypass    Hiatal hernia    Hypothyroidism    Small bowel obstruction (HCC)     Past Surgical History:  Procedure Laterality Date   ABDOMINAL HYSTERECTOMY     CHOLECYSTECTOMY     COLON SURGERY     HERNIA REPAIR      Family History  Problem Relation Age of Onset   CAD Father    CAD Brother     Social History   Tobacco Use   Smoking status: Never   Smokeless tobacco: Never  Substance Use Topics   Alcohol use: Never   Drug use: Never    Medications: I have reviewed Kayla patient's current medications. Current Facility-Administered Medications  Medication Dose Route Frequency Provider Last Rate Last Admin   [START ON 02/03/2023] cefTRIAXone (ROCEPHIN) 2 g in sodium chloride 0.9 % 100 mL IVPB  2 g Intravenous On Call to OR Lucretia Roers, MD       Chlorhexidine Gluconate Cloth 2 % PADS 6 each  6 each Topical Once Lucretia Roers, MD       HYDROmorphone (DILAUDID) injection 1 mg  1 mg Intravenous Once Lucretia Roers, MD       potassium chloride 10 mEq in 100 mL IVPB  10 mEq Intravenous Q1 Hr x 3 Beatty, Celeste A, PA-C 100 mL/hr at 02/02/23 1821 10 mEq at  02/02/23 1821   Current Outpatient Medications  Medication Sig Dispense Refill Last Dose   aspirin EC 81 MG EC tablet Take 1 tablet (81 mg total) by mouth daily. (Patient taking differently: Take 81 mg by mouth. "Very seldom") 30 tablet 1    Calcium Carb-Cholecalciferol (CALCIUM 600 + D) 600-200 MG-UNIT TABS Take 2 tablets by mouth every morning.      ferrous sulfate 325 (65 FE) MG tablet Take 325 mg by mouth every morning.      levothyroxine (SYNTHROID, LEVOTHROID) 137 MCG tablet Take 137 mcg by mouth daily before breakfast.      metoprolol tartrate (LOPRESSOR) 50 MG tablet Take 1 tablet by mouth 2 hours prior to CT Scan 1 tablet 0    Multiple Vitamin (MULTIVITAMIN WITH MINERALS) TABS tablet Take 1 tablet by mouth every morning.       No Known Allergies    ROS:  A comprehensive review of systems was negative except for: Gastrointestinal: positive for abdominal pain, nausea, and vomiting  Blood pressure (!) 170/94, pulse 74, temperature 97.9 F (36.6 C), temperature source Oral, resp. rate (!) 21, weight 67.1 kg, SpO2 97 %. Physical Exam Vitals reviewed.  HENT:  Head: Normocephalic.  Cardiovascular:     Rate and Rhythm: Normal rate and regular rhythm.  Pulmonary:     Effort: Pulmonary effort is normal.  Abdominal:     General: There is distension.     Palpations: Abdomen is soft.     Tenderness: There is generalized abdominal tenderness and tenderness in Kayla epigastric area. There is guarding. There is no rebound.  Skin:    General: Skin is warm.  Neurological:     General: No focal deficit present.     Mental Status: She is alert and oriented to person, place, and time.  Psychiatric:        Mood and Affect: Mood normal.        Behavior: Behavior normal.     Results: Results for orders placed or performed during Kayla hospital encounter of 02/02/23 (from Kayla past 48 hour(s))  Comprehensive metabolic panel     Status: Abnormal   Collection Time: 02/02/23  3:14 PM   Result Value Ref Range   Sodium 139 135 - 145 mmol/L   Potassium 3.2 (L) 3.5 - 5.1 mmol/L   Chloride 107 98 - 111 mmol/L   CO2 21 (L) 22 - 32 mmol/L   Glucose, Bld 159 (H) 70 - 99 mg/dL    Comment: Glucose reference range applies only to samples taken after fasting for at least 8 hours.   BUN 14 8 - 23 mg/dL   Creatinine, Ser 2.70 0.44 - 1.00 mg/dL   Calcium 8.8 (L) 8.9 - 10.3 mg/dL   Total Protein 6.7 6.5 - 8.1 g/dL   Albumin 3.9 3.5 - 5.0 g/dL   AST 23 15 - 41 U/L   ALT 23 0 - 44 U/L   Alkaline Phosphatase 63 38 - 126 U/L   Total Bilirubin 0.6 0.3 - 1.2 mg/dL   GFR, Estimated >35 >00 mL/min    Comment: (NOTE) Calculated using Kayla CKD-EPI Creatinine Equation (2021)    Anion gap 11 5 - 15    Comment: Performed at [facility], [address], [place], [place] [address]  Lipase, blood     Status: None   Collection Time: 02/02/23  3:14 PM  Result Value Ref Range   Lipase 24 11 - 51 U/L    Comment: Performed at [facility], [address] [place], [place] [address]  Troponin I (High Sensitivity)     Status: None   Collection Time: 02/02/23  3:14 PM  Result Value Ref Range   Troponin I (High Sensitivity) 2 <18 ng/L    Comment: (NOTE) Elevated high sensitivity troponin I (hsTnI) values and significant  changes across serial measurements may suggest ACS but many other  chronic and acute conditions are known to elevate hsTnI results.  Refer to Kayla "Links" section for chest pain algorithms and additional  guidance. Performed at [facility], [address], [place], [place] [address]   CBC with Differential     Status: Abnormal   Collection Time: 02/02/23  3:14 PM  Result Value Ref Range   WBC 12.6 (H) 4.0 - 10.5 K/uL   RBC 4.48 3.87 - 5.11 MIL/uL   Hemoglobin 13.8 12.0 - 15.0 g/dL   HCT 89.3 81.0 - 17.5 %   MCV 93.3 80.0 - 100.0 fL   MCH 30.8 26.0 - 34.0 pg   MCHC 33.0 30.0 - 36.0 g/dL   RDW 10.2 58.5 - 27.7 %   Platelets 181 150 - 400 K/uL   nRBC 0.0 0.0 - 0.2 %  Neutrophils Relative % 82 %   Neutro Abs 10.6 (H) 1.7 - 7.7 K/uL   Lymphocytes Relative 11 %   Lymphs Abs 1.3 0.7 - 4.0 K/uL   Monocytes Relative 5 %   Monocytes Absolute 0.6 0.1 - 1.0 K/uL   Eosinophils Relative 0 %   Eosinophils Absolute 0.0 0.0 - 0.5 K/uL   Basophils Relative 1 %   Basophils Absolute 0.1 0.0 - 0.1 K/uL   Immature Granulocytes 1 %   Abs Immature Granulocytes 0.06 0.00 - 0.07 K/uL    Comment: Performed at [facility], [address], [place], [place] [address]  Lactic acid, plasma     Status: Abnormal   Collection Time: 02/02/23  3:14 PM  Result Value Ref Range   Lactic Acid, Venous 2.7 (HH) 0.5 - 1.9 mmol/L    Comment: CRITICAL RESULT CALLED TO, READ BACK BY AND VERIFIED WITH KING, C AT 1622 ON 02/02/23 BY SMN. Performed at [facility], [address] [place], [place] [address]   Lactic acid, plasma     Status: None   Collection Time: 02/02/23  4:33 PM  Result Value Ref Range   Lactic Acid, Venous 1.8 0.5 - 1.9 mmol/L    Comment: Performed at [facility], [address] [place], [place] [address]  Troponin I (High Sensitivity)     Status: None   Collection Time: 02/02/23  4:33 PM  Result Value Ref Range   Troponin I (High Sensitivity) 2 <18 ng/L    Comment: (NOTE) Elevated high sensitivity troponin I (hsTnI) values and significant  changes across serial measurements may suggest ACS but many other  chronic and acute conditions are known to elevate hsTnI results.  Refer to Kayla "Links" section for chest pain algorithms and additional  guidance. Performed at [facility], [address], [place], [place] [address]    Personally reviewed and reviewed with patient and her daughter- showed Kayla bowel and Kayla concern for ischemic, showed where I think mesh has pulled apart.  CT ABDOMEN PELVIS W CONTRAST  Result Date: 02/02/2023 CLINICAL DATA:  Re i re EXAM: CT ABDOMEN AND PELVIS WITH CONTRAST TECHNIQUE: Multidetector CT imaging of Kayla abdomen and pelvis was  performed using Kayla standard protocol following bolus administration of intravenous contrast. RADIATION DOSE REDUCTION: This exam was performed according to Kayla departmental dose-optimization program which includes automated exposure control, adjustment of the mA and/or kV according to patient size and/or use of iterative reconstruction technique. CONTRAST:  OMNIPAQUE IOHEXOL 300 MG/ML  SOLN COMPARISON:  None Available. FINDINGS: Lower chest: Bandlike opacity lung bases likely scar atelectasis. No pleural effusion. Hepatobiliary: Mild biliary ductal dilatation. There is some tapering of Kayla common duct towards Kayla pancreatic head. Kayla gallbladder is absent. Pancreas: Moderate atrophy of Kayla pancreas. Spleen: Normal in size without focal abnormality. Adrenals/Urinary Tract: Nodular appearance of Kayla adrenal glands. Focus on Kayla right previously had Hounsfield unit of 2 consistent with an adenoma. Focus on Kayla left previously Hounsfield units of 7. No additional follow-up. No enhancing renal mass or collecting system dilatation. 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 Small hiatal hernia. There is some fluid tracking along Kayla lower mediastinum. Jejunostomy limb extends down and has focal area of dilatation with epigastric midline anterior abdominal wall hernia. This loop is fluid-filled and dilated up to 4.7 cm. In addition in Kayla walls of Kayla this loop of dilated bowel poorly enhances. No pneumatosis or free air at this time but changes are worrisome  for developing ischemia. There are distal loops of bowel which are nondilated consistent with a contributing obstruction and caliber change at Kayla level of Kayla epigastric hernia. Please see focal caliber change and sagittal image 67 of series 6. Again Kayla colon is nondilated. Kayla excluded stomach also has some subtle wall thickening but is nondilated. Vascular/Lymphatic: Normal caliber aorta and IVC. Mild vascular  calcifications. No specific abnormal lymph node enlargement identified in Kayla abdomen and pelvis. Reproductive: Status post hysterectomy. No adnexal masses. Other: No free intra-abdominal air. There is scattered ascites throughout Kayla mesentery and into Kayla pelvis. Fluid in Kayla mesentery is in Kayla area of abnormal bowel loops. Musculoskeletal: Curvature of Kayla spine with degenerative changes. Streak artifact related to Kayla Dean obscuring portions of Kayla right hemipelvis. IMPRESSION: Surgical changes of previous gastric bypass with Kayla esophageal jejunostomy. There are several dilated loops of small bowel in Kayla upper abdomen with an epigastric hernia and focal caliber change. Possibly incarcerated hernia/focal bowel obstruction. In addition there is severe mesenteric fluid in this location and areas of Kayla walls of Kayla bowel that are poorly enhancing compared to other loops. Early changes of ischemia are possible. Please correlate with clinical findings. Previous cholecystectomy. Dilated biliary tree but tapering towards Kayla pancreatic head. Please correlate with any prior but this is progressive from a chest CT scan of 09/15/2022 recommend further workup when appropriate. Critical Value/emergent results were called by telephone at Kayla time of interpretation on 02/02/2023 at 3:03 pm to provider Dr. Hyacinth Meeker, who verbally acknowledged these results. Electronically Signed   By: Karen Kays M.D.   On: 02/02/2023 18:05   DG Chest Port 1 View  Result Date: 02/02/2023 CLINICAL DATA:  Chest pain. EXAM: PORTABLE CHEST 1 VIEW COMPARISON:  Chest x-ray 09/15/2022. FINDINGS: Similar probable small left pleural effusion. Similar linear/streaky left basilar opacities. No consolidation. No visible pneumothorax. Similar cardiomediastinal silhouette. IMPRESSION: Similar probable small left pleural effusion with mild subsegmental left basilar atelectasis. Electronically Signed   By: Feliberto Harts  M.D.   On: 02/02/2023 15:37     Assessment & Plan:  DEREKA LUERAS is a 72 y.o. female with concern for ischemic bowel in Kayla setting of a gastric bypass and multiple hernia repairs, possible component separation.   Discussed risk of bleeding, infection, bowel resection, needing to stay in discontinuity if I am unable to do an anastomosis, need to stay intubated and transfer, need to do a central line or arterial line, risk of bleeding, infection, injury to vessels, pneumothorax. Discussed high risk of hernia formation given her history. Discussed risk of her getting sicker before she gets better.   All questions were answered to Kayla satisfaction of Kayla patient and family.   Lucretia Roers 02/02/2023, 7:02 PM

## 2023-02-02 NOTE — Anesthesia Postprocedure Evaluation (Signed)
Anesthesia Post Note  Patient: TRANISHA TISSUE  Procedure(s) Performed: EXPLORATORY LAPAROTOMY, [name], EXPLANT OF ABDOMINAL MESH (Abdomen)  Patient location during evaluation: PACU Anesthesia Type: General Level of consciousness: awake and alert and oriented Pain management: pain level controlled Vital Signs Assessment: post-procedure vital signs reviewed and stable Respiratory status: spontaneous breathing, nonlabored ventilation, respiratory function stable and patient connected to face mask oxygen Cardiovascular status: blood pressure returned to baseline and stable Postop Assessment: no apparent nausea or vomiting Anesthetic complications: no  No notable events documented.   Last Vitals:  Vitals:   02/02/23 2157 02/02/23 2200  BP: 116/89 125/82  Pulse: 94 96  Resp: 12 11  Temp:    SpO2: 100% 100%    Last Pain:  Vitals:   02/02/23 2145  TempSrc:   PainSc: Asleep                 Janann Boeve C Ayano Douthitt

## 2023-02-03 DIAGNOSIS — K7200 Acute and subacute hepatic failure without coma: Secondary | ICD-10-CM

## 2023-02-03 HISTORY — DX: Acute and subacute hepatic failure without coma: K72.00

## 2023-02-03 LAB — CBC WITH DIFFERENTIAL/PLATELET
Abs Immature Granulocytes: 0.07 10*3/uL (ref 0.00–0.07)
Basophils Absolute: 0 10*3/uL (ref 0.0–0.1)
Basophils Relative: 0 %
Eosinophils Absolute: 0 10*3/uL (ref 0.0–0.5)
Eosinophils Relative: 0 %
HCT: 47 % — ABNORMAL HIGH (ref 36.0–46.0)
Hemoglobin: 15.2 g/dL — ABNORMAL HIGH (ref 12.0–15.0)
Immature Granulocytes: 0 %
Lymphocytes Relative: 5 %
Lymphs Abs: 0.8 10*3/uL (ref 0.7–4.0)
MCH: 30.9 pg (ref 26.0–34.0)
MCHC: 32.3 g/dL (ref 30.0–36.0)
MCV: 95.5 fL (ref 80.0–100.0)
Monocytes Absolute: 1 10*3/uL (ref 0.1–1.0)
Monocytes Relative: 6 %
Neutro Abs: 15.8 10*3/uL — ABNORMAL HIGH (ref 1.7–7.7)
Neutrophils Relative %: 89 %
Platelets: 199 10*3/uL (ref 150–400)
RBC: 4.92 MIL/uL (ref 3.87–5.11)
RDW: 13.2 % (ref 11.5–15.5)
WBC: 17.7 10*3/uL — ABNORMAL HIGH (ref 4.0–10.5)
nRBC: 0 % (ref 0.0–0.2)

## 2023-02-03 LAB — COMPREHENSIVE METABOLIC PANEL
ALT: 746 U/L — ABNORMAL HIGH (ref 0–44)
AST: 980 U/L — ABNORMAL HIGH (ref 15–41)
Albumin: 2.9 g/dL — ABNORMAL LOW (ref 3.5–5.0)
Alkaline Phosphatase: 77 U/L (ref 38–126)
Anion gap: 9 (ref 5–15)
BUN: 14 mg/dL (ref 8–23)
CO2: 22 mmol/L (ref 22–32)
Calcium: 8.2 mg/dL — ABNORMAL LOW (ref 8.9–10.3)
Chloride: 107 mmol/L (ref 98–111)
Creatinine, Ser: 0.69 mg/dL (ref 0.44–1.00)
GFR, Estimated: >60 mL/min (ref >60)
Glucose, Bld: 186 mg/dL — ABNORMAL HIGH (ref 70–99)
Potassium: 4.3 mmol/L (ref 3.5–5.1)
Sodium: 138 mmol/L (ref 135–145)
Total Bilirubin: 0.8 mg/dL (ref 0.3–1.2)
Total Protein: 5.6 g/dL — ABNORMAL LOW (ref 6.5–8.1)

## 2023-02-03 LAB — MAGNESIUM: Magnesium: 1.6 mg/dL — ABNORMAL LOW (ref 1.7–2.4)

## 2023-02-03 LAB — GLUCOSE, CAPILLARY: Glucose-Capillary: 180 mg/dL — ABNORMAL HIGH (ref 70–99)

## 2023-02-03 LAB — PHOSPHORUS: Phosphorus: 4.1 mg/dL (ref 2.5–4.6)

## 2023-02-03 LAB — MRSA NEXT GEN BY PCR, NASAL: MRSA by PCR Next Gen: NOT DETECTED

## 2023-02-03 MED ORDER — LEVOTHYROXINE SODIUM 137 MCG PO TABS
137.0000 ug | ORAL_TABLET | Freq: Every day | ORAL | Status: DC
  Administered 2023-02-04 – 2023-02-08 (×5): 137 ug via ORAL
  Filled 2023-02-03 (×5): qty 1

## 2023-02-03 MED ORDER — MAGNESIUM SULFATE 2 GM/50ML IV SOLN
2.0000 g | Freq: Once | INTRAVENOUS | Status: AC
  Administered 2023-02-03: 2 g via INTRAVENOUS
  Filled 2023-02-03: qty 50

## 2023-02-03 MED ORDER — LACTATED RINGERS IV BOLUS
500.0000 mL | Freq: Once | INTRAVENOUS | Status: AC
  Administered 2023-02-03: 500 mL via INTRAVENOUS

## 2023-02-03 NOTE — Progress Notes (Addendum)
[facility] Progress Note  1 Day Post-Op  Subjective: Doing well. Just sore. UOP good. LFTs jumped up on labs. No nausea. BP trending down and HR up, LR bolus now.   Objective: Vital signs in last 24 hours: Temp:  [97.9 F (36.6 C)-99.2 F (37.3 C)] 98.9 F (37.2 C) (04/20 1127) Pulse Rate:  [51-118] 118 (04/20 1127) Resp:  [10-21] 15 (04/20 1127) BP: (97-171)/(69-96) 97/74 (04/20 1053) SpO2:  [93 %-100 %] 97 % (04/20 1127) FiO2 (%):  [28 %] 28 % (04/19 2209) Weight:  [56.4 kg-71.3 kg] 71.3 kg (04/20 0435) Last BM Date : 02/01/23 (per patient)  Intake/Output from previous day: 04/19 0701 - 04/20 0700 In: 3368.1 [I.V.:2668.1; IV Piggyback:700] Out: 830 [Urine:780; Blood:30] Intake/Output this shift: No intake/output data recorded.  General appearance: alert and no distress Resp: normal work of breathing GI: soft, distended, appropriately tender, honeycomb on staple line c/d/I with staples and no erythema or drainage, binder replaced   Lab Results:  Recent Labs    02/02/23 1514 02/03/23 0457  WBC 12.6* 17.7*  HGB 13.8 15.2*  HCT 41.8 47.0*  PLT 181 199   BMET Recent Labs    02/02/23 1514 02/03/23 0457  NA 139 138  K 3.2* 4.3  CL 107 107  CO2 21* 22  GLUCOSE 159* 186*  BUN 14 14  CREATININE 0.62 0.69  CALCIUM 8.8* 8.2*   PT/INR No results for input(s): "LABPROT", "INR" in the last 72 hours.  Studies/Results: DG Chest Port 1 View  Result Date: 02/02/2023 CLINICAL DATA:  Internal hernia status post surgical repair EXAM: PORTABLE CHEST 1 VIEW COMPARISON:  02/02/2023 FINDINGS: The lungs are well expanded. Small left pleural effusion with associated mild left basilar atelectasis. Lungs are otherwise clear. No pneumothorax. No pleural effusion on the right. Cardiac size within normal limits. Pulmonary vascularity is normal. No acute bone abnormality. IMPRESSION: 1. Small left pleural effusion with associated mild left basilar atelectasis.  Electronically Signed   By: Helyn Numbers M.D.   On: 02/02/2023 22:59   CT ABDOMEN PELVIS W CONTRAST  Result Date: 02/02/2023 CLINICAL DATA:  Re i re EXAM: CT ABDOMEN AND PELVIS WITH CONTRAST TECHNIQUE: Multidetector CT imaging of the abdomen and pelvis was performed using the standard protocol following bolus administration of intravenous contrast. RADIATION DOSE REDUCTION: This exam was performed according to the departmental dose-optimization program which includes automated exposure control, adjustment of the mA and/or kV according to patient size and/or use of iterative reconstruction technique. CONTRAST:  OMNIPAQUE IOHEXOL 300 MG/ML  SOLN COMPARISON:  None Available. FINDINGS: Lower chest: Bandlike opacity lung bases likely scar atelectasis. No pleural effusion. Hepatobiliary: Mild biliary ductal dilatation. There is some tapering of the common duct towards the pancreatic head. The gallbladder is absent. Pancreas: Moderate atrophy of the pancreas. Spleen: Normal in size without focal abnormality. Adrenals/Urinary Tract: Nodular appearance of the adrenal glands. Focus on the right previously had Hounsfield unit of 2 consistent with an adenoma. Focus on the left previously Hounsfield units of 7. No additional follow-up. No enhancing renal mass or collecting system dilatation. 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 Small hiatal hernia. There is some fluid tracking along the lower mediastinum. Jejunostomy limb extends down and has focal area of dilatation with epigastric midline anterior abdominal wall hernia. This loop is fluid-filled and dilated up to 4.7 cm. In addition in the walls of the this loop of dilated bowel poorly enhances. No pneumatosis or free air at  this time but changes are worrisome for developing ischemia. There are distal loops of bowel which are nondilated consistent with a contributing obstruction and caliber change at the level of the  epigastric hernia. Please see focal caliber change and sagittal image 67 of series 6. Again the colon is nondilated. The excluded stomach also has some subtle wall thickening but is nondilated. Vascular/Lymphatic: Normal caliber aorta and IVC. Mild vascular calcifications. No specific abnormal lymph node enlargement identified in the abdomen and pelvis. Reproductive: Status post hysterectomy. No adnexal masses. Other: No free intra-abdominal air. There is scattered ascites throughout the mesentery and into the pelvis. Fluid in the mesentery is in the area of abnormal bowel loops. Musculoskeletal: Curvature of the spine with degenerative changes. Streak artifact related to the patient's right hip arthroplasty obscuring portions of the right hemipelvis. IMPRESSION: Surgical changes of previous gastric bypass with the esophageal jejunostomy. There are several dilated loops of small bowel in the upper abdomen with an epigastric hernia and focal caliber change. Possibly incarcerated hernia/focal bowel obstruction. In addition there is severe mesenteric fluid in this location and areas of the walls of the bowel that are poorly enhancing compared to other loops. Early changes of ischemia are possible. Please correlate with clinical findings. Previous cholecystectomy. Dilated biliary tree but tapering towards the pancreatic head. Please correlate with any prior but this is progressive from a chest CT scan of 09/15/2022 recommend further workup when appropriate. Critical Value/emergent results were called by telephone at the time of interpretation on 02/02/2023 at 3:03 pm to provider Dr. Hyacinth Meeker, who verbally acknowledged these results. Electronically Signed   By: Karen Kays M.D.   On: 02/02/2023 18:05   DG Chest Port 1 View  Result Date: 02/02/2023 CLINICAL DATA:  Chest pain. EXAM: PORTABLE CHEST 1 VIEW COMPARISON:  Chest x-ray 09/15/2022. FINDINGS: Similar probable small left pleural effusion. Similar linear/streaky  left basilar opacities. No consolidation. No visible pneumothorax. Similar cardiomediastinal silhouette. IMPRESSION: Similar probable small left pleural effusion with mild subsegmental left basilar atelectasis. Electronically Signed   By: Feliberto Harts M.D.   On: 02/02/2023 15:37    Anti-infectives: Anti-infectives (From admission, onward)    Start     Dose/Rate Route Frequency Ordered Stop   02/03/23 1000  cefoTEtan (CEFOTAN) 2 g in sodium chloride 0.9 % 100 mL IVPB        2 g 200 mL/hr over 30 Minutes Intravenous Every 12 hours 02/02/23 2221 02/06/23 0959   02/03/23 0600  cefTRIAXone (ROCEPHIN) 2 g in sodium chloride 0.9 % 100 mL IVPB        2 g 200 mL/hr over 30 Minutes Intravenous On call to O.R. 02/02/23 1901 02/02/23 2026   02/02/23 1925  sodium chloride 0.9 % with cefTRIAXone (ROCEPHIN) ADS Med       Note to Pharmacy: Berneda Rose M: cabinet override      02/02/23 1925 02/03/23 0729       Assessment/Plan: [name], [name], [name] Doing well. Recovering from episode of ischemic bowel, all bowel viable at conclusion of case.  PRN for pain IS OOB NPO Can have a medicine cup of ice an hour  Awaiting ileus to resolve Labs in AM LR , UOP good  LFTs up, looks like shock liver, will monitor Cefotetan for any translocation  SCDs, heparin sq Will leave in stepdown today    LOS: 1 day    Lucretia Roers 02/03/2023

## 2023-02-03 NOTE — Progress Notes (Signed)
Walked in hall about 150 feet. Tolerated well. Upon returning to room placed in chair and connected back to wall monitor.

## 2023-02-04 LAB — CBC WITH DIFFERENTIAL/PLATELET
Abs Immature Granulocytes: 0.1 10*3/uL — ABNORMAL HIGH (ref 0.00–0.07)
Basophils Absolute: 0 10*3/uL (ref 0.0–0.1)
Basophils Relative: 0 %
Eosinophils Absolute: 0 10*3/uL (ref 0.0–0.5)
Eosinophils Relative: 0 %
HCT: 36.1 % (ref 36.0–46.0)
Hemoglobin: 11.7 g/dL — ABNORMAL LOW (ref 12.0–15.0)
Immature Granulocytes: 1 %
Lymphocytes Relative: 7 %
Lymphs Abs: 1.4 10*3/uL (ref 0.7–4.0)
MCH: 30.8 pg (ref 26.0–34.0)
MCHC: 32.4 g/dL (ref 30.0–36.0)
MCV: 95 fL (ref 80.0–100.0)
Monocytes Absolute: 2.2 10*3/uL — ABNORMAL HIGH (ref 0.1–1.0)
Monocytes Relative: 11 %
Neutro Abs: 16.5 10*3/uL — ABNORMAL HIGH (ref 1.7–7.7)
Neutrophils Relative %: 81 %
Platelets: 153 10*3/uL (ref 150–400)
RBC: 3.8 MIL/uL — ABNORMAL LOW (ref 3.87–5.11)
RDW: 13.4 % (ref 11.5–15.5)
WBC: 20.2 10*3/uL — ABNORMAL HIGH (ref 4.0–10.5)
nRBC: 0 % (ref 0.0–0.2)

## 2023-02-04 LAB — COMPREHENSIVE METABOLIC PANEL
ALT: 264 U/L — ABNORMAL HIGH (ref 0–44)
AST: 130 U/L — ABNORMAL HIGH (ref 15–41)
Albumin: 2.3 g/dL — ABNORMAL LOW (ref 3.5–5.0)
Alkaline Phosphatase: 49 U/L (ref 38–126)
Anion gap: 7 (ref 5–15)
BUN: 20 mg/dL (ref 8–23)
CO2: 26 mmol/L (ref 22–32)
Calcium: 7.9 mg/dL — ABNORMAL LOW (ref 8.9–10.3)
Chloride: 106 mmol/L (ref 98–111)
Creatinine, Ser: 0.73 mg/dL (ref 0.44–1.00)
GFR, Estimated: >60 mL/min (ref >60)
Glucose, Bld: 148 mg/dL — ABNORMAL HIGH (ref 70–99)
Potassium: 3.9 mmol/L (ref 3.5–5.1)
Sodium: 139 mmol/L (ref 135–145)
Total Bilirubin: 0.8 mg/dL (ref 0.3–1.2)
Total Protein: 4.8 g/dL — ABNORMAL LOW (ref 6.5–8.1)

## 2023-02-04 LAB — AEROBIC/ANAEROBIC CULTURE W GRAM STAIN (SURGICAL/DEEP WOUND): Gram Stain: NONE SEEN

## 2023-02-04 LAB — MAGNESIUM: Magnesium: 1.9 mg/dL (ref 1.7–2.4)

## 2023-02-04 LAB — PHOSPHORUS: Phosphorus: 2.8 mg/dL (ref 2.5–4.6)

## 2023-02-04 MED ORDER — OXYCODONE HCL 5 MG PO TABS
5.0000 mg | ORAL_TABLET | ORAL | Status: DC | PRN
  Administered 2023-02-04 – 2023-02-08 (×7): 5 mg via ORAL
  Filled 2023-02-04 (×7): qty 1

## 2023-02-04 MED ORDER — DOCUSATE SODIUM 100 MG PO CAPS
100.0000 mg | ORAL_CAPSULE | Freq: Two times a day (BID) | ORAL | Status: DC
  Administered 2023-02-04 – 2023-02-05 (×2): 100 mg via ORAL
  Filled 2023-02-04 (×2): qty 1

## 2023-02-04 MED ORDER — MORPHINE SULFATE (PF) 2 MG/ML IV SOLN
2.0000 mg | INTRAVENOUS | Status: DC | PRN
  Administered 2023-02-04: 2 mg via INTRAVENOUS
  Administered 2023-02-05 – 2023-02-06 (×2): 4 mg via INTRAVENOUS
  Filled 2023-02-04: qty 2
  Filled 2023-02-04: qty 1
  Filled 2023-02-04: qty 2

## 2023-02-04 NOTE — Progress Notes (Signed)
Patient had small loose stool brown with maroon tinge. Dr. Thomes Dinning notified. Will continue to monitor.

## 2023-02-04 NOTE — Progress Notes (Addendum)
[facility]  Saw on vitals a HR of 170, came up to investigate. Patient had been walking around the time but looks like HR got up to 120s and the 170 was reading from the pulse ox that was not recording well, sat monitor was not reading correctly and read sats were 80s.   She is doing well now. Her pain is improving she says. Current HR low 100s, O2 90s.   Algis Greenhouse, MD [facility] [address] [place], [place] [address] [phone] (office)

## 2023-02-04 NOTE — Progress Notes (Signed)
[facility] Progress Note  2 Days Post-Op  Subjective: Pain better but right side is sore she reports. Ambulating in the hall. Family at bedside. Making urine. BP is soft at times especially with morphine. BP repeated in the room while I was there and 114 systolic.   Objective: Vital signs in last 24 hours: Temp:  [97.8 F (36.6 C)-99.1 F (37.3 C)] 98.8 F (37.1 C) (04/21 1135) Pulse Rate:  [54-129] 102 (04/21 1135) Resp:  [14-23] 19 (04/21 1135) BP: (90-154)/(52-112) 114/68 (04/21 0600) SpO2:  [92 %-100 %] 93 % (04/21 1135) Weight:  [73.1 kg] 73.1 kg (04/21 0418) Last BM Date : 02/01/23  Intake/Output from previous day: 04/20 0701 - 04/21 0700 In: 3017.6 [P.O.:150; I.V.:2142.5; IV Piggyback:725.1] Out: 550 [Urine:550] Intake/Output this shift: Total I/O In: 122.2 [I.V.:122.2] Out: 175 [Urine:175]  General appearance: alert and no distress Resp: normal work of breathing GI: soft, distended, some right sided tenderness, no rebound or guarding   Lab Results:  Recent Labs    02/03/23 0457 02/04/23 0327  WBC 17.7* 20.2*  HGB 15.2* 11.7*  HCT 47.0* 36.1  PLT 199 153   BMET Recent Labs    02/03/23 0457 02/04/23 0327  NA 138 139  K 4.3 3.9  CL 107 106  CO2 22 26  GLUCOSE 186* 148*  BUN 14 20  CREATININE 0.69 0.73  CALCIUM 8.2* 7.9*   PT/INR No results for input(s): "LABPROT", "INR" in the last 72 hours.  Studies/Results: DG Chest Port 1 View  Result Date: 02/02/2023 CLINICAL DATA:  Internal hernia status post surgical repair EXAM: PORTABLE CHEST 1 VIEW COMPARISON:  02/02/2023 FINDINGS: The lungs are well expanded. Small left pleural effusion with associated mild left basilar atelectasis. Lungs are otherwise clear. No pneumothorax. No pleural effusion on the right. Cardiac size within normal limits. Pulmonary vascularity is normal. No acute bone abnormality. IMPRESSION: 1. Small left pleural effusion with associated mild left basilar  atelectasis. Electronically Signed   By: Helyn Numbers M.D.   On: 02/02/2023 22:59   CT ABDOMEN PELVIS W CONTRAST  Result Date: 02/02/2023 CLINICAL DATA:  Re i re EXAM: CT ABDOMEN AND PELVIS WITH CONTRAST TECHNIQUE: Multidetector CT imaging of the abdomen and pelvis was performed using the standard protocol following bolus administration of intravenous contrast. RADIATION DOSE REDUCTION: This exam was performed according to the departmental dose-optimization program which includes automated exposure control, adjustment of the mA and/or kV according to patient size and/or use of iterative reconstruction technique. CONTRAST:  OMNIPAQUE IOHEXOL 300 MG/ML  SOLN COMPARISON:  None Available. FINDINGS: Lower chest: Bandlike opacity lung bases likely scar atelectasis. No pleural effusion. Hepatobiliary: Mild biliary ductal dilatation. There is some tapering of the common duct towards the pancreatic head. The gallbladder is absent. Pancreas: Moderate atrophy of the pancreas. Spleen: Normal in size without focal abnormality. Adrenals/Urinary Tract: Nodular appearance of the adrenal glands. Focus on the right previously had Hounsfield unit of 2 consistent with an adenoma. Focus on the left previously Hounsfield units of 7. No additional follow-up. No enhancing renal mass or collecting system dilatation. 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 Small hiatal hernia. There is some fluid tracking along the lower mediastinum. Jejunostomy limb extends down and has focal area of dilatation with epigastric midline anterior abdominal wall hernia. This loop is fluid-filled and dilated up to 4.7 cm. In addition in the walls of the this loop of dilated bowel poorly enhances. No pneumatosis or free air at  this time but changes are worrisome for developing ischemia. There are distal loops of bowel which are nondilated consistent with a contributing obstruction and caliber change at the  level of the epigastric hernia. Please see focal caliber change and sagittal image 67 of series 6. Again the colon is nondilated. The excluded stomach also has some subtle wall thickening but is nondilated. Vascular/Lymphatic: Normal caliber aorta and IVC. Mild vascular calcifications. No specific abnormal lymph node enlargement identified in the abdomen and pelvis. Reproductive: Status post hysterectomy. No adnexal masses. Other: No free intra-abdominal air. There is scattered ascites throughout the mesentery and into the pelvis. Fluid in the mesentery is in the area of abnormal bowel loops. Musculoskeletal: Curvature of the spine with degenerative changes. Streak artifact related to the patient's right hip arthroplasty obscuring portions of the right hemipelvis. IMPRESSION: Surgical changes of previous gastric bypass with the esophageal jejunostomy. There are several dilated loops of small bowel in the upper abdomen with an epigastric hernia and focal caliber change. Possibly incarcerated hernia/focal bowel obstruction. In addition there is severe mesenteric fluid in this location and areas of the walls of the bowel that are poorly enhancing compared to other loops. Early changes of ischemia are possible. Please correlate with clinical findings. Previous cholecystectomy. Dilated biliary tree but tapering towards the pancreatic head. Please correlate with any prior but this is progressive from a chest CT scan of 09/15/2022 recommend further workup when appropriate. Critical Value/emergent results were called by telephone at the time of interpretation on 02/02/2023 at 3:03 pm to provider Dr. Hyacinth Meeker, who verbally acknowledged these results. Electronically Signed   By: Karen Kays M.D.   On: 02/02/2023 18:05   DG Chest Port 1 View  Result Date: 02/02/2023 CLINICAL DATA:  Chest pain. EXAM: PORTABLE CHEST 1 VIEW COMPARISON:  Chest x-ray 09/15/2022. FINDINGS: Similar probable small left pleural effusion. Similar  linear/streaky left basilar opacities. No consolidation. No visible pneumothorax. Similar cardiomediastinal silhouette. IMPRESSION: Similar probable small left pleural effusion with mild subsegmental left basilar atelectasis. Electronically Signed   By: Feliberto Harts M.D.   On: 02/02/2023 15:37    Anti-infectives: Anti-infectives (From admission, onward)    Start     Dose/Rate Route Frequency Ordered Stop   02/03/23 1000  cefoTEtan (CEFOTAN) 2 g in sodium chloride 0.9 % 100 mL IVPB        2 g 200 mL/hr over 30 Minutes Intravenous Every 12 hours 02/02/23 2221 02/06/23 0959   02/03/23 0600  cefTRIAXone (ROCEPHIN) 2 g in sodium chloride 0.9 % 100 mL IVPB        2 g 200 mL/hr over 30 Minutes Intravenous On call to O.R. 02/02/23 1901 02/02/23 2026   02/02/23 1925  sodium chloride 0.9 % with cefTRIAXone (ROCEPHIN) ADS Med       Note to Pharmacy: Berneda Rose M: cabinet override      02/02/23 1925 02/03/23 0729       Assessment/Plan: Patient POD 2 s/p  EXPLORATORY LAPAROTOMY, [name], [name] Doing fair. Has a leukocytosis prob stress response. Following. No fevers, some pain but reassuring. Discussed that she has ileus and awaiting bowel function to return. Discussed if she started having worse pain or any worsening signs this could prompt repeat imaging or surgery if she had bowel that perforated despite looking viable during the first surgery. Again unlikely but possible.   PRN For pain Is, OOB Walking Step down, telemetry tomorrow like NPO Ice and sips with  meds Awaiting bowel function Labs tomorrow Cefotetan for any translocation, ascites fluid negative to date UOP good, Lr  LFTs trending down, likely some shock liver  SCDs, heparin sq   Updated family patient and team.   LOS: 2 days    Kayla Dean 02/04/2023

## 2023-02-05 LAB — BASIC METABOLIC PANEL
Anion gap: 6 (ref 5–15)
BUN: 12 mg/dL (ref 8–23)
CO2: 29 mmol/L (ref 22–32)
Calcium: 8 mg/dL — ABNORMAL LOW (ref 8.9–10.3)
Chloride: 106 mmol/L (ref 98–111)
Creatinine, Ser: 0.53 mg/dL (ref 0.44–1.00)
GFR, Estimated: >60 mL/min (ref >60)
Glucose, Bld: 98 mg/dL (ref 70–99)
Potassium: 3.6 mmol/L (ref 3.5–5.1)
Sodium: 141 mmol/L (ref 135–145)

## 2023-02-05 LAB — CBC WITH DIFFERENTIAL/PLATELET
Abs Immature Granulocytes: 0.04 10*3/uL (ref 0.00–0.07)
Basophils Absolute: 0 10*3/uL (ref 0.0–0.1)
Basophils Relative: 0 %
Eosinophils Absolute: 0 10*3/uL (ref 0.0–0.5)
Eosinophils Relative: 0 %
HCT: 28.6 % — ABNORMAL LOW (ref 36.0–46.0)
Hemoglobin: 9.2 g/dL — ABNORMAL LOW (ref 12.0–15.0)
Immature Granulocytes: 0 %
Lymphocytes Relative: 10 %
Lymphs Abs: 1.1 10*3/uL (ref 0.7–4.0)
MCH: 31 pg (ref 26.0–34.0)
MCHC: 32.2 g/dL (ref 30.0–36.0)
MCV: 96.3 fL (ref 80.0–100.0)
Monocytes Absolute: 1.2 10*3/uL — ABNORMAL HIGH (ref 0.1–1.0)
Monocytes Relative: 11 %
Neutro Abs: 8.1 10*3/uL — ABNORMAL HIGH (ref 1.7–7.7)
Neutrophils Relative %: 79 %
Platelets: 123 10*3/uL — ABNORMAL LOW (ref 150–400)
RBC: 2.97 MIL/uL — ABNORMAL LOW (ref 3.87–5.11)
RDW: 13.7 % (ref 11.5–15.5)
WBC: 10.4 10*3/uL (ref 4.0–10.5)
nRBC: 0 % (ref 0.0–0.2)

## 2023-02-05 LAB — AEROBIC/ANAEROBIC CULTURE W GRAM STAIN (SURGICAL/DEEP WOUND)

## 2023-02-05 LAB — PHOSPHORUS: Phosphorus: 1.6 mg/dL — ABNORMAL LOW (ref 2.5–4.6)

## 2023-02-05 LAB — MAGNESIUM: Magnesium: 1.9 mg/dL (ref 1.7–2.4)

## 2023-02-05 MED ORDER — SODIUM CHLORIDE 0.9 % IV BOLUS
500.0000 mL | Freq: Once | INTRAVENOUS | Status: AC
  Administered 2023-02-05: 500 mL via INTRAVENOUS

## 2023-02-05 MED ORDER — POTASSIUM & SODIUM PHOSPHATES 280-160-250 MG PO PACK
2.0000 | PACK | Freq: Three times a day (TID) | ORAL | Status: AC
  Administered 2023-02-05 (×3): 2 via ORAL
  Filled 2023-02-05 (×4): qty 2

## 2023-02-05 NOTE — Progress Notes (Signed)
[facility]  Dark maroon stools reported. More this Am. Abdomen feeling less sore. No nausea.   BP 119/67   Pulse 99   Temp 98.1 F (36.7 C) (Oral)   Resp 20   Ht  (1.651 m)   Wt 73.1 kg   SpO2 96%   BMI 26.82 kg/m  Soft, nondistended, midline with staples no erythema or drainage, honeycomb in place, binder replaced  Labs reassuring, leukocytosis down H&H drifting too, will monitor with loose stools  Patient POD 2 s/p  EXPLORATORY LAPAROTOMY, [name], [name] Doing fair.    PRN For pain Is, OOB Step down, telemetry  Soft BP at times, often cuff is positional  Clears now, full liquids later Colace stopped with liquid stools  Labs tomorrow Cefotetan for any translocation, ascites fluid negative to date, course completed  UOP good, Lr down to 50  LFTs trending down, repeat tomorrow  SCDs, heparin sq  Ambulate  Kayla Greenhouse, MD [facility] [address] [place], [place] [address] [phone] (office)

## 2023-02-06 ENCOUNTER — Encounter (HOSPITAL_COMMUNITY): Payer: Self-pay | Admitting: General Surgery

## 2023-02-06 LAB — CBC WITH DIFFERENTIAL/PLATELET
Abs Immature Granulocytes: 0.04 10*3/uL (ref 0.00–0.07)
Basophils Absolute: 0 10*3/uL (ref 0.0–0.1)
Basophils Relative: 1 %
Eosinophils Absolute: 0.1 10*3/uL (ref 0.0–0.5)
Eosinophils Relative: 2 %
HCT: 27 % — ABNORMAL LOW (ref 36.0–46.0)
Hemoglobin: 8.5 g/dL — ABNORMAL LOW (ref 12.0–15.0)
Immature Granulocytes: 1 %
Lymphocytes Relative: 23 %
Lymphs Abs: 1.5 10*3/uL (ref 0.7–4.0)
MCH: 30.1 pg (ref 26.0–34.0)
MCHC: 31.5 g/dL (ref 30.0–36.0)
MCV: 95.7 fL (ref 80.0–100.0)
Monocytes Absolute: 0.8 10*3/uL (ref 0.1–1.0)
Monocytes Relative: 13 %
Neutro Abs: 4 10*3/uL (ref 1.7–7.7)
Neutrophils Relative %: 60 %
Platelets: 131 10*3/uL — ABNORMAL LOW (ref 150–400)
RBC: 2.82 MIL/uL — ABNORMAL LOW (ref 3.87–5.11)
RDW: 13.5 % (ref 11.5–15.5)
WBC: 6.4 10*3/uL (ref 4.0–10.5)
nRBC: 0 % (ref 0.0–0.2)

## 2023-02-06 LAB — COMPREHENSIVE METABOLIC PANEL
ALT: 92 U/L — ABNORMAL HIGH (ref 0–44)
AST: 29 U/L (ref 15–41)
Albumin: 2 g/dL — ABNORMAL LOW (ref 3.5–5.0)
Alkaline Phosphatase: 38 U/L (ref 38–126)
Anion gap: 7 (ref 5–15)
BUN: 8 mg/dL (ref 8–23)
CO2: 29 mmol/L (ref 22–32)
Calcium: 7.6 mg/dL — ABNORMAL LOW (ref 8.9–10.3)
Chloride: 104 mmol/L (ref 98–111)
Creatinine, Ser: 0.53 mg/dL (ref 0.44–1.00)
GFR, Estimated: >60 mL/min (ref >60)
Glucose, Bld: 89 mg/dL (ref 70–99)
Potassium: 3.3 mmol/L — ABNORMAL LOW (ref 3.5–5.1)
Sodium: 140 mmol/L (ref 135–145)
Total Bilirubin: 0.7 mg/dL (ref 0.3–1.2)
Total Protein: 4.7 g/dL — ABNORMAL LOW (ref 6.5–8.1)

## 2023-02-06 LAB — PHOSPHORUS: Phosphorus: 3.3 mg/dL (ref 2.5–4.6)

## 2023-02-06 LAB — SURGICAL PATHOLOGY

## 2023-02-06 LAB — AEROBIC/ANAEROBIC CULTURE W GRAM STAIN (SURGICAL/DEEP WOUND)

## 2023-02-06 MED ORDER — ADULT MULTIVITAMIN W/MINERALS CH
1.0000 | ORAL_TABLET | Freq: Every day | ORAL | Status: DC
  Administered 2023-02-06 – 2023-02-08 (×3): 1 via ORAL
  Filled 2023-02-06 (×3): qty 1

## 2023-02-06 MED ORDER — POTASSIUM CHLORIDE 20 MEQ PO PACK
60.0000 meq | PACK | Freq: Once | ORAL | Status: AC
  Administered 2023-02-06: 60 meq via ORAL
  Filled 2023-02-06: qty 3

## 2023-02-06 MED ORDER — ENSURE ENLIVE PO LIQD
237.0000 mL | Freq: Two times a day (BID) | ORAL | Status: DC
  Administered 2023-02-06 – 2023-02-08 (×4): 237 mL via ORAL

## 2023-02-06 NOTE — Progress Notes (Signed)
  Transition of Care [facility]) Screening Note   Patient Details  Name: Kayla Dean Date of Birth: [birth year]   Transition of Care [facility]) CM/SW Contact:    Villa Herb, LCSWA Phone Number: 02/06/2023, 4:25 PM    Transition of Care Department [facility]) has reviewed patient and no TOC needs have been identified at this time. We will continue to monitor patient advancement through interdisciplinary progression rounds. If new patient transition needs arise, please place a TOC consult.

## 2023-02-06 NOTE — Progress Notes (Signed)
Initial Nutrition Assessment  DOCUMENTATION CODES:      INTERVENTION:  Ensure Enlive po BID, prefers strawberry flavor  NUTRITION DIAGNOSIS:   Inadequate oral intake related to acute illness (abdominal pain PTA) as evidenced by per patient/family report.  GOAL:   Pt to meet >/= 90% of their estimated nutrition needs     MONITOR:  Diet advancement, Supplement acceptance, Weight trends  REASON FOR ASSESSMENT:   Malnutrition Screening Tool    ASSESSMENT: Patient is a 72 yo female from home complaining of severe upper abdominal pain, vomiting. PMH: gastric bypass, cholecystectomy. History of intentional weight loss.   4/19 exploratory laparotomy, reduction of incarcerated hernia/ hernia repair per chart.   Patient is awake and feeling better this morning. Tolerating full liquid diet. Her weight has been stable between 145-149 lb per patient. She takes a multivitamin and calcium daily. Bypass surgery at [facility] around 2017 per patient. Home diet is Regular. She still works and ambulates independently.   Medications reviewed.       Latest Ref Rng & Units 02/06/2023    4:44 AM 02/05/2023    5:19 AM 02/04/2023    3:27 AM  BMP  Glucose 70 - 99 mg/dL 89  98  161   BUN 8 - 23 mg/dL Creatinine 0.44 - 1.00 mg/dL 0.96  0.45  4.09   Sodium 135 - 145 mmol/L 140  141  139   Potassium 3.5 - 5.1 mmol/L 3.3  3.6  3.9   Chloride 98 - 111 mmol/L 104  106  106   CO2 22 - 32 mmol/L Calcium 8.9 - 10.3 mg/dL 7.6  8.0  7.9       NUTRITION - FOCUSED PHYSICAL EXAM:  NFPE conducted findings are mild fat depletion, mild muscle depletion and no edema.   Diet Order:   Diet Order             Diet full liquid Room service appropriate? Yes; Fluid consistency: Thin  Diet effective now                   EDUCATION NEEDS:  No education needs have been identified at this time  Skin:  Skin Assessment: Skin Integrity Issues: Skin Integrity Issues:: Incisions Incisions:  4/19 exp lap with hernia repair  Last BM:  4/22  Height:   Ht Readings from Last 1 Encounters:  02/02/23  (1.651 m)    Weight:   Wt Readings from Last 1 Encounters:  02/05/23 73.1 kg    Ideal Body Weight:   57 kg  BMI:  Body mass index is 26.82 kg/m.  Estimated Nutritional Needs:   Kcal:  1600-1800  Protein:  80-88 gr  Fluid:  >1500 ml daily  Royann Shivers MS,RD,CSG,LDN Contact: Loretha Stapler

## 2023-02-06 NOTE — Progress Notes (Signed)
[facility]  Having Bms. Dark black stool, no nausea. Some bloating.  BP 128/79 (BP Location: Right Arm)   Pulse 91   Temp 98 F (36.7 C)   Resp 16   Ht  (1.651 m)   Wt 73.1 kg   SpO2 95%   BMI 26.82 kg/m  Nondistended, appropriately tender, honeycomb removed, staples c/d/I without drainage or erythema  Patient  POD 2 s/p  EXPLORATORY LAPAROTOMY, [name], [name]    PRN For pain Is, OOB Dc cardiac monitor  Full liquids  Labs tomorrow, H&H drifting, old dark blood in stool from sloughing mucosa from the ischemia  Cefotetan for any translocation, ascites fluid negative to date, course completed  UOP good, Lr off LFTs resolved  SCDs, heparin sq  Ambulate  Kayla Greenhouse, MD [facility] [address] [place], [place] [address] [phone] (office)

## 2023-02-07 ENCOUNTER — Encounter (HOSPITAL_COMMUNITY): Payer: Self-pay | Admitting: General Surgery

## 2023-02-07 LAB — CBC
HCT: 28.3 % — ABNORMAL LOW (ref 36.0–46.0)
Hemoglobin: 9.2 g/dL — ABNORMAL LOW (ref 12.0–15.0)
MCH: 30.4 pg (ref 26.0–34.0)
MCHC: 32.5 g/dL (ref 30.0–36.0)
MCV: 93.4 fL (ref 80.0–100.0)
Platelets: 167 10*3/uL (ref 150–400)
RBC: 3.03 MIL/uL — ABNORMAL LOW (ref 3.87–5.11)
RDW: 13.5 % (ref 11.5–15.5)
WBC: 4.9 10*3/uL (ref 4.0–10.5)
nRBC: 0.4 % — ABNORMAL HIGH (ref 0.0–0.2)

## 2023-02-07 LAB — BASIC METABOLIC PANEL
Anion gap: 7 (ref 5–15)
BUN: <5 mg/dL — ABNORMAL LOW (ref 8–23)
CO2: 29 mmol/L (ref 22–32)
Calcium: 7.8 mg/dL — ABNORMAL LOW (ref 8.9–10.3)
Chloride: 102 mmol/L (ref 98–111)
Creatinine, Ser: 0.45 mg/dL (ref 0.44–1.00)
GFR, Estimated: >60 mL/min (ref >60)
Glucose, Bld: 103 mg/dL — ABNORMAL HIGH (ref 70–99)
Potassium: 3.3 mmol/L — ABNORMAL LOW (ref 3.5–5.1)
Sodium: 138 mmol/L (ref 135–145)

## 2023-02-07 LAB — AEROBIC/ANAEROBIC CULTURE W GRAM STAIN (SURGICAL/DEEP WOUND)

## 2023-02-07 MED ORDER — POTASSIUM CHLORIDE 20 MEQ PO PACK
60.0000 meq | PACK | Freq: Once | ORAL | Status: AC
  Administered 2023-02-07: 60 meq via ORAL
  Filled 2023-02-07: qty 3

## 2023-02-07 NOTE — Progress Notes (Signed)
Patient slept this shift after taking medications. No pain medications given. Patient ambulated to the restroom without request for assistance. Continued to monitor.

## 2023-02-07 NOTE — Progress Notes (Signed)
[facility] Progress Note  5 Days Post-Op  Subjective: Eating having more formed stools, still dark.   Objective: Vital signs in last 24 hours: Temp:  [98 F (36.7 C)-98.5 F (36.9 C)] 98.4 F (36.9 C) (04/24 0453) Pulse Rate:  [81-99] 83 (04/24 0453) Resp:  [16-18] 18 (04/24 0453) BP: (121-150)/(79-91) 121/87 (04/24 0453) SpO2:  [94 %-98 %] 96 % (04/24 0453) Last BM Date : 02/06/23  Intake/Output from previous day: 04/23 0701 - 04/24 0700 In: 600 [P.O.:600] Out: -  Intake/Output this shift: No intake/output data recorded.  General appearance: alert and no distress GI: soft, nondistended, mildly tender, staple line c/d/I with minor irritation, bruising, no erythema or drainage   Lab Results:  Recent Labs    02/06/23 0444 02/07/23 0435  WBC 6.4 4.9  HGB 8.5* 9.2*  HCT 27.0* 28.3*  PLT 131* 167   BMET Recent Labs    02/06/23 0444 02/07/23 0435  NA 140 138  K 3.3* 3.3*  CL 104 102  CO2 29 29  GLUCOSE 89 103*  BUN 8 <5*  CREATININE 0.53 0.45  CALCIUM 7.6* 7.8*   PT/INR No results for input(s): "LABPROT", "INR" in the last 72 hours.  Studies/Results: No results found.  Anti-infectives: Anti-infectives (From admission, onward)    Start     Dose/Rate Route Frequency Ordered Stop   02/03/23 1000  cefoTEtan (CEFOTAN) 2 g in sodium chloride 0.9 % 100 mL IVPB        2 g 200 mL/hr over 30 Minutes Intravenous Every 12 hours 02/02/23 2221 02/06/23 0808   02/03/23 0600  cefTRIAXone (ROCEPHIN) 2 g in sodium chloride 0.9 % 100 mL IVPB        2 g 200 mL/hr over 30 Minutes Intravenous On call to O.R. 02/02/23 1901 02/02/23 2026   02/02/23 1925  sodium chloride 0.9 % with cefTRIAXone (ROCEPHIN) ADS Med       Note to Pharmacy: Berneda Rose M: cabinet override      02/02/23 1925 02/03/23 0729       Assessment/Plan: Patient s/p  EXPLORATORY LAPAROTOMY, [name], [name] Improving  PRN for pain HD  improved Pureed/ soft diet Labs to make sure H&H stable still K replaced UOP good SCDs, heparin sq Ambulate  Home tomorrow     LOS: 5 days    Kayla Dean 02/07/2023

## 2023-02-08 LAB — CBC WITH DIFFERENTIAL/PLATELET
Abs Immature Granulocytes: 0.2 10*3/uL — ABNORMAL HIGH (ref 0.00–0.07)
Band Neutrophils: 15 %
Basophils Absolute: 0.1 10*3/uL (ref 0.0–0.1)
Basophils Relative: 1 %
Eosinophils Absolute: 0 10*3/uL (ref 0.0–0.5)
Eosinophils Relative: 0 %
HCT: 31.6 % — ABNORMAL LOW (ref 36.0–46.0)
Hemoglobin: 10.4 g/dL — ABNORMAL LOW (ref 12.0–15.0)
Lymphocytes Relative: 28 %
Lymphs Abs: 1.5 10*3/uL (ref 0.7–4.0)
MCH: 30.6 pg (ref 26.0–34.0)
MCHC: 32.9 g/dL (ref 30.0–36.0)
MCV: 92.9 fL (ref 80.0–100.0)
Metamyelocytes Relative: 4 %
Monocytes Absolute: 0.9 10*3/uL (ref 0.1–1.0)
Monocytes Relative: 17 %
Neutro Abs: 2.7 10*3/uL (ref 1.7–7.7)
Neutrophils Relative %: 35 %
Platelets: 196 10*3/uL (ref 150–400)
RBC: 3.4 MIL/uL — ABNORMAL LOW (ref 3.87–5.11)
RDW: 13.6 % (ref 11.5–15.5)
WBC: 5.4 10*3/uL (ref 4.0–10.5)
nRBC: 1.3 % — ABNORMAL HIGH (ref 0.0–0.2)

## 2023-02-08 LAB — BASIC METABOLIC PANEL
Anion gap: 8 (ref 5–15)
BUN: <5 mg/dL — ABNORMAL LOW (ref 8–23)
CO2: 27 mmol/L (ref 22–32)
Calcium: 7.9 mg/dL — ABNORMAL LOW (ref 8.9–10.3)
Chloride: 98 mmol/L (ref 98–111)
Creatinine, Ser: 0.53 mg/dL (ref 0.44–1.00)
GFR, Estimated: >60 mL/min (ref >60)
Glucose, Bld: 107 mg/dL — ABNORMAL HIGH (ref 70–99)
Potassium: 3.9 mmol/L (ref 3.5–5.1)
Sodium: 133 mmol/L — ABNORMAL LOW (ref 135–145)

## 2023-02-08 LAB — AEROBIC/ANAEROBIC CULTURE W GRAM STAIN (SURGICAL/DEEP WOUND)

## 2023-02-08 MED ORDER — OXYCODONE HCL 5 MG PO TABS: 5.0000 mg | ORAL_TABLET | ORAL | 0 refills | Status: DC | PRN

## 2023-02-08 MED ORDER — ONDANSETRON 4 MG PO TBDP: 4.0000 mg | ORAL_TABLET | Freq: Four times a day (QID) | ORAL | 0 refills | Status: DC | PRN

## 2023-02-08 NOTE — Progress Notes (Signed)
Patient slept through the night. Only received oxycodone once this shift. Continued to monitor.

## 2023-02-08 NOTE — Discharge Summary (Signed)
Physician Discharge Summary  Patient ID: Kayla Dean MRN: [id number] DOB/AGE: 11/07/1950 72 y.o.  Admit date: 02/02/2023 Discharge date: 02/08/2023  Admission Diagnoses: Ischemic bowel   Discharge Diagnoses:  Principal Problem:   Ischemic bowel disease Active Problems:   H/O gastric bypass   Incarcerated ventral hernia   Shock liver   Discharged Condition: Good  Hospital Course: Kayla Dean is a 72 yo who comes in with ischemic bowel on CT scan after having an incarcerated ventral hernia and loops of small bowel sandwiched between fascia and a mesh. She was taken to surgery and underwent reduction and primary repair of hte hernia. The ischemic bowel luckily was viable after this was reduced and no resection was needed. She had an ileus post operatively but this resolved with time. She was advanced on her diet and had good pain control. She did slough some mucosa with some dark black stools and drop in her H&H but this normalized.   Prior to discharge she was eating, having Bms and having good pain control.  She did feel a little achy prior to discharged. She had no sinus issues or cough. She had a low grade fever to 99 probably related to atelectasis. She says she felt that she needed to get home to be in her bed. Offered her to stay if she wanted but she felt like being the hospital bed was causing the aches. I gave her return precautions.  She will stay on a soft pureed diet and keep her stools soft for the next week. Warned her that there is some colds/ COVID/ flu that is still going around and to monitor.   Consults: None  Significant Diagnostic Studies:   Latest Reference Range & Units 02/08/23 04:54  Sodium 135 - 145 mmol/L 133 (L)  Potassium 3.5 - 5.1 mmol/L 3.9  Chloride 98 - 111 mmol/L 98  CO2 22 - 32 mmol/L 27  Glucose 70 - 99 mg/dL 409 (H)  BUN 8 - 23 mg/dL <5 (L)  Creatinine 8.11 - 1.00 mg/dL 9.14  Calcium 8.9 - 78.2 mg/dL 7.9 (L)  Anion gap 5 - 15  8  GFR,  Estimated >60 mL/min >60  (L): Data is abnormally low (H): Data is abnormally high   Latest Reference Range & Units 02/06/23 04:44 02/07/23 04:35 02/08/23 04:54  WBC 4.0 - 10.5 K/uL 6.4 4.9 5.4  RBC 3.87 - 5.11 MIL/uL 2.82 (L) 3.03 (L) 3.40 (L)  Hemoglobin 12.0 - 15.0 g/dL 8.5 (L) 9.2 (L) 95.6 (L)  HCT 36.0 - 46.0 % 27.0 (L) 28.3 (L) 31.6 (L)  MCV 80.0 - 100.0 fL 95.7 93.4 92.9  MCH 26.0 - 34.0 pg 30.1 30.4 30.6  MCHC 30.0 - 36.0 g/dL 21.3 08.6 57.8  RDW 46.9 - 15.5 % 13.5 13.5 13.6  Platelets 150 - 400 K/uL 131 (L) 167 196  nRBC 0.0 - 0.2 % 0.0 0.4 (H) 1.3 (H)  Neutrophils % 60  35  Lymphocytes % 23  28  Monocytes Relative % 13  17  Eosinophil % 2  0  Basophil % 1  1  (L): Data is abnormally low (H): Data is abnormally high  Ascites fluid sampled- no growth of bacteria to date  Treatments: IV hydration, antibiotics: cefotetan, and surgery: Ex lap, reduction of bowel, repair incarcerated hernia primarily 10cm defect   Discharge Exam: Blood pressure 114/75, pulse 100, temperature 99 F (37.2 C), resp. rate 18, height  (1.651 m), weight 73.1 kg, SpO2 94 %. General  appearance: alert and no distress Resp: normal work of breathing GI: soft, nondistended, staples c/d/I with no erythema or drainage, some bruising and local irritation, told patient to monitor   Disposition: Discharge disposition: 01-Home or Self Care       Discharge Instructions     Call MD for:  difficulty breathing, headache or visual disturbances   Complete by: As directed    Call MD for:  extreme fatigue   Complete by: As directed    Call MD for:  persistant dizziness or light-headedness   Complete by: As directed    Call MD for:  persistant nausea and vomiting   Complete by: As directed    Call MD for:  redness, tenderness, or signs of infection (pain, swelling, redness, odor or green/yellow discharge around incision site)   Complete by: As directed    Call MD for:  severe uncontrolled pain    Complete by: As directed    Call MD for:  temperature >100.4   Complete by: As directed    Increase activity slowly   Complete by: As directed       Allergies as of 02/08/2023   No Known Allergies      Medication List     TAKE these medications    acetaminophen 500 MG tablet Commonly known as: TYLENOL Take 1,000 mg by mouth every 8 (eight) hours as needed for moderate pain.   aspirin EC 81 MG tablet Take 1 tablet (81 mg total) by mouth daily. What changed:  when to take this additional instructions   Calcium 600 + D 600-200 MG-UNIT Tabs Generic drug: Calcium Carb-Cholecalciferol Take 2 tablets by mouth every morning.   ferrous sulfate 325 (65 FE) MG tablet Take 325 mg by mouth every morning.   fexofenadine 180 MG tablet Commonly known as: ALLEGRA Take 180 mg by mouth daily.   Flonase Allergy Relief 50 MCG/ACT nasal spray Generic drug: fluticasone Place 1 spray into both nostrils daily.   levothyroxine 137 MCG tablet Commonly known as: SYNTHROID Take 137 mcg by mouth daily before breakfast.   metoprolol tartrate 50 MG tablet Commonly known as: LOPRESSOR Take 1 tablet by mouth 2 hours prior to CT Scan   multivitamin with minerals Tabs tablet Take 1 tablet by mouth every morning.   naproxen sodium 220 MG tablet Commonly known as: ALEVE Take 220 mg by mouth daily as needed (pain).   ondansetron 4 MG disintegrating tablet Commonly known as: ZOFRAN-ODT Take 1 tablet (4 mg total) by mouth every 6 (six) hours as needed for nausea.   OVER THE COUNTER MEDICATION Take 1 tablet by mouth daily. Calcium   oxyCODONE 5 MG immediate release tablet Commonly known as: Oxy IR/ROXICODONE Take 1 tablet (5 mg total) by mouth every 4 (four) hours as needed for severe pain or breakthrough pain.   VITAMIN D-3 PO Take 1 capsule by mouth daily.        Follow-up Information     Lucretia Roers, MD Follow up on 02/15/2023.   Specialty: General Surgery Why: staple  removal Contact information: [address] [place] [facility] [address] [phone]                 Signed: Lucretia Roers 02/08/2023, 1:21 PM

## 2023-02-08 NOTE — Discharge Instructions (Signed)
Discharge Open Abdominal Surgery Instructions:  Continue soft/ pureed type diet for the next week. Keep stools regular and soft.   Common Complaints: Pain at the incision site is common. This will improve with time. Take your pain medications as described below. Some nausea is common and poor appetite. The main goal is to stay hydrated the first few days after surgery.   Diet/ Activity: Diet as tolerated. You have started and tolerated a diet in the hospital, and should continue to increase what you are able to eat.   You may not have a large appetite, but it is important to stay hydrated. Drink 64 ounces of water a day. Your appetite will return with time.  Keep a dry dressing in place over your staples daily or as needed. Some minor pink/ blood tinged drainage is expected. This will stop in a few days after surgery.  Shower per your regular routine daily.  Do not take hot showers. Take warm showers that are less than 10 minutes. Path the incision dry. Wear an abdominal binder daily with activity. You do not have to wear this while sleeping or sitting.  Rest and listen to your body, but do not remain in bed all day.  Walk everyday for at least 15-20 minutes. Deep cough and move around every 1-2 hours in the first few days after surgery.  Do not lift > 10 lbs, perform excessive bending, pushing, pulling, squatting for 6-8 weeks after surgery.  The activity restrictions and the abdominal binder are to prevent hernia formation at your incision while you are healing.  Do not place lotions or balms on your incision unless instructed to specifically by Dr. Henreitta Leber.   Pain Expectations and Narcotics: -After surgery you will have pain associated with your incisions and this is normal. The pain is muscular and nerve pain, and will get better with time. -You are encouraged and expected to take non narcotic medications like tylenol and ibuprofen (when able) to treat pain as multiple modalities can aid  with pain treatment. -Narcotics are only used when pain is severe or there is breakthrough pain. -You are not expected to have a pain score of 0 after surgery, as we cannot prevent pain. A pain score of 3-4 that allows you to be functional, move, walk, and tolerate some activity is the goal. The pain will continue to improve over the days after surgery and is dependent on your surgery. -Due to [place] law, we are only able to give a certain amount of pain medication to treat post operative pain, and we only give additional narcotics on a patient by patient basis.  -For most laparoscopic surgery, studies have shown that the majority of patients only need 10-15 narcotic pills, and for open surgeries most patients only need 15-20.   -Having appropriate expectations of pain and knowledge of pain management with non narcotics is important as we do not want anyone to become addicted to narcotic pain medication.  -Using ice packs in the first 48 hours and heating pads after 48 hours, wearing an abdominal binder (when recommended), and using over the counter medications are all ways to help with pain management.   -Simple acts like meditation and mindfulness practices after surgery can also help with pain control and research has proven the benefit of these practices.  Medication: Take tylenol and ibuprofen as needed for pain control, alternating every 4-6 hours.  Example:  Tylenol  @ 6am, 12noon, 6pm, (Do not exceed  of tylenol a  day). Ibuprofen  @ 9am, 3pm, 9pm, 3am (Do not exceed  of ibuprofen a day).  Take Roxicodone for breakthrough pain every 4 hours.  Take Colace for constipation related to narcotic pain medication. If you do not have a bowel movement in 2 days, take Miralax over the counter.  Drink plenty of water to also prevent constipation.   Contact Information: If you have questions or concerns, please call our office, [phone], Monday- Thursday 8AM-5PM and  Friday 8AM-12Noon.  If it is after hours or on the weekend, please call [facility], [phone], [phone], and ask to speak to the surgeon on call for Dr. Henreitta Leber at [facility].

## 2023-02-15 ENCOUNTER — Ambulatory Visit (INDEPENDENT_AMBULATORY_CARE_PROVIDER_SITE_OTHER): Payer: Medicare Other | Admitting: General Surgery

## 2023-02-15 ENCOUNTER — Encounter: Payer: Self-pay | Admitting: General Surgery

## 2023-02-15 ENCOUNTER — Other Ambulatory Visit: Payer: Self-pay

## 2023-02-15 VITALS — BP 96/59 | HR 63 | Temp 98.2°F | Resp 18 | Ht 65.0 in | Wt 146.0 lb

## 2023-02-15 DIAGNOSIS — K436 Other and unspecified ventral hernia with obstruction, without gangrene: Secondary | ICD-10-CM

## 2023-02-15 DIAGNOSIS — R52 Pain, unspecified: Secondary | ICD-10-CM

## 2023-02-15 LAB — COMPREHENSIVE METABOLIC PANEL
AG Ratio: 1.2 (calc) (ref 1.0–2.5)
ALT: 14 U/L (ref 6–29)
AST: 14 U/L (ref 10–35)
Albumin: 3.2 g/dL — ABNORMAL LOW (ref 3.6–5.1)
Alkaline phosphatase (APISO): 65 U/L (ref 37–153)
BUN/Creatinine Ratio: 11 (calc) (ref 6–22)
BUN: 6 mg/dL — ABNORMAL LOW (ref 7–25)
CO2: 30 mmol/L (ref 20–32)
Calcium: 8.2 mg/dL — ABNORMAL LOW (ref 8.6–10.4)
Chloride: 100 mmol/L (ref 98–110)
Creat: 0.56 mg/dL — ABNORMAL LOW (ref 0.60–1.00)
Globulin: 2.7 g/dL (calc) (ref 1.9–3.7)
Glucose, Bld: 84 mg/dL (ref 65–99)
Potassium: 3.4 mmol/L — ABNORMAL LOW (ref 3.5–5.3)
Sodium: 139 mmol/L (ref 135–146)
Total Bilirubin: 0.4 mg/dL (ref 0.2–1.2)
Total Protein: 5.9 g/dL — ABNORMAL LOW (ref 6.1–8.1)

## 2023-02-15 LAB — CBC WITH DIFFERENTIAL/PLATELET
Absolute Monocytes: 869 cells/uL (ref 200–950)
Basophils Absolute: 51 cells/uL (ref 0–200)
Basophils Relative: 0.5 %
Eosinophils Absolute: 111 cells/uL (ref 15–500)
Eosinophils Relative: 1.1 %
HCT: 33.8 % — ABNORMAL LOW (ref 35.0–45.0)
Hemoglobin: 11.1 g/dL — ABNORMAL LOW (ref 11.7–15.5)
Lymphs Abs: 2242 cells/uL (ref 850–3900)
MCH: 29.8 pg (ref 27.0–33.0)
MCHC: 32.8 g/dL (ref 32.0–36.0)
MCV: 90.6 fL (ref 80.0–100.0)
MPV: 9.1 fL (ref 7.5–12.5)
Monocytes Relative: 8.6 %
Neutro Abs: 6828 cells/uL (ref 1500–7800)
Neutrophils Relative %: 67.6 %
Platelets: 365 10*3/uL (ref 140–400)
RBC: 3.73 10*6/uL — ABNORMAL LOW (ref 3.80–5.10)
RDW: 12.8 % (ref 11.0–15.0)
Total Lymphocyte: 22.2 %
WBC: 10.1 10*3/uL (ref 3.8–10.8)

## 2023-02-15 LAB — MAGNESIUM: Magnesium: 2 mg/dL (ref 1.5–2.5)

## 2023-02-15 MED ORDER — ZOLPIDEM TARTRATE 5 MG PO TABS: 5.0000 mg | ORAL_TABLET | Freq: Every evening | ORAL | 0 refills | Status: AC | PRN

## 2023-02-15 NOTE — Patient Instructions (Signed)
Diet as tolerated. Lab work to check due to your weakness/ aches. Keep stools regular and soft. Take miralax if the bowel movement is not coming; No heavy lifting > 10 lbs, excessive bending, pushing, pulling, or squatting for 6-8 weeks after surgery.   Will call with lab results.

## 2023-02-15 NOTE — Progress Notes (Signed)
[facility]  Not having a great appetite and still feels really achy over her body. Her abdomen is not hurting and she is only taking the pain medicine at night to try to get some sleep. She is using tylenol and ibuprofen otherwise.   She is having Bms but is not regular yet. She is trying stool softener.   BP (!) 96/59   Pulse 63   Temp 98.2 F (36.8 C) (Oral)   Resp 18   Ht 5\' 5"  (1.651 m)   Wt 146 lb (66.2 kg)   SpO2 94%   BMI 24.30 kg/m  Incision c/d/I with staples, no erythema or drainage, minor bruising, evolving, staple removed and steri strips placed Legs with some minor edema at ankles and feet bilateral   Patient s/p Ex lap, reduction of incarcerated hernia and primary repair. Bowel was ischemic on entry and returned to viable once reduced.   Diet as tolerated. Lab work to check due to your weakness/ aches. Keep stools regular and soft. Take miralax if the bowel movement is not coming; No heavy lifting > 10 lbs, excessive bending, pushing, pulling, or squatting for 6-8 weeks after surgery.   Will call with lab results.   Future Appointments  Date Time Provider Department Center  03/06/2023 11:45 AM Lucretia Roers, MD [facility] None   Algis Greenhouse, MD [facility] [address] [place], [place] [address] [phone] (office)

## 2023-02-16 ENCOUNTER — Telehealth: Payer: Self-pay | Admitting: Family Medicine

## 2023-02-16 NOTE — Telephone Encounter (Signed)
FMLA paperwork completed and faxed to [employer]. At HR for [facility] [employer] at [phone]. Confirmation received.   Patient out of work starting 02/02/2023 and may return unrestricted 03/19/2023.  In hospital from 02/02/2023 - 02/06/2023

## 2023-02-16 NOTE — Progress Notes (Signed)
Let patient know that her WBC is normal and her H&H is returning to normal. Everything overall is looking good. Kidney function looks good, and Magnesium looks good. Her potassium is a little down but nothing crazy. She can advance her diet and maybe add in some potassium rich foods. I think her swelling will get better and the aches. Tell her to let us know if things are not improving over the next few weeks before I see her again.

## 2023-02-22 ENCOUNTER — Telehealth: Payer: Self-pay | Admitting: *Deleted

## 2023-02-22 NOTE — Telephone Encounter (Signed)
Call placed to patient and patient made aware.   Appointment scheduled for 02/27/2023. Advised if pain becomes severe to go to ER for evaluation.   Verbalized understanding.

## 2023-02-22 NOTE — Telephone Encounter (Signed)
Surgical Date: 02/02/2023 Procedure: EXPLORATORY LAPAROTOMY, [name], EXPLANT OF ABDOMINAL MESH   Received call from patient [phone]~ telephone  Patient reports that she is having more sharp pain in right side of abdomen. Reports that generalized body aches have improved. Also reports that BLE edema continues regardless of how long she props up her feet.   Patient reports that she is having small BM's approximately every other day that range from normal to hard. States that she is taking colace daily and Miralax as directed. States that she is drinking plenty of water.   Please advise.

## 2023-02-22 NOTE — Telephone Encounter (Signed)
I think for the leg swelling she may need to see her PCP to make sure nothing else is going on with her heart or fluid retention. The lab work we had did was reassuring.  As far as the right lower abdomen pain, we should see her and check on her to see if we need to order any CT scan. It sounds like she is doing everything right and having Bms.

## 2023-02-27 ENCOUNTER — Encounter: Payer: Self-pay | Admitting: General Surgery

## 2023-02-27 ENCOUNTER — Ambulatory Visit (INDEPENDENT_AMBULATORY_CARE_PROVIDER_SITE_OTHER): Payer: Medicare Other | Admitting: General Surgery

## 2023-02-27 ENCOUNTER — Ambulatory Visit (HOSPITAL_COMMUNITY): Admission: RE | Admit: 2023-02-27 | Payer: Medicare Other | Source: Ambulatory Visit

## 2023-02-27 VITALS — BP 102/71 | HR 97 | Temp 98.9°F | Resp 14 | Ht 65.0 in | Wt 148.0 lb

## 2023-02-27 DIAGNOSIS — R1084 Generalized abdominal pain: Secondary | ICD-10-CM

## 2023-02-27 DIAGNOSIS — R52 Pain, unspecified: Secondary | ICD-10-CM

## 2023-02-27 DIAGNOSIS — R109 Unspecified abdominal pain: Secondary | ICD-10-CM | POA: Insufficient documentation

## 2023-02-27 MED ORDER — FUROSEMIDE 20 MG PO TABS: 20.0000 mg | ORAL_TABLET | ORAL | 0 refills | Status: DC

## 2023-02-27 NOTE — Progress Notes (Signed)
[facility] Note   HPI:  72 y.o. Female presents to clinic for post-op follow-up evaluation after Ex lap, reduction of incarcerated hernia and finding of ischemic bowel that turned viable. Mesh explanted. She is having irregular Bms going from diarrhea to small balls. She is eating but feels full and she is having right sided pain.   Review of Systems:  Abdominal pain Irregular bms  Minor feet swelling esp worse in evening  All other review of systems: otherwise negative   Vital Signs:  BP 102/71   Pulse 97   Temp 98.9 F (37.2 C) (Oral)   Resp 14   Ht 5\' 5"  (1.651 m)   Wt 148 lb (67.1 kg)   SpO2 93%   BMI 24.63 kg/m    Physical Exam:  Physical Exam HENT:     Head: Normocephalic.     Mouth/Throat:     Mouth: Mucous membranes are moist.  Cardiovascular:     Rate and Rhythm: Normal rate.  Pulmonary:     Effort: Pulmonary effort is normal.  Abdominal:     General: There is no distension.     Palpations: Abdomen is soft.     Tenderness: There is abdominal tenderness.     Comments: Mild tenderness with deep palpation, midline healed, no erythema or drainage   Musculoskeletal:        General: Swelling present.     Comments: Feet with minor pitting edema just below the ankles   Skin:    General: Skin is warm.  Neurological:     General: No focal deficit present.     Mental Status: She is alert.     Assessment:  72 y.o. yo Female with concern for abdominal pain after Ex lap for incarcerated hernia and finding ischemic bowel that with reduction was viable. Multiple anastomosis in complicated setting of prior bypass. Discussed that her intestines could be inflamed or edematous or that they could be strictured if one area did not heal appropriately after the ischemia. We will not know until we do a scan.   Plan:  Go to [facility] now for CT. You will have to drink the oral contrast and you will wait around 2 hours before the scan is done.   Will do lasix 20 mg.  Try a tablet of lasix and see if this helps the feet swelling. If the swelling is better then, do not take for 3 days and then if needed try it again. If continuing to need any, will need to readdress with PCP.    Will call with CT results.   Algis Greenhouse, MD [facility] [address] [place], [place] [address] [phone] (office)

## 2023-02-27 NOTE — Patient Instructions (Addendum)
Go to [facility] now for CT. You will have to drink the oral contrast and you will wait around 2 hours before the scan is done.   Will do lasix 20 mg. Try a tablet of lasix and see if this helps the feet swelling. If the swelling is better then, do not take for 3 days and then if needed try it again. If continuing to need any, will need to readdress with PCP.    Will call with CT results.

## 2023-02-28 ENCOUNTER — Ambulatory Visit (HOSPITAL_COMMUNITY)
Admission: RE | Admit: 2023-02-28 | Discharge: 2023-02-28 | Disposition: A | Discharge status: Home / Self Care | Payer: Medicare Other | Source: Ambulatory Visit | Attending: General Surgery | Admitting: General Surgery

## 2023-02-28 DIAGNOSIS — R1084 Generalized abdominal pain: Secondary | ICD-10-CM | POA: Insufficient documentation

## 2023-02-28 DIAGNOSIS — R109 Unspecified abdominal pain: Secondary | ICD-10-CM | POA: Diagnosis present

## 2023-02-28 MED ORDER — IOHEXOL 300 MG/ML  SOLN
100.0000 mL | Freq: Once | INTRAMUSCULAR | Status: AC | PRN
  Administered 2023-02-28: 100 mL via INTRAVENOUS

## 2023-02-28 NOTE — Progress Notes (Signed)
Reviewed imaging, small bowel with thickening /edema. This corresponds with the area that had been incarcerated and ischemic but was viable after reduction. The CT shows thickening of the wall and this is likely reactive. There is not a stricture at this time. The mesentery is edematous. We are 4 weeks out. I do not think we need to do any surgery at this time, but I do think we need to do a low residue diet to allow for easy digestion and let the bowels rest more. This is hopefully all improve with time and will plan for repeat imaging in about 4-6 weeks. I have discussed with my partners who agree.  Will see her in 2 weeks to follow up 5/30 and cancel the 5/21 appt. I have called the patient and told her to follow the low residue diet. She is in agreement. Discussed that if this did not improve or that if it strictured we will likely let her [facility] surgeons know that did her bypass as this may be a more complicated resection with her bypass history. Example of Low Residue Diet- [email or web address]   She will call if she cannot find a resource online for low residue diet and will look on her mychart to see this.

## 2023-03-06 ENCOUNTER — Encounter: Payer: Medicare Other | Admitting: General Surgery

## 2023-03-09 ENCOUNTER — Emergency Department (HOSPITAL_COMMUNITY): Payer: Medicare Other

## 2023-03-09 ENCOUNTER — Ambulatory Visit
Admission: RE | Admit: 2023-03-09 | Discharge: 2023-03-09 | Disposition: A | Discharge status: Home / Self Care | Payer: Medicare Other | Source: Ambulatory Visit | Attending: Emergency Medicine | Admitting: Emergency Medicine

## 2023-03-09 ENCOUNTER — Emergency Department (HOSPITAL_COMMUNITY)
Admission: EM | Admit: 2023-03-09 | Discharge: 2023-03-09 | Disposition: A | Discharge status: Home / Self Care | Payer: Medicare Other | Attending: Emergency Medicine | Admitting: Emergency Medicine

## 2023-03-09 ENCOUNTER — Encounter (HOSPITAL_COMMUNITY): Payer: Self-pay | Admitting: *Deleted

## 2023-03-09 ENCOUNTER — Other Ambulatory Visit: Payer: Self-pay

## 2023-03-09 VITALS — BP 94/66 | HR 75 | Temp 97.8°F | Resp 20

## 2023-03-09 DIAGNOSIS — R1010 Upper abdominal pain, unspecified: Secondary | ICD-10-CM | POA: Diagnosis not present

## 2023-03-09 DIAGNOSIS — K6389 Other specified diseases of intestine: Secondary | ICD-10-CM | POA: Diagnosis not present

## 2023-03-09 DIAGNOSIS — K5900 Constipation, unspecified: Secondary | ICD-10-CM | POA: Insufficient documentation

## 2023-03-09 DIAGNOSIS — R600 Localized edema: Secondary | ICD-10-CM | POA: Insufficient documentation

## 2023-03-09 DIAGNOSIS — K639 Disease of intestine, unspecified: Secondary | ICD-10-CM

## 2023-03-09 HISTORY — DX: Aortic aneurysm of unspecified site, without rupture: I71.9

## 2023-03-09 LAB — CBC WITH DIFFERENTIAL/PLATELET
Abs Immature Granulocytes: 0.09 10*3/uL — ABNORMAL HIGH (ref 0.00–0.07)
Basophils Absolute: 0.1 10*3/uL (ref 0.0–0.1)
Basophils Relative: 1 %
Eosinophils Absolute: 0 10*3/uL (ref 0.0–0.5)
Eosinophils Relative: 0 %
HCT: 35.8 % — ABNORMAL LOW (ref 36.0–46.0)
Hemoglobin: 11.3 g/dL — ABNORMAL LOW (ref 12.0–15.0)
Immature Granulocytes: 1 %
Lymphocytes Relative: 17 %
Lymphs Abs: 2.2 10*3/uL (ref 0.7–4.0)
MCH: 29.7 pg (ref 26.0–34.0)
MCHC: 31.6 g/dL (ref 30.0–36.0)
MCV: 94.2 fL (ref 80.0–100.0)
Monocytes Absolute: 0.8 10*3/uL (ref 0.1–1.0)
Monocytes Relative: 6 %
Neutro Abs: 10.1 10*3/uL — ABNORMAL HIGH (ref 1.7–7.7)
Neutrophils Relative %: 75 %
Platelets: 250 10*3/uL (ref 150–400)
RBC: 3.8 MIL/uL — ABNORMAL LOW (ref 3.87–5.11)
RDW: 15.9 % — ABNORMAL HIGH (ref 11.5–15.5)
WBC: 13.2 10*3/uL — ABNORMAL HIGH (ref 4.0–10.5)
nRBC: 0 % (ref 0.0–0.2)

## 2023-03-09 LAB — COMPREHENSIVE METABOLIC PANEL
ALT: 16 U/L (ref 0–44)
AST: 16 U/L (ref 15–41)
Albumin: 1.9 g/dL — ABNORMAL LOW (ref 3.5–5.0)
Alkaline Phosphatase: 101 U/L (ref 38–126)
Anion gap: 9 (ref 5–15)
BUN: 18 mg/dL (ref 8–23)
CO2: 26 mmol/L (ref 22–32)
Calcium: 7.4 mg/dL — ABNORMAL LOW (ref 8.9–10.3)
Chloride: 100 mmol/L (ref 98–111)
Creatinine, Ser: 0.56 mg/dL (ref 0.44–1.00)
GFR, Estimated: >60 mL/min (ref >60)
Glucose, Bld: 97 mg/dL (ref 70–99)
Potassium: 4 mmol/L (ref 3.5–5.1)
Sodium: 135 mmol/L (ref 135–145)
Total Bilirubin: 0.6 mg/dL (ref 0.3–1.2)
Total Protein: 5.1 g/dL — ABNORMAL LOW (ref 6.5–8.1)

## 2023-03-09 LAB — LIPASE, BLOOD: Lipase: 17 U/L (ref 11–51)

## 2023-03-09 LAB — LACTIC ACID, PLASMA: Lactic Acid, Venous: 0.8 mmol/L (ref 0.5–1.9)

## 2023-03-09 MED ORDER — IOHEXOL 300 MG/ML  SOLN
100.0000 mL | Freq: Once | INTRAMUSCULAR | Status: AC | PRN
  Administered 2023-03-09: 100 mL via INTRAVENOUS

## 2023-03-09 MED ORDER — FLEET ENEMA 7-19 GM/118ML RE ENEM
1.0000 | ENEMA | Freq: Once | RECTAL | Status: AC
  Administered 2023-03-09: 1 via RECTAL

## 2023-03-09 MED ORDER — IOHEXOL 9 MG/ML PO SOLN
ORAL | Status: AC
  Filled 2023-03-09: qty 1000

## 2023-03-09 NOTE — ED Notes (Signed)
Patient is being discharged from the Urgent Care and sent to the Emergency Department via POV. Per Cathlean Marseilles NP, patient is in need of higher level of care due to Constipation and swelling in lower extremities. Patient is aware and verbalizes understanding of plan of care.  Vitals:   03/09/23 0905  BP: 94/66  Pulse: 75  Resp: 20  Temp: 97.8 F (36.6 C)  SpO2: 99%

## 2023-03-09 NOTE — ED Triage Notes (Signed)
Pt c/o constipation x 1.5 weeks. Pt reports she had an intestinal surgery back in April due to an ischemic bowel. Pt also reports bilateral leg swelling since the surgery. Pt reports she told her PCP about the swelling right after surgery, but they thought it was from the surgery. Pt then spoke to her surgeon about the swelling and per family, they "reluctantly gave her a few fluid pills" which helped very little and was told to follow up with PCP. Pt then went to follow up with her PCP and they are now no longer working. Pt is currently in the process of trying to find another PCP. Pt went to UC this morning and was sent to ED for further evaluation. Pt also c/o abdominal pain whenever she eats. Pt reports she has to take a pain pill before she eats anything to prevent the pain.

## 2023-03-09 NOTE — Discharge Instructions (Signed)
Please go directly to the ER for further evaluation and management of your symptoms

## 2023-03-09 NOTE — Discharge Instructions (Addendum)
You are seen in the emergency department for constipation, intermittent abdominal pain, peripheral edema.  Your lab work was fairly unremarkable, normal renal function.  Your CAT scan showed some persistent bowel wall thickening and some constipation.  You were given an enema.  You can continue MiraLAX.  Continue low residue diet.  Follow-up with Dr. Henreitta Leber Thursday.  Return if any high fevers or worsening pain.

## 2023-03-09 NOTE — Progress Notes (Signed)
[facility]  CT read reviewed and talked to Dr. Charm Barges. Labs reassuring with normal lactic, etc; [place] negative for DVT. Main complaints were the constipation and leg swelling. Still having the pain in right abdomen with food and bowel remains thickened, some small area intussusception probably from peristalsis and edema, not obstructed.  She is suppose to be on low residue diet as I think her bowel where it was in carcerated and strangled is trying to recover but may ultimately stricture given all this edema that remains. Planned for repeat imaging in a few weeks.  Right now nothing acutely surgical and exam reassuring. Recommend constipation management; follow up with me Thursday; return precautions but over all things seem stable from 5/14.  Unable to look at images myself currently. She may end up and need surgery but do think with  her anatomy (see operative note) better served with gastric bypass specialist (hers was at [facility].) Will have to see if need to refer her as this progresses.  Algis Greenhouse, MD

## 2023-03-09 NOTE — ED Triage Notes (Signed)
Pt c/o constipation pt sates she has had constipation x 1 1/2, she has taken miralax 3 days  ducolax 1 day colace 1 day and suppository laxative. And only produced a small amount. Pt states she had a surgery on her colon back in April of this year.

## 2023-03-09 NOTE — ED Notes (Signed)
Patient is being discharged from the Urgent Care and sent to the Emergency Department via POV . Per NP, patient is in need of higher level of care due to abdominal pain. Patient is aware and verbalizes understanding of plan of care.  Vitals:   03/09/23 0905  BP: 94/66  Pulse: 75  Resp: 20  Temp: 97.8 F (36.6 C)  SpO2: 99%

## 2023-03-09 NOTE — ED Provider Notes (Signed)
[facility]    CSN: [id number] Arrival date & time: 03/09/23  0856      History   Chief Complaint Chief Complaint  Patient presents with   Constipation    Swelling of ankle and feet - Entered by patient    HPI Kayla Dean is a 72 y.o. female.   Patient presents today with 1.5 weeks of constipation.  Has taken 3 days of MiraLAX, Dulcolax for 1 day, and Colace for 1 day as well as a suppository laxative without improvement or relief.  Reports that she is taking Motrin, Tylenol, or oxycodone pain medication for the abdominal pain.  Reports history of recent incarcerated hernia last month, status post laparotomy and hernia repair.  Also endorses nausea and vomiting over the past few days that occurs "out of nowhere."  Appetite has been decreased as well.  No fevers, body aches or chills.  Patient also endorses lower extremity swelling that she has never had before.  No chest pain or shortness of breath.  Patient is also status post gastric bypass, small bowel obstruction, and cholecystectomy, abdominal hysterectomy.    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      Home Medications    Prior to Admission medications   Medication Sig Start Date End Date Taking? Authorizing Provider  acetaminophen (TYLENOL) 500 MG tablet Take 1,000 mg by mouth every 8 (eight) hours as needed for moderate pain.    [provider]  Calcium Carb-Cholecalciferol (CALCIUM 600 + D) 600-200 MG-UNIT TABS Take 2 tablets by mouth every morning.    [provider]  Cholecalciferol (VITAMIN D-3 PO) Take 1 capsule by mouth daily.    [provider]  ferrous sulfate 325 (65 FE) MG tablet Take 325 mg by mouth every morning.    [provider]  fluticasone (FLONASE ALLERGY RELIEF) 50 MCG/ACT nasal spray Place 1 spray into both nostrils daily.    [provider]  furosemide (LASIX) 20 MG tablet Take 1 tablet (20 mg total) by mouth as directed. Try a tablet of lasix and see if this helps the feet swelling. If the swelling is better then, do not take for 3 days and then if needed try it again. 02/27/23   Lucretia Roers, MD  levothyroxine (SYNTHROID, LEVOTHROID) 137 MCG tablet Take 137 mcg by mouth daily before breakfast.    [provider]  Multiple Vitamin (MULTIVITAMIN WITH MINERALS) TABS tablet Take 1 tablet by mouth every morning.    [provider]  naproxen sodium (ALEVE) 220 MG tablet Take 220 mg by mouth daily as needed (pain).    [provider]  ondansetron (ZOFRAN-ODT) 4 MG disintegrating tablet Take 1 tablet (4 mg total) by mouth every 6 (six) hours as needed for nausea. 02/08/23   Lucretia Roers, MD  oxyCODONE (OXY IR/ROXICODONE) 5 MG immediate release tablet Take 1 tablet (5 mg total) by mouth every 4 (four) hours as needed for severe pain or breakthrough pain. 02/08/23   Lucretia Roers, MD  zolpidem (AMBIEN) 5 MG tablet Take 1 tablet (5 mg total) by mouth at bedtime as needed for sleep. 02/15/23   Lucretia Roers, MD    Family  History Family History  Problem Relation Age of Onset   CAD Father    CAD Brother     Social History Social History   Tobacco Use   Smoking status: Never   Smokeless tobacco: Never  Substance Use Topics   Alcohol use: Never   Drug use: Never     Allergies   Patient has no known allergies.   Review of Systems Review of Systems Per HPI  Physical Exam Triage Vital Signs ED Triage Vitals  Enc Vitals Group     BP 03/09/23 0905 94/66     Pulse Rate 03/09/23 0905 75     Resp 03/09/23 0905 20     Temp 03/09/23 0905 97.8 F (36.6 C)     Temp Source 03/09/23 0905 Oral     SpO2 03/09/23 0905 99 %     Weight --      Height --      Head Circumference --      Peak Flow --      Pain Score 03/09/23 0909 8     Pain Loc --      Pain Edu? --      Excl. in GC? --    No data found.  Updated Vital Signs BP 94/66 (BP Location: Right Arm)   Pulse 75   Temp 97.8 F (36.6 C) (Oral)   Resp 20   SpO2 99%   Visual Acuity Right Eye Distance:   Left Eye Distance:   Bilateral Distance:    Right Eye Near:   Left Eye Near:    Bilateral Near:     Physical Exam Vitals and nursing note reviewed.  Constitutional:      General: She is not in acute distress.    Appearance: Normal appearance. She is not toxic-appearing.  HENT:     Head: Normocephalic and atraumatic.     Mouth/Throat:     Mouth: Mucous membranes are moist.     Pharynx: Oropharynx is clear.  Eyes:     General: No scleral icterus.    Extraocular Movements: Extraocular movements intact.  Cardiovascular:     Rate and Rhythm: Normal rate and regular rhythm.  Pulmonary:     Effort: Pulmonary effort is normal. No respiratory distress.     Breath sounds: Normal breath sounds. No wheezing, rhonchi or rales.  Abdominal:     General: Abdomen is flat. Bowel sounds are normal. There is no distension.     Palpations: Abdomen is soft.     Tenderness: There is abdominal tenderness in the right upper quadrant and left  upper quadrant. There is guarding.  Musculoskeletal:     Cervical back: Normal range of motion.  Lymphadenopathy:     Cervical: No cervical adenopathy.  Skin:    General: Skin  is warm and dry.     Capillary Refill: Capillary refill takes less than 2 seconds.     Coloration: Skin is not jaundiced or pale.     Findings: No erythema.  Neurological:     Mental Status: She is alert and oriented to person, place, and time.  Psychiatric:        Behavior: Behavior is cooperative.      UC Treatments / Results  Labs (all labs ordered are listed, but only abnormal results are displayed) Labs Reviewed - No data to display  EKG   Radiology No results found.  Procedures Procedures (including critical care time)  Medications Ordered in UC Medications - No data to display  Initial Impression / Assessment and Plan / UC Course  I have reviewed the triage vital signs and the nursing notes.  Pertinent labs & imaging results that were available during my care of the patient were reviewed by me and considered in my medical decision making (see chart for details).   Patient is well-appearing, afebrile, not tachycardic, not tachypneic, oxygenating well on room air.  Patient is slightly hypotensive in triage today.  1. Pain of upper abdomen 2. Bilateral lower extremity edema Given given recent surgery, constipation, guarding on examination, I recommended further evaluation and management emergency room.  Patient is safe to transport via private vehicle at this time.  Patient is in agreement to plan.  The patient was given the opportunity to ask questions.  All questions answered to their satisfaction.  The patient is in agreement to this plan.    Final Clinical Impressions(s) / UC Diagnoses   Final diagnoses:  Pain of upper abdomen  Bilateral lower extremity edema     Discharge Instructions      Please go directly to the ER for further evaluation and management of your  symptoms    ED Prescriptions   None    PDMP not reviewed this encounter.   Valentino Nose, NP 03/09/23 [phone]

## 2023-03-09 NOTE — ED Provider Notes (Signed)
[facility] AT [facility] Provider Note   CSN: [id number] Arrival date & time: 03/09/23  1308     History  Chief Complaint  Patient presents with   Constipation   Leg Swelling    Kayla Dean is a 72 y.o. female.  She had an exploratory laparotomy reduction of incarcerated hernia and ischemic bowel surgery done few weeks ago by Dr. Henreitta Leber.  She has been back in the office twice since then with abdominal pain and leg swelling.  Prescribed diuretic.  Continues with leg swelling and also constipation.  Using over-the-counter medication without any improvement.  She is infrequently using narcotics and usually just using ibuprofen and Tylenol for pain with eating.  Continues to endorse right-sided abdominal pain with eating.  Few episodes of vomiting nonbloody.  Is having a little rectal bleeding which she attributes to constipation.  No fevers or chills  The history is provided by the patient and a relative.  Abdominal Pain Pain location:  RUQ and RLQ Pain quality: aching   Pain radiates to:  Does not radiate Pain severity:  Moderate Onset quality:  Gradual Duration:  2 weeks Timing:  Intermittent Progression:  Unchanged Context: eating   Relieved by:  Nothing Worsened by:  Eating Ineffective treatments:  Acetaminophen and NSAIDs Associated symptoms: constipation, nausea and vomiting   Associated symptoms: no chest pain, no cough, no diarrhea, no dysuria, no fever, no hematemesis, no hematuria and no shortness of breath        Home Medications Prior to Admission medications   Medication Sig Start Date End Date Taking? Authorizing Provider  acetaminophen (TYLENOL) 500 MG tablet Take 1,000 mg by mouth every 8 (eight) hours as needed for moderate pain.    [provider]  Calcium Carb-Cholecalciferol (CALCIUM 600 + D) 600-200 MG-UNIT TABS Take 2 tablets by mouth every morning.    [provider]  Cholecalciferol (VITAMIN D-3 PO)  Take 1 capsule by mouth daily.    [provider]  ferrous sulfate 325 (65 FE) MG tablet Take 325 mg by mouth every morning.    [provider]  fluticasone (FLONASE ALLERGY RELIEF) 50 MCG/ACT nasal spray Place 1 spray into both nostrils daily.    [provider]  furosemide (LASIX) 20 MG tablet Take 1 tablet (20 mg total) by mouth as directed. Try a tablet of lasix and see if this helps the feet swelling. If the swelling is better then, do not take for 3 days and then if needed try it again. 02/27/23   Lucretia Roers, MD  levothyroxine (SYNTHROID, LEVOTHROID) 137 MCG tablet Take 137 mcg by mouth daily before breakfast.    [provider]  Multiple Vitamin (MULTIVITAMIN WITH MINERALS) TABS tablet Take 1 tablet by mouth every morning.    [provider]  naproxen sodium (ALEVE) 220 MG tablet Take 220 mg by mouth daily as needed (pain).    [provider]  ondansetron (ZOFRAN-ODT) 4 MG disintegrating tablet Take 1 tablet (4 mg total) by mouth every 6 (six) hours as needed for nausea. 02/08/23   Lucretia Roers, MD  oxyCODONE (OXY IR/ROXICODONE) 5 MG immediate release tablet Take 1 tablet (5 mg total) by mouth every 4 (four) hours as needed for severe pain or breakthrough pain. 02/08/23   Lucretia Roers, MD  zolpidem (AMBIEN) 5 MG tablet Take 1 tablet (5 mg total) by mouth at bedtime as needed for sleep. 02/15/23   Lucretia Roers, MD  Allergies    Patient has no known allergies.    Review of Systems   Review of Systems  Constitutional:  Negative for fever.  Respiratory:  Negative for cough and shortness of breath.   Cardiovascular:  Negative for chest pain.  Gastrointestinal:  Positive for abdominal pain, constipation, nausea and vomiting. Negative for diarrhea and hematemesis.  Genitourinary:  Negative for dysuria and hematuria.    Physical Exam Updated Vital Signs BP 106/73 (BP Location: Right Arm)   Pulse 81   Temp 98.7  F (37.1 C) (Oral)   Resp 18   Ht 5\' 5"  (1.651 m)   Wt 67.1 kg   SpO2 97%   BMI 24.63 kg/m  Physical Exam Vitals and nursing note reviewed.  Constitutional:      General: She is not in acute distress.    Appearance: Normal appearance. She is well-developed.  HENT:     Head: Normocephalic and atraumatic.  Eyes:     Conjunctiva/sclera: Conjunctivae normal.  Cardiovascular:     Rate and Rhythm: Normal rate and regular rhythm.     Heart sounds: No murmur heard. Pulmonary:     Effort: Pulmonary effort is normal. No respiratory distress.     Breath sounds: Normal breath sounds.  Abdominal:     Palpations: Abdomen is soft.     Tenderness: There is no abdominal tenderness. There is no guarding or rebound.     Comments: sHe has a well-healed midline incision without surrounding erythema  Genitourinary:    Comments: Rectal exam done with tech as chaperone.  Some old anal tags.  Soft brown stool in vault.  Partially evacuated.  No gross bleeding.  No masses appreciated Musculoskeletal:        General: No tenderness. Normal range of motion.     Cervical back: Neck supple.     Right lower leg: Edema present.     Left lower leg: Edema present.     Comments: She has 1+ pitting edema lower extremities without any appreciable cords.  No erythema.  Skin:    General: Skin is warm and dry.     Capillary Refill: Capillary refill takes less than 2 seconds.  Neurological:     General: No focal deficit present.     Mental Status: She is alert.     Sensory: No sensory deficit.     Motor: No weakness.     ED Results / Procedures / Treatments   Labs (all labs ordered are listed, but only abnormal results are displayed) Labs Reviewed  COMPREHENSIVE METABOLIC PANEL - Abnormal; Notable for the following components:      Result Value   Calcium 7.4 (*)    Total Protein 5.1 (*)    Albumin 1.9 (*)    All other components within normal limits  CBC WITH DIFFERENTIAL/PLATELET - Abnormal; Notable  for the following components:   WBC 13.2 (*)    RBC 3.80 (*)    Hemoglobin 11.3 (*)    HCT 35.8 (*)    RDW 15.9 (*)    Neutro Abs 10.1 (*)    Abs Immature Granulocytes 0.09 (*)    All other components within normal limits  LIPASE, BLOOD  LACTIC ACID, PLASMA    EKG None  Radiology CT ABDOMEN PELVIS W CONTRAST  Result Date: 03/09/2023 CLINICAL DATA:  Right-sided postprandial abdominal pain. Constipation for 1.5 weeks. 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 EXAM: CT ABDOMEN AND PELVIS WITH CONTRAST TECHNIQUE: Multidetector CT imaging of the abdomen  and pelvis was performed using the standard protocol following bolus administration of intravenous contrast. RADIATION DOSE REDUCTION: This exam was performed according to the departmental dose-optimization program which includes automated exposure control, adjustment of the mA and/or kV according to patient size and/or use of iterative reconstruction technique. CONTRAST:  OMNIPAQUE IOHEXOL 300 MG/ML  SOLN COMPARISON:  02/28/2023 FINDINGS: Lower chest: Contrast medium in the distal esophagus suggesting reflux, dysmotility, or poor transit. Scarring or atelectasis along both hemidiaphragms. Left anterior descending coronary artery atherosclerosis. Small type 1 hiatal hernia containing most of the gastric pouch. Hepatobiliary: Mild intrahepatic biliary dilatation. Common bile duct 1.1 cm in diameter, increased from previous measurement of 0.7 cm. No directly visualized choledocholithiasis. Pancreas: Unremarkable Spleen: Unremarkable Adrenals/Urinary Tract: Unremarkable Stomach/Bowel: Gastric bypass with patent gastrojejunostomy. Starting about 28 cm distal to the gastrojejunostomy, and starting about at the level of the anastomotic staple line in the jejunum, there is substantial wall thickening extending along several loops of small bowel with extensive edema in the associated small bowel mesentery, and ill-defined bowel walls. Difficult to  assess mucosal enhancement given the oral contrast. No pneumatosis or obvious extraluminal gas or fluid. Edema in the adjacent small bowel mesentery with scattered small bowel lymph nodes measuring up to about 9 mm in diameter in this region. Hyperemia in the associated small bowel mesentery. Wall thickening extends to the next anastomosis in the left abdomen shown about on image 45 of series 5, with there is a short segment of less thickened loop with what appears to be an intussusception from an adjacent loop extending into this region for example on images 43 through 50 of series 2. I do not observe this to be necessarily obstructive. Subsequent loops of small bowel do not appear thickened and orally administered contrast extends to extend beyond the intussuscipiens. Prominent stool throughout the colon favors constipation. Prominent stool ball in the colon. Cannot exclude fecal impaction. Vascular/Lymphatic: Mild aortoiliac atherosclerotic vascular disease. No findings of high-grade stenosis or occlusion of the celiac trunk or SMA. Reproductive: Uterus absent.  Adnexa unremarkable. Other: Reduced size of the postoperative fluid collection in the subcutaneous tissues anterior to the linea alba, currently measuring about 3.2 by 0.9 cm in cross-section, formerly 4.8 by 1.8 cm. Musculoskeletal: Right total hip prosthesis. Bony demineralization. Levoconvex lumbar scoliosis with rotary component. IMPRESSION: 1. Prominent wall thickening, indistinct bowel wall, and mesenteric edema and hyperemia starting about 28 cm distal to the gastrojejunostomy and extending to the level of the anastomotic staple line in the distal jejunum. Given the history of recent ischemic bowel, I cannot exclude ischemia involving the segments of bowel although there is no pneumatosis or portal venous gas in the appearance in most respects is similar to 02/28/2023. One notable new finding is a small intussusception near the distal margin of  this bowel wall thickening, without obstruction involving the intussuscipiens and without an obvious contour change along the small bowel intussusceptum to suggest obstruction. 2. Prominent stool throughout the colon favors constipation. Prominent stool ball in the colon. Cannot exclude fecal impaction. 3. Small type 1 hiatal hernia containing most of the gastric pouch. Contrast medium in the distal esophagus suggesting reflux, dysmotility, or poor transit. 4. Mild intrahepatic and extrahepatic biliary dilatation, increased from previous measurement of 0.7 cm. No directly visualized choledocholithiasis. Correlate with bilirubin levels in determining whether further workup is warranted. 5. Reduced size of the postoperative fluid collection in the subcutaneous tissues anterior to the linea alba, currently measuring about 3.2 by 0.9 cm  in cross-section, formerly 4.8 by 1.8 cm. This is likely an improving postoperative hematoma. 6. Aortic atherosclerosis. Aortic Atherosclerosis (ICD10-I70.0). Electronically Signed   By: Gaylyn Rong M.D.   On: 03/09/2023 14:56   US Venous Img Lower Bilateral  Result Date: 03/09/2023 CLINICAL DATA:  72 year old female with leg swelling EXAM: BILATERAL LOWER EXTREMITY VENOUS DOPPLER ULTRASOUND TECHNIQUE: Gray-scale sonography with graded compression, as well as color Doppler and duplex ultrasound were performed to evaluate the lower extremity deep venous systems from the level of the common femoral vein and including the common femoral, femoral, profunda femoral, popliteal and calf veins including the posterior tibial, peroneal and gastrocnemius veins when visible. The superficial great saphenous vein was also interrogated. Spectral Doppler was utilized to evaluate flow at rest and with distal augmentation maneuvers in the common femoral, femoral and popliteal veins. COMPARISON:  None Available. FINDINGS: RIGHT LOWER EXTREMITY Common Femoral Vein: No evidence of thrombus. Normal  compressibility, respiratory phasicity and response to augmentation. Saphenofemoral Junction: No evidence of thrombus. Normal compressibility and flow on color Doppler imaging. Profunda Femoral Vein: No evidence of thrombus. Normal compressibility and flow on color Doppler imaging. Femoral Vein: No evidence of thrombus. Normal compressibility, respiratory phasicity and response to augmentation. Popliteal Vein: No evidence of thrombus. Normal compressibility, respiratory phasicity and response to augmentation. Calf Veins: No evidence of thrombus. Normal compressibility and flow on color Doppler imaging. Superficial Great Saphenous Vein: No evidence of thrombus. Normal compressibility and flow on color Doppler imaging. Other Findings:  Edema LEFT LOWER EXTREMITY Common Femoral Vein: No evidence of thrombus. Normal compressibility, respiratory phasicity and response to augmentation. Saphenofemoral Junction: No evidence of thrombus. Normal compressibility and flow on color Doppler imaging. Profunda Femoral Vein: No evidence of thrombus. Normal compressibility and flow on color Doppler imaging. Femoral Vein: No evidence of thrombus. Normal compressibility, respiratory phasicity and response to augmentation. Popliteal Vein: No evidence of thrombus. Normal compressibility, respiratory phasicity and response to augmentation. Calf Veins: No evidence of thrombus. Normal compressibility and flow on color Doppler imaging. Superficial Great Saphenous Vein: No evidence of thrombus. Normal compressibility and flow on color Doppler imaging. Other Findings:  Edema IMPRESSION: Directed duplex of the bilateral lower extremities negative for DVT Edema the bilateral lower extremity superficial soft tissues Signed, Yvone Neu. Miachel Roux, RPVI [facility] Rad[facility] Electronically Signed   By: Gilmer Mor D.O.   On: 03/09/2023 11:48    Procedures Fecal disimpaction  Date/Time:  03/09/2023 6:02 PM  Performed by: Terrilee Files, MD Authorized by: Terrilee Files, MD  Consent: Verbal consent obtained. Consent given by: patient Patient understanding: patient states understanding of the procedure being performed Patient identity confirmed: verbally with patient Local anesthesia used: no  Anesthesia: Local anesthesia used: no  Sedation: Patient sedated: no  Patient tolerance: patient tolerated the procedure well with no immediate complications       Medications Ordered in ED Medications  iohexol (OMNIPAQUE) 300 MG/ML solution 100 mL (100 mLs Intravenous Contrast Given 03/09/23 1401)  sodium phosphate (FLEET) 7-19 GM/118ML enema 1 enema (1 enema Rectal Given 03/09/23 1541)    ED Course/ Medical Decision Making/ A&P Clinical Course as of 03/09/23 1800  Fri Mar 09, 2023  1336 Reviewed case with Gen surgery Dr. Henreitta Leber.  She did not feel any angio was necessary but did recommend a CT with oral contrast. [MB]  1511 Reviewed case and imaging with Dr. Henreitta Leber.  She said to consider enema.  She does not feel  that the patient is operative at this time and can go home and follow-up with her as scheduled on Thursday.  Continue low residue diet. [MB]    Clinical Course User Index [MB] Terrilee Files, MD                             Medical Decision Making Amount and/or Complexity of Data Reviewed Labs: ordered. Radiology: ordered.  Risk OTC drugs. Prescription drug management.   This patient complains of constipation, pedal edema, abdominal pain with eating; this involves an extensive number of treatment Options and is a complaint that carries with it a high risk of complications and morbidity. The differential includes obstruction, perforation, postoperative pain, constipation, fecal impaction, renal failure, metabolic derangement  I ordered, reviewed and interpreted labs, which included CBC with mildly elevated white count, hemoglobin low stable from  priors, chemistries with stable renal function low calcium, LFTs normal, lactate normal I ordered medication fleets enema and reviewed PMP when indicated. I ordered imaging studies which included CT abdomen and pelvis, bilateral venous duplex and I independently    visualized and interpreted imaging which showed no DVT, does have some bowel thickening and small segment of possible intussusception, postoperative changes, signs of constipation Additional history obtained from patient's daughter Previous records obtained and reviewed in epic including recent discharge summary I consulted general surgery Dr. Henreitta Leber and discussed lab and imaging findings and discussed disposition.  Cardiac monitoring reviewed, normal sinus rhythm Social determinants considered, no no significant barriers Critical Interventions: None  After the interventions stated above, I reevaluated the patient and found patient had a soft abdomen and no distress Admission and further testing considered, reviewed general surgery recommendations with patient and she is comfortable plan for outpatient follow-up with them.  Will continue low residual diet.  Given an enema here in the department and signed out to Dr. Vonita Moss in anticipation for likely discharge.         Final Clinical Impression(s) / ED Diagnoses Final diagnoses:  Constipation, unspecified constipation type  Bowel wall thickening  Peripheral edema    Rx / DC Orders ED Discharge Orders     None         Terrilee Files, MD 03/09/23 [phone]

## 2023-03-13 ENCOUNTER — Telehealth: Payer: Self-pay | Admitting: Cardiology

## 2023-03-13 DIAGNOSIS — Z79899 Other long term (current) drug therapy: Secondary | ICD-10-CM

## 2023-03-13 NOTE — Telephone Encounter (Signed)
Pt c/o swelling: STAT is pt has developed SOB within 24 hours  If swelling, where is the swelling located? Legs & feet  How much weight have you gained and in what time span? Unsure   Have you gained 3 pounds in a day or 5 pounds in a week? Not positive   Do you have a log of your daily weights (if so, list)? No   Are you currently taking a fluid pill? No   Are you currently SOB? No   Have you traveled recently? No    Recently had surgery on 04/19. PCP originally thought swelling was from fluids given for that, but she has now been advised to f/u with Cardiology regarding it.

## 2023-03-13 NOTE — Telephone Encounter (Signed)
   Covering for Dr. Diona Browner - I would be concerned about as DVT but it appears she already had lower extremity dopplers on 03/09/2023 which showed no evidence of a DVT. If still having significant swelling, would recommend taking Lasix 40mg  daily for 5-7 days and calling back with results and weight afterwards. Obtain a BNP and BMET in 6-7 days. Would use compression stockings daily and also limit sodium intake.    Signed, Ellsworth Lennox, PA-C 03/13/2023, 7:41 PM

## 2023-03-13 NOTE — Telephone Encounter (Signed)
I spoke with patient.She states since her hernia surgery in April, she has had feet swelling. She denies SOB, weight gain- has actually lost weight due to low residue diet she was placed on.She was given 5 -20 mg Lasix tablets by Dr.Bridges and told to take QOD.She has noticed no improvement in feet swelling.She states they are swollen even upon arising and worsens during the day.She says her abdomen is quite bloated all the time. At the advice of family,she has called [place].

## 2023-03-14 MED ORDER — FUROSEMIDE 40 MG PO TABS: ORAL_TABLET | ORAL | 0 refills | Status: DC

## 2023-03-14 NOTE — Telephone Encounter (Signed)
I spoke with patient and she will take Lasix 40 mg daily for 7 days and then call us back with weight and update on symptoms. She will also get a BMET,BNP at [facility] in 7 days.  I encouraged her to wear compression stockings and eat a 2 GM sodium diet. She agrees with plan.

## 2023-03-15 ENCOUNTER — Encounter: Payer: Self-pay | Admitting: General Surgery

## 2023-03-15 ENCOUNTER — Ambulatory Visit (INDEPENDENT_AMBULATORY_CARE_PROVIDER_SITE_OTHER): Payer: Medicare Other | Admitting: General Surgery

## 2023-03-15 VITALS — BP 101/69 | HR 71 | Temp 98.0°F | Resp 14 | Ht 65.0 in | Wt 140.0 lb

## 2023-03-15 DIAGNOSIS — K436 Other and unspecified ventral hernia with obstruction, without gangrene: Secondary | ICD-10-CM

## 2023-03-15 DIAGNOSIS — R109 Unspecified abdominal pain: Secondary | ICD-10-CM

## 2023-03-15 MED ORDER — OXYCODONE HCL 5 MG PO TABS: 5.0000 mg | ORAL_TABLET | ORAL | 0 refills | Status: DC | PRN

## 2023-03-15 NOTE — Patient Instructions (Addendum)
Keep stool regular, try colace twice daily only.  Take roxicodone if needed for pain. Continue on low residue diet for now. Lets plan to see you 6/19 and will decide about timing for repeat CT. May end up and have to refer you back to a surgeon that does revisions of gastric bypasses if this does not improve or we are worried about strictured/ narrowed area.   Call or go to the ED if worsening pain, nausea/vomiting that does not resolve, or severe constipation.

## 2023-03-15 NOTE — Progress Notes (Signed)
[facility]  Went to the ED last Friday with BLE leg swelling and  continue Right sided abdominal pain with some bloating. She was worked up with doppler that was negative for DVT. She was placed on lasix by cardiology who is planning to follow up with labs per the telephone notes.   Her pain continues at times. She is on a low residue diet. CT done 5/24 looks essentially similar to 5/15 as there is thickening of the small bowel wall and mesenteric edema.   She was disimpacted in ED and has been having Bms now. She is on colace and miralax.  BP 101/69   Pulse 71   Temp 98 F (36.7 C) (Oral)   Resp 14   Ht 5\' 5"  (1.651 m)   Wt 140 lb (63.5 kg)   SpO2 94%   BMI 23.30 kg/m  Soft, nondistended, tender midline with thin skin midline and can feel sutures, some seroma noted, no signs of erythema or drainage, tender right abdomen  Reviewed CT and no obvious source of specific pain in the right side, I did explant mesh and bring the midline together but also she has this thickened edematous bowel. Contrast moves through the bowel and continued mesenteric edema  Patient with thickened bowel wall and edema in the mesentery after having incarcerated hernia and ischemic bowel that became viable. No obvious points of obstruction yet and no obvious places of stricture, just edema.   I have talked to them and discussed that with the low residue diet this could get better or may not and may stricture. Discussed that she will need a gastric bypass surgeon to do any resection as she may need rerouting or revision of the GJ.  I hope this is not the case but will keep this conversation going.   Keep stool regular, try colace twice daily only.  Take roxicodone if needed for pain. Continue on low residue diet for now. Lets plan to see you 6/27 and will decide about timing for repeat CT. May end up and have to refer you back to a surgeon that does revisions of gastric bypasses if this does  not improve or we are worried about strictured/ narrowed area.   Call or go to the ED if worsening pain, nausea/vomiting that does not resolve, or severe constipation.  Will stay out of work until seeing me 6/27 Avoid ibuprofen given your pouch to be safe.  Will likely stay in the [facility] network if she is needing revision surgery, and discussed Dr. Sheliah Hatch may be an option if it comes to that and I can always reach out to him about this if we need.    Future Appointments  Date Time Provider Department Center  04/12/2023  2:45 PM Lucretia Roers, MD [facility] None   Algis Greenhouse, MD [facility] [address] [place], [place] [address] [phone] (office)

## 2023-03-21 ENCOUNTER — Other Ambulatory Visit (HOSPITAL_COMMUNITY)
Admission: RE | Admit: 2023-03-21 | Discharge: 2023-03-21 | Disposition: A | Discharge status: Home / Self Care | Payer: Medicare Other | Source: Ambulatory Visit | Attending: Student | Admitting: Student

## 2023-03-21 ENCOUNTER — Telehealth: Payer: Self-pay

## 2023-03-21 DIAGNOSIS — Z79899 Other long term (current) drug therapy: Secondary | ICD-10-CM | POA: Insufficient documentation

## 2023-03-21 DIAGNOSIS — R600 Localized edema: Secondary | ICD-10-CM | POA: Insufficient documentation

## 2023-03-21 LAB — BASIC METABOLIC PANEL
Anion gap: 8 (ref 5–15)
BUN: 16 mg/dL (ref 8–23)
CO2: 29 mmol/L (ref 22–32)
Calcium: 7.6 mg/dL — ABNORMAL LOW (ref 8.9–10.3)
Chloride: 96 mmol/L — ABNORMAL LOW (ref 98–111)
Creatinine, Ser: 0.57 mg/dL (ref 0.44–1.00)
GFR, Estimated: >60 mL/min (ref >60)
Glucose, Bld: 111 mg/dL — ABNORMAL HIGH (ref 70–99)
Potassium: 3.4 mmol/L — ABNORMAL LOW (ref 3.5–5.1)
Sodium: 133 mmol/L — ABNORMAL LOW (ref 135–145)

## 2023-03-21 LAB — BRAIN NATRIURETIC PEPTIDE: B Natriuretic Peptide: 70 pg/mL (ref 0.0–100.0)

## 2023-03-21 NOTE — Telephone Encounter (Signed)
Ms.Fuguy reports her legs are now as "small as my arms" and her wight is 134 lbs (6 lbs weight loss) after taking Lasix 40 mg for 7 days.  She does c/o no energy, no strength.  She has not had her BNP or BMET done yet, plans to do today at [facility].  I told he we need to see what her labs result to make a plan forward.   I will FYI B.Iran Ouch, PA-C

## 2023-03-21 NOTE — Telephone Encounter (Signed)
Patient returned RN's call. 

## 2023-03-21 NOTE — Telephone Encounter (Signed)
-----   Message from Ellsworth Lennox, [place] sent at 03/21/2023 12:53 PM EDT ----- Please let the patient know that her fluid level is normal. Kidney function remains stable.  Potassium is slightly low at 3.4 and given that she has stopped Lasix, this should normalize.  Increase intake of potassium rich foods (bananas, greens, tomatoes, etc). Can repeat BMET with [place] or PCP in 2-3 weeks.

## 2023-03-21 NOTE — Telephone Encounter (Signed)
Results discussed with patient and she will repeat BMET at [facility] in 2-3 weeks. We talked again about elevating legs while sitting and the use of compression stockings.

## 2023-03-21 NOTE — Telephone Encounter (Signed)
Rx for Lasix was only for 7 days

## 2023-04-04 ENCOUNTER — Ambulatory Visit: Payer: Medicare Other | Admitting: General Surgery

## 2023-04-09 ENCOUNTER — Telehealth: Payer: Self-pay | Admitting: Cardiology

## 2023-04-09 DIAGNOSIS — M7989 Other specified soft tissue disorders: Secondary | ICD-10-CM

## 2023-04-09 DIAGNOSIS — R0609 Other forms of dyspnea: Secondary | ICD-10-CM

## 2023-04-09 NOTE — Telephone Encounter (Signed)
Spoke to pt who stated her leg swelling has been getting worse since her surgery on April 19th. Pt stated that she now has fluid oozing out of her legs. Pt stated that her current weight is 137 lb, but she is on a low residue diet and believes this is offsetting the weight from the fluid. Pt stated that she is wearing compression stockings, but they had slid down and she is unable to pull htem back up. Pt stated she was given lasix to take for 7 days by B. Strader, PA-C and then was stopped. Pt stated she has no energy and could barely walk last week during her beach trip.  Denies SOB/CP  Please advise

## 2023-04-09 NOTE — Telephone Encounter (Signed)
Pt c/o swelling: STAT is pt has developed SOB within 24 hours  How much weight have you gained and in what time span? States she has probably gained weight from the swelling, but feels she has lost weight from the diet she is on.  If swelling, where is the swelling located? From knees down to ankles. Top of her feet are an half an inch tall.   Are you currently taking a fluid pill? Was stopped  Are you currently SOB? Only with activity, but not at rest  Do you have a log of your daily weights (if so, list)? 137 LBS this am.  Have you gained 3 pounds in a day or 5 pounds in a week?   Have you traveled recently? Just to beach and then home last week. States it was all she could do to get to the first level.   Pt states fluid has been oozing, and dripping down her leg. States she had looked down and seen a puddle beside her foot.

## 2023-04-10 ENCOUNTER — Other Ambulatory Visit (HOSPITAL_COMMUNITY)
Admission: RE | Admit: 2023-04-10 | Discharge: 2023-04-10 | Disposition: A | Discharge status: Home / Self Care | Payer: Medicare Other | Source: Ambulatory Visit | Attending: Cardiology | Admitting: Cardiology

## 2023-04-10 DIAGNOSIS — R0609 Other forms of dyspnea: Secondary | ICD-10-CM | POA: Insufficient documentation

## 2023-04-10 DIAGNOSIS — R600 Localized edema: Secondary | ICD-10-CM | POA: Diagnosis not present

## 2023-04-10 DIAGNOSIS — I959 Hypotension, unspecified: Secondary | ICD-10-CM | POA: Diagnosis not present

## 2023-04-10 DIAGNOSIS — M7989 Other specified soft tissue disorders: Secondary | ICD-10-CM | POA: Insufficient documentation

## 2023-04-10 LAB — BASIC METABOLIC PANEL
Anion gap: 6 (ref 5–15)
BUN: 14 mg/dL (ref 8–23)
CO2: 28 mmol/L (ref 22–32)
Calcium: 7.1 mg/dL — ABNORMAL LOW (ref 8.9–10.3)
Chloride: 100 mmol/L (ref 98–111)
Creatinine, Ser: 0.68 mg/dL (ref 0.44–1.00)
GFR, Estimated: >60 mL/min (ref >60)
Glucose, Bld: 95 mg/dL (ref 70–99)
Potassium: 3.5 mmol/L (ref 3.5–5.1)
Sodium: 134 mmol/L — ABNORMAL LOW (ref 135–145)

## 2023-04-10 MED ORDER — POTASSIUM CHLORIDE CRYS ER 20 MEQ PO TBCR: 20.0000 meq | EXTENDED_RELEASE_TABLET | Freq: Every day | ORAL | 3 refills | Status: DC

## 2023-04-10 MED ORDER — FUROSEMIDE 80 MG PO TABS: 80.0000 mg | ORAL_TABLET | Freq: Every day | ORAL | 3 refills | Status: DC

## 2023-04-10 NOTE — Addendum Note (Signed)
Addended by: Roseanne Reno on: 04/10/2023 08:11 AM   Modules accepted: Orders

## 2023-04-10 NOTE — Telephone Encounter (Signed)
Patient notified and verbalized understanding. Patient had no further questions or concerns at this time. Pt scheduled w/ E. Philis Nettle, NP on 7/26.   Will route message to scheduling team to schedule Echo for patient.

## 2023-04-11 ENCOUNTER — Telehealth: Payer: Self-pay

## 2023-04-11 DIAGNOSIS — R0609 Other forms of dyspnea: Secondary | ICD-10-CM

## 2023-04-11 NOTE — Telephone Encounter (Signed)
-----   Message from [name], MD sent at 04/10/2023  9:06 PM EDT ----- Baseline labs with recent adjustment in Lasix and potassium supplement. Should have follow-up BMET for pending office visit.

## 2023-04-11 NOTE — Telephone Encounter (Signed)
Patient notified via My Chart

## 2023-04-12 ENCOUNTER — Ambulatory Visit (INDEPENDENT_AMBULATORY_CARE_PROVIDER_SITE_OTHER): Payer: Medicare Other | Admitting: General Surgery

## 2023-04-12 ENCOUNTER — Encounter: Payer: Self-pay | Admitting: General Surgery

## 2023-04-12 ENCOUNTER — Other Ambulatory Visit: Payer: Self-pay

## 2023-04-12 ENCOUNTER — Inpatient Hospital Stay (HOSPITAL_COMMUNITY)
Admission: EM | Admit: 2023-04-12 | Discharge: 2023-04-26 | DRG: 314 | Disposition: A | Discharge status: Home Health | Payer: Medicare Other | Attending: Internal Medicine | Admitting: Internal Medicine

## 2023-04-12 ENCOUNTER — Emergency Department (HOSPITAL_COMMUNITY): Payer: Medicare Other

## 2023-04-12 ENCOUNTER — Encounter (HOSPITAL_COMMUNITY): Payer: Self-pay | Admitting: Emergency Medicine

## 2023-04-12 VITALS — BP 80/40 | HR 105 | Temp 97.8°F | Resp 14 | Ht 65.0 in | Wt 134.0 lb

## 2023-04-12 DIAGNOSIS — E039 Hypothyroidism, unspecified: Secondary | ICD-10-CM | POA: Diagnosis present

## 2023-04-12 DIAGNOSIS — I2510 Atherosclerotic heart disease of native coronary artery without angina pectoris: Secondary | ICD-10-CM | POA: Diagnosis present

## 2023-04-12 DIAGNOSIS — Z7901 Long term (current) use of anticoagulants: Secondary | ICD-10-CM

## 2023-04-12 DIAGNOSIS — D638 Anemia in other chronic diseases classified elsewhere: Secondary | ICD-10-CM | POA: Diagnosis present

## 2023-04-12 DIAGNOSIS — Z96641 Presence of right artificial hip joint: Secondary | ICD-10-CM | POA: Diagnosis present

## 2023-04-12 DIAGNOSIS — I959 Hypotension, unspecified: Secondary | ICD-10-CM | POA: Diagnosis present

## 2023-04-12 DIAGNOSIS — Z79890 Hormone replacement therapy (postmenopausal): Secondary | ICD-10-CM

## 2023-04-12 DIAGNOSIS — I351 Nonrheumatic aortic (valve) insufficiency: Secondary | ICD-10-CM | POA: Diagnosis present

## 2023-04-12 DIAGNOSIS — J9811 Atelectasis: Secondary | ICD-10-CM | POA: Diagnosis present

## 2023-04-12 DIAGNOSIS — E861 Hypovolemia: Secondary | ICD-10-CM | POA: Diagnosis not present

## 2023-04-12 DIAGNOSIS — Z8249 Family history of ischemic heart disease and other diseases of the circulatory system: Secondary | ICD-10-CM | POA: Diagnosis not present

## 2023-04-12 DIAGNOSIS — I2699 Other pulmonary embolism without acute cor pulmonale: Secondary | ICD-10-CM | POA: Diagnosis present

## 2023-04-12 DIAGNOSIS — D72819 Decreased white blood cell count, unspecified: Secondary | ICD-10-CM | POA: Diagnosis not present

## 2023-04-12 DIAGNOSIS — I951 Orthostatic hypotension: Secondary | ICD-10-CM | POA: Diagnosis not present

## 2023-04-12 DIAGNOSIS — I950 Idiopathic hypotension: Secondary | ICD-10-CM | POA: Diagnosis not present

## 2023-04-12 DIAGNOSIS — M7989 Other specified soft tissue disorders: Secondary | ICD-10-CM | POA: Diagnosis not present

## 2023-04-12 DIAGNOSIS — K769 Liver disease, unspecified: Secondary | ICD-10-CM | POA: Diagnosis present

## 2023-04-12 DIAGNOSIS — E440 Moderate protein-calorie malnutrition: Secondary | ICD-10-CM | POA: Diagnosis not present

## 2023-04-12 DIAGNOSIS — I2693 Single subsegmental pulmonary embolism without acute cor pulmonale: Secondary | ICD-10-CM | POA: Diagnosis present

## 2023-04-12 DIAGNOSIS — I5032 Chronic diastolic (congestive) heart failure: Secondary | ICD-10-CM | POA: Diagnosis present

## 2023-04-12 DIAGNOSIS — Z96651 Presence of right artificial knee joint: Secondary | ICD-10-CM | POA: Diagnosis present

## 2023-04-12 DIAGNOSIS — R600 Localized edema: Principal | ICD-10-CM | POA: Diagnosis present

## 2023-04-12 DIAGNOSIS — Z79899 Other long term (current) drug therapy: Secondary | ICD-10-CM | POA: Diagnosis not present

## 2023-04-12 DIAGNOSIS — E869 Volume depletion, unspecified: Secondary | ICD-10-CM | POA: Diagnosis present

## 2023-04-12 DIAGNOSIS — I2690 Septic pulmonary embolism without acute cor pulmonale: Secondary | ICD-10-CM | POA: Diagnosis not present

## 2023-04-12 DIAGNOSIS — Z6823 Body mass index (BMI) 23.0-23.9, adult: Secondary | ICD-10-CM

## 2023-04-12 DIAGNOSIS — E2749 Other adrenocortical insufficiency: Secondary | ICD-10-CM | POA: Diagnosis present

## 2023-04-12 DIAGNOSIS — I82451 Acute embolism and thrombosis of right peroneal vein: Secondary | ICD-10-CM

## 2023-04-12 DIAGNOSIS — K551 Chronic vascular disorders of intestine: Secondary | ICD-10-CM | POA: Diagnosis present

## 2023-04-12 DIAGNOSIS — E871 Hypo-osmolality and hyponatremia: Secondary | ICD-10-CM | POA: Diagnosis present

## 2023-04-12 DIAGNOSIS — E46 Unspecified protein-calorie malnutrition: Secondary | ICD-10-CM | POA: Diagnosis not present

## 2023-04-12 DIAGNOSIS — Z9049 Acquired absence of other specified parts of digestive tract: Secondary | ICD-10-CM

## 2023-04-12 DIAGNOSIS — I77810 Thoracic aortic ectasia: Secondary | ICD-10-CM | POA: Diagnosis not present

## 2023-04-12 DIAGNOSIS — R7989 Other specified abnormal findings of blood chemistry: Secondary | ICD-10-CM | POA: Diagnosis present

## 2023-04-12 DIAGNOSIS — R54 Age-related physical debility: Secondary | ICD-10-CM | POA: Diagnosis present

## 2023-04-12 DIAGNOSIS — E8809 Other disorders of plasma-protein metabolism, not elsewhere classified: Secondary | ICD-10-CM | POA: Diagnosis present

## 2023-04-12 DIAGNOSIS — Z90710 Acquired absence of both cervix and uterus: Secondary | ICD-10-CM

## 2023-04-12 DIAGNOSIS — I5031 Acute diastolic (congestive) heart failure: Secondary | ICD-10-CM | POA: Diagnosis not present

## 2023-04-12 DIAGNOSIS — R109 Unspecified abdominal pain: Secondary | ICD-10-CM

## 2023-04-12 DIAGNOSIS — Z9884 Bariatric surgery status: Secondary | ICD-10-CM

## 2023-04-12 LAB — BASIC METABOLIC PANEL
Anion gap: 9 (ref 5–15)
BUN: 14 mg/dL (ref 8–23)
CO2: 29 mmol/L (ref 22–32)
Calcium: 7.7 mg/dL — ABNORMAL LOW (ref 8.9–10.3)
Chloride: 98 mmol/L (ref 98–111)
Creatinine, Ser: 0.7 mg/dL (ref 0.44–1.00)
GFR, Estimated: >60 mL/min (ref >60)
Glucose, Bld: 100 mg/dL — ABNORMAL HIGH (ref 70–99)
Potassium: 3.9 mmol/L (ref 3.5–5.1)
Sodium: 136 mmol/L (ref 135–145)

## 2023-04-12 LAB — BRAIN NATRIURETIC PEPTIDE: B Natriuretic Peptide: 135 pg/mL — ABNORMAL HIGH (ref 0.0–100.0)

## 2023-04-12 LAB — CBC
HCT: 40.3 % (ref 36.0–46.0)
Hemoglobin: 12.9 g/dL (ref 12.0–15.0)
MCH: 30.8 pg (ref 26.0–34.0)
MCHC: 32 g/dL (ref 30.0–36.0)
MCV: 96.2 fL (ref 80.0–100.0)
Platelets: 262 10*3/uL (ref 150–400)
RBC: 4.19 MIL/uL (ref 3.87–5.11)
RDW: 18.6 % — ABNORMAL HIGH (ref 11.5–15.5)
WBC: 12.2 10*3/uL — ABNORMAL HIGH (ref 4.0–10.5)
nRBC: 0 % (ref 0.0–0.2)

## 2023-04-12 LAB — TROPONIN I (HIGH SENSITIVITY)
Troponin I (High Sensitivity): 4 ng/L (ref <18)
Troponin I (High Sensitivity): 4 ng/L (ref <18)

## 2023-04-12 LAB — HEPATIC FUNCTION PANEL
ALT: 29 U/L (ref 0–44)
AST: 19 U/L (ref 15–41)
Albumin: 1.5 g/dL — ABNORMAL LOW (ref 3.5–5.0)
Alkaline Phosphatase: 129 U/L — ABNORMAL HIGH (ref 38–126)
Bilirubin, Direct: 0.2 mg/dL (ref 0.0–0.2)
Indirect Bilirubin: 0.5 mg/dL (ref 0.3–0.9)
Total Bilirubin: 0.7 mg/dL (ref 0.3–1.2)
Total Protein: 4.9 g/dL — ABNORMAL LOW (ref 6.5–8.1)

## 2023-04-12 MED ORDER — ALBUMIN HUMAN 25 % IV SOLN
25.0000 g | Freq: Once | INTRAVENOUS | Status: AC
  Administered 2023-04-12: 25 g via INTRAVENOUS

## 2023-04-12 MED ORDER — SODIUM CHLORIDE 0.9 % IV BOLUS
500.0000 mL | Freq: Once | INTRAVENOUS | Status: AC
  Administered 2023-04-12: 500 mL via INTRAVENOUS

## 2023-04-12 NOTE — ED Triage Notes (Signed)
Pt sent from Dr. Henreitta Leber' office for eval of hypotension 80/40 with SOB, recent bowel surgery April 19th. Pt now has swelling of bilateral lower legs and feet with weeping reported. No prior hx CHF

## 2023-04-12 NOTE — ED Provider Notes (Signed)
[place] EMERGENCY DEPARTMENT AT [facility] Provider Note   CSN: [id number] Arrival date & time: 04/12/23  1627     History  Chief Complaint  Patient presents with   Hypotension    Kayla Dean is a 72 y.o. female.  HPI     Kayla Dean is a 72 y.o. female with past medical history of hypothyroidism, aortic aneurysm, hiatal hernia with history of gastric bypass who presents to the Emergency Department under the advisement of her general surgeon.  Patient underwent exploratory laparotomy and reduction of incarcerated hernia in April of this year.  Was seen today in clinic for postop follow-up was noted to have bilateral lower extremity edema and serous drainage.  No prior history of CHF.  Was referred to cardiology and started on 40 mg Lasix with no improvement.  Lasix dose was increased to 80 mg that she has taken 1 dose yesterday.  Does have some dyspnea on exertion and intermittent chest tightness.  Had echocardiogram in 2019 that showed EF of 60 to 65%, she had coronary CT in February of this year that showed mild nonobstructive CAD.  Was noted in the surgeon's office to have blood pressure of 80/40.  Sent to the ER for further evaluation.  Home Medications Prior to Admission medications   Medication Sig Start Date End Date Taking? Authorizing Provider  acetaminophen (TYLENOL) 500 MG tablet Take 1,000 mg by mouth every 8 (eight) hours as needed for moderate pain.    [provider]  Calcium Carb-Cholecalciferol (CALCIUM 600 + D) 600-200 MG-UNIT TABS Take 2 tablets by mouth every morning.    [provider]  Cholecalciferol (VITAMIN D-3 PO) Take 1 capsule by mouth daily.    [provider]  ferrous sulfate 325 (65 FE) MG tablet Take 325 mg by mouth every morning.    [provider]  fluticasone (FLONASE ALLERGY RELIEF) 50 MCG/ACT nasal spray Place 1 spray into both nostrils daily.    [provider]  furosemide  (LASIX) 80 MG tablet Take 1 tablet (80 mg total) by mouth daily. 04/10/23 07/09/23  [name], MD  levothyroxine (SYNTHROID, LEVOTHROID) 137 MCG tablet Take 137 mcg by mouth daily before breakfast.    [provider]  Multiple Vitamin (MULTIVITAMIN WITH MINERALS) TABS tablet Take 1 tablet by mouth every morning.    [provider]  naproxen sodium (ALEVE) 220 MG tablet Take 220 mg by mouth daily as needed (pain).    [provider]  ondansetron (ZOFRAN-ODT) 4 MG disintegrating tablet Take 1 tablet (4 mg total) by mouth every 6 (six) hours as needed for nausea. 02/08/23   Lucretia Roers, MD  oxyCODONE (OXY IR/ROXICODONE) 5 MG immediate release tablet Take 1 tablet (5 mg total) by mouth every 4 (four) hours as needed for severe pain or breakthrough pain. 03/15/23   Lucretia Roers, MD  potassium chloride SA (KLOR-CON M) 20 MEQ tablet Take 1 tablet (20 mEq total) by mouth daily. 04/10/23   [name], MD  zolpidem (AMBIEN) 5 MG tablet Take 1 tablet (5 mg total) by mouth at bedtime as needed for sleep. 02/15/23   Lucretia Roers, MD      Allergies    Patient has no known allergies.    Review of Systems   Review of Systems  Constitutional:  Positive for fatigue. Negative for chills and fever.  Respiratory:  Positive for shortness of breath. Negative for chest tightness.   Cardiovascular:  Positive for chest pain and leg swelling.  Gastrointestinal:  Negative for diarrhea, nausea and vomiting.  Genitourinary:  Negative for dysuria.  Musculoskeletal:  Negative for back pain.  Neurological:  Negative for dizziness, weakness, numbness and headaches.  Psychiatric/Behavioral:  Negative for confusion.     Physical Exam Updated Vital Signs BP (!) 86/66   Pulse 78   Temp 97.7 F (36.5 C) (Oral)   Resp 17   Ht 5\' 5"  (1.651 m)   Wt 60.8 kg   SpO2 92%   BMI 22.30 kg/m  Physical Exam Vitals and nursing note reviewed.  Constitutional:      General:  She is not in acute distress.    Appearance: Normal appearance. She is not ill-appearing or toxic-appearing.  Cardiovascular:     Rate and Rhythm: Normal rate and regular rhythm.     Pulses: Normal pulses.  Pulmonary:     Effort: Pulmonary effort is normal.  Abdominal:     Palpations: Abdomen is soft.     Tenderness: There is no abdominal tenderness.  Musculoskeletal:     Right lower leg: Edema present.     Left lower leg: Edema present.     Comments: 3+ pitting edema bilateral lower extremities to the level of the knees.  No excessive warmth or erythema.  Mild serous drainage noted with no open wounds.  Skin:    General: Skin is warm.     Capillary Refill: Capillary refill takes less than 2 seconds.     Findings: No bruising or erythema.  Neurological:     General: No focal deficit present.     Mental Status: She is alert.     Sensory: No sensory deficit.     Motor: No weakness.     ED Results / Procedures / Treatments   Labs (all labs ordered are listed, but only abnormal results are displayed) Labs Reviewed  BASIC METABOLIC PANEL - Abnormal; Notable for the following components:      Result Value   Glucose, Bld 100 (*)    Calcium 7.7 (*)    All other components within normal limits  CBC - Abnormal; Notable for the following components:   WBC 12.2 (*)    RDW 18.6 (*)    All other components within normal limits  BRAIN NATRIURETIC PEPTIDE - Abnormal; Notable for the following components:   B Natriuretic Peptide 135.0 (*)    All other components within normal limits  HEPATIC FUNCTION PANEL - Abnormal; Notable for the following components:   Total Protein 4.9 (*)    Albumin 1.5 (*)    Alkaline Phosphatase 129 (*)    All other components within normal limits  TROPONIN I (HIGH SENSITIVITY)  TROPONIN I (HIGH SENSITIVITY)    EKG None  Radiology DG Chest Portable 1 View  Result Date: 04/12/2023 CLINICAL DATA:  Shortness of breath. EXAM: PORTABLE CHEST 1 VIEW  COMPARISON:  February 02, 2023 FINDINGS: The heart size and mediastinal contours are within normal limits. Mild atelectasis is seen within the bilateral lung bases. Small bilateral pleural effusions are noted. No pneumothorax is identified. A radiopaque surgical pin is seen overlying the right humeral head. Multilevel degenerative changes seen throughout the thoracic spine. IMPRESSION: Mild bibasilar atelectasis with small bilateral pleural effusions. Electronically Signed   By: Aram Candela M.D.   On: 04/12/2023 21:29    Procedures Procedures    CRITICAL CARE Performed by: Yani Lal Total critical care time: 35 minutes Critical care time was exclusive of  separately billable procedures and treating other patients. Critical care was necessary to treat or prevent imminent or life-threatening deterioration. Critical care was time spent personally by me on the following activities: development of treatment plan with patient and/or surrogate as well as nursing, discussions with consultants, evaluation of patient's response to treatment, examination of patient, obtaining history from patient or surrogate, ordering and performing treatments and interventions, ordering and review of laboratory studies, ordering and review of radiographic studies, pulse oximetry and re-evaluation of patient's condition.   Medications Ordered in ED Medications  albumin human 25 % solution 25 g (has no administration in time range)  sodium chloride 0.9 % bolus 500 mL (has no administration in time range)    ED Course/ Medical Decision Making/ A&P                             Medical Decision Making Patient here for bilateral lower extremity edema.  Underwent exploratory laparotomy and incarcerated hernia reduction in April of this year by local surgery.  Family notes peripheral edema shortly after her surgery.  No improvement with diuretic.  Some mild dyspnea on exertion, no history of CHF.  Patient also noted to  have low albumin  Differential would include but not limited to lymphedema, onset CHF, peripheral edema  See note from general surgery from earlier today  Amount and/or Complexity of Data Reviewed Labs: ordered.    Details: Labs interpreted by me, no significant leukocytosis, hemoglobin unremarkable.  BNP slightly elevated at 135.  Chemistries without significant derangement.  Hepatic function shows hypoalbuminemia at 1.5, troponin remains flat  Radiology: ordered.    Details: Chest x-ray shows mild bibasilar atelectasis with small bilateral pleural effusion ECG/medicine tests: ordered.    Details: EKG shows normal sinus rhythm with left axis deviation and T wave abnormality Discussion of management or test interpretation with external provider(s): On recheck, patient still hypotensive, resting comfortably.  No increased work of breathing on exam.  No hypoxia tachycardia or tachypnea.  Small amount of IV fluids given here along with albumin  Discussed with cardiology, Dr. Wilkie Aye who recommends hospital admission and cardiology will consult tomorrow.  Felt that patient is appropriate for admission here at [facility].  plan for repeat echocardiogram.  If patient becomes unstable, he is agreeable to transfer to [facility] and cardiology will see her there  Discussed with [facility] hospitalist, Dr. Thomes Dinning who agrees to admit.  Risk Prescription drug management. Decision regarding hospitalization.           Final Clinical Impression(s) / ED Diagnoses Final diagnoses:  Peripheral edema    Rx / DC Orders ED Discharge Orders     None         Rosey Bath 04/12/23 2330    Kommor, Wyn Forster, MD 04/13/23 0300

## 2023-04-12 NOTE — Progress Notes (Signed)
[facility] Note   HPI:  72 y.o. Female presents to clinic for post-op follow-up evaluation after undergoing and Ex lap and reduction of incarcerated hernia 4/19 with viable bowel intraoperatively. She had very odd anatomy with the alimentary limb having an additional anastomosis.  She has had a complicated picture post operatively with some right sided pain and edematous bowel and mesentery and a question of a small area of intussusception that was not obstructive on CT scan. We tried a low residue diet to see if the bowel would heal, and she has been eating and tolerating diet for the most part. Having Bms every day to every other day but she continues to have this right sided pain at times.   She has had occasional some nausea. She went to the beach last week and did not feel great but went out on the beach twice. She did have some issues with feeling weak.  She has continued to have bilateral lower extremity edema and has been referred to cardiology and was on lasix. The edema continues and doppler done last month was negative.  She has not had improvement with the lasix and cardiology sees her July and ECHO in August.  Her lab work has been reassuring but she did have a low albumin in May.   She is just not feeling great today. She started on 80 mg of lasix and her BP today is 80/40.    Review of Systems:  Weakness Poor appetite Regular Bms for the most part, color less dark  Some right sided abdominal pain at times  All other review of systems: otherwise negative   Vital Signs:  BP (!) 80/40   Pulse (!) 105   Temp 97.8 F (36.6 C) (Oral)   Resp 14   Ht 5\' 5"  (1.651 m)   Wt 134 lb (60.8 kg)   SpO2 95%   BMI 22.30 kg/m    Physical Exam:  Physical Exam HENT:     Head: Normocephalic.     Nose: Nose normal.  Pulmonary:     Effort: Pulmonary effort is normal.     Breath sounds: Normal breath sounds.  Abdominal:     General: There is no distension.      Palpations: Abdomen is soft.     Tenderness: There is abdominal tenderness. There is no guarding.     Comments: Midline healed  Musculoskeletal:     Right lower leg: Edema present.     Left lower leg: Edema present.  Neurological:     General: No focal deficit present.     Mental Status: She is alert and oriented to person, place, and time.     Reviewed labs and CT scans- normal creatinine, K wnl, albumin from 5.2024 low at 1.9, Ct with small bowel and mesenteric edema, non obstructive intussusception appeared stable on my review from 5/15 to 5/24    Assessment:  72 y.o. yo Female with a strange presentation of bilateral lower extremity swelling and continued Right sided pain and concern for small bowel edema and mesenteric edema on recent imaging. The bowel was viable intraoperatively and she was able to eat and diet was adv but she has not really improved. I do not know if the bowel is just so congested from the incarceration and is not able to heal or has strictured. She has tried a low residue diet to see if that helped but she continues to have issues. I am not sure why she is  having the bilateral lower extremity edema and I cannot really explain why the intestine issues would cause this unless she has some degree of CHF that we have not diagnosed due to no ECHO yet or if she is just third spacing from low albumin from poor intake from the bowel.   I have explained to the family that we have tried these conservative measures and have tried to get her straightened out as an outpatient but she just is not improving.   I sent Sharlene Dory NP Cardiology a message about her BP today and her lasix dose, and they do think she needs to go to the ED to be evaluated.  I have sent the ED an extensive note on EPIC about the history and let them know what we are dealing with.     Plan:  ED now to be evaluated for BLE swelling  Bilateral lower extremity swelling- rule out CHF? Needs ECHO and work  up Albumin needs to be checked as she could be third spacing Told ED about swelling and her standing pain. They can decide if they think any abdominal imaging needed. Otherwise I have a CT ordered now for July 10 in follow up.   CT ordered for July 10 I am worried if she needs a revision due to her complicated anatomy see my Operative note from 4/19 as she has multiple anastomosis that she will need to be optimized before any revision and may even need TPN to get her nutrition better.  She had her prior surgeries at [facility] but wants to keep her surgeries if any required at [facility]. I have let Dr. Lovell Sheehan know the patient was going to the ED and I have talked to the patient in the past about surgeon options at [facility]. Dr. Gaynelle Adu is recommended as the surgeon that does the most bariatric. I hope she is not going to need that route and her intestines continue to improve but I want to be upfront with the patient and family.   If I need to refer her to Dr. Andrey Campanile, I will let him know the history and story as this is complicated and is nothing to rush into surgically.    Algis Greenhouse, MD [facility] [address] [place], [place] [address] [phone] (office)

## 2023-04-12 NOTE — Patient Instructions (Signed)
Go to the ED to get evaluated  Will set up for CT scan

## 2023-04-12 NOTE — ED Notes (Signed)
Still no answer from [facility] at this time re paged at this time.Misty Stanley

## 2023-04-12 NOTE — H&P (Signed)
History and Physical    Patient: Kayla Dean:[id number] DOB: 01/23/1951 DOA: 04/12/2023 DOS: the patient was seen and examined on 04/13/2023 PCP: [facility]  Patient coming from: Home  Chief Complaint:  Chief Complaint  Patient presents with   Hypotension   HPI: Kayla Dean is a 72 y.o. female with medical history significant of thyroidism, aortic aneurysm, hiatal hernia, history of gastric bypass who presents to the emergency department from her general surgeon's office due to low blood pressure.  Patient underwent exploratory laparotomy, reduction of incarcerated hernia and primary hernia repair on 02/02/2023 by Dr. Henreitta Leber.  She had a post op follow-up visit and she was noted to have bilateral lower extremity edema, so she was referred to cardiology L was started on Lasix 40 mg orally daily with no improvement, this was recently increased to 80 mg.  She complains of occasional chest tightness and some dyspnea on exertion and some dyspnea on exertion. Patient was noted to have a BP of 80/40 at the surgeon's office today, so she was asked to go to the ED for further evaluation and management.  ED Course:  In the emergency department, BP was 98/76, other vital signs were within normal range.  Workup in the ED showed normal CBC except for WBC of 12.2, BMP was normal except for blood glucose of 100, albumin 1.5, ALP 129, troponin x 2 was flat at 4. Chest x-ray showed mild bibasilar atelectasis with small bilateral pleural effusions Albumin 25 mg x 1 and IV NAS 500 mL was provided. Cardiologist (Dr. Wilkie Aye) was consulted and recommended admitting patient and to consult cardiology to see patient in the morning. Hospitalist was asked to admit patient for further evaluation and management.  Review of Systems: Review of systems as noted in the HPI. All other systems reviewed and are negative.   Past Medical History:  Diagnosis Date   Aortic aneurysm  (HCC)    Arthritis    H/O gastric bypass    Hiatal hernia    Hypothyroidism    Small bowel obstruction (HCC)    Past Surgical History:  Procedure Laterality Date   ABDOMINAL HYSTERECTOMY     CHOLECYSTECTOMY     COLON SURGERY     HERNIA REPAIR     LAPAROTOMY N/A 02/02/2023   Procedure: EXPLORATORY LAPAROTOMY, [name], EXPLANT OF ABDOMINAL MESH;  Surgeon: Lucretia Roers, MD;  Location: [facility];  Service: General;  Laterality: N/A;   REPLACEMENT TOTAL KNEE Right    ROTATOR CUFF REPAIR Right    TOTAL HIP ARTHROPLASTY Right     Social History:  reports that she has never smoked. She has never used smokeless tobacco. She reports that she does not drink alcohol and does not use drugs.   No Known Allergies  Family History  Problem Relation Age of Onset   CAD Father    CAD Brother      Prior to Admission medications   Medication Sig Start Date End Date Taking? Authorizing Provider  acetaminophen (TYLENOL) 500 MG tablet Take 1,000 mg by mouth every 8 (eight) hours as needed for moderate pain.    [provider]  Calcium Carb-Cholecalciferol (CALCIUM 600 + D) 600-200 MG-UNIT TABS Take 2 tablets by mouth every morning.    [provider]  Cholecalciferol (VITAMIN D-3 PO) Take 1 capsule by mouth daily.    [provider]  ferrous sulfate 325 (65 FE) MG tablet Take 325 mg  by mouth every morning.    [provider]  fluticasone (FLONASE ALLERGY RELIEF) 50 MCG/ACT nasal spray Place 1 spray into both nostrils daily.    [provider]  furosemide (LASIX) 80 MG tablet Take 1 tablet (80 mg total) by mouth daily. 04/10/23 07/09/23  [name], MD  levothyroxine (SYNTHROID, LEVOTHROID) 137 MCG tablet Take 137 mcg by mouth daily before breakfast.    [provider]  Multiple Vitamin (MULTIVITAMIN WITH MINERALS) TABS tablet Take 1 tablet by mouth every morning.    [provider]  naproxen sodium  (ALEVE) 220 MG tablet Take 220 mg by mouth daily as needed (pain).    [provider]  ondansetron (ZOFRAN-ODT) 4 MG disintegrating tablet Take 1 tablet (4 mg total) by mouth every 6 (six) hours as needed for nausea. 02/08/23   Lucretia Roers, MD  oxyCODONE (OXY IR/ROXICODONE) 5 MG immediate release tablet Take 1 tablet (5 mg total) by mouth every 4 (four) hours as needed for severe pain or breakthrough pain. 03/15/23   Lucretia Roers, MD  potassium chloride SA (KLOR-CON M) 20 MEQ tablet Take 1 tablet (20 mEq total) by mouth daily. 04/10/23   [name], MD  zolpidem (AMBIEN) 5 MG tablet Take 1 tablet (5 mg total) by mouth at bedtime as needed for sleep. 02/15/23   Lucretia Roers, MD    Physical Exam: BP (!) 74/58   Pulse 79   Temp 98.2 F (36.8 C)   Resp 18   Ht 5\' 5"  (1.651 m)   Wt 60.8 kg   SpO2 91%   BMI 22.30 kg/m   General: 72 y.o. year-old female well developed well nourished in no acute distress.  Alert and oriented x3. HEENT: NCAT, EOMI Neck: Supple, trachea medial Cardiovascular: Regular rate and rhythm with no rubs or gallops.  No thyromegaly or JVD noted.  +2 lower extremity edema bilaterally to the knees. 2/4 pulses in all 4 extremities. Respiratory: Clear to auscultation with no wheezes or rales. Good inspiratory effort. Abdomen: Soft, nontender nondistended with normal bowel sounds x4 quadrants. Muskuloskeletal: No cyanosis, clubbing noted bilaterally Neuro: CN II-XII intact, strength 5/5 x 4, sensation, reflexes intact Skin: No ulcerative lesions noted or rashes Psychiatry: Judgement and insight appear normal. Mood is appropriate for condition and setting          Labs on Admission:  Basic Metabolic Panel: Recent Labs  Lab 04/10/23 1248 04/12/23 1730  NA 134* 136  K 3.5 3.9  CL 100 98  CO2 28 29  GLUCOSE 95 100*  BUN 14 14  CREATININE 0.68 0.70  CALCIUM 7.1* 7.7*   Liver Function Tests: Recent Labs  Lab 04/12/23 1730  AST 19   ALT 29  ALKPHOS 129*  BILITOT 0.7  PROT 4.9*  ALBUMIN 1.5*   No results for input(s): "LIPASE", "AMYLASE" in the last 168 hours. No results for input(s): "AMMONIA" in the last 168 hours. CBC: Recent Labs  Lab 04/12/23 1730  WBC 12.2*  HGB 12.9  HCT 40.3  MCV 96.2  PLT 262   Cardiac Enzymes: No results for input(s): "CKTOTAL", "CKMB", "CKMBINDEX", "TROPONINI" in the last 168 hours.  BNP (last 3 results) Recent Labs    03/21/23 1146 04/12/23 1730  BNP 70.0 135.0*    ProBNP (last 3 results) No results for input(s): "PROBNP" in the last 8760 hours.  CBG: No results for input(s): "GLUCAP" in the last 168 hours.  Radiological Exams on Admission: DG Chest  Portable 1 View  Result Date: 04/12/2023 CLINICAL DATA:  Shortness of breath. EXAM: PORTABLE CHEST 1 VIEW COMPARISON:  February 02, 2023 FINDINGS: The heart size and mediastinal contours are within normal limits. Mild atelectasis is seen within the bilateral lung bases. Small bilateral pleural effusions are noted. No pneumothorax is identified. A radiopaque surgical pin is seen overlying the right humeral head. Multilevel degenerative changes seen throughout the thoracic spine. IMPRESSION: Mild bibasilar atelectasis with small bilateral pleural effusions. Electronically Signed   By: Aram Candela M.D.   On: 04/12/2023 21:29    EKG: I independently viewed the EKG done and my findings are as followed: Normal sinus rhythm at a rate of 94 bpm  Assessment/Plan Present on Admission:  Peripheral edema  Acquired hypothyroidism  Principal Problem:   Hypotension Active Problems:   Acquired hypothyroidism   Peripheral edema   Elevated brain natriuretic peptide (BNP) level   Hypoalbuminemia due to protein-calorie malnutrition (HCC)  Hypotension Patient has prior history of diastolic CHF, last echocardiogram in October 2019 showed LVEF of 66 5%.  No RWMA.  G1 DD IV albumin given with minimal transitory increased BP TSH will  be checked Cardiologist will be consulted in the morning  Elevated BNP rule out acute on chronic diastolic CHF BNP 161 Peripheral edema Chest x-ray indicative of bilateral pleural effusion Patient with bilateral peripheral edema Continue total input/output, daily weights and fluid restriction Continue heart healthy diet  Echocardiogram in the morning   Hypoalbuminemia Albumin 1.5, IV albumin was provided Patient presents with peripheral edema, uncertain if it is due to diastolic CHF BASELINE due to hypoalbuminemia Cardiology was consulted and we shall await further recommendations  Hypothyroidism Continue Synthroid   DVT prophylaxis: Lovenox   Advance Care Planning: Full code  Consults: Cardiology  Family Communication: None at bedside  Severity of Illness: The appropriate patient status for this patient is INPATIENT. Inpatient status is judged to be reasonable and necessary in order to provide the required intensity of service to ensure the patient's safety. The patient's presenting symptoms, physical exam findings, and initial radiographic and laboratory data in the context of their chronic comorbidities is felt to place them at high risk for further clinical deterioration. Furthermore, it is not anticipated that the patient will be medically stable for discharge from the hospital within 2 midnights of admission.   * I certify that at the point of admission it is my clinical judgment that the patient will require inpatient hospital care spanning beyond 2 midnights from the point of admission due to high intensity of service, high risk for further deterioration and high frequency of surveillance required.*  Author: Frankey Shown, DO 04/13/2023 4:54 AM  For on call review [email or web address].

## 2023-04-13 ENCOUNTER — Inpatient Hospital Stay (HOSPITAL_COMMUNITY): Payer: Medicare Other

## 2023-04-13 ENCOUNTER — Other Ambulatory Visit (HOSPITAL_COMMUNITY): Payer: Self-pay | Admitting: *Deleted

## 2023-04-13 DIAGNOSIS — I5031 Acute diastolic (congestive) heart failure: Secondary | ICD-10-CM

## 2023-04-13 DIAGNOSIS — I77810 Thoracic aortic ectasia: Secondary | ICD-10-CM | POA: Diagnosis not present

## 2023-04-13 DIAGNOSIS — I2699 Other pulmonary embolism without acute cor pulmonale: Secondary | ICD-10-CM

## 2023-04-13 DIAGNOSIS — I950 Idiopathic hypotension: Secondary | ICD-10-CM | POA: Diagnosis not present

## 2023-04-13 DIAGNOSIS — R600 Localized edema: Secondary | ICD-10-CM

## 2023-04-13 DIAGNOSIS — I2690 Septic pulmonary embolism without acute cor pulmonale: Secondary | ICD-10-CM

## 2023-04-13 DIAGNOSIS — I959 Hypotension, unspecified: Secondary | ICD-10-CM | POA: Diagnosis not present

## 2023-04-13 DIAGNOSIS — E8809 Other disorders of plasma-protein metabolism, not elsewhere classified: Secondary | ICD-10-CM | POA: Diagnosis present

## 2023-04-13 DIAGNOSIS — E039 Hypothyroidism, unspecified: Secondary | ICD-10-CM | POA: Diagnosis not present

## 2023-04-13 DIAGNOSIS — I351 Nonrheumatic aortic (valve) insufficiency: Secondary | ICD-10-CM

## 2023-04-13 DIAGNOSIS — I2510 Atherosclerotic heart disease of native coronary artery without angina pectoris: Secondary | ICD-10-CM | POA: Diagnosis not present

## 2023-04-13 DIAGNOSIS — R7989 Other specified abnormal findings of blood chemistry: Secondary | ICD-10-CM | POA: Diagnosis present

## 2023-04-13 HISTORY — DX: Other pulmonary embolism without acute cor pulmonale: I26.99

## 2023-04-13 LAB — ECHOCARDIOGRAM COMPLETE
AR max vel: 1.72 cm2
AV Area VTI: 1.92 cm2
AV Area mean vel: 2.05 cm2
AV Mean grad: 6 mmHg
AV Peak grad: 14.7 mmHg
Ao pk vel: 1.92 m/s
Area-P 1/2: 2.45 cm2
Height: 65 in
P 1/2 time: 778 msec
S' Lateral: 2.3 cm
Weight: 2144 oz

## 2023-04-13 LAB — COMPREHENSIVE METABOLIC PANEL
ALT: 19 U/L (ref 0–44)
AST: 13 U/L — ABNORMAL LOW (ref 15–41)
Albumin: <1.5 g/dL — ABNORMAL LOW (ref 3.5–5.0)
Alkaline Phosphatase: 86 U/L (ref 38–126)
Anion gap: 5 (ref 5–15)
BUN: 15 mg/dL (ref 8–23)
CO2: 28 mmol/L (ref 22–32)
Calcium: 6.8 mg/dL — ABNORMAL LOW (ref 8.9–10.3)
Chloride: 102 mmol/L (ref 98–111)
Creatinine, Ser: 0.65 mg/dL (ref 0.44–1.00)
GFR, Estimated: >60 mL/min (ref >60)
Glucose, Bld: 79 mg/dL (ref 70–99)
Potassium: 3.7 mmol/L (ref 3.5–5.1)
Sodium: 135 mmol/L (ref 135–145)
Total Bilirubin: 0.8 mg/dL (ref 0.3–1.2)
Total Protein: 3.6 g/dL — ABNORMAL LOW (ref 6.5–8.1)

## 2023-04-13 LAB — CBC
HCT: 29.9 % — ABNORMAL LOW (ref 36.0–46.0)
Hemoglobin: 9.6 g/dL — ABNORMAL LOW (ref 12.0–15.0)
MCH: 31.3 pg (ref 26.0–34.0)
MCHC: 32.1 g/dL (ref 30.0–36.0)
MCV: 97.4 fL (ref 80.0–100.0)
Platelets: 163 10*3/uL (ref 150–400)
RBC: 3.07 MIL/uL — ABNORMAL LOW (ref 3.87–5.11)
RDW: 18.7 % — ABNORMAL HIGH (ref 11.5–15.5)
WBC: 7.4 10*3/uL (ref 4.0–10.5)
nRBC: 0 % (ref 0.0–0.2)

## 2023-04-13 LAB — PHOSPHORUS: Phosphorus: 3.4 mg/dL (ref 2.5–4.6)

## 2023-04-13 LAB — MRSA NEXT GEN BY PCR, NASAL: MRSA by PCR Next Gen: NOT DETECTED

## 2023-04-13 LAB — HEPARIN LEVEL (UNFRACTIONATED): Heparin Unfractionated: 0.38 IU/mL (ref 0.30–0.70)

## 2023-04-13 LAB — TSH: TSH: 3.284 u[IU]/mL (ref 0.350–4.500)

## 2023-04-13 LAB — MAGNESIUM: Magnesium: 2.1 mg/dL (ref 1.7–2.4)

## 2023-04-13 MED ORDER — ACETAMINOPHEN 325 MG PO TABS
650.0000 mg | ORAL_TABLET | Freq: Four times a day (QID) | ORAL | Status: DC | PRN
  Administered 2023-04-14 – 2023-04-24 (×12): 650 mg via ORAL
  Filled 2023-04-13 (×12): qty 2

## 2023-04-13 MED ORDER — BOOST / RESOURCE BREEZE PO LIQD CUSTOM
1.0000 | Freq: Three times a day (TID) | ORAL | Status: DC
  Administered 2023-04-13 – 2023-04-25 (×27): 1 via ORAL
  Filled 2023-04-13 (×6): qty 1

## 2023-04-13 MED ORDER — MIDODRINE HCL 5 MG PO TABS
10.0000 mg | ORAL_TABLET | Freq: Three times a day (TID) | ORAL | Status: DC
  Administered 2023-04-13 – 2023-04-19 (×17): 10 mg via ORAL
  Filled 2023-04-13 (×16): qty 2

## 2023-04-13 MED ORDER — ENOXAPARIN SODIUM 40 MG/0.4ML IJ SOSY
40.0000 mg | PREFILLED_SYRINGE | INTRAMUSCULAR | Status: DC
  Administered 2023-04-13: 40 mg via SUBCUTANEOUS
  Filled 2023-04-13: qty 0.4

## 2023-04-13 MED ORDER — CHLORHEXIDINE GLUCONATE CLOTH 2 % EX PADS
6.0000 | MEDICATED_PAD | Freq: Every day | CUTANEOUS | Status: DC
  Administered 2023-04-13 – 2023-04-26 (×13): 6 via TOPICAL

## 2023-04-13 MED ORDER — IOHEXOL 9 MG/ML PO SOLN
ORAL | Status: AC
  Filled 2023-04-13: qty 1000

## 2023-04-13 MED ORDER — ONDANSETRON HCL 4 MG PO TABS
4.0000 mg | ORAL_TABLET | Freq: Four times a day (QID) | ORAL | Status: DC | PRN
  Filled 2023-04-13: qty 1

## 2023-04-13 MED ORDER — ONDANSETRON HCL 4 MG/2ML IJ SOLN
4.0000 mg | Freq: Four times a day (QID) | INTRAMUSCULAR | Status: DC | PRN
  Administered 2023-04-16: 4 mg via INTRAVENOUS
  Filled 2023-04-13: qty 2

## 2023-04-13 MED ORDER — ACETAMINOPHEN 650 MG RE SUPP: 650.0000 mg | Freq: Four times a day (QID) | RECTAL | Status: DC | PRN

## 2023-04-13 MED ORDER — HEPARIN (PORCINE) 25000 UT/250ML-% IV SOLN
1000.0000 [IU]/h | INTRAVENOUS | Status: DC
  Administered 2023-04-13 – 2023-04-16 (×4): 1000 [IU]/h via INTRAVENOUS
  Filled 2023-04-13 (×4): qty 250

## 2023-04-13 MED ORDER — ALBUMIN HUMAN 25 % IV SOLN
12.5000 g | Freq: Once | INTRAVENOUS | Status: AC
  Administered 2023-04-13: 12.5 g via INTRAVENOUS
  Filled 2023-04-13: qty 50

## 2023-04-13 MED ORDER — HEPARIN BOLUS VIA INFUSION
3000.0000 [IU] | Freq: Once | INTRAVENOUS | Status: AC
  Administered 2023-04-13: 3000 [IU] via INTRAVENOUS

## 2023-04-13 MED ORDER — IOHEXOL 300 MG/ML  SOLN
100.0000 mL | Freq: Once | INTRAMUSCULAR | Status: AC | PRN
  Administered 2023-04-13: 85 mL via INTRAVENOUS

## 2023-04-13 MED ORDER — MIDODRINE HCL 5 MG PO TABS
5.0000 mg | ORAL_TABLET | Freq: Three times a day (TID) | ORAL | Status: DC
  Administered 2023-04-13 (×3): 5 mg via ORAL
  Filled 2023-04-13 (×3): qty 1

## 2023-04-13 MED ORDER — PROSOURCE PLUS PO LIQD
30.0000 mL | Freq: Two times a day (BID) | ORAL | Status: DC
  Administered 2023-04-14 – 2023-04-16 (×5): 30 mL via ORAL
  Filled 2023-04-13 (×5): qty 30

## 2023-04-13 MED ORDER — LEVOTHYROXINE SODIUM 25 MCG PO TABS
137.0000 ug | ORAL_TABLET | Freq: Every day | ORAL | Status: DC
  Administered 2023-04-13 – 2023-04-26 (×14): 137 ug via ORAL
  Filled 2023-04-13 (×15): qty 1

## 2023-04-13 NOTE — Progress Notes (Signed)
ANTICOAGULATION CONSULT NOTE - Initial Consult  Pharmacy Consult for heparin Indication: pulmonary embolus  No Known Allergies  Patient Measurements: Height: 5\' 5"  (165.1 cm) Weight: 60.8 kg (134 lb) IBW/kg (Calculated) : 57 Heparin Dosing Weight: 60.8 kg  Vital Signs: Temp: 98 F (36.7 C) (06/28 1229) Temp Source: Oral (06/28 0817) BP: 91/59 (06/28 1330) Pulse Rate: 58 (06/28 1330)  Labs: Recent Labs    04/12/23 1730 04/12/23 1935 04/13/23 0458  HGB 12.9  --  9.6*  HCT 40.3  --  29.9*  PLT 262  --  163  CREATININE 0.70  --  0.65  TROPONINIHS 4 4  --     Estimated Creatinine Clearance: 57.2 mL/min (by C-G formula based on SCr of 0.65 mg/dL).   Medical History: Past Medical History:  Diagnosis Date   Aortic aneurysm (HCC)    Arthritis    H/O gastric bypass    Hiatal hernia    Hypothyroidism    Small bowel obstruction (HCC)     Medications:  (Not in a hospital admission)   Assessment: Pharmacy consulted to dose heparin in patient with pulmonary embolism.  Patient is not on anticoagulation prior to admission but received lovenox 40 mg subcutaneous 6/28 @ 0613.  Hgb 9.6  Goal of Therapy:  Heparin level 0.3-0.7 units/ml Monitor platelets by anticoagulation protocol: Yes   Plan:  Give 3000 units bolus x 1 Start heparin infusion at 1000 units/hr Check anti-Xa level in 8 hours and daily while on heparin Continue to monitor H&H and platelets  Judeth Cornfield, PharmD Clinical Pharmacist 04/13/2023 3:18 PM

## 2023-04-13 NOTE — Progress Notes (Signed)
Patient seen in follow-up with Dr. Henreitta Leber.  While getting a CT scan of the abdomen and pelvis the patient was incidentally found to have a pulmonary embolus.  Her previously known mesenteric edematous bowel was not any different than before.  Her abdominal examination has not significantly changed.  Obviously, she will be undergoing treatment for the newly diagnosed pulmonary embolus.  Referral for bariatric revision will have to be deferred at the present time.  Will try to supplement patient's less than 1.5 albumin.  Discussed with Dr. Mariea Clonts.

## 2023-04-13 NOTE — Consult Note (Addendum)
Cardiology Consultation   Patient ID: BROOKE-LYNN TRUST MRN: [id number]; DOB: [birth year]  Admit date: 04/12/2023 Date of Consult: 04/13/2023  PCP:  [facility]   [facility] Providers Cardiologist:  Nona Dell, MD        Patient Profile:   Kayla Dean is a 72 y.o. female with a hx of mild nonobstructive CAD by cardiac CTA in 11/2022, prior small bowel obstruction, history of gastric bypass, arthritis, palpitations (prior monitor showing PAC's PVC's and brief SVT), dilated ascending aortic aneurysm and hypothyroidism who is being seen 04/13/2023 for the evaluation of persistent hypotension and CHF at the request of Dr. Thomes Dinning.  History of Present Illness:   Kayla Dean was most recently admitted to [facility] in 01/2023 for evaluation of worsening abdominal pain and ultimately underwent exploratory laparotomy with reduction of incarcerated hernia and primary repair of a prior hernia with explant of mesh. No immediate complications were noted and she was discharged home.  She did contact our office in 02/2023 reporting worsening lower extremity edema since surgery and dopplers had been obtained which showed no evidence of DVT. It was recommended to take Lasix 40 mg daily for 5 to 7 days and obtain follow-up labs afterwards. She did report back on 03/21/2023 with a weight loss of 6 pounds with weight down to 134 lbs and said her edema had resolved.  She contacted the office again on 04/09/2023 reporting worsening lower extremity edema and was wearing compression stockings. Dr. Diona Browner recommended taking Lasix 80 mg daily along with potassium supplementation and arranging follow-up labs and a follow-up visit along with echocardiogram.  Was evaluated by her surgeon yesterday and was noted to be hypotensive with BP at 80/40, therefore she was sent to the ED for further evaluation. In talking with the patient today, she reports her appetite has been  minimal since surgery and she has been experiencing frequent nausea and vomiting. Says that her SBP has been less than 100 at home when checked over the past few weeks. She would experience improvement in her lower extremity when taking Lasix but said she was unable to tolerate Lasix 80 mg daily as she experienced significant weakness with this. She does report dizziness with positional changes but is currently asymptomatic at rest. She denies any associated chest pain or palpitations. No specific orthopnea or PND. Says her baseline weight is around 140 lbs and has been in the 130's more recently.   Initial labs showed WBC 12.2, Hgb 12.9, platelets 262, Na+ 136, K+ 3.9 creatinine 0.70.  BNP at 135.  Initial and repeat troponin values negative at 4.  Albumin less than 1.5.  CXR showing mild bibasilar atelectasis with small bilateral pleural effusions.  She has been started on Midodrine and is also receiving Albumin.  Past Medical History:  Diagnosis Date   Aortic aneurysm (HCC)    Arthritis    H/O gastric bypass    Hiatal hernia    Hypothyroidism    Small bowel obstruction (HCC)     Past Surgical History:  Procedure Laterality Date   ABDOMINAL HYSTERECTOMY     CHOLECYSTECTOMY     COLON SURGERY     HERNIA REPAIR     LAPAROTOMY N/A 02/02/2023   Procedure: EXPLORATORY LAPAROTOMY, [name], EXPLANT OF ABDOMINAL MESH;  Surgeon: Lucretia Roers, MD;  Location: [facility];  Service: General;  Laterality: N/A;   REPLACEMENT TOTAL KNEE Right    ROTATOR CUFF REPAIR  Right    TOTAL HIP ARTHROPLASTY Right      Home Medications:  Prior to Admission medications   Medication Sig Start Date End Date Taking? Authorizing Provider  acetaminophen (TYLENOL) 500 MG tablet Take 1,000 mg by mouth every 8 (eight) hours as needed for moderate pain.    [provider]  Calcium Carb-Cholecalciferol (CALCIUM 600 + D) 600-200 MG-UNIT TABS Take 2 tablets by mouth every morning.     [provider]  Cholecalciferol (VITAMIN D-3 PO) Take 1 capsule by mouth daily.    [provider]  ferrous sulfate 325 (65 FE) MG tablet Take 325 mg by mouth every morning.    [provider]  fluticasone (FLONASE ALLERGY RELIEF) 50 MCG/ACT nasal spray Place 1 spray into both nostrils daily.    [provider]  furosemide (LASIX) 80 MG tablet Take 1 tablet (80 mg total) by mouth daily. 04/10/23 07/09/23  [name], MD  levothyroxine (SYNTHROID, LEVOTHROID) 137 MCG tablet Take 137 mcg by mouth daily before breakfast.    [provider]  Multiple Vitamin (MULTIVITAMIN WITH MINERALS) TABS tablet Take 1 tablet by mouth every morning.    [provider]  naproxen sodium (ALEVE) 220 MG tablet Take 220 mg by mouth daily as needed (pain).    [provider]  ondansetron (ZOFRAN-ODT) 4 MG disintegrating tablet Take 1 tablet (4 mg total) by mouth every 6 (six) hours as needed for nausea. 02/08/23   Lucretia Roers, MD  oxyCODONE (OXY IR/ROXICODONE) 5 MG immediate release tablet Take 1 tablet (5 mg total) by mouth every 4 (four) hours as needed for severe pain or breakthrough pain. 03/15/23   Lucretia Roers, MD  potassium chloride SA (KLOR-CON M) 20 MEQ tablet Take 1 tablet (20 mEq total) by mouth daily. 04/10/23   [name], MD  zolpidem (AMBIEN) 5 MG tablet Take 1 tablet (5 mg total) by mouth at bedtime as needed for sleep. 02/15/23   Lucretia Roers, MD    Inpatient Medications: Scheduled Meds:  enoxaparin (LOVENOX) injection  40 mg Subcutaneous Q24H   levothyroxine  137 mcg Oral Q0600   midodrine  5 mg Oral TID WC   Continuous Infusions:   PRN Meds: acetaminophen **OR** acetaminophen, ondansetron **OR** ondansetron (ZOFRAN) IV  Allergies:   No Known Allergies  Social History:   Social History   Socioeconomic History   Marital status: Widowed    Spouse name: Not on file   Number of children: Not on file    Years of education: Not on file   Highest education level: Not on file  Occupational History   Not on file  Tobacco Use   Smoking status: Never   Smokeless tobacco: Never  Vaping Use   Vaping Use: Never used  Substance and Sexual Activity   Alcohol use: Never   Drug use: Never   Sexual activity: Not on file  Other Topics Concern   Not on file  Social History Narrative   Not on file   Social Determinants of Health   Financial Resource Strain: Not on file  Food Insecurity: No Food Insecurity (02/02/2023)   Hunger Vital Sign    Worried About Running Out of Food in the Last Year: Never true    Ran Out of Food in the Last Year: Never true  Transportation Needs: No Transportation Needs (02/02/2023)   PRAPARE - Administrator, Civil Service (Medical): No    Lack of Transportation (  Non-Medical): No  Physical Activity: Not on file  Stress: Not on file  Social Connections: Not on file  Intimate Partner Violence: Not At Risk (02/02/2023)   Humiliation, Afraid, Rape, and Kick questionnaire    Fear of Current or Ex-Partner: No    Emotionally Abused: No    Physically Abused: No    Sexually Abused: No    Family History:    Family History  Problem Relation Age of Onset   CAD Father    CAD Brother      ROS:  Please see the history of present illness.   All other ROS reviewed and negative.     Physical Exam/Data:   Vitals:   04/13/23 0900 04/13/23 0915 04/13/23 0930 04/13/23 0945  BP: (!) 89/62 (!) 84/63 (!) 82/60 (!) 83/60  Pulse: 71 68 68 67  Resp: 19 16 16 14   Temp:      TempSrc:      SpO2: 94% 98% 94% 94%  Weight:      Height:        Intake/Output Summary (Last 24 hours) at 04/13/2023 1118 Last data filed at 04/12/2023 2200 Gross per 24 hour  Intake 43 ml  Output --  Net 43 ml      04/12/2023    5:11 PM 04/12/2023    3:08 PM 03/15/2023    1:40 PM  Last 3 Weights  Weight (lbs) 134 lb 134 lb 140 lb  Weight (kg) 60.782 kg 60.782 kg 63.504 kg      Body mass index is 22.3 kg/m.  General: Pleasant, thin female appearing in no acute distress HEENT: normal Neck: no JVD Vascular: No carotid bruits; Distal pulses 2+ bilaterally Cardiac:  normal S1, S2; RRR; no murmur Lungs:  clear to auscultation bilaterally, no wheezing, rhonchi or rales  Abd: soft, nontender, no hepatomegaly  Ext: 2+ pitting edema bilaterally Musculoskeletal:  No deformities, BUE and BLE strength normal and equal Skin: warm and dry  Neuro:  CNs 2-12 intact, no focal abnormalities noted Psych:  Normal affect   EKG:  The EKG was personally reviewed and demonstrates:  NSR, HR 94 with LAD and slight TWI along the lateral leads.   Relevant CV Studies:  Coronary CT: 11/2022 IMPRESSION: 1. Mild nonobstructive CAD, CADRADS = 2.   2. Coronary calcium score of 122. This was 71st percentile for age and sex matched control.   3. Normal coronary origin with right dominance.   4.  Hiatal hernia present.   5.  Borderline dilation of ascending aorta at 39 mm.  Event Monitor: 11/2022 ZIO monitor reviewed.  3 days, 10 hours analyzed.   Predominant rhythm is sinus with heart rate ranging from 53 bpm up to 145 bpm and average heart rate 77 bpm. There were rare PACs including atrial couplets and triplets representing less than 1% total beats. There were rare PVCs including ventricular couplets representing less than 1% total beats.  Also limited episodes of ventricular bigeminy and trigeminy. There were two brief episodes of SVT, the longest of which lasted for 7 beats.  Neither of these were patient triggered events. No sustained arrhythmias or pauses.   Laboratory Data:  High Sensitivity Troponin:   Recent Labs  Lab 04/12/23 1730 04/12/23 1935  TROPONINIHS 4 4     Chemistry Recent Labs  Lab 04/10/23 1248 04/12/23 1730 04/13/23 0458  NA 134* 136 135  K 3.5 3.9 3.7  CL 100 98 102  CO2 28 29 28   GLUCOSE 95 100*  79  BUN 14 14 15   CREATININE 0.68 0.70 0.65   CALCIUM 7.1* 7.7* 6.8*  MG  --   --  2.1  GFRNONAA >60 >60 >60  ANIONGAP 6 9 5     Recent Labs  Lab 04/12/23 1730 04/13/23 0458  PROT 4.9* 3.6*  ALBUMIN 1.5* <1.5*  AST 19 13*  ALT 29 19  ALKPHOS 129* 86  BILITOT 0.7 0.8   Lipids No results for input(s): "CHOL", "TRIG", "HDL", "LABVLDL", "LDLCALC", "CHOLHDL" in the last 168 hours.  Hematology Recent Labs  Lab 04/12/23 1730 04/13/23 0458  WBC 12.2* 7.4  RBC 4.19 3.07*  HGB 12.9 9.6*  HCT 40.3 29.9*  MCV 96.2 97.4  MCH 30.8 31.3  MCHC 32.0 32.1  RDW 18.6* 18.7*  PLT 262 163   Thyroid  Recent Labs  Lab 04/13/23 0458  TSH 3.284    BNP Recent Labs  Lab 04/12/23 1730  BNP 135.0*    DDimer No results for input(s): "DDIMER" in the last 168 hours.   Radiology/Studies:  DG Chest Portable 1 View  Result Date: 04/12/2023 CLINICAL DATA:  Shortness of breath. EXAM: PORTABLE CHEST 1 VIEW COMPARISON:  February 02, 2023 FINDINGS: The heart size and mediastinal contours are within normal limits. Mild atelectasis is seen within the bilateral lung bases. Small bilateral pleural effusions are noted. No pneumothorax is identified. A radiopaque surgical pin is seen overlying the right humeral head. Multilevel degenerative changes seen throughout the thoracic spine. IMPRESSION: Mild bibasilar atelectasis with small bilateral pleural effusions. Electronically Signed   By: Aram Candela M.D.   On: 04/12/2023 21:29     Assessment and Plan:   1. Bilateral Leg Edema - Suspect this is secondary to hypoalbuminemia as albumin was less than 1.5 at the time of admission.  She reports her PO intake has been more limited since surgery which is the likely culprit. Agree with administration of albumin and an echocardiogram is pending to assess for any structural abnormalities. BNP was only mildly elevated at 135. - Would continue to monitor her edema with replacement of albumin. Once BP improves, can give Lasix but would hold on additional  diuretics for now given SBP in the 80's.   2. Persistent Hypotension - SBP was initially in the 70's but has now improved into the 80's. She has been started on Midodrine 5mg  TID. Continue to monitor. Does not require pressor support currently.   3. CAD - She had mild nonobstructive CAD by cardiac CTA in 11/2022. Denies any recent anginal symptoms and Hs Troponin values have been negative. Not on a statin prior to admission but would consider adding prior to discharge.   4. Dilated Ascending Aorta - This measured 39 mm by recent CT in 11/2022. Continue to follow as an outpatient.   For questions or updates, please contact [facility] Please consult [email or web address] for contact info under    Signed, Ellsworth Lennox, PA-C  04/13/2023 11:18 AM   Attending attestation  Patient is a 72 year old F known to have 39 mm dilated ascending aorta, nonobstructive CAD by CT cardiac in 2/24, history of gastric bypass surgery was sent from surgery clinic for evaluation of persistent hypotension. Patient underwent exploratory laparotomy with reduction of incarcerated hernia and primary repair of prior hernia with explant of mesh.  Since surgery, patient has been gradually losing appetite and having decreased p.o. intake of food/fluids.  She was told to take bland diet due to bowel wall thickening on the CT scan and  later encouraged to take regular diet if she can tolerate.  Patient has occasional dizziness, no syncope.  Has severe fatigue.  No chest pain or SOB.  No orthopnea, PND or leg swelling.  Physical examination is remarkable for patient not in acute respiratory distress, HEENT normal, no JVD, S1-S2 normal, clear lungs, abdomen NT and ND and 2+ pitting edema in lower extremities.  Labs remarkable for serum albumin less than 1.5.  BNP 135.  # Persistent hypotension likely secondary to decreased p.o. intake -Patient lost appetite since 04/24 resulting in decreased p.o. intake.  Encourage  appetite stimulants and increase p.o. intake.  Echocardiogram today showed normal LVEF and mild aortic valve regurgitation. Unlikely cardiac as no symptoms of DOE, orthopnea or PND.  # Bilateral lower EXTR swelling likely secondary to severe hypoalbuminemia -Serum albumin less than 1.5.  Patient had decreased p.o. intake since surgery in 4/24.  Encourage increased p.o. intake of protein.  Echocardiogram today showed normal LVEF and mild aortic valve regurgitation. Lower extremity swelling is unlikely cardiac as no symptoms of DOE, orthopnea or PND.  # Mild aortic valve regurgitation # Ascending aorta 39 mm dilatation -Outpatient surveillance  [facility] will sign off.   Medication Recommendations: Can continue midodrine but needs to be weaned off after patient starts to increase her p.o. intake. Other recommendations (labs, testing, etc): None Follow up as an outpatient: Cardiology outpatient follow-up, nonurgent.   Shakeema Lippman Verne Spurr, MD [address]  [facility]  1:02 PM

## 2023-04-13 NOTE — ED Notes (Signed)
Patient was with family member at bedside talking and laughing. No pain. Patient needed to go to the bathroom but blood pressure was low so I placed patient on purewick.

## 2023-04-13 NOTE — ED Notes (Signed)
ED TO INPATIENT HANDOFF REPORT  ED Nurse Name and Phone #:   S Name/Age/Gender Kayla Dean 72 y.o. female Room/Bed: APA18/APA18  Code Status   Code Status: Full Code   Triage Complete: Triage complete  Chief Complaint Peripheral edema [R60.0]  Triage Note Pt sent from Dr. Henreitta Leber' office for eval of hypotension 80/40 with SOB, recent bowel surgery April 19th. Pt now has swelling of bilateral lower legs and feet with weeping reported. No prior hx CHF   Allergies No Known Allergies  Level of Care/Admitting Diagnosis ED Disposition     ED Disposition  Admit   Condition  --   Comment  Hospital Area: [facility] [[id number]]  Level of Care: Stepdown [14]  Covid Evaluation: Asymptomatic - no recent exposure (last 10 days) testing not required  Diagnosis: Peripheral edema [218971]  Admitting Physician: Frankey Shown [[id number]]  Attending Physician: Frankey Shown [[id number]]  Certification:: I certify this patient will need inpatient services for at least 2 midnights  Estimated Length of Stay: 3          B Medical/Surgery History Past Medical History:  Diagnosis Date   Aortic aneurysm (HCC)    Arthritis    H/O gastric bypass    Hiatal hernia    Hypothyroidism    Small bowel obstruction (HCC)    Past Surgical History:  Procedure Laterality Date   ABDOMINAL HYSTERECTOMY     CHOLECYSTECTOMY     COLON SURGERY     HERNIA REPAIR     LAPAROTOMY N/A 02/02/2023   Procedure: EXPLORATORY LAPAROTOMY, [name], EXPLANT OF ABDOMINAL MESH;  Surgeon: Lucretia Roers, MD;  Location: [facility];  Service: General;  Laterality: N/A;   REPLACEMENT TOTAL KNEE Right    ROTATOR CUFF REPAIR Right    TOTAL HIP ARTHROPLASTY Right      A IV Location/Drains/Wounds Patient Lines/Drains/Airways Status     Active Line/Drains/Airways     Name Placement date Placement time Site Days   Peripheral IV 04/12/23 20 G Left Antecubital 04/12/23  2110   Antecubital  1   External Urinary Catheter 04/13/23  0816  --  less than 1            Intake/Output Last 24 hours  Intake/Output Summary (Last 24 hours) at 04/13/2023 1645 Last data filed at 04/13/2023 1500 Gross per 24 hour  Intake 80.2 ml  Output 650 ml  Net -569.8 ml    Labs/Imaging Results for orders placed or performed during the hospital encounter of 04/12/23 (from the past 48 hour(s))  Basic metabolic panel     Status: Abnormal   Collection Time: 04/12/23  5:30 PM  Result Value Ref Range   Sodium 136 135 - 145 mmol/L   Potassium 3.9 3.5 - 5.1 mmol/L   Chloride 98 98 - 111 mmol/L   CO2 29 22 - 32 mmol/L   Glucose, Bld 100 (H) 70 - 99 mg/dL    Comment: Glucose reference range applies only to samples taken after fasting for at least 8 hours.   BUN 14 8 - 23 mg/dL   Creatinine, Ser 1.91 0.44 - 1.00 mg/dL   Calcium 7.7 (L) 8.9 - 10.3 mg/dL   GFR, Estimated >47 >82 mL/min    Comment: (NOTE) Calculated using the CKD-EPI Creatinine Equation (2021)    Anion gap 9 5 - 15    Comment: Performed at [facility], [address], [place], [place] [address]  CBC     Status:  Abnormal   Collection Time: 04/12/23  5:30 PM  Result Value Ref Range   WBC 12.2 (H) 4.0 - 10.5 K/uL   RBC 4.19 3.87 - 5.11 MIL/uL   Hemoglobin 12.9 12.0 - 15.0 g/dL   HCT 16.1 09.6 - 04.5 %   MCV 96.2 80.0 - 100.0 fL   MCH 30.8 26.0 - 34.0 pg   MCHC 32.0 30.0 - 36.0 g/dL   RDW 40.9 (H) 81.1 - 91.4 %   Platelets 262 150 - 400 K/uL   nRBC 0.0 0.0 - 0.2 %    Comment: Performed at [facility], [address], [place], [place] [address]  Brain natriuretic peptide     Status: Abnormal   Collection Time: 04/12/23  5:30 PM  Result Value Ref Range   B Natriuretic Peptide 135.0 (H) 0.0 - 100.0 pg/mL    Comment: Performed at [facility], [address] [place], [place] [address]  Troponin I (High Sensitivity)     Status: None   Collection Time: 04/12/23  5:30 PM  Result Value Ref Range   Troponin I (High  Sensitivity) 4 <18 ng/L    Comment: (NOTE) Elevated high sensitivity troponin I (hsTnI) values and significant  changes across serial measurements may suggest ACS but many other  chronic and acute conditions are known to elevate hsTnI results.  Refer to the "Links" section for chest pain algorithms and additional  guidance. Performed at [facility], [address] [place], [place] [address]   Hepatic function panel     Status: Abnormal   Collection Time: 04/12/23  5:30 PM  Result Value Ref Range   Total Protein 4.9 (L) 6.5 - 8.1 g/dL   Albumin 1.5 (L) 3.5 - 5.0 g/dL   AST 19 15 - 41 U/L   ALT 29 0 - 44 U/L   Alkaline Phosphatase 129 (H) 38 - 126 U/L   Total Bilirubin 0.7 0.3 - 1.2 mg/dL   Bilirubin, Direct 0.2 0.0 - 0.2 mg/dL   Indirect Bilirubin 0.5 0.3 - 0.9 mg/dL    Comment: Performed at [facility], [address], [place], [place] [address]  Troponin I (High Sensitivity)     Status: None   Collection Time: 04/12/23  7:35 PM  Result Value Ref Range   Troponin I (High Sensitivity) 4 <18 ng/L    Comment: (NOTE) Elevated high sensitivity troponin I (hsTnI) values and significant  changes across serial measurements may suggest ACS but many other  chronic and acute conditions are known to elevate hsTnI results.  Refer to the "Links" section for chest pain algorithms and additional  guidance. Performed at [facility], [address] [place], [place] [address]   TSH     Status: None   Collection Time: 04/13/23  4:58 AM  Result Value Ref Range   TSH 3.284 0.350 - 4.500 uIU/mL    Comment: Performed by a 3rd Generation assay with a functional sensitivity of <=0.01 uIU/mL. Performed at [facility], [address] [place], [place] [address]   Comprehensive metabolic panel     Status: Abnormal   Collection Time: 04/13/23  4:58 AM  Result Value Ref Range   Sodium 135 135 - 145 mmol/L   Potassium 3.7 3.5 - 5.1 mmol/L   Chloride 102 98 - 111 mmol/L   CO2 28 22 - 32 mmol/L    Glucose, Bld 79 70 - 99 mg/dL    Comment: Glucose reference range applies only to samples taken after fasting for at  least 8 hours.   BUN 15 8 - 23 mg/dL   Creatinine, Ser 1.61 0.44 - 1.00 mg/dL   Calcium 6.8 (L) 8.9 - 10.3 mg/dL   Total Protein 3.6 (L) 6.5 - 8.1 g/dL   Albumin <0.9 (L) 3.5 - 5.0 g/dL   AST 13 (L) 15 - 41 U/L   ALT 19 0 - 44 U/L   Alkaline Phosphatase 86 38 - 126 U/L   Total Bilirubin 0.8 0.3 - 1.2 mg/dL   GFR, Estimated >60 >45 mL/min    Comment: (NOTE) Calculated using the CKD-EPI Creatinine Equation (2021)    Anion gap 5 5 - 15    Comment: Performed at [facility], [address], [place], [place] [address]  CBC     Status: Abnormal   Collection Time: 04/13/23  4:58 AM  Result Value Ref Range   WBC 7.4 4.0 - 10.5 K/uL   RBC 3.07 (L) 3.87 - 5.11 MIL/uL   Hemoglobin 9.6 (L) 12.0 - 15.0 g/dL    Comment: REPEATED TO VERIFY DELTA CHECK NOTED    HCT 29.9 (L) 36.0 - 46.0 %   MCV 97.4 80.0 - 100.0 fL   MCH 31.3 26.0 - 34.0 pg   MCHC 32.1 30.0 - 36.0 g/dL   RDW 19.1 (H) 47.8 - 29.5 %   Platelets 163 150 - 400 K/uL   nRBC 0.0 0.0 - 0.2 %    Comment: Performed at [facility], [address], [place], [place] [address]  Magnesium     Status: None   Collection Time: 04/13/23  4:58 AM  Result Value Ref Range   Magnesium 2.1 1.7 - 2.4 mg/dL    Comment: Performed at [facility], [address] [place], [place] [address]  Phosphorus     Status: None   Collection Time: 04/13/23  4:58 AM  Result Value Ref Range   Phosphorus 3.4 2.5 - 4.6 mg/dL    Comment: Performed at [facility], [address] [place], [place] [address]   CT ABDOMEN PELVIS W CONTRAST  Result Date: 04/13/2023 CLINICAL DATA:  Postop reduction of incarcerated hernia 02/02/2023; chronic mesenteric ischemia EXAM: CT ABDOMEN AND PELVIS WITH CONTRAST TECHNIQUE: Multidetector CT imaging of the abdomen and pelvis was performed using the standard protocol following bolus administration of intravenous  contrast. RADIATION DOSE REDUCTION: This exam was performed according to the departmental dose-optimization program which includes automated exposure control, adjustment of the mA and/or kV according to patient size and/or use of iterative reconstruction technique. CONTRAST:  85mL OMNIPAQUE IOHEXOL 300 MG/ML  SOLN COMPARISON:  CT abdomen and pelvis dated May 02/03/2023 FINDINGS: Lower chest: Pulmonary embolus of the right interlobar pulmonary artery and proximal segmental and subsegmental pulmonary arteries. Moderate hiatal hernia. Trace left-greater-than-right pleural effusions and bibasilar atelectasis. Hepatobiliary: Hepatic steatosis. Suspicious focal liver lesion. Prior cholecystectomy. Unchanged biliary ductal dilation. Pancreas: Unremarkable. No pancreatic ductal dilatation or surrounding inflammatory changes. Spleen: Normal in size without focal abnormality. Adrenals/Urinary Tract: Bilateral adrenal glands are unremarkable. No hydronephrosis or nephrolithiasis. Bladder is unremarkable. Stomach/Bowel: Prior gastric jejunostomy. Small hiatal hernia containing the gastric pouch. Multiple thick-walled loops small bowel between two jejunal-jejunal anastomotic suture lines. Associated mesenteric edema and prominent mesenteric lymph nodes, similar to prior exam. No evidence of obstruction. Vascular/Lymphatic: Aortic atherosclerosis. No enlarged abdominal or pelvic lymph nodes. Reproductive: Adnexal mass. Evaluation of the pelvis somewhat limited due to streak artifact from right hip arthroplasty. Other: Postsurgical changes of the anterior abdominal wall. Small locule of soft tissue gas  involving the right lower quadrant anterior abdominal wall, likely injection related. Musculoskeletal: Total right hip arthroplasty. Mild levocurvature of the lumbar spine. No aggressive appearing osseous lesions. IMPRESSION: 1. Pulmonary embolus of the right interlobar pulmonary artery and proximal segmental and subsegmental  pulmonary arteries. 2. Multiple thick-walled loops of small bowel between two anastomotic suture lines with associated mesenteric edema and prominent mesenteric lymph nodes, findings are similar to prior exam and consistent with history of chronic mesenteric ischemia. 3. Trace left-greater-than-right pleural effusions and bibasilar atelectasis. 4. Aortic Atherosclerosis (ICD10-I70.0). Critical Value/emergent results (pulmonary embolus) were called by telephone at the time of interpretation on 04/13/2023 at 3:08 pm to provider Dr. Mariea Clonts, who verbally acknowledged these results. Electronically Signed   By: Allegra Lai M.D.   On: 04/13/2023 15:11   ECHOCARDIOGRAM COMPLETE  Result Date: 04/13/2023    ECHOCARDIOGRAM REPORT   Patient Name:   Kayla Dean Date of Exam: 04/13/2023 Medical Rec #:  [id number]       Height:       65.0 in Accession #:    [id number]      Weight:       134.0 lb Date of Birth:  July 15, 1951       BSA:          1.669 m Patient Age:    71 years        BP:           73/56 mmHg Patient Gender: F               HR:           71 bpm. [facility] Procedure: 2D Echo, Cardiac Doppler and Color Doppler Indications:    CHF-Acute Diastolic I50.31  History:        Patient has prior history of Echocardiogram examinations, most                 recent 08/13/2018. Risk Factors:Family History of Coronary                 Artery Disease and Hypotension.  Sonographer:    Celesta Gentile RCS Referring Phys: [id number] OLADAPO ADEFESO IMPRESSIONS  1. Left ventricular ejection fraction, by estimation, is 60 to 65%. The left ventricle has normal function. The left ventricle has no regional wall motion abnormalities. Left ventricular diastolic parameters were normal.  2. Right ventricular systolic function is normal. The right ventricular size is normal.  3. Left atrial size was mildly dilated.  4. The mitral valve is abnormal. Trivial mitral valve regurgitation. No evidence of mitral stenosis.  5. The  aortic valve is tricuspid. There is moderate calcification of the aortic valve. There is moderate thickening of the aortic valve. Aortic valve regurgitation is mild. Aortic valve sclerosis is present, with no evidence of aortic valve stenosis.  6. The inferior vena cava is normal in size with greater than 50% respiratory variability, suggesting right atrial pressure of 3 mmHg. FINDINGS  Left Ventricle: Left ventricular ejection fraction, by estimation, is 60 to 65%. The left ventricle has normal function. The left ventricle has no regional wall motion abnormalities. The left ventricular internal cavity size was normal in size. There is  no left ventricular hypertrophy. Left ventricular diastolic parameters were normal. Right Ventricle: The right ventricular size is normal. No increase in right ventricular wall thickness. Right ventricular systolic function is normal. Left Atrium: Left atrial size was mildly dilated. Right Atrium: Right atrial size was normal in size. Pericardium:  There is no evidence of pericardial effusion. Mitral Valve: The mitral valve is abnormal. There is mild thickening of the mitral valve leaflet(s). Mild mitral annular calcification. Trivial mitral valve regurgitation. No evidence of mitral valve stenosis. Tricuspid Valve: The tricuspid valve is normal in structure. Tricuspid valve regurgitation is mild . No evidence of tricuspid stenosis. Aortic Valve: The aortic valve is tricuspid. There is moderate calcification of the aortic valve. There is moderate thickening of the aortic valve. Aortic valve regurgitation is mild. Aortic regurgitation PHT measures 778 msec. Aortic valve sclerosis is present, with no evidence of aortic valve stenosis. Aortic valve mean gradient measures 6.0 mmHg. Aortic valve peak gradient measures 14.7 mmHg. Aortic valve area, by VTI measures 1.92 cm. Pulmonic Valve: The pulmonic valve was normal in structure. Pulmonic valve regurgitation is not visualized. No  evidence of pulmonic stenosis. Aorta: The aortic root is normal in size and structure. Venous: The inferior vena cava is normal in size with greater than 50% respiratory variability, suggesting right atrial pressure of 3 mmHg. IAS/Shunts: No atrial level shunt detected by color flow Doppler.  LEFT VENTRICLE PLAX 2D LVIDd:         4.20 cm   Diastology LVIDs:         2.30 cm   LV e' medial:    6.85 cm/s LV PW:         1.00 cm   LV E/e' medial:  11.5 LV IVS:        0.90 cm   LV e' lateral:   9.57 cm/s LVOT diam:     2.00 cm   LV E/e' lateral: 8.2 LV SV:         73 LV SV Index:   43 LVOT Area:     3.14 cm  RIGHT VENTRICLE RV S prime:     17.40 cm/s TAPSE (M-mode): 2.1 cm LEFT ATRIUM             Index        RIGHT ATRIUM           Index LA diam:        3.80 cm 2.28 cm/m   RA Area:     16.90 cm LA Vol (A2C):   68.8 ml 41.23 ml/m  RA Volume:   44.20 ml  26.49 ml/m LA Vol (A4C):   38.3 ml 22.95 ml/m LA Biplane Vol: 50.9 ml 30.51 ml/m  AORTIC VALVE AV Area (Vmax):    1.72 cm AV Area (Vmean):   2.05 cm AV Area (VTI):     1.92 cm AV Vmax:           192.00 cm/s AV Vmean:          106.000 cm/s AV VTI:            0.377 m AV Peak Grad:      14.7 mmHg AV Mean Grad:      6.0 mmHg LVOT Vmax:         105.00 cm/s LVOT Vmean:        69.300 cm/s LVOT VTI:          0.231 m LVOT/AV VTI ratio: 0.61 AI PHT:            778 msec  AORTA Ao Root diam: 3.30 cm MITRAL VALVE               TRICUSPID VALVE MV Area (PHT): 2.45 cm    TR Peak grad:   30.5 mmHg MV  Decel Time: 310 msec    TR Vmax:        276.00 cm/s MV E velocity: 78.50 cm/s MV A velocity: 84.30 cm/s  SHUNTS MV E/A ratio:  0.93        Systemic VTI:  0.23 m                            Systemic Diam: 2.00 cm Charlton Haws MD Electronically signed by Charlton Haws MD Signature Date/Time: 04/13/2023/1:28:51 PM    Final    DG Chest Portable 1 View  Result Date: 04/12/2023 CLINICAL DATA:  Shortness of breath. EXAM: PORTABLE CHEST 1 VIEW COMPARISON:  February 02, 2023 FINDINGS: The heart  size and mediastinal contours are within normal limits. Mild atelectasis is seen within the bilateral lung bases. Small bilateral pleural effusions are noted. No pneumothorax is identified. A radiopaque surgical pin is seen overlying the right humeral head. Multilevel degenerative changes seen throughout the thoracic spine. IMPRESSION: Mild bibasilar atelectasis with small bilateral pleural effusions. Electronically Signed   By: Aram Candela M.D.   On: 04/12/2023 21:29    Pending Labs Unresulted Labs (From admission, onward)     Start     Ordered   04/15/23 0500  CBC  Daily,   R      04/13/23 1526   04/14/23 0500  Cortisol-am, blood  Tomorrow morning,   R       Question:  Specimen collection method  Answer:  Lab=Lab collect   04/13/23 0830   04/14/23 0500  Basic metabolic panel  Tomorrow morning,   R        04/13/23 0900   04/14/23 0500  CBC  Tomorrow morning,   R        04/13/23 0900   04/13/23 2300  Heparin level (unfractionated)  Once-Timed,   TIMED        04/13/23 1526            Vitals/Pain Today's Vitals   04/13/23 1330 04/13/23 1545 04/13/23 1600 04/13/23 1614  BP: (!) 91/59 91/61 (!) 83/62   Pulse: (!) 58 70 70   Resp: 14 18 13    Temp:    98.2 F (36.8 C)  TempSrc:    Oral  SpO2: 96% 95% 95%   Weight:      Height:      PainSc:        Isolation Precautions No active isolations  Medications Medications  acetaminophen (TYLENOL) tablet 650 mg (has no administration in time range)    Or  acetaminophen (TYLENOL) suppository 650 mg (has no administration in time range)  ondansetron (ZOFRAN) tablet 4 mg (has no administration in time range)    Or  ondansetron (ZOFRAN) injection 4 mg (has no administration in time range)  levothyroxine (SYNTHROID) tablet 137 mcg (137 mcg Oral Given 04/13/23 0613)  midodrine (PROAMATINE) tablet 5 mg (5 mg Oral Given 04/13/23 1250)  heparin ADULT infusion 100 units/mL (25000 units/249mL) (1,000 Units/hr Intravenous New Bag/Given  04/13/23 1610)  albumin human 25 % solution 25 g (0 g Intravenous Stopped 04/12/23 2200)  sodium chloride 0.9 % bolus 500 mL (0 mLs Intravenous Stopped 04/12/23 2148)  albumin human 25 % solution 12.5 g (0 g Intravenous Stopped 04/13/23 1055)  iohexol (OMNIPAQUE) 300 MG/ML solution 100 mL (85 mLs Intravenous Contrast Given 04/13/23 1401)  heparin bolus via infusion 3,000 Units (3,000 Units Intravenous Bolus from Bag 04/13/23 1611)    Mobility  Focused Assessments    R Recommendations: See Admitting Provider Note  Report given to:   Additional Notes:

## 2023-04-13 NOTE — Progress Notes (Addendum)
ANTICOAGULATION CONSULT NOTE - Follow Up Consult  Pharmacy Consult for heparin Indication: pulmonary embolus  No Known Allergies  Patient Measurements: Height: 5\' 5"  (165.1 cm) Weight: 62.5 kg (137 lb 12.6 oz) IBW/kg (Calculated) : 57 Heparin Dosing Weight: 60.8 kg  Vital Signs: Temp: 97.8 F (36.6 C) (06/28 2316) Temp Source: Oral (06/28 2316) BP: 66/45 (06/28 2300) Pulse Rate: 74 (06/28 2300)  Labs: Recent Labs    04/12/23 1730 04/12/23 1935 04/13/23 0458 04/13/23 2248  HGB 12.9  --  9.6*  --   HCT 40.3  --  29.9*  --   PLT 262  --  163  --   HEPARINUNFRC  --   --   --  0.38  CREATININE 0.70  --  0.65  --   TROPONINIHS 4 4  --   --      Estimated Creatinine Clearance: 57.2 mL/min (by C-G formula based on SCr of 0.65 mg/dL).   Medical History: Past Medical History:  Diagnosis Date   Aortic aneurysm (HCC)    Arthritis    H/O gastric bypass    Hiatal hernia    Hypothyroidism    Small bowel obstruction (HCC)     Medications:  Medications Prior to Admission  Medication Sig Dispense Refill Last Dose   acetaminophen (TYLENOL) 500 MG tablet Take 1,000 mg by mouth every 8 (eight) hours as needed for moderate pain.   Past Week   Calcium Carb-Cholecalciferol (CALCIUM 600 + D) 600-200 MG-UNIT TABS Take 1 tablet by mouth every morning.   Past Week   Cholecalciferol (VITAMIN D-3 PO) Take 1 capsule by mouth daily.   Past Week   ferrous sulfate 325 (65 FE) MG tablet Take 325 mg by mouth every morning.   Past Week   furosemide (LASIX) 80 MG tablet Take 1 tablet (80 mg total) by mouth daily. 90 tablet 3 Past Week   levothyroxine (SYNTHROID, LEVOTHROID) 137 MCG tablet Take 137 mcg by mouth daily before breakfast.   04/12/2023   Multiple Vitamin (MULTIVITAMIN WITH MINERALS) TABS tablet Take 1 tablet by mouth every morning.   04/12/2023   ondansetron (ZOFRAN-ODT) 4 MG disintegrating tablet Take 1 tablet (4 mg total) by mouth every 6 (six) hours as needed for nausea. 20 tablet 0  Past Week   oxyCODONE (OXY IR/ROXICODONE) 5 MG immediate release tablet Take 1 tablet (5 mg total) by mouth every 4 (four) hours as needed for severe pain or breakthrough pain. 15 tablet 0 Past Week   potassium chloride SA (KLOR-CON M) 20 MEQ tablet Take 1 tablet (20 mEq total) by mouth daily. 90 tablet 3 04/12/2023   zolpidem (AMBIEN) 5 MG tablet Take 1 tablet (5 mg total) by mouth at bedtime as needed for sleep. 30 tablet 0 Past Month    Assessment: Pharmacy consulted to dose heparin in patient with pulmonary embolism.  Patient is not on anticoagulation prior to admission but received lovenox 40 mg subcutaneous 6/28 @ 0613.  Hgb 9.6  Heparin level therapeutic at 0.38. Patient remains on room air.   Goal of Therapy:  Heparin level 0.3-0.7 units/ml Monitor platelets by anticoagulation protocol: Yes   Plan:  Continue heparin 1000u/hr.  Recheck heparin level with AM labs for confirmatory.   Estill Batten, PharmD, BCCCP  Clinical Pharmacist 04/13/2023 11:37 PM

## 2023-04-13 NOTE — TOC CM/SW Note (Signed)
Transition of Care [facility]) - Inpatient Brief Assessment   Patient Details  Name: Kayla Dean MRN: [id number] Date of Birth: Mar 07, 1951  Transition of [facility]) CM/SW Contact:    Elliot Gault, LCSW Phone Number: 04/13/2023, 11:01 AM   Clinical Narrative:  Transition of Care Department [facility]) has reviewed patient and no TOC needs have been identified at this time. We will continue to monitor patient advancement through interdisciplinary progression rounds. If new patient transition needs arise, please place a TOC consult.  Transition of Care Asessment: Insurance and Status: Insurance coverage has been reviewed Patient has primary care physician: Yes Home environment has been reviewed: from home Prior level of function:: independent Prior/Current Home Services: No current home services Social Determinants of Health Reivew: SDOH reviewed no interventions necessary Readmission risk has been reviewed: Yes Transition of care needs: no transition of care needs at this time

## 2023-04-13 NOTE — Progress Notes (Signed)
*  PRELIMINARY RESULTS* Echocardiogram 2D Echocardiogram has been performed.  Stacey Drain 04/13/2023, 12:28 PM

## 2023-04-13 NOTE — Progress Notes (Signed)
PROGRESS NOTE     Kayla Dean, is a 72 y.o. female, DOB - 17-Nov-1950, OZH:[id number]  Admit date - 04/12/2023   Admitting Physician Frankey Shown, DO  Outpatient Primary MD for the patient is [facility]  LOS - 1  Chief Complaint  Patient presents with   Hypotension        Brief Narrative:  72 y.o. female with medical history significant of thyroidism, aortic aneurysm, hiatal hernia, history of gastric bypass  , s/p exploratory laparotomy, reduction of incarcerated hernia and primary hernia repair on 02/02/2023 who was admitted on 04/12/2023 from general surgery office after being found on dyspnea on exertion and lower extremity edema, CT abdomen and pelvis incidentally shows right-sided pulmonary embolism,   -Assessment and Plan: 1)Rt Sided Pulm Embolism--- May be contributing to hypotension -Echo with EF of 60 to 65%, no regional wall motion abnormalities, diastolic parameters are WNL -No aortic stenosis, no mitral stenosis -CT findings and echo findings not consistent with right heart strain- -Fatigue and dyspnea on exertion persist -Orthostatic dizziness persist -BNP 135 -Give IV heparin, if no bleeding consider transition to DOAC over the next 24 to 48 hours  2)Persistent hypotension--- multifactorial -#1 above is contributory -Patient also has hypoalbuminemia with third spacing/generalized edema -Cardiology consult appreciated -Echocardiogram as noted above -IV Albulmin -Po Midodrine -Check a.m. cortisol  3)Third spacing/Generalized edema--- albumin is 1.5, total protein 4.9 -Nutritional supplements as ordered --IV Albulmin -Hold off on diuretics  4)Hypothyroidism---TSH 3.28 -Continue levothyroxine  5)Abd Pain--CT abdomen and pelvis shows previously established mesenteric edematous bowel  -No additional new findings -Please see notes from Dr. Henreitta Leber and Dr. Lovell Sheehan from general surgery teams  Status is: Inpatient   Disposition:  The patient is from: Home              Anticipated d/c is to: Home              Anticipated d/c date is: 2 days              Patient currently is not medically stable to d/c. Barriers: Not Clinically Stable-   Code Status :  -  Code Status: Full Code   Family Communication:    NA (patient is alert, awake and coherent)   DVT Prophylaxis  :   - SCDs   Place TED hose Start: 04/13/23 0859 SCDs Start: 04/13/23 0447   Lab Results  Component Value Date   PLT 163 04/13/2023   Inpatient Medications  Scheduled Meds:  [START ON 04/14/2023] (feeding supplement) PROSource Plus  30 mL Oral BID BM   Chlorhexidine Gluconate Cloth  6 each Topical Daily   feeding supplement  1 Container Oral TID BM   levothyroxine  137 mcg Oral Q0600   midodrine  5 mg Oral TID WC   Continuous Infusions:  heparin 1,000 Units/hr (04/13/23 1800)   PRN Meds:.acetaminophen **OR** acetaminophen, ondansetron **OR** ondansetron (ZOFRAN) IV   Anti-infectives (From admission, onward)    None      Subjective: Kayla Dean today has no fevers, no emesis,  No chest pain,    Fatigue and dyspnea on exertion persist -Orthostatic dizziness persist  Objective: Vitals:   04/13/23 1600 04/13/23 1614 04/13/23 1630 04/13/23 1707  BP: (!) 83/62 98/68 92/68  106/62  Pulse: 70 61 (!) 57 63  Resp: 13 14 16 16   Temp:  98.2 F (36.8 C)  97.8 F (36.6 C)  TempSrc:  Oral  Oral  SpO2: 95% 95% 94% 95%  Weight:    62.5 kg  Height:    5\' 5"  (1.651 m)    Intake/Output Summary (Last 24 hours) at 04/13/2023 1818 Last data filed at 04/13/2023 1800 Gross per 24 hour  Intake 96.71 ml  Output 650 ml  Net -553.29 ml   Filed Weights   04/12/23 1711 04/13/23 1707  Weight: 60.8 kg 62.5 kg    Physical Exam  Gen:- Awake Alert,  in no apparent distress  HEENT:- [place].AT, No sclera icterus Neck-Supple Neck,No JVD,.  Lungs-  CTAB , fair symmetrical air movement CV- S1, S2 normal, regular  Abd-  +ve B.Sounds, Abd Soft, No  tenderness,    Extremity/Skin:- +2 pitting edema, pedal pulses present  Psych-affect is appropriate, oriented x3 Neuro-no new focal deficits, no tremors  Data Reviewed: I have personally reviewed following labs and imaging studies  CBC: Recent Labs  Lab 04/12/23 1730 04/13/23 0458  WBC 12.2* 7.4  HGB 12.9 9.6*  HCT 40.3 29.9*  MCV 96.2 97.4  PLT 262 163   Basic Metabolic Panel: Recent Labs  Lab 04/10/23 1248 04/12/23 1730 04/13/23 0458  NA 134* 136 135  K 3.5 3.9 3.7  CL 100 98 102  CO2 28 29 28   GLUCOSE 95 100* 79  BUN 14 14 15   CREATININE 0.68 0.70 0.65  CALCIUM 7.1* 7.7* 6.8*  MG  --   --  2.1  PHOS  --   --  3.4   GFR: Estimated Creatinine Clearance: 57.2 mL/min (by C-G formula based on SCr of 0.65 mg/dL). Liver Function Tests: Recent Labs  Lab 04/12/23 1730 04/13/23 0458  AST 19 13*  ALT 29 19  ALKPHOS 129* 86  BILITOT 0.7 0.8  PROT 4.9* 3.6*  ALBUMIN 1.5* <1.5*   Radiology Studies: CT ABDOMEN PELVIS W CONTRAST  Result Date: 04/13/2023 CLINICAL DATA:  Postop reduction of incarcerated hernia 02/02/2023; chronic mesenteric ischemia EXAM: CT ABDOMEN AND PELVIS WITH CONTRAST TECHNIQUE: Multidetector CT imaging of the abdomen and pelvis was performed using the standard protocol following bolus administration of intravenous contrast. RADIATION DOSE REDUCTION: This exam was performed according to the departmental dose-optimization program which includes automated exposure control, adjustment of the mA and/or kV according to patient size and/or use of iterative reconstruction technique. CONTRAST:  85mL OMNIPAQUE IOHEXOL 300 MG/ML  SOLN COMPARISON:  CT abdomen and pelvis dated May 02/03/2023 FINDINGS: Lower chest: Pulmonary embolus of the right interlobar pulmonary artery and proximal segmental and subsegmental pulmonary arteries. Moderate hiatal hernia. Trace left-greater-than-right pleural effusions and bibasilar atelectasis. Hepatobiliary: Hepatic steatosis.  Suspicious focal liver lesion. Prior cholecystectomy. Unchanged biliary ductal dilation. Pancreas: Unremarkable. No pancreatic ductal dilatation or surrounding inflammatory changes. Spleen: Normal in size without focal abnormality. Adrenals/Urinary Tract: Bilateral adrenal glands are unremarkable. No hydronephrosis or nephrolithiasis. Bladder is unremarkable. Stomach/Bowel: Prior gastric jejunostomy. Small hiatal hernia containing the gastric pouch. Multiple thick-walled loops small bowel between two jejunal-jejunal anastomotic suture lines. Associated mesenteric edema and prominent mesenteric lymph nodes, similar to prior exam. No evidence of obstruction. Vascular/Lymphatic: Aortic atherosclerosis. No enlarged abdominal or pelvic lymph nodes. Reproductive: Adnexal mass. Evaluation of the pelvis somewhat limited due to streak artifact from right hip arthroplasty. Other: Postsurgical changes of the anterior abdominal wall. Small locule of soft tissue gas involving the right lower quadrant anterior abdominal wall, likely injection related. Musculoskeletal: Total right hip arthroplasty. Mild levocurvature of the lumbar spine. No aggressive appearing osseous lesions. IMPRESSION: 1. Pulmonary embolus of the right interlobar pulmonary artery and proximal segmental and subsegmental pulmonary  arteries. 2. Multiple thick-walled loops of small bowel between two anastomotic suture lines with associated mesenteric edema and prominent mesenteric lymph nodes, findings are similar to prior exam and consistent with history of chronic mesenteric ischemia. 3. Trace left-greater-than-right pleural effusions and bibasilar atelectasis. 4. Aortic Atherosclerosis (ICD10-I70.0). Critical Value/emergent results (pulmonary embolus) were called by telephone at the time of interpretation on 04/13/2023 at 3:08 pm to provider Dr. Mariea Clonts, who verbally acknowledged these results. Electronically Signed   By: Allegra Lai M.D.   On: 04/13/2023  15:11   ECHOCARDIOGRAM COMPLETE  Result Date: 04/13/2023    ECHOCARDIOGRAM REPORT   Patient Name:   Kayla Dean Date of Exam: 04/13/2023 Medical Rec #:  [id number]       Height:       65.0 in Accession #:    [id number]      Weight:       134.0 lb Date of Birth:  04-Mar-1951       BSA:          1.669 m Patient Age:    71 years        BP:           73/56 mmHg Patient Gender: F               HR:           71 bpm. [facility] Procedure: 2D Echo, Cardiac Doppler and Color Doppler Indications:    CHF-Acute Diastolic I50.31  History:        Patient has prior history of Echocardiogram examinations, most                 recent 08/13/2018. Risk Factors:Family History of Coronary                 Artery Disease and Hypotension.  Sonographer:    Celesta Gentile RCS Referring Phys: [id number] OLADAPO ADEFESO IMPRESSIONS  1. Left ventricular ejection fraction, by estimation, is 60 to 65%. The left ventricle has normal function. The left ventricle has no regional wall motion abnormalities. Left ventricular diastolic parameters were normal.  2. Right ventricular systolic function is normal. The right ventricular size is normal.  3. Left atrial size was mildly dilated.  4. The mitral valve is abnormal. Trivial mitral valve regurgitation. No evidence of mitral stenosis.  5. The aortic valve is tricuspid. There is moderate calcification of the aortic valve. There is moderate thickening of the aortic valve. Aortic valve regurgitation is mild. Aortic valve sclerosis is present, with no evidence of aortic valve stenosis.  6. The inferior vena cava is normal in size with greater than 50% respiratory variability, suggesting right atrial pressure of 3 mmHg. FINDINGS  Left Ventricle: Left ventricular ejection fraction, by estimation, is 60 to 65%. The left ventricle has normal function. The left ventricle has no regional wall motion abnormalities. The left ventricular internal cavity size was normal in size. There is  no left  ventricular hypertrophy. Left ventricular diastolic parameters were normal. Right Ventricle: The right ventricular size is normal. No increase in right ventricular wall thickness. Right ventricular systolic function is normal. Left Atrium: Left atrial size was mildly dilated. Right Atrium: Right atrial size was normal in size. Pericardium: There is no evidence of pericardial effusion. Mitral Valve: The mitral valve is abnormal. There is mild thickening of the mitral valve leaflet(s). Mild mitral annular calcification. Trivial mitral valve regurgitation. No evidence of mitral valve stenosis. Tricuspid Valve: The tricuspid valve is  normal in structure. Tricuspid valve regurgitation is mild . No evidence of tricuspid stenosis. Aortic Valve: The aortic valve is tricuspid. There is moderate calcification of the aortic valve. There is moderate thickening of the aortic valve. Aortic valve regurgitation is mild. Aortic regurgitation PHT measures 778 msec. Aortic valve sclerosis is present, with no evidence of aortic valve stenosis. Aortic valve mean gradient measures 6.0 mmHg. Aortic valve peak gradient measures 14.7 mmHg. Aortic valve area, by VTI measures 1.92 cm. Pulmonic Valve: The pulmonic valve was normal in structure. Pulmonic valve regurgitation is not visualized. No evidence of pulmonic stenosis. Aorta: The aortic root is normal in size and structure. Venous: The inferior vena cava is normal in size with greater than 50% respiratory variability, suggesting right atrial pressure of 3 mmHg. IAS/Shunts: No atrial level shunt detected by color flow Doppler.  LEFT VENTRICLE PLAX 2D LVIDd:         4.20 cm   Diastology LVIDs:         2.30 cm   LV e' medial:    6.85 cm/s LV PW:         1.00 cm   LV E/e' medial:  11.5 LV IVS:        0.90 cm   LV e' lateral:   9.57 cm/s LVOT diam:     2.00 cm   LV E/e' lateral: 8.2 LV SV:         73 LV SV Index:   43 LVOT Area:     3.14 cm  RIGHT VENTRICLE RV S prime:     17.40 cm/s TAPSE  (M-mode): 2.1 cm LEFT ATRIUM             Index        RIGHT ATRIUM           Index LA diam:        3.80 cm 2.28 cm/m   RA Area:     16.90 cm LA Vol (A2C):   68.8 ml 41.23 ml/m  RA Volume:   44.20 ml  26.49 ml/m LA Vol (A4C):   38.3 ml 22.95 ml/m LA Biplane Vol: 50.9 ml 30.51 ml/m  AORTIC VALVE AV Area (Vmax):    1.72 cm AV Area (Vmean):   2.05 cm AV Area (VTI):     1.92 cm AV Vmax:           192.00 cm/s AV Vmean:          106.000 cm/s AV VTI:            0.377 m AV Peak Grad:      14.7 mmHg AV Mean Grad:      6.0 mmHg LVOT Vmax:         105.00 cm/s LVOT Vmean:        69.300 cm/s LVOT VTI:          0.231 m LVOT/AV VTI ratio: 0.61 AI PHT:            778 msec  AORTA Ao Root diam: 3.30 cm MITRAL VALVE               TRICUSPID VALVE MV Area (PHT): 2.45 cm    TR Peak grad:   30.5 mmHg MV Decel Time: 310 msec    TR Vmax:        276.00 cm/s MV E velocity: 78.50 cm/s MV A velocity: 84.30 cm/s  SHUNTS MV E/A ratio:  0.93        Systemic  VTI:  0.23 m                            Systemic Diam: 2.00 cm Charlton Haws MD Electronically signed by Charlton Haws MD Signature Date/Time: 04/13/2023/1:28:51 PM    Final    DG Chest Portable 1 View  Result Date: 04/12/2023 CLINICAL DATA:  Shortness of breath. EXAM: PORTABLE CHEST 1 VIEW COMPARISON:  February 02, 2023 FINDINGS: The heart size and mediastinal contours are within normal limits. Mild atelectasis is seen within the bilateral lung bases. Small bilateral pleural effusions are noted. No pneumothorax is identified. A radiopaque surgical pin is seen overlying the right humeral head. Multilevel degenerative changes seen throughout the thoracic spine. IMPRESSION: Mild bibasilar atelectasis with small bilateral pleural effusions. Electronically Signed   By: Aram Candela M.D.   On: 04/12/2023 21:29     Scheduled Meds:  [START ON 04/14/2023] (feeding supplement) PROSource Plus  30 mL Oral BID BM   Chlorhexidine Gluconate Cloth  6 each Topical Daily   feeding supplement  1  Container Oral TID BM   levothyroxine  137 mcg Oral Q0600   midodrine  5 mg Oral TID WC   Continuous Infusions:  heparin 1,000 Units/hr (04/13/23 1800)     LOS: 1 day    Shon Hale M.D on 04/13/2023 at 6:18 PM  Go to [email or web address] - for contact info  [facility] - Office  [phone]  If 7PM-7AM, please contact night-coverage [email or web address] 04/13/2023, 6:18 PM

## 2023-04-14 DIAGNOSIS — E8809 Other disorders of plasma-protein metabolism, not elsewhere classified: Secondary | ICD-10-CM | POA: Diagnosis not present

## 2023-04-14 DIAGNOSIS — E46 Unspecified protein-calorie malnutrition: Secondary | ICD-10-CM | POA: Diagnosis not present

## 2023-04-14 DIAGNOSIS — I2699 Other pulmonary embolism without acute cor pulmonale: Secondary | ICD-10-CM

## 2023-04-14 DIAGNOSIS — I959 Hypotension, unspecified: Secondary | ICD-10-CM | POA: Diagnosis not present

## 2023-04-14 LAB — COMPREHENSIVE METABOLIC PANEL
ALT: 17 U/L (ref 0–44)
AST: 15 U/L (ref 15–41)
Albumin: <1.5 g/dL — ABNORMAL LOW (ref 3.5–5.0)
Alkaline Phosphatase: 84 U/L (ref 38–126)
Anion gap: 6 (ref 5–15)
BUN: 9 mg/dL (ref 8–23)
CO2: 27 mmol/L (ref 22–32)
Calcium: 6.7 mg/dL — ABNORMAL LOW (ref 8.9–10.3)
Chloride: 101 mmol/L (ref 98–111)
Creatinine, Ser: 0.55 mg/dL (ref 0.44–1.00)
GFR, Estimated: >60 mL/min (ref >60)
Glucose, Bld: 89 mg/dL (ref 70–99)
Potassium: 3.3 mmol/L — ABNORMAL LOW (ref 3.5–5.1)
Sodium: 134 mmol/L — ABNORMAL LOW (ref 135–145)
Total Bilirubin: 0.6 mg/dL (ref 0.3–1.2)
Total Protein: 3.6 g/dL — ABNORMAL LOW (ref 6.5–8.1)

## 2023-04-14 LAB — HEPARIN LEVEL (UNFRACTIONATED): Heparin Unfractionated: 0.37 IU/mL (ref 0.30–0.70)

## 2023-04-14 LAB — CBC
HCT: 30.2 % — ABNORMAL LOW (ref 36.0–46.0)
Hemoglobin: 9.9 g/dL — ABNORMAL LOW (ref 12.0–15.0)
MCH: 31.2 pg (ref 26.0–34.0)
MCHC: 32.8 g/dL (ref 30.0–36.0)
MCV: 95.3 fL (ref 80.0–100.0)
Platelets: 205 10*3/uL (ref 150–400)
RBC: 3.17 MIL/uL — ABNORMAL LOW (ref 3.87–5.11)
RDW: 18.6 % — ABNORMAL HIGH (ref 11.5–15.5)
WBC: 10.1 10*3/uL (ref 4.0–10.5)
nRBC: 0 % (ref 0.0–0.2)

## 2023-04-14 LAB — CORTISOL-AM, BLOOD: Cortisol - AM: 12.5 ug/dL (ref 6.7–22.6)

## 2023-04-14 MED ORDER — MIDODRINE HCL 5 MG PO TABS
10.0000 mg | ORAL_TABLET | Freq: Once | ORAL | Status: AC
  Administered 2023-04-14: 10 mg via ORAL
  Filled 2023-04-14: qty 2

## 2023-04-14 MED ORDER — VASOPRESSIN 20 UNITS/100 ML INFUSION FOR SHOCK
0.0000 [IU]/min | INTRAVENOUS | Status: DC
  Administered 2023-04-14: 0.03 [IU]/min via INTRAVENOUS
  Administered 2023-04-15: 0.02 [IU]/min via INTRAVENOUS
  Administered 2023-04-15 – 2023-04-17 (×5): 0.03 [IU]/min via INTRAVENOUS
  Filled 2023-04-14 (×9): qty 100

## 2023-04-14 MED ORDER — POTASSIUM CHLORIDE CRYS ER 20 MEQ PO TBCR
40.0000 meq | EXTENDED_RELEASE_TABLET | ORAL | Status: AC
  Administered 2023-04-14 (×2): 40 meq via ORAL
  Filled 2023-04-14 (×2): qty 2

## 2023-04-14 MED ORDER — NOREPINEPHRINE 4 MG/250ML-% IV SOLN
0.0000 ug/min | INTRAVENOUS | Status: DC
  Administered 2023-04-14: 10 ug/min via INTRAVENOUS
  Administered 2023-04-14: 2 ug/min via INTRAVENOUS
  Administered 2023-04-15 (×2): 10 ug/min via INTRAVENOUS
  Administered 2023-04-15: 3 ug/min via INTRAVENOUS
  Administered 2023-04-18: 4 ug/min via INTRAVENOUS
  Administered 2023-04-20: 7 ug/min via INTRAVENOUS
  Administered 2023-04-21: 2 ug/min via INTRAVENOUS
  Administered 2023-04-21: 3 ug/min via INTRAVENOUS
  Filled 2023-04-14 (×11): qty 250

## 2023-04-14 MED ORDER — SODIUM CHLORIDE 0.9 % IV SOLN: 250.0000 mL | INTRAVENOUS | Status: DC

## 2023-04-14 NOTE — Progress Notes (Signed)
Pt hypotensive, asymptomatic, manual BP 82/50, notified Dr. Thomes Dinning no new orders, uop so far this shift .

## 2023-04-14 NOTE — Progress Notes (Signed)
Pt BP continues to be low, spoke to Dr. Thomes Dinning will give 10mg  Proamatine now once have lab draw CMP, depending on albumin lvl may give albumin.

## 2023-04-14 NOTE — Progress Notes (Signed)
ANTICOAGULATION CONSULT NOTE -   Pharmacy Consult for heparin Indication: pulmonary embolus  No Known Allergies  Patient Measurements: Height: 5\' 5"  (165.1 cm) Weight: 58 kg (127 lb 13.9 oz) IBW/kg (Calculated) : 57 Heparin Dosing Weight: 60.8 kg  Vital Signs: Temp: 98 F (36.7 C) (06/29 0348) Temp Source: Oral (06/29 0348) BP: 59/47 (06/29 0800) Pulse Rate: 58 (06/29 0800)  Labs: Recent Labs    04/12/23 1730 04/12/23 1935 04/13/23 0458 04/13/23 2248 04/14/23 0421 04/14/23 0810  HGB 12.9  --  9.6*  --  9.9*  --   HCT 40.3  --  29.9*  --  30.2*  --   PLT 262  --  163  --  205  --   HEPARINUNFRC  --   --   --  0.38  --  0.37  CREATININE 0.70  --  0.65  --  0.55  --   TROPONINIHS 4 4  --   --   --   --      Estimated Creatinine Clearance: 57.2 mL/min (by C-G formula based on SCr of 0.55 mg/dL).   Medical History: Past Medical History:  Diagnosis Date   Aortic aneurysm (HCC)    Arthritis    H/O gastric bypass    Hiatal hernia    Hypothyroidism    Small bowel obstruction (HCC)     Medications:  Medications Prior to Admission  Medication Sig Dispense Refill Last Dose   acetaminophen (TYLENOL) 500 MG tablet Take 1,000 mg by mouth every 8 (eight) hours as needed for moderate pain.   Past Week   Calcium Carb-Cholecalciferol (CALCIUM 600 + D) 600-200 MG-UNIT TABS Take 1 tablet by mouth every morning.   Past Week   Cholecalciferol (VITAMIN D-3 PO) Take 1 capsule by mouth daily.   Past Week   ferrous sulfate 325 (65 FE) MG tablet Take 325 mg by mouth every morning.   Past Week   furosemide (LASIX) 80 MG tablet Take 1 tablet (80 mg total) by mouth daily. 90 tablet 3 Past Week   levothyroxine (SYNTHROID, LEVOTHROID) 137 MCG tablet Take 137 mcg by mouth daily before breakfast.   04/12/2023   Multiple Vitamin (MULTIVITAMIN WITH MINERALS) TABS tablet Take 1 tablet by mouth every morning.   04/12/2023   ondansetron (ZOFRAN-ODT) 4 MG disintegrating tablet Take 1 tablet (4 mg  total) by mouth every 6 (six) hours as needed for nausea. 20 tablet 0 Past Week   oxyCODONE (OXY IR/ROXICODONE) 5 MG immediate release tablet Take 1 tablet (5 mg total) by mouth every 4 (four) hours as needed for severe pain or breakthrough pain. 15 tablet 0 Past Week   potassium chloride SA (KLOR-CON M) 20 MEQ tablet Take 1 tablet (20 mEq total) by mouth daily. 90 tablet 3 04/12/2023   zolpidem (AMBIEN) 5 MG tablet Take 1 tablet (5 mg total) by mouth at bedtime as needed for sleep. 30 tablet 0 Past Month    Assessment: Pharmacy consulted to dose heparin in patient with pulmonary embolism.  Patient is not on anticoagulation prior to admission but received lovenox 40 mg subcutaneous 6/28 @ 0613.  HL 0.37- therapeutic Hgb 9.9  Goal of Therapy:  Heparin level 0.3-0.7 units/ml Monitor platelets by anticoagulation protocol: Yes   Plan:  Continue heparin infusion at 1000 units/hr Check anti-Xa level daily Continue to monitor H&H and platelets   Judeth Cornfield, PharmD Clinical Pharmacist 04/14/2023 8:59 AM

## 2023-04-14 NOTE — Progress Notes (Addendum)
PROGRESS NOTE     Renetha Vero, is a 72 y.o. female, DOB - Jan 01, 1951, ZOX:[id number]  Admit date - 04/12/2023   Admitting Physician Frankey Shown, DO  Outpatient Primary MD for the patient is [facility]  LOS - 2  Chief Complaint  Patient presents with   Hypotension       Brief Narrative:  72 y.o. female with medical history significant of thyroidism, aortic aneurysm, hiatal hernia, history of gastric bypass  , s/p exploratory laparotomy, reduction of incarcerated hernia and primary hernia repair on 02/02/2023 who was admitted on 04/12/2023 from general surgery office after being found on dyspnea on exertion and lower extremity edema, CT abdomen and pelvis incidentally shows right-sided pulmonary embolism,   -Assessment and Plan: 1)Rt Sided Pulm Embolism--- May be contributing to hypotension -Echo with EF of 60 to 65%, no regional wall motion abnormalities, diastolic parameters are WNL -No aortic stenosis, no mitral stenosis -CT findings and echo findings not consistent with right heart strain- -Fatigue and dyspnea on exertion persist -Orthostatic dizziness persist -BNP 135 04/14/23 -Hypotension and dizziness and shortness of breath persist -c/n  IV heparin,  -Hold off on transitioning to DOAC due to hemodynamic instability at this time   2)Persistent hypotension--- multifactorial -#1 above is contributory -Patient also has hypoalbuminemia with third spacing/generalized edema -Cardiology consult appreciated -Echocardiogram as noted above -IV Albulmin -Po Midodrine - a.m. cortisol is 12.5 (Not Low) -Start IV Levophed for pressure support  3)Third spacing/Generalized edema--- albumin is 1.5, total protein 4.9 -Nutritional supplements as ordered --IV Albulmin -Hold off on diuretics  4)Hypothyroidism---TSH 3.28 -Continue levothyroxine  5)Abd Pain--CT abdomen and pelvis shows previously established mesenteric edematous bowel  -No additional  new findings -Please see notes from Dr. Henreitta Leber and Dr. Lovell Sheehan from general surgery teams  CRITICAL CARE Performed by: Shon Hale   Total critical care time: 53 minutes  Critical care time was exclusive of separately billable procedures and treating other patients. Persistent hypotension in the setting of volume overload requiring IV Levophed for pressure support Critical care was necessary to treat or prevent imminent or life-threatening deterioration.  Critical care was time spent personally by me on the following activities: development of treatment plan with patient and/or surrogate as well as nursing, discussions with consultants, evaluation of patient's response to treatment, examination of patient, obtaining history from patient or surrogate, ordering and performing treatments and interventions, ordering and review of laboratory studies, ordering and review of radiographic studies, pulse oximetry and re-evaluation of patient's condition.   Status is: Inpatient   Disposition: The patient is from: Home              Anticipated d/c is to: Home              Anticipated d/c date is: 2 days              Patient currently is not medically stable to d/c. Barriers: Not Clinically Stable-   Code Status :  -  Code Status: Full Code   Family Communication:    NA (patient is alert, awake and coherent)   DVT Prophylaxis  :   - SCDs   Place TED hose Start: 04/13/23 0859 SCDs Start: 04/13/23 0447   Lab Results  Component Value Date   PLT 205 04/14/2023   Inpatient Medications  Scheduled Meds:  (feeding supplement) PROSource Plus  30 mL Oral BID BM   Chlorhexidine Gluconate Cloth  6 each Topical Daily   feeding supplement  1 Container Oral TID BM   levothyroxine  137 mcg Oral Q0600   midodrine  10 mg Oral TID WC   Continuous Infusions:  sodium chloride Stopped (04/14/23 0944)   heparin 1,000 Units/hr (04/14/23 1834)   norepinephrine (LEVOPHED) Adult infusion 6 mcg/min  (04/14/23 1721)   PRN Meds:.acetaminophen **OR** acetaminophen, ondansetron **OR** ondansetron (ZOFRAN) IV   Anti-infectives (From admission, onward)    None      Subjective: Monica Martinez today has no fevers, no emesis,  No chest pain,    Fatigue and dyspnea on exertion persist -Orthostatic dizziness persist -hypotension persist--BP is 71/46 mmhg (06/29 1800)  Objective: Vitals:   04/14/23 1631 04/14/23 1700 04/14/23 1730 04/14/23 1800  BP:  (!) 81/55 (!) 75/51 (!) 71/46  Pulse:  68 64 (!) 58  Resp:  18 16 17   Temp: 97.7 F (36.5 C)     TempSrc: Oral     SpO2:  94% 95% 94%  Weight:      Height:        Intake/Output Summary (Last 24 hours) at 04/14/2023 1929 Last data filed at 04/14/2023 1721 Gross per 24 hour  Intake 596.99 ml  Output 1500 ml  Net -903.01 ml   Filed Weights   04/12/23 1711 04/13/23 1707 04/14/23 0348  Weight: 60.8 kg 62.5 kg 58 kg   Temp:  [97.7 F (36.5 C)-98.3 F (36.8 C)] 97.7 F (36.5 C) (06/29 1631) Pulse Rate:  [51-87] 58 (06/29 1800) Resp:  [12-23] 17 (06/29 1800) BP: (59-106)/(40-64) 71/46 (06/29 1800) SpO2:  [91 %-96 %] 94 % (06/29 1800) Weight:  [58 kg] 58 kg (06/29 0348)   Physical Exam Gen:- Awake Alert,  in no apparent distress  HEENT:- .AT, No sclera icterus Neck-Supple Neck,No JVD,.  Lungs-  CTAB , fair symmetrical air movement CV- S1, S2 normal, regular  Abd-  +ve B.Sounds, Abd Soft, No tenderness,    Extremity/Skin:- +2 pitting edema, pedal pulses present  Psych-affect is appropriate, oriented x3 Neuro-no new focal deficits, no tremors  Data Reviewed: I have personally reviewed following labs and imaging studies  CBC: Recent Labs  Lab 04/12/23 1730 04/13/23 0458 04/14/23 0421  WBC 12.2* 7.4 10.1  HGB 12.9 9.6* 9.9*  HCT 40.3 29.9* 30.2*  MCV 96.2 97.4 95.3  PLT 262 163 205   Basic Metabolic Panel: Recent Labs  Lab 04/10/23 1248 04/12/23 1730 04/13/23 0458 04/14/23 0421  NA 134* 136 135 134*  K  3.5 3.9 3.7 3.3*  CL 100 98 102 101  CO2 28 29 28 27   GLUCOSE 95 100* 79 89  BUN 14 14 15 9   CREATININE 0.68 0.70 0.65 0.55  CALCIUM 7.1* 7.7* 6.8* 6.7*  MG  --   --  2.1  --   PHOS  --   --  3.4  --    GFR: Estimated Creatinine Clearance: 57.2 mL/min (by C-G formula based on SCr of 0.55 mg/dL). Liver Function Tests: Recent Labs  Lab 04/12/23 1730 04/13/23 0458 04/14/23 0421  AST 19 13* 15  ALT 29 19 17   ALKPHOS 129* 86 84  BILITOT 0.7 0.8 0.6  PROT 4.9* 3.6* 3.6*  ALBUMIN 1.5* <1.5* <1.5*   Radiology Studies: CT ABDOMEN PELVIS W CONTRAST  Result Date: 04/13/2023 CLINICAL DATA:  Postop reduction of incarcerated hernia 02/02/2023; chronic mesenteric ischemia EXAM: CT ABDOMEN AND PELVIS WITH CONTRAST TECHNIQUE: Multidetector CT imaging of the abdomen and pelvis was performed using the standard protocol following bolus administration of intravenous contrast. RADIATION  DOSE REDUCTION: This exam was performed according to the departmental dose-optimization program which includes automated exposure control, adjustment of the mA and/or kV according to patient size and/or use of iterative reconstruction technique. CONTRAST:  85mL OMNIPAQUE IOHEXOL 300 MG/ML  SOLN COMPARISON:  CT abdomen and pelvis dated May 02/03/2023 FINDINGS: Lower chest: Pulmonary embolus of the right interlobar pulmonary artery and proximal segmental and subsegmental pulmonary arteries. Moderate hiatal hernia. Trace left-greater-than-right pleural effusions and bibasilar atelectasis. Hepatobiliary: Hepatic steatosis. Suspicious focal liver lesion. Prior cholecystectomy. Unchanged biliary ductal dilation. Pancreas: Unremarkable. No pancreatic ductal dilatation or surrounding inflammatory changes. Spleen: Normal in size without focal abnormality. Adrenals/Urinary Tract: Bilateral adrenal glands are unremarkable. No hydronephrosis or nephrolithiasis. Bladder is unremarkable. Stomach/Bowel: Prior gastric jejunostomy. Small hiatal  hernia containing the gastric pouch. Multiple thick-walled loops small bowel between two jejunal-jejunal anastomotic suture lines. Associated mesenteric edema and prominent mesenteric lymph nodes, similar to prior exam. No evidence of obstruction. Vascular/Lymphatic: Aortic atherosclerosis. No enlarged abdominal or pelvic lymph nodes. Reproductive: Adnexal mass. Evaluation of the pelvis somewhat limited due to streak artifact from right hip arthroplasty. Other: Postsurgical changes of the anterior abdominal wall. Small locule of soft tissue gas involving the right lower quadrant anterior abdominal wall, likely injection related. Musculoskeletal: Total right hip arthroplasty. Mild levocurvature of the lumbar spine. No aggressive appearing osseous lesions. IMPRESSION: 1. Pulmonary embolus of the right interlobar pulmonary artery and proximal segmental and subsegmental pulmonary arteries. 2. Multiple thick-walled loops of small bowel between two anastomotic suture lines with associated mesenteric edema and prominent mesenteric lymph nodes, findings are similar to prior exam and consistent with history of chronic mesenteric ischemia. 3. Trace left-greater-than-right pleural effusions and bibasilar atelectasis. 4. Aortic Atherosclerosis (ICD10-I70.0). Critical Value/emergent results (pulmonary embolus) were called by telephone at the time of interpretation on 04/13/2023 at 3:08 pm to provider Dr. Mariea Clonts, who verbally acknowledged these results. Electronically Signed   By: Allegra Lai M.D.   On: 04/13/2023 15:11   ECHOCARDIOGRAM COMPLETE  Result Date: 04/13/2023    ECHOCARDIOGRAM REPORT   Patient Name:   Kayla Dean Date of Exam: 04/13/2023 Medical Rec #:  [id number]       Height:       65.0 in Accession #:    [id number]      Weight:       134.0 lb Date of Birth:  September 09, 1951       BSA:          1.669 m Patient Age:    71 years        BP:           73/56 mmHg Patient Gender: F               HR:           71 bpm.  [facility] Procedure: 2D Echo, Cardiac Doppler and Color Doppler Indications:    CHF-Acute Diastolic I50.31  History:        Patient has prior history of Echocardiogram examinations, most                 recent 08/13/2018. Risk Factors:Family History of Coronary                 Artery Disease and Hypotension.  Sonographer:    Celesta Gentile RCS Referring Phys: [id number] OLADAPO ADEFESO IMPRESSIONS  1. Left ventricular ejection fraction, by estimation, is 60 to 65%. The left ventricle has normal function. The left ventricle has no  regional wall motion abnormalities. Left ventricular diastolic parameters were normal.  2. Right ventricular systolic function is normal. The right ventricular size is normal.  3. Left atrial size was mildly dilated.  4. The mitral valve is abnormal. Trivial mitral valve regurgitation. No evidence of mitral stenosis.  5. The aortic valve is tricuspid. There is moderate calcification of the aortic valve. There is moderate thickening of the aortic valve. Aortic valve regurgitation is mild. Aortic valve sclerosis is present, with no evidence of aortic valve stenosis.  6. The inferior vena cava is normal in size with greater than 50% respiratory variability, suggesting right atrial pressure of 3 mmHg. FINDINGS  Left Ventricle: Left ventricular ejection fraction, by estimation, is 60 to 65%. The left ventricle has normal function. The left ventricle has no regional wall motion abnormalities. The left ventricular internal cavity size was normal in size. There is  no left ventricular hypertrophy. Left ventricular diastolic parameters were normal. Right Ventricle: The right ventricular size is normal. No increase in right ventricular wall thickness. Right ventricular systolic function is normal. Left Atrium: Left atrial size was mildly dilated. Right Atrium: Right atrial size was normal in size. Pericardium: There is no evidence of pericardial effusion. Mitral Valve: The mitral valve  is abnormal. There is mild thickening of the mitral valve leaflet(s). Mild mitral annular calcification. Trivial mitral valve regurgitation. No evidence of mitral valve stenosis. Tricuspid Valve: The tricuspid valve is normal in structure. Tricuspid valve regurgitation is mild . No evidence of tricuspid stenosis. Aortic Valve: The aortic valve is tricuspid. There is moderate calcification of the aortic valve. There is moderate thickening of the aortic valve. Aortic valve regurgitation is mild. Aortic regurgitation PHT measures 778 msec. Aortic valve sclerosis is present, with no evidence of aortic valve stenosis. Aortic valve mean gradient measures 6.0 mmHg. Aortic valve peak gradient measures 14.7 mmHg. Aortic valve area, by VTI measures 1.92 cm. Pulmonic Valve: The pulmonic valve was normal in structure. Pulmonic valve regurgitation is not visualized. No evidence of pulmonic stenosis. Aorta: The aortic root is normal in size and structure. Venous: The inferior vena cava is normal in size with greater than 50% respiratory variability, suggesting right atrial pressure of 3 mmHg. IAS/Shunts: No atrial level shunt detected by color flow Doppler.  LEFT VENTRICLE PLAX 2D LVIDd:         4.20 cm   Diastology LVIDs:         2.30 cm   LV e' medial:    6.85 cm/s LV PW:         1.00 cm   LV E/e' medial:  11.5 LV IVS:        0.90 cm   LV e' lateral:   9.57 cm/s LVOT diam:     2.00 cm   LV E/e' lateral: 8.2 LV SV:         73 LV SV Index:   43 LVOT Area:     3.14 cm  RIGHT VENTRICLE RV S prime:     17.40 cm/s TAPSE (M-mode): 2.1 cm LEFT ATRIUM             Index        RIGHT ATRIUM           Index LA diam:        3.80 cm 2.28 cm/m   RA Area:     16.90 cm LA Vol (A2C):   68.8 ml 41.23 ml/m  RA Volume:   44.20 ml  26.49  ml/m LA Vol (A4C):   38.3 ml 22.95 ml/m LA Biplane Vol: 50.9 ml 30.51 ml/m  AORTIC VALVE AV Area (Vmax):    1.72 cm AV Area (Vmean):   2.05 cm AV Area (VTI):     1.92 cm AV Vmax:           192.00 cm/s AV  Vmean:          106.000 cm/s AV VTI:            0.377 m AV Peak Grad:      14.7 mmHg AV Mean Grad:      6.0 mmHg LVOT Vmax:         105.00 cm/s LVOT Vmean:        69.300 cm/s LVOT VTI:          0.231 m LVOT/AV VTI ratio: 0.61 AI PHT:            778 msec  AORTA Ao Root diam: 3.30 cm MITRAL VALVE               TRICUSPID VALVE MV Area (PHT): 2.45 cm    TR Peak grad:   30.5 mmHg MV Decel Time: 310 msec    TR Vmax:        276.00 cm/s MV E velocity: 78.50 cm/s MV A velocity: 84.30 cm/s  SHUNTS MV E/A ratio:  0.93        Systemic VTI:  0.23 m                            Systemic Diam: 2.00 cm Charlton Haws MD Electronically signed by Charlton Haws MD Signature Date/Time: 04/13/2023/1:28:51 PM    Final    DG Chest Portable 1 View  Result Date: 04/12/2023 CLINICAL DATA:  Shortness of breath. EXAM: PORTABLE CHEST 1 VIEW COMPARISON:  February 02, 2023 FINDINGS: The heart size and mediastinal contours are within normal limits. Mild atelectasis is seen within the bilateral lung bases. Small bilateral pleural effusions are noted. No pneumothorax is identified. A radiopaque surgical pin is seen overlying the right humeral head. Multilevel degenerative changes seen throughout the thoracic spine. IMPRESSION: Mild bibasilar atelectasis with small bilateral pleural effusions. Electronically Signed   By: Aram Candela M.D.   On: 04/12/2023 21:29    Scheduled Meds:  (feeding supplement) PROSource Plus  30 mL Oral BID BM   Chlorhexidine Gluconate Cloth  6 each Topical Daily   feeding supplement  1 Container Oral TID BM   levothyroxine  137 mcg Oral Q0600   midodrine  10 mg Oral TID WC   Continuous Infusions:  sodium chloride Stopped (04/14/23 0944)   heparin 1,000 Units/hr (04/14/23 1834)   norepinephrine (LEVOPHED) Adult infusion 6 mcg/min (04/14/23 1721)    LOS: 2 days   Shon Hale M.D on 04/14/2023 at 7:29 PM  Go to [email or web address] - for contact info  [facility] - Office  [phone]  If 7PM-7AM,  please contact night-coverage [email or web address] 04/14/2023, 7:29 PM

## 2023-04-15 ENCOUNTER — Other Ambulatory Visit: Payer: Self-pay

## 2023-04-15 DIAGNOSIS — I2699 Other pulmonary embolism without acute cor pulmonale: Secondary | ICD-10-CM | POA: Diagnosis not present

## 2023-04-15 DIAGNOSIS — I951 Orthostatic hypotension: Secondary | ICD-10-CM

## 2023-04-15 DIAGNOSIS — E46 Unspecified protein-calorie malnutrition: Secondary | ICD-10-CM | POA: Diagnosis not present

## 2023-04-15 DIAGNOSIS — I959 Hypotension, unspecified: Secondary | ICD-10-CM | POA: Diagnosis not present

## 2023-04-15 DIAGNOSIS — E8809 Other disorders of plasma-protein metabolism, not elsewhere classified: Secondary | ICD-10-CM | POA: Diagnosis not present

## 2023-04-15 LAB — BASIC METABOLIC PANEL
Anion gap: 7 (ref 5–15)
BUN: 10 mg/dL (ref 8–23)
CO2: 23 mmol/L (ref 22–32)
Calcium: 6.9 mg/dL — ABNORMAL LOW (ref 8.9–10.3)
Chloride: 101 mmol/L (ref 98–111)
Creatinine, Ser: 0.58 mg/dL (ref 0.44–1.00)
GFR, Estimated: >60 mL/min (ref >60)
Glucose, Bld: 149 mg/dL — ABNORMAL HIGH (ref 70–99)
Potassium: 3.8 mmol/L (ref 3.5–5.1)
Sodium: 131 mmol/L — ABNORMAL LOW (ref 135–145)

## 2023-04-15 LAB — COOXEMETRY PANEL
Carboxyhemoglobin: <0.3 % — ABNORMAL LOW (ref 0.5–1.5)
Methemoglobin: <0.7 % (ref 0.0–1.5)
O2 Saturation: 97 %
Total hemoglobin: 9.9 g/dL — ABNORMAL LOW (ref 12.0–16.0)

## 2023-04-15 LAB — CBC
HCT: 31.4 % — ABNORMAL LOW (ref 36.0–46.0)
Hemoglobin: 10.2 g/dL — ABNORMAL LOW (ref 12.0–15.0)
MCH: 31 pg (ref 26.0–34.0)
MCHC: 32.5 g/dL (ref 30.0–36.0)
MCV: 95.4 fL (ref 80.0–100.0)
Platelets: 237 10*3/uL (ref 150–400)
RBC: 3.29 MIL/uL — ABNORMAL LOW (ref 3.87–5.11)
RDW: 18.7 % — ABNORMAL HIGH (ref 11.5–15.5)
WBC: 8 10*3/uL (ref 4.0–10.5)
nRBC: 0 % (ref 0.0–0.2)

## 2023-04-15 LAB — CULTURE, BLOOD (ROUTINE X 2): Special Requests: ADEQUATE

## 2023-04-15 LAB — GLUCOSE, CAPILLARY
Glucose-Capillary: 119 mg/dL — ABNORMAL HIGH (ref 70–99)
Glucose-Capillary: 127 mg/dL — ABNORMAL HIGH (ref 70–99)
Glucose-Capillary: 89 mg/dL (ref 70–99)

## 2023-04-15 LAB — PROCALCITONIN: Procalcitonin: 0.12 ng/mL

## 2023-04-15 LAB — LACTIC ACID, PLASMA: Lactic Acid, Venous: 3.5 mmol/L (ref 0.5–1.9)

## 2023-04-15 LAB — HEPARIN LEVEL (UNFRACTIONATED): Heparin Unfractionated: 0.5 IU/mL (ref 0.30–0.70)

## 2023-04-15 MED ORDER — SODIUM CHLORIDE 0.9 % IV SOLN: INTRAVENOUS | Status: DC | PRN

## 2023-04-15 MED ORDER — SODIUM CHLORIDE 0.9% FLUSH: 10.0000 mL | INTRAVENOUS | Status: DC | PRN

## 2023-04-15 MED ORDER — ZOLPIDEM TARTRATE 5 MG PO TABS
5.0000 mg | ORAL_TABLET | Freq: Every evening | ORAL | Status: DC | PRN
  Administered 2023-04-15: 5 mg via ORAL
  Filled 2023-04-15: qty 1

## 2023-04-15 MED ORDER — NITROGLYCERIN 2 % TD OINT
1.0000 [in_us] | TOPICAL_OINTMENT | Freq: Three times a day (TID) | TRANSDERMAL | Status: AC
  Administered 2023-04-15 – 2023-04-16 (×6): 1 [in_us] via TOPICAL
  Filled 2023-04-15: qty 1
  Filled 2023-04-15: qty 30
  Filled 2023-04-15: qty 1

## 2023-04-15 MED ORDER — SODIUM CHLORIDE 0.9% FLUSH
10.0000 mL | Freq: Two times a day (BID) | INTRAVENOUS | Status: DC
  Administered 2023-04-15: 20 mL
  Administered 2023-04-15: 10 mL
  Administered 2023-04-16: 20 mL
  Administered 2023-04-16 – 2023-04-17 (×3): 10 mL
  Administered 2023-04-18: 30 mL
  Administered 2023-04-18 – 2023-04-19 (×2): 10 mL
  Administered 2023-04-19: 30 mL
  Administered 2023-04-20 – 2023-04-26 (×13): 10 mL

## 2023-04-15 MED ORDER — PHENTOLAMINE MESYLATE 5 MG IJ SOLR
5.0000 mg | Freq: Once | INTRAMUSCULAR | Status: AC
  Administered 2023-04-15: 5 mg via SUBCUTANEOUS
  Filled 2023-04-15: qty 5

## 2023-04-15 MED ORDER — PIPERACILLIN-TAZOBACTAM 3.375 G IVPB
3.3750 g | Freq: Three times a day (TID) | INTRAVENOUS | Status: AC
  Administered 2023-04-15 – 2023-04-19 (×13): 3.375 g via INTRAVENOUS
  Filled 2023-04-15 (×13): qty 50

## 2023-04-15 NOTE — Progress Notes (Signed)
Peripherally Inserted Central Catheter Placement  The IV Nurse has discussed with the patient and/or persons authorized to consent for the patient, the purpose of this procedure and the potential benefits and risks involved with this procedure.  The benefits include less needle sticks, lab draws from the catheter, and the patient may be discharged home with the catheter. Risks include, but not limited to, infection, bleeding, blood clot (thrombus formation), and puncture of an artery; nerve damage and irregular heartbeat and possibility to perform a PICC exchange if needed/ordered by physician.  Alternatives to this procedure were also discussed.  Bard Power PICC patient education guide, fact sheet on infection prevention and patient information card has been provided to patient /or left at bedside.    PICC Placement Documentation  PICC Double Lumen 04/15/23 Right Brachial 36 cm 0 cm (Active)  Indication for Insertion or Continuance of Line Vasoactive infusions 04/15/23 1008  Exposed Catheter (cm) 0 cm 04/15/23 1008  Site Assessment Clean, Dry, Intact 04/15/23 1008  Lumen #1 Status Flushed;Saline locked;Blood return noted 04/15/23 1008  Lumen #2 Status Flushed;Saline locked;Blood return noted 04/15/23 1008  Dressing Type Transparent;Securing device 04/15/23 1008  Dressing Status Antimicrobial disc in place;Clean, Dry, Intact 04/15/23 1008  Safety Lock Not Applicable 04/15/23 1008  Line Care Connections checked and tightened 04/15/23 1008  Line Adjustment (NICU/IV Team Only) No 04/15/23 1008  Dressing Intervention New dressing 04/15/23 1008  Dressing Change Due 04/22/23 04/15/23 1008       Kayla Dean 04/15/2023, 10:09 AM

## 2023-04-15 NOTE — Progress Notes (Signed)
ANTICOAGULATION CONSULT NOTE -   Pharmacy Consult for heparin Indication: pulmonary embolus  No Known Allergies  Patient Measurements: Height: 5\' 5"  (165.1 cm) Weight: 58 kg (127 lb 13.9 oz) IBW/kg (Calculated) : 57 Heparin Dosing Weight: 60.8 kg  Vital Signs: Temp: 98.1 F (36.7 C) (06/30 0339) Temp Source: Oral (06/30 0339) BP: 123/62 (06/30 0730) Pulse Rate: 56 (06/30 0730)  Labs: Recent Labs    04/12/23 1730 04/12/23 1935 04/13/23 0458 04/13/23 2248 04/14/23 0421 04/14/23 0810 04/15/23 0530  HGB 12.9  --  9.6*  --  9.9*  --  10.2*  HCT 40.3  --  29.9*  --  30.2*  --  31.4*  PLT 262  --  163  --  205  --  237  HEPARINUNFRC  --   --   --  0.38  --  0.37 0.50  CREATININE 0.70  --  0.65  --  0.55  --  0.58  TROPONINIHS 4 4  --   --   --   --   --      Estimated Creatinine Clearance: 57.2 mL/min (by C-G formula based on SCr of 0.58 mg/dL).   Medical History: Past Medical History:  Diagnosis Date   Aortic aneurysm (HCC)    Arthritis    H/O gastric bypass    Hiatal hernia    Hypothyroidism    Small bowel obstruction (HCC)     Medications:  Medications Prior to Admission  Medication Sig Dispense Refill Last Dose   acetaminophen (TYLENOL) 500 MG tablet Take 1,000 mg by mouth every 8 (eight) hours as needed for moderate pain.   Past Week   Calcium Carb-Cholecalciferol (CALCIUM 600 + D) 600-200 MG-UNIT TABS Take 1 tablet by mouth every morning.   Past Week   Cholecalciferol (VITAMIN D-3 PO) Take 1 capsule by mouth daily.   Past Week   ferrous sulfate 325 (65 FE) MG tablet Take 325 mg by mouth every morning.   Past Week   furosemide (LASIX) 80 MG tablet Take 1 tablet (80 mg total) by mouth daily. 90 tablet 3 Past Week   levothyroxine (SYNTHROID, LEVOTHROID) 137 MCG tablet Take 137 mcg by mouth daily before breakfast.   04/12/2023   Multiple Vitamin (MULTIVITAMIN WITH MINERALS) TABS tablet Take 1 tablet by mouth every morning.   04/12/2023   ondansetron  (ZOFRAN-ODT) 4 MG disintegrating tablet Take 1 tablet (4 mg total) by mouth every 6 (six) hours as needed for nausea. 20 tablet 0 Past Week   oxyCODONE (OXY IR/ROXICODONE) 5 MG immediate release tablet Take 1 tablet (5 mg total) by mouth every 4 (four) hours as needed for severe pain or breakthrough pain. 15 tablet 0 Past Week   potassium chloride SA (KLOR-CON M) 20 MEQ tablet Take 1 tablet (20 mEq total) by mouth daily. 90 tablet 3 04/12/2023   zolpidem (AMBIEN) 5 MG tablet Take 1 tablet (5 mg total) by mouth at bedtime as needed for sleep. 30 tablet 0 Past Month    Assessment: Pharmacy consulted to dose heparin in patient with pulmonary embolism.  Patient is not on anticoagulation prior to admission but received lovenox 40 mg subcutaneous 6/28 @ 0613.  HL 0.50- therapeutic Hgb 10.2  Goal of Therapy:  Heparin level 0.3-0.7 units/ml Monitor platelets by anticoagulation protocol: Yes   Plan:  Continue heparin infusion at 1000 units/hr Check anti-Xa level daily Continue to monitor H&H and platelets   Judeth Cornfield, PharmD Clinical Pharmacist 04/15/2023 7:36 AM

## 2023-04-15 NOTE — H&P (Addendum)
NAME:  Kayla Dean, MRN:  [id number], DOB:  May 08, 1951, LOS: 3 ADMISSION DATE:  04/12/2023, CONSULTATION DATE:  04/15/2023 REFERRING MD:  [facility] , CHIEF COMPLAINT:  shock    History of Present Illness:   72 yo FM, PMH h/o gastric bypass, hiatal hernia, hypothyroid. Has a history of gastric bypass, s/p exploratory laprotomy, reduction, of previous incarcerated hernia. CT shows   Pertinent  Medical History   Past Medical History:  Diagnosis Date   Aortic aneurysm (HCC)    Arthritis    H/O gastric bypass    Hiatal hernia    Hypothyroidism    Small bowel obstruction (HCC)     Echo: IMPRESSIONS   1. Left ventricular ejection fraction, by estimation, is 60 to 65%. The  left ventricle has normal function. The left ventricle has no regional  wall motion abnormalities. Left ventricular diastolic parameters were  normal.   2. Right ventricular systolic function is normal. The right ventricular  size is normal.   3. Left atrial size was mildly dilated.   4. The mitral valve is abnormal. Trivial mitral valve regurgitation. No  evidence of mitral stenosis.   5. The aortic valve is tricuspid. There is moderate calcification of the  aortic valve. There is moderate thickening of the aortic valve. Aortic  valve regurgitation is mild. Aortic valve sclerosis is present, with no  evidence of aortic valve stenosis.   6. The inferior vena cava is normal in size with greater than 50%  respiratory variability, suggesting right atrial pressure of 3 mmHg.    Significant Hospital Events: Including procedures, antibiotic start and stop dates in addition to other pertinent events     Interim History / Subjective:  Overall patient feels better.  Still on Levophed and vasopressin to maintain blood pressure.  Objective   Blood pressure (!) 110/56, pulse 64, temperature 98 F (36.7 C), temperature source Oral, resp. rate 17, height 5\' 5"  (1.651 m), weight 63.4 kg, SpO2 98 %.        Intake/Output  Summary (Last 24 hours) at 04/15/2023 1526 Last data filed at 04/15/2023 1336 Gross per 24 hour  Intake 1798.96 ml  Output 600 ml  Net 1198.96 ml   Filed Weights   04/14/23 0348 04/15/23 0539 04/15/23 1445  Weight: 58 kg 58 kg 63.4 kg    Examination: General: Elderly female resting comfortably in bed conversant HENT: NCAT, tracking appropriately Lungs: Clear to auscultation bilaterally Cardiovascular: Regular rate rhythm, S1-S2 Abdomen: Soft mild tenderness Extremities: No significant edema, trace dependent edema bilateral upper and lower extremities Neuro: Alert oriented following commands moves all 4 extremities no focal deficit GU: Deferred  Resolved Hospital Problem list    Assessment & Plan:   Hypotension, multifactorial etiology, unclear source if there is underlying sepsis, possible colitis on CT imaging History of chronic mesenteric ischemia Coox normal  Plan: Remains on norepinephrine and vasopressin Send blood cultures Send urine cultures Start Zosyn I think high-dose pressors put her at risk for ongoing gut ischemia in the setting of her chronic mesenteric ischemic issues. Would try to avoid this if possible.  Okay to continue midodrine.  Subsegmental right-sided pulmonary embolism I do not believe this is contributing that much to her hypotension Echocardiogram normal Plan: Continue IV heparin  Poor p.o. intake, low albumin Plan: Already received IV albumin Continue nutritional supplements Holding diuretics  Hypothyroidism Continue home levothyroxine Consult RD    Best Practice (right click and "Reselect all SmartList Selections" daily)   Diet/type: NPO  DVT prophylaxis: systemic heparin GI prophylaxis: N/A Lines: Central line Foley:  N/A Code Status:  full code Last date of multidisciplinary goals of care discussion [met with patient and patient's daughter at bedside.]  Labs   CBC: Recent Labs  Lab 04/12/23 1730 04/13/23 0458  04/14/23 0421 04/15/23 0530  WBC 12.2* 7.4 10.1 8.0  HGB 12.9 9.6* 9.9* 10.2*  HCT 40.3 29.9* 30.2* 31.4*  MCV 96.2 97.4 95.3 95.4  PLT 262 163 205 237    Basic Metabolic Panel: Recent Labs  Lab 04/10/23 1248 04/12/23 1730 04/13/23 0458 04/14/23 0421 04/15/23 0530  NA 134* 136 135 134* 131*  K 3.5 3.9 3.7 3.3* 3.8  CL 100 98 102 101 101  CO2 28 29 28 27 23   GLUCOSE 95 100* 79 89 149*  BUN 14 14 15 9 10   CREATININE 0.68 0.70 0.65 0.55 0.58  CALCIUM 7.1* 7.7* 6.8* 6.7* 6.9*  MG  --   --  2.1  --   --   PHOS  --   --  3.4  --   --    GFR: Estimated Creatinine Clearance: 57.2 mL/min (by C-G formula based on SCr of 0.58 mg/dL). Recent Labs  Lab 04/12/23 1730 04/13/23 0458 04/14/23 0421 04/15/23 0530 04/15/23 1134  WBC 12.2* 7.4 10.1 8.0  --   LATICACIDVEN  --   --   --   --  3.5*    Liver Function Tests: Recent Labs  Lab 04/12/23 1730 04/13/23 0458 04/14/23 0421  AST 19 13* 15  ALT 29 19 17   ALKPHOS 129* 86 84  BILITOT 0.7 0.8 0.6  PROT 4.9* 3.6* 3.6*  ALBUMIN 1.5* <1.5* <1.5*   No results for input(s): "LIPASE", "AMYLASE" in the last 168 hours. No results for input(s): "AMMONIA" in the last 168 hours.  ABG    Component Value Date/Time   O2SAT 97 04/15/2023 1141     Coagulation Profile: No results for input(s): "INR", "PROTIME" in the last 168 hours.  Cardiac Enzymes: No results for input(s): "CKTOTAL", "CKMB", "CKMBINDEX", "TROPONINI" in the last 168 hours.  HbA1C: Hgb A1c MFr Bld  Date/Time Value Ref Range Status  08/13/2018 04:01 AM 4.7 (L) 4.8 - 5.6 % Final    Comment:    (NOTE) Pre diabetes:          5.7%-6.4% Diabetes:              >6.4% Glycemic control for   <7.0% adults with diabetes     CBG: No results for input(s): "GLUCAP" in the last 168 hours.  Review of Systems:   Critically ill  Past Medical History:  She,  has a past medical history of Aortic aneurysm (HCC), Arthritis, H/O gastric bypass, Hiatal hernia,  Hypothyroidism, and Small bowel obstruction (HCC).   Surgical History:   Past Surgical History:  Procedure Laterality Date   ABDOMINAL HYSTERECTOMY     CHOLECYSTECTOMY     COLON SURGERY     HERNIA REPAIR     LAPAROTOMY N/A 02/02/2023   Procedure: EXPLORATORY LAPAROTOMY, [name], EXPLANT OF ABDOMINAL MESH;  Surgeon: Lucretia Roers, MD;  Location: [facility];  Service: General;  Laterality: N/A;   REPLACEMENT TOTAL KNEE Right    ROTATOR CUFF REPAIR Right    TOTAL HIP ARTHROPLASTY Right      Social History:   reports that she has never smoked. She has never used smokeless tobacco. She reports that she does not drink alcohol and does  not use drugs.   Family History:  Her family history includes CAD in her brother and father.   Allergies No Known Allergies   Home Medications  Prior to Admission medications   Medication Sig Start Date End Date Taking? Authorizing Provider  acetaminophen (TYLENOL) 500 MG tablet Take 1,000 mg by mouth every 8 (eight) hours as needed for moderate pain.   Yes [provider]  Calcium Carb-Cholecalciferol (CALCIUM 600 + D) 600-200 MG-UNIT TABS Take 1 tablet by mouth every morning.   Yes [provider]  Cholecalciferol (VITAMIN D-3 PO) Take 1 capsule by mouth daily.   Yes [provider]  ferrous sulfate 325 (65 FE) MG tablet Take 325 mg by mouth every morning.   Yes [provider]  furosemide (LASIX) 80 MG tablet Take 1 tablet (80 mg total) by mouth daily. 04/10/23 07/09/23 Yes [name], MD  levothyroxine (SYNTHROID, LEVOTHROID) 137 MCG tablet Take 137 mcg by mouth daily before breakfast.   Yes [provider]  Multiple Vitamin (MULTIVITAMIN WITH MINERALS) TABS tablet Take 1 tablet by mouth every morning.   Yes [provider]  ondansetron (ZOFRAN-ODT) 4 MG disintegrating tablet Take 1 tablet (4 mg total) by mouth every 6 (six) hours as needed for nausea. 02/08/23  Yes  Lucretia Roers, MD  oxyCODONE (OXY IR/ROXICODONE) 5 MG immediate release tablet Take 1 tablet (5 mg total) by mouth every 4 (four) hours as needed for severe pain or breakthrough pain. 03/15/23  Yes Lucretia Roers, MD  potassium chloride SA (KLOR-CON M) 20 MEQ tablet Take 1 tablet (20 mEq total) by mouth daily. 04/10/23  Yes [name], MD  zolpidem (AMBIEN) 5 MG tablet Take 1 tablet (5 mg total) by mouth at bedtime as needed for sleep. 02/15/23  Yes Lucretia Roers, MD     This patient is critically ill with multiple organ system failure; which, requires frequent high complexity decision making, assessment, support, evaluation, and titration of therapies. This was completed through the application of advanced monitoring technologies and extensive interpretation of multiple databases. During this encounter critical care time was devoted to patient care services described in this note for 32 minutes.  Josephine Igo, DO [facility] 04/15/2023 3:26 PM

## 2023-04-15 NOTE — Progress Notes (Signed)
Pt complains of burning on IV on left lower forearm which has the Levo running, assessed IV and infusion immediately stop due to arm swelling. Removed IV and placed warm pack. Inserted another IV in the right arm, informed MD and [facility] pharmacy due to possible infiltration/extravasation in that IV. Pt continuously complaining of pain in the said arm, given pain meds, phentolamine subcu and nitroglycerin ointment as ordered. Kept monitored.

## 2023-04-15 NOTE — Progress Notes (Signed)
Pharmacy Antibiotic Note  Kayla Dean is a 72 y.o. female admitted on 04/12/2023 with PE now with concerns for colitis.  Pharmacy has been consulted for Zosyn dosing.  CrCl 50-60 mL/min  Plan: Zosyn 3.375g Q8H Pharmacy will sign off and monitor peripherally for dose adjustments  Height: 5\' 5"  (165.1 cm) Weight: 63.4 kg (139 lb 12.4 oz) IBW/kg (Calculated) : 57  Temp (24hrs), Avg:97.9 F (36.6 C), Min:97.5 F (36.4 C), Max:98.1 F (36.7 C)  Recent Labs  Lab 04/10/23 1248 04/12/23 1730 04/13/23 0458 04/14/23 0421 04/15/23 0530 04/15/23 1134  WBC  --  12.2* 7.4 10.1 8.0  --   CREATININE 0.68 0.70 0.65 0.55 0.58  --   LATICACIDVEN  --   --   --   --   --  3.5*    Estimated Creatinine Clearance: 57.2 mL/min (by C-G formula based on SCr of 0.58 mg/dL).    No Known Allergies  Thank you for allowing pharmacy to be a part of this patient's care.  Ellis Savage, PharmD Clinical Pharmacist 04/15/2023 3:55 PM

## 2023-04-15 NOTE — Procedures (Signed)
Arterial Catheter Insertion Procedure Note  JENNIFIER DVORAK  [id number]  [birth year]  Date:04/15/23  Time:9:13 AM    Provider Performing: Lucia Gaskins    Procedure: Insertion of Arterial Line (40981) without US guidance  Indication(s) Blood pressure monitoring and/or need for frequent ABGs  Consent Risks of the procedure as well as the alternatives and risks of each were explained to the patient and/or caregiver.  Consent for the procedure was obtained and is signed in the bedside chart  Anesthesia None   Time Out Verified patient identification, verified procedure, site/side was marked, verified correct patient position, special equipment/implants available, medications/allergies/relevant history reviewed, required imaging and test results available.   Sterile Technique Maximal sterile technique including full sterile barrier drape, hand hygiene, sterile gown, sterile gloves, mask, hair covering, sterile ultrasound probe cover (if used).   Procedure Description Area of catheter insertion was cleaned with chlorhexidine and draped in sterile fashion. Without real-time ultrasound guidance an arterial catheter was placed into the left radial artery.  Appropriate arterial tracings confirmed on monitor.     Complications/Tolerance None; patient tolerated the procedure well.   EBL Minimal   Specimen(s) None

## 2023-04-15 NOTE — Progress Notes (Signed)
Events over past 24hours noted.  Will follow up upon discharge.

## 2023-04-15 NOTE — Progress Notes (Addendum)
PROGRESS NOTE   Chelene Sferrazza, is a 72 y.o. female, DOB - 1951/05/01, ZOX:[id number]  Admit date - 04/12/2023   Admitting Physician Frankey Shown, DO  Outpatient Primary MD for the patient is [facility]  LOS - 3  Chief Complaint  Patient presents with   Hypotension       Brief Narrative:  72 y.o. female with medical history significant of thyroidism, aortic aneurysm, hiatal hernia, history of gastric bypass  , s/p exploratory laparotomy, reduction of incarcerated hernia and primary hernia repair on 02/02/2023 who was admitted on 04/12/2023 from general surgery office after being found on dyspnea on exertion and lower extremity edema, CT abdomen and pelvis incidentally shows right-sided pulmonary embolism--now with persistent hypotension requiring pressors   -Assessment and Plan: 1)Rt Sided Pulm Embolism--- May be contributing to hypotension -Echo with EF of 60 to 65%, no regional wall motion abnormalities, diastolic parameters are WNL -No aortic stenosis, no mitral stenosis -CT findings and echo findings not consistent with right heart strain- -Fatigue and dyspnea on exertion persist -Orthostatic dizziness persist -BNP 135 04/15/23 -Hypotension and dizziness and shortness of breath persist -c/n  IV heparin,  -Hold off on transitioning to DOAC due to hemodynamic instability at this time   2)Persistent hypotension--- multifactorial -#1 above is contributory -Patient also has hypoalbuminemia with third spacing/generalized edema -Cardiology consult appreciated -Echocardiogram as noted above -Received IV Albulmin -- a.m. cortisol is 12.5 (Not Low) 04/15/23 -Persistent hypotension and orthostatic dizziness despite Levophed overnight, vasopressin added -IV with Levophed infiltrated patient received appropriate Rx for extravasation of Levophed into the left arm -Now that patient has a PICC line in the left arm we will titrate up Levophed on try to wean off  vasopressin -Okay to stop Po Midodrine -No fevers or leukocytosis or evidence of infection (chest x-ray from 04/12/2023 noted, CT abdomen and pelvis with contrast from 04/13/2023 noted) -When Levophed was paused briefly by PICC line team patient became very very hypotensive with systolic blood pressure less than 50 mmhg -Critical care consult from Dr. Levon Hedger requested  3)Third spacing/Generalized edema--- albumin is 1.5, total protein 4.9 -Nutritional supplements as ordered -- Patient received IV Albulmin -Hold off on diuretics -Continue TED stockings  4)Hypothyroidism---TSH 3.28 -Continue levothyroxine  5)Abd Pain--CT abdomen and pelvis shows previously established mesenteric edematous bowel  -No additional new acute findings -Please see notes from Dr. Henreitta Leber and Dr. Lovell Sheehan from general surgery teams  Procedures:- Rt Arm PICC line-placed 04/15/2023 Arterial LIne in Lt arm on 04/15/23  CRITICAL CARE Performed by: Shon Hale   Total critical care time: 59 minutes  Critical care time was exclusive of separately billable procedures and treating other patients. Persistent hypotension requiring IV Levophed for pressure support, vasopressin added Critical care was necessary to treat or prevent imminent or life-threatening deterioration.  Critical care was time spent personally by me on the following activities: development of treatment plan with patient and/or surrogate as well as nursing, discussions with consultants, evaluation of patient's response to treatment, examination of patient, obtaining history from patient or surrogate, ordering and performing treatments and interventions, ordering and review of laboratory studies, ordering and review of radiographic studies, pulse oximetry and re-evaluation of patient's condition.   Status is: Inpatient   Disposition: The patient is from: Home              Anticipated d/c is to: Home              Anticipated d/c date is: >  3  days              Patient currently is not medically stable to d/c. Barriers: Not Clinically Stable-   Code Status :  -  Code Status: Full Code   Family Communication:    NA (patient is alert, awake and coherent)  Discussed with Daughter Olegario Messier at bedside  DVT Prophylaxis  :   - SCDs   Place TED hose Start: 04/14/23 1931 Place TED hose Start: 04/13/23 0859 SCDs Start: 04/13/23 0447   Lab Results  Component Value Date   PLT 237 04/15/2023   Inpatient Medications  Scheduled Meds:  (feeding supplement) PROSource Plus  30 mL Oral BID BM   Chlorhexidine Gluconate Cloth  6 each Topical Daily   feeding supplement  1 Container Oral TID BM   levothyroxine  137 mcg Oral Q0600   midodrine  10 mg Oral TID WC   nitroGLYCERIN  1 inch Topical Q8H   sodium chloride flush  10-40 mL Intracatheter Q12H   Continuous Infusions:  sodium chloride Stopped (04/14/23 0944)   sodium chloride     heparin 1,000 Units/hr (04/15/23 0907)   norepinephrine (LEVOPHED) Adult infusion 10 mcg/min (04/15/23 0907)   vasopressin 0.02 Units/min (04/15/23 0907)   PRN Meds:.Place/Maintain arterial line **AND** sodium chloride, acetaminophen **OR** acetaminophen, ondansetron **OR** ondansetron (ZOFRAN) IV, sodium chloride flush, zolpidem   Anti-infectives (From admission, onward)    None      Subjective: Monica Martinez today has no fevers, no emesis,  No chest pain,    -Persistent hypotension and orthostatic dizziness despite Levophed overnight, vasopressin added -IV with Levophed infiltrated patient received appropriate Rx for extravasation of Levophed into the left arm No fever  Or chills  -Appetite remains poor -Urine output is not great -Daughter Olegario Messier is at bedside - Objective: Vitals:   04/15/23 0730 04/15/23 0800 04/15/23 0830 04/15/23 0900  BP: 123/62 (!) 82/56 (!) 115/59 (!) 110/56  Pulse: (!) 56 (!) 50 (!) 50 (!) 51  Resp: 19 16 15 15   Temp:      TempSrc:      SpO2: 95% 97% 96% 100%   Weight:      Height:        Intake/Output Summary (Last 24 hours) at 04/15/2023 1041 Last data filed at 04/15/2023 0907 Gross per 24 hour  Intake 1387.34 ml  Output 600 ml  Net 787.34 ml   Filed Weights   04/13/23 1707 04/14/23 0348 04/15/23 0539  Weight: 62.5 kg 58 kg 58 kg   Temp:  [97.7 F (36.5 C)-98.3 F (36.8 C)] 98.1 F (36.7 C) (06/30 0339) Pulse Rate:  [42-81] 51 (06/30 0900) Resp:  [10-26] 15 (06/30 0900) BP: (61-136)/(42-74) 110/56 (06/30 0900) SpO2:  [92 %-100 %] 100 % (06/30 0900) Weight:  [58 kg] 58 kg (06/30 0539)   Physical Exam Gen:- Awake Alert,  in no apparent distress  HEENT:- [place].AT, No sclera icterus Neck-Supple Neck,No JVD,.  Lungs-  CTAB , fair symmetrical air movement CV- S1, S2 normal, regular  Abd-  +ve B.Sounds, Abd Soft, No tenderness,    Extremity/Skin:- +2 pitting edema, pedal pulses present  Psych-affect is appropriate, oriented x3 Neuro-no new focal deficits, no tremors MSK-Rt Arm PICC line-placed 04/15/2023 Arterial LIne in Lt arm  Data Reviewed: I have personally reviewed following labs and imaging studies  CBC: Recent Labs  Lab 04/12/23 1730 04/13/23 0458 04/14/23 0421 04/15/23 0530  WBC 12.2* 7.4 10.1 8.0  HGB 12.9 9.6* 9.9* 10.2*  HCT 40.3 29.9* 30.2* 31.4*  MCV 96.2 97.4 95.3 95.4  PLT 262 163 205 237   Basic Metabolic Panel: Recent Labs  Lab 04/10/23 1248 04/12/23 1730 04/13/23 0458 04/14/23 0421 04/15/23 0530  NA 134* 136 135 134* 131*  K 3.5 3.9 3.7 3.3* 3.8  CL 100 98 102 101 101  CO2 28 29 28 27 23   GLUCOSE 95 100* 79 89 149*  BUN 14 14 15 9 10   CREATININE 0.68 0.70 0.65 0.55 0.58  CALCIUM 7.1* 7.7* 6.8* 6.7* 6.9*  MG  --   --  2.1  --   --   PHOS  --   --  3.4  --   --    GFR: Estimated Creatinine Clearance: 57.2 mL/min (by C-G formula based on SCr of 0.58 mg/dL). Liver Function Tests: Recent Labs  Lab 04/12/23 1730 04/13/23 0458 04/14/23 0421  AST 19 13* 15  ALT 29 19 17   ALKPHOS 129* 86 84   BILITOT 0.7 0.8 0.6  PROT 4.9* 3.6* 3.6*  ALBUMIN 1.5* <1.5* <1.5*   Radiology Studies: [place] EKG SITE RITE  Result Date: 04/15/2023 If Site Rite image not attached, placement could not be confirmed due to current cardiac rhythm.  CT ABDOMEN PELVIS W CONTRAST  Result Date: 04/13/2023 CLINICAL DATA:  Postop reduction of incarcerated hernia 02/02/2023; chronic mesenteric ischemia EXAM: CT ABDOMEN AND PELVIS WITH CONTRAST TECHNIQUE: Multidetector CT imaging of the abdomen and pelvis was performed using the standard protocol following bolus administration of intravenous contrast. RADIATION DOSE REDUCTION: This exam was performed according to the departmental dose-optimization program which includes automated exposure control, adjustment of the mA and/or kV according to patient size and/or use of iterative reconstruction technique. CONTRAST:  85mL OMNIPAQUE IOHEXOL 300 MG/ML  SOLN COMPARISON:  CT abdomen and pelvis dated May 02/03/2023 FINDINGS: Lower chest: Pulmonary embolus of the right interlobar pulmonary artery and proximal segmental and subsegmental pulmonary arteries. Moderate hiatal hernia. Trace left-greater-than-right pleural effusions and bibasilar atelectasis. Hepatobiliary: Hepatic steatosis. Suspicious focal liver lesion. Prior cholecystectomy. Unchanged biliary ductal dilation. Pancreas: Unremarkable. No pancreatic ductal dilatation or surrounding inflammatory changes. Spleen: Normal in size without focal abnormality. Adrenals/Urinary Tract: Bilateral adrenal glands are unremarkable. No hydronephrosis or nephrolithiasis. Bladder is unremarkable. Stomach/Bowel: Prior gastric jejunostomy. Small hiatal hernia containing the gastric pouch. Multiple thick-walled loops small bowel between two jejunal-jejunal anastomotic suture lines. Associated mesenteric edema and prominent mesenteric lymph nodes, similar to prior exam. No evidence of obstruction. Vascular/Lymphatic: Aortic atherosclerosis. No  enlarged abdominal or pelvic lymph nodes. Reproductive: Adnexal mass. Evaluation of the pelvis somewhat limited due to streak artifact from right hip arthroplasty. Other: Postsurgical changes of the anterior abdominal wall. Small locule of soft tissue gas involving the right lower quadrant anterior abdominal wall, likely injection related. Musculoskeletal: Total right hip arthroplasty. Mild levocurvature of the lumbar spine. No aggressive appearing osseous lesions. IMPRESSION: 1. Pulmonary embolus of the right interlobar pulmonary artery and proximal segmental and subsegmental pulmonary arteries. 2. Multiple thick-walled loops of small bowel between two anastomotic suture lines with associated mesenteric edema and prominent mesenteric lymph nodes, findings are similar to prior exam and consistent with history of chronic mesenteric ischemia. 3. Trace left-greater-than-right pleural effusions and bibasilar atelectasis. 4. Aortic Atherosclerosis (ICD10-I70.0). Critical Value/emergent results (pulmonary embolus) were called by telephone at the time of interpretation on 04/13/2023 at 3:08 pm to provider Dr. Mariea Clonts, who verbally acknowledged these results. Electronically Signed   By: Allegra Lai M.D.   On: 04/13/2023 15:11  ECHOCARDIOGRAM COMPLETE  Result Date: 04/13/2023    ECHOCARDIOGRAM REPORT   Patient Name:   RAETTA HUESTIS Date of Exam: 04/13/2023 Medical Rec #:  [id number]       Height:       65.0 in Accession #:    [id number]      Weight:       134.0 lb Date of Birth:  [birth year]       BSA:          1.669 m Patient Age:    71 years        BP:           73/56 mmHg Patient Gender: F               HR:           71 bpm. [facility] Procedure: 2D Echo, Cardiac Doppler and Color Doppler Indications:    CHF-Acute Diastolic I50.31  History:        Patient has prior history of Echocardiogram examinations, most                 recent 08/13/2018. Risk Factors:Family History of Coronary                  Artery Disease and Hypotension.  Sonographer:    Celesta Gentile RCS Referring Phys: [id number] OLADAPO ADEFESO IMPRESSIONS  1. Left ventricular ejection fraction, by estimation, is 60 to 65%. The left ventricle has normal function. The left ventricle has no regional wall motion abnormalities. Left ventricular diastolic parameters were normal.  2. Right ventricular systolic function is normal. The right ventricular size is normal.  3. Left atrial size was mildly dilated.  4. The mitral valve is abnormal. Trivial mitral valve regurgitation. No evidence of mitral stenosis.  5. The aortic valve is tricuspid. There is moderate calcification of the aortic valve. There is moderate thickening of the aortic valve. Aortic valve regurgitation is mild. Aortic valve sclerosis is present, with no evidence of aortic valve stenosis.  6. The inferior vena cava is normal in size with greater than 50% respiratory variability, suggesting right atrial pressure of 3 mmHg. FINDINGS  Left Ventricle: Left ventricular ejection fraction, by estimation, is 60 to 65%. The left ventricle has normal function. The left ventricle has no regional wall motion abnormalities. The left ventricular internal cavity size was normal in size. There is  no left ventricular hypertrophy. Left ventricular diastolic parameters were normal. Right Ventricle: The right ventricular size is normal. No increase in right ventricular wall thickness. Right ventricular systolic function is normal. Left Atrium: Left atrial size was mildly dilated. Right Atrium: Right atrial size was normal in size. Pericardium: There is no evidence of pericardial effusion. Mitral Valve: The mitral valve is abnormal. There is mild thickening of the mitral valve leaflet(s). Mild mitral annular calcification. Trivial mitral valve regurgitation. No evidence of mitral valve stenosis. Tricuspid Valve: The tricuspid valve is normal in structure. Tricuspid valve regurgitation is mild . No evidence of  tricuspid stenosis. Aortic Valve: The aortic valve is tricuspid. There is moderate calcification of the aortic valve. There is moderate thickening of the aortic valve. Aortic valve regurgitation is mild. Aortic regurgitation PHT measures 778 msec. Aortic valve sclerosis is present, with no evidence of aortic valve stenosis. Aortic valve mean gradient measures 6.0 mmHg. Aortic valve peak gradient measures 14.7 mmHg. Aortic valve area, by VTI measures 1.92 cm. Pulmonic Valve: The pulmonic valve was normal in structure. Pulmonic  valve regurgitation is not visualized. No evidence of pulmonic stenosis. Aorta: The aortic root is normal in size and structure. Venous: The inferior vena cava is normal in size with greater than 50% respiratory variability, suggesting right atrial pressure of 3 mmHg. IAS/Shunts: No atrial level shunt detected by color flow Doppler.  LEFT VENTRICLE PLAX 2D LVIDd:         4.20 cm   Diastology LVIDs:         2.30 cm   LV e' medial:    6.85 cm/s LV PW:         1.00 cm   LV E/e' medial:  11.5 LV IVS:        0.90 cm   LV e' lateral:   9.57 cm/s LVOT diam:     2.00 cm   LV E/e' lateral: 8.2 LV SV:         73 LV SV Index:   43 LVOT Area:     3.14 cm  RIGHT VENTRICLE RV S prime:     17.40 cm/s TAPSE (M-mode): 2.1 cm LEFT ATRIUM             Index        RIGHT ATRIUM           Index LA diam:        3.80 cm 2.28 cm/m   RA Area:     16.90 cm LA Vol (A2C):   68.8 ml 41.23 ml/m  RA Volume:   44.20 ml  26.49 ml/m LA Vol (A4C):   38.3 ml 22.95 ml/m LA Biplane Vol: 50.9 ml 30.51 ml/m  AORTIC VALVE AV Area (Vmax):    1.72 cm AV Area (Vmean):   2.05 cm AV Area (VTI):     1.92 cm AV Vmax:           192.00 cm/s AV Vmean:          106.000 cm/s AV VTI:            0.377 m AV Peak Grad:      14.7 mmHg AV Mean Grad:      6.0 mmHg LVOT Vmax:         105.00 cm/s LVOT Vmean:        69.300 cm/s LVOT VTI:          0.231 m LVOT/AV VTI ratio: 0.61 AI PHT:            778 msec  AORTA Ao Root diam: 3.30 cm MITRAL VALVE                TRICUSPID VALVE MV Area (PHT): 2.45 cm    TR Peak grad:   30.5 mmHg MV Decel Time: 310 msec    TR Vmax:        276.00 cm/s MV E velocity: 78.50 cm/s MV A velocity: 84.30 cm/s  SHUNTS MV E/A ratio:  0.93        Systemic VTI:  0.23 m                            Systemic Diam: 2.00 cm Charlton Haws MD Electronically signed by Charlton Haws MD Signature Date/Time: 04/13/2023/1:28:51 PM    Final     Scheduled Meds:  (feeding supplement) PROSource Plus  30 mL Oral BID BM   Chlorhexidine Gluconate Cloth  6 each Topical Daily   feeding supplement  1 Container Oral TID BM   levothyroxine  137 mcg Oral Q0600   midodrine  10 mg Oral TID WC   nitroGLYCERIN  1 inch Topical Q8H   sodium chloride flush  10-40 mL Intracatheter Q12H   Continuous Infusions:  sodium chloride Stopped (04/14/23 0944)   sodium chloride     heparin 1,000 Units/hr (04/15/23 0907)   norepinephrine (LEVOPHED) Adult infusion 10 mcg/min (04/15/23 0907)   vasopressin 0.02 Units/min (04/15/23 0907)    LOS: 3 days   Shon Hale M.D on 04/15/2023 at 10:41 AM  Go to [email or web address] - for contact info  [facility] - Office  [phone]  If 7PM-7AM, please contact night-coverage [email or web address] 04/15/2023, 10:41 AM

## 2023-04-15 NOTE — Plan of Care (Signed)

## 2023-04-15 NOTE — Progress Notes (Signed)
PCCM note  Persistent severe symptomatic vasoplegia in context of PE, protein malnutrition but preserved EF.  Will bring down to [facility] for further workup including SvO2 check, cortisol stim.  ?RHC need.  Resume midodrine in interim.  Myrla Halsted MD PCCM

## 2023-04-16 ENCOUNTER — Other Ambulatory Visit (HOSPITAL_COMMUNITY): Payer: Self-pay

## 2023-04-16 DIAGNOSIS — I959 Hypotension, unspecified: Secondary | ICD-10-CM | POA: Diagnosis not present

## 2023-04-16 DIAGNOSIS — I2699 Other pulmonary embolism without acute cor pulmonale: Secondary | ICD-10-CM | POA: Diagnosis not present

## 2023-04-16 LAB — BASIC METABOLIC PANEL
Anion gap: 4 — ABNORMAL LOW (ref 5–15)
Anion gap: 8 (ref 5–15)
BUN: 5 mg/dL — ABNORMAL LOW (ref 8–23)
BUN: 5 mg/dL — ABNORMAL LOW (ref 8–23)
CO2: 24 mmol/L (ref 22–32)
CO2: 22 mmol/L (ref 22–32)
Calcium: 6.6 mg/dL — ABNORMAL LOW (ref 8.9–10.3)
Calcium: 6.8 mg/dL — ABNORMAL LOW (ref 8.9–10.3)
Chloride: 97 mmol/L — ABNORMAL LOW (ref 98–111)
Chloride: 96 mmol/L — ABNORMAL LOW (ref 98–111)
Creatinine, Ser: 0.44 mg/dL (ref 0.44–1.00)
Creatinine, Ser: 0.41 mg/dL — ABNORMAL LOW (ref 0.44–1.00)
GFR, Estimated: >60 mL/min (ref >60)
GFR, Estimated: >60 mL/min (ref >60)
Glucose, Bld: 96 mg/dL (ref 70–99)
Glucose, Bld: 105 mg/dL — ABNORMAL HIGH (ref 70–99)
Potassium: 3.4 mmol/L — ABNORMAL LOW (ref 3.5–5.1)
Potassium: 3.6 mmol/L (ref 3.5–5.1)
Sodium: 125 mmol/L — ABNORMAL LOW (ref 135–145)
Sodium: 126 mmol/L — ABNORMAL LOW (ref 135–145)

## 2023-04-16 LAB — HEPARIN LEVEL (UNFRACTIONATED): Heparin Unfractionated: 0.46 IU/mL (ref 0.30–0.70)

## 2023-04-16 LAB — GLUCOSE, CAPILLARY
Glucose-Capillary: 90 mg/dL (ref 70–99)
Glucose-Capillary: 149 mg/dL — ABNORMAL HIGH (ref 70–99)
Glucose-Capillary: 153 mg/dL — ABNORMAL HIGH (ref 70–99)
Glucose-Capillary: 106 mg/dL — ABNORMAL HIGH (ref 70–99)
Glucose-Capillary: 98 mg/dL (ref 70–99)
Glucose-Capillary: 95 mg/dL (ref 70–99)

## 2023-04-16 LAB — CBC
HCT: 26 % — ABNORMAL LOW (ref 36.0–46.0)
Hemoglobin: 8.5 g/dL — ABNORMAL LOW (ref 12.0–15.0)
MCH: 30.7 pg (ref 26.0–34.0)
MCHC: 32.7 g/dL (ref 30.0–36.0)
MCV: 93.9 fL (ref 80.0–100.0)
Platelets: 179 10*3/uL (ref 150–400)
RBC: 2.77 MIL/uL — ABNORMAL LOW (ref 3.87–5.11)
RDW: 17.8 % — ABNORMAL HIGH (ref 11.5–15.5)
WBC: 7.3 10*3/uL (ref 4.0–10.5)
nRBC: 0 % (ref 0.0–0.2)

## 2023-04-16 LAB — URINALYSIS, ROUTINE W REFLEX MICROSCOPIC
Bilirubin Urine: NEGATIVE
Glucose, UA: NEGATIVE mg/dL
Hgb urine dipstick: NEGATIVE
Ketones, ur: NEGATIVE mg/dL
Nitrite: NEGATIVE
Protein, ur: NEGATIVE mg/dL
Specific Gravity, Urine: 1.019 (ref 1.005–1.030)
pH: 6 (ref 5.0–8.0)

## 2023-04-16 LAB — CULTURE, BLOOD (ROUTINE X 2): Special Requests: ADEQUATE

## 2023-04-16 LAB — PROCALCITONIN: Procalcitonin: 0.13 ng/mL

## 2023-04-16 MED ORDER — ORAL CARE MOUTH RINSE: 15.0000 mL | OROMUCOSAL | Status: DC | PRN

## 2023-04-16 MED ORDER — ADULT MULTIVITAMIN W/MINERALS CH
1.0000 | ORAL_TABLET | Freq: Two times a day (BID) | ORAL | Status: DC
  Administered 2023-04-16 – 2023-04-26 (×21): 1 via ORAL
  Filled 2023-04-16 (×21): qty 1

## 2023-04-16 MED ORDER — CALCIUM CARBONATE ANTACID 500 MG PO CHEW
200.0000 mg | CHEWABLE_TABLET | Freq: Three times a day (TID) | ORAL | Status: DC
  Administered 2023-04-16 – 2023-04-26 (×31): 200 mg via ORAL
  Filled 2023-04-16 (×33): qty 1

## 2023-04-16 MED ORDER — POTASSIUM CHLORIDE CRYS ER 20 MEQ PO TBCR
40.0000 meq | EXTENDED_RELEASE_TABLET | Freq: Once | ORAL | Status: AC
  Administered 2023-04-16: 40 meq via ORAL
  Filled 2023-04-16: qty 2

## 2023-04-16 NOTE — Progress Notes (Signed)
[facility] ADULT ICU REPLACEMENT PROTOCOL   The patient does apply for the [facility] Adult ICU Electrolyte Replacment Protocol based on the criteria listed below:   1.Exclusion criteria: TCTS, ECMO, Dialysis, and Myasthenia Gravis patients 2. Is GFR >/= 30 ml/min? Yes.    Patient's GFR today is >60 3. Is SCr </= 2? Yes.   Patient's SCr is 0.44 mg/dL 4. Did SCr increase >/= 0.5 in 24 hours? No. 5.Pt's weight >40kg  Yes.   6. Abnormal electrolyte(s): K  7. Electrolytes replaced per protocol 8.  Call MD STAT for K+ </= 2.5, Phos </= 1, or Mag </= 1 Physician:  Forbes Cellar [facility] 04/16/2023 5:26 AM

## 2023-04-16 NOTE — Progress Notes (Signed)
ANTICOAGULATION CONSULT NOTE -   Pharmacy Consult for heparin Indication: pulmonary embolus  No Known Allergies  Patient Measurements: Height: 5\' 5"  (165.1 cm) Weight: 63.4 kg (139 lb 12.4 oz) IBW/kg (Calculated) : 57 Heparin Dosing Weight: 60.8 kg  Vital Signs: Temp: 97.8 F (36.6 C) (07/01 0739) Temp Source: Oral (07/01 0739) BP: 75/54 (07/01 0730) Pulse Rate: 63 (07/01 0745)  Labs: Recent Labs    04/14/23 0421 04/14/23 0810 04/15/23 0530 04/16/23 0351  HGB 9.9*  --  10.2* 8.5*  HCT 30.2*  --  31.4* 26.0*  PLT 205  --  237 179  HEPARINUNFRC  --  0.37 0.50 0.46  CREATININE 0.55  --  0.58 0.44     Estimated Creatinine Clearance: 57.2 mL/min (by C-G formula based on SCr of 0.44 mg/dL).   Medical History: Past Medical History:  Diagnosis Date   Aortic aneurysm (HCC)    Arthritis    H/O gastric bypass    Hiatal hernia    Hypothyroidism    Small bowel obstruction (HCC)     Medications:  Medications Prior to Admission  Medication Sig Dispense Refill Last Dose   acetaminophen (TYLENOL) 500 MG tablet Take 1,000 mg by mouth every 8 (eight) hours as needed for moderate pain.   Past Week   Calcium Carb-Cholecalciferol (CALCIUM 600 + D) 600-200 MG-UNIT TABS Take 1 tablet by mouth every morning.   Past Week   Cholecalciferol (VITAMIN D-3 PO) Take 1 capsule by mouth daily.   Past Week   ferrous sulfate 325 (65 FE) MG tablet Take 325 mg by mouth every morning.   Past Week   furosemide (LASIX) 80 MG tablet Take 1 tablet (80 mg total) by mouth daily. 90 tablet 3 Past Week   levothyroxine (SYNTHROID, LEVOTHROID) 137 MCG tablet Take 137 mcg by mouth daily before breakfast.   04/12/2023   Multiple Vitamin (MULTIVITAMIN WITH MINERALS) TABS tablet Take 1 tablet by mouth every morning.   04/12/2023   ondansetron (ZOFRAN-ODT) 4 MG disintegrating tablet Take 1 tablet (4 mg total) by mouth every 6 (six) hours as needed for nausea. 20 tablet 0 Past Week   oxyCODONE (OXY IR/ROXICODONE)  5 MG immediate release tablet Take 1 tablet (5 mg total) by mouth every 4 (four) hours as needed for severe pain or breakthrough pain. 15 tablet 0 Past Week   potassium chloride SA (KLOR-CON M) 20 MEQ tablet Take 1 tablet (20 mEq total) by mouth daily. 90 tablet 3 04/12/2023   zolpidem (AMBIEN) 5 MG tablet Take 1 tablet (5 mg total) by mouth at bedtime as needed for sleep. 30 tablet 0 Past Month    Assessment: Pharmacy consulted to dose heparin in patient with pulmonary embolism.  Patient is not on anticoagulation prior to admission.  HL 0.46- therapeutic Hgb 8.5 / Plt 179   Goal of Therapy:  Heparin level 0.3-0.7 units/ml Monitor platelets by anticoagulation protocol: Yes   Plan:  Continue heparin infusion at 1000 units/hr Check anti-Xa level daily Continue to monitor H&H and platelets  F/u transition to PO therapy as able   Calton Dach, PharmD Clinical Pharmacist 04/16/2023 7:56 AM

## 2023-04-16 NOTE — TOC Benefit Eligibility Note (Signed)
Pharmacy Patient Advocate Encounter  Insurance verification completed.    The patient is insured through [employer] Medicare Part D  Ran test claim for Eliquis 5 mg and the current 30 day co-pay is $30.00.   This test claim was processed through [facility]- copay amounts may vary at other pharmacies due to pharmacy/plan contracts, or as the patient moves through the different stages of their insurance plan.    Roland Earl, CPHT Pharmacy Patient Advocate Specialist [facility] Patient Advocate Team Direct Number: [phone]  Fax: [phone]

## 2023-04-16 NOTE — Discharge Instructions (Addendum)
Nutrition Post Hospital Stay Proper nutrition can help your body recover from illness and injury.   Foods and beverages high in protein, vitamins, and minerals help rebuild muscle loss, promote healing, & reduce fall risk.   In addition to eating healthy foods, a nutrition shake is an easy, delicious way to get the nutrition you need during and after your hospital stay  It is recommended that you continue to drink 2 bottles per day of: Boost Breeze/Ensure clear or Carnation Instant Breakfast for at least 1 month (30 days) after your hospital stay   Tips for adding a nutrition shake into your routine: As allowed, drink one with vitamins or medications instead of water or juice Enjoy one as a tasty mid-morning or afternoon snack Drink cold or make a milkshake out of it Drink one instead of milk with cereal or snacks Use as a coffee creamer   Available at the following grocery stores and pharmacies:           * Karin Golden * [employer] * [employer]  * [employer]          * [employer] * [employer]  * [facility]      * Target  * [employer]   * [facility]  * [employer]   * [facility] [phone]            For COUPONS visit: www.ensure.com/join or RoleLink.com.br   Suggested Substitutions Ensure Plus = Boost Plus = Carnation Breakfast Essentials = Boost Compact Ensure Active Clear = Boost Breeze Glucerna Shake = Boost Glucose Control = Carnation Breakfast Essentials SUGAR FREE    High-Calorie, High-Protein Nutrition Therapy (2021)  A high-calorie, high-protein diet has been recommended to you. Your registered dietitian nutritionist (RDN) may have recommended this diet because you are having difficulty eating enough calories throughout the day, you have lost weight, and/or you need to add protein to your diet. Sometimes you may not feel like eating, even if you know the importance of good nutrition. The recommendations in this handout can help you with the  following: Regaining your strength and energy Keeping your body healthy Healing and recovering from surgery or illness and fighting infection  Tips: Schedule Your Meals and Snacks Several small meals and snacks are often better tolerated and digested than large meals. Strategies Plan to eat 3 meals and 3 snacks daily. Experiment with timing meals to find out when you have a larger appetite. Appetite may be greatest in the morning after not eating all night so you may prefer to eat your larger meals and snacks in the morning and at lunch. Breakfast-type foods are often better tolerated so eat foods such as eggs, pancakes, waffles and cereal for any meal or snack. Carry snacks with you so you are prepared to eat every 2 to 3 hours. Determine what works best for you if your body's cues for feeling hungry or full are not working. Eat a small meal or snack even if you don't feel hungry. Set a timer to remind you when it is time to eat. Take a walk before you eat (with health care provider's approval). Light or moderate physical activity can help you maintain muscle and increase your appetite.  Make Eating Enjoyable Taking steps to make the experience enjoyable may help to increase your interest in eating and improve your appetite. Strategies: Eat with others whenever possible. Include your favorite foods to make meals more enjoyable. Try new foods. Save your beverage for the end  of the meal so that you have more room for food before you get full.  Add Calories to Your Meals and Snacks Try adding calorie-dense foods so that each bite provides more nutrition. Strategies Drink milk, chocolate milk, soy milk, or smoothies instead of low-calorie beverages such as diet drinks or water. Cook with milk or soy milk instead of water when making dishes such as hot cereal, cocoa, or pudding. Add jelly, jam, honey, butter or margarine to bread and crackers. Add jam or fruit to ice cream and as a  topping over cake. Mix dried fruit, nuts, granola, honey, or dry cereal with yogurt or hot cereals. Enjoy snacks such as milkshakes, smoothies, pudding, ice cream, or custard. Blend a fruit smoothie of a banana, frozen berries, milk or soy milk, and 1 tablespoon nonfat powdered milk or protein powder.  Add Protein to Your Meals and Snacks Choose at least one protein food at each meal and snack to increase your daily intake. Strategies Add  cup nonfat dry milk powder or protein powder to make a high-protein milk to drink or to use in recipes that call for milk. Vanilla or peppermint extract or unsweetened cocoa powder could help to boost the flavor. Add hard-cooked eggs, leftover meat, grated cheese, canned beans or tofu to noodles, rice, salads, sandwiches, soups, casseroles, pasta, tuna and other mixed dishes. Add powdered milk or protein powder to hot cereals, meatloaf, casseroles, scrambled eggs, sauces, cream soups, and shakes. Add beans and lentils to salads, soups, casseroles, and vegetable dishes. Eat cottage cheese or yogurt, especially Greek yogurt, with fruit as a snack or dessert. Eat peanut or other nut butters on crackers, bread, toast, waffles, apples, bananas or celery sticks. Add it to milkshakes, smoothies, or desserts. Consider a ready-made protein shake. Your RDN will make recommendations.  Add Fats to Your Meals and Snacks Try adding fats to your meals and snacks. Fat provides more calories in fewer bites than carbohydrate or protein and adds flavors to your foods. Strategies Snack on nuts and seeds or add them to foods like salads, pasta, cereals, yogurt, and ice cream.  Saut or stir-fry vegetables, meats, chicken, fish or tofu in olive or canola oil.  Add olive oil, other vegetable oils, butter or margarine to soups, vegetables, potatoes, cooked cereal, rice, pasta, bread, crackers, pancakes, or waffles. Snack on olives or add to pasta, pizza, or salad. Add avocado or  guacamole to your salads, sandwiches, and other entrees. Include fatty fish such as salmon in your weekly meal plan. For general food safety tips, especially for clients with immunocompromised conditions, ask your RDN for the Food Safety Nutrition Therapy handout.  Small Meal and Snack Ideas These snacks and meals are recommended when you have to eat but aren't necessarily hungry.  They are good choices because they are high in protein and high in calories.  2 graham crackers 2 tablespoons peanut or other nut butter 1 cup milk 2 slices whole wheat toast topped with:  avocado, mashed Seasoning of your choice   cup Greek yogurt  cup fruit  cup granola 2 deviled egg halves 5 whole wheat crackers  1 cup cream of tomato soup  grilled cheese sandwich 1 toasted waffle topped with: 2 tablespoons peanut or nut butter 1 tablespoon jam  Trail mix made with:  cup nuts  cup dried fruit  cup cold cereal, any variety  cup oatmeal or cream of wheat cereal 1 tablespoon peanut or nut butter  cup diced fruit  High-Calorie, High-Protein Sample 1-Day Menu View Nutrient Info Breakfast 1 egg, scrambled 1 ounce cheddar cheese 1 English muffin, whole wheat 1 tablespoon margarine 1 tablespoon jam  cup orange juice, fortified with calcium and vitamin D  Morning Snack 1 tablespoon peanut butter 1 banana 1 cup 1% milk  Lunch Tuna salad sandwich made with: 2 slices bread, whole wheat 3 ounces tuna mixed with: 1 tablespoon mayonnaise  cup pudding  Afternoon Snack  cup hummus  cup carrots 1 pita  Evening Meal Enchilada casserole made with: 2 corn tortillas 3 ounces ground beef, cooked  cup black beans, cooked  cup corn, cooked 1 ounce grated cheddar cheese  cup enchilada sauce  avocado, sliced, topping for enchilada 1 tablespoon sour cream, topping for enchilada Salad:  cup lettuce, shredded  cup tomatoes, chopped, for salad 1 tablespoon olive oil and vinegar dressing, for  salad  Evening Snack  cup Greek yogurt  cup blueberries  cup granola     Information on my medicine - ELIQUIS (apixaban)  Why was Eliquis prescribed for you? Eliquis was prescribed to treat blood clots that may have been found in the veins of your legs (deep vein thrombosis) or in your lungs (pulmonary embolism) and to reduce the risk of them occurring again.  What do You need to know about Eliquis ? The dose is ONE 5 mg tablet taken TWICE daily.  Eliquis may be taken with or without food.   Try to take the dose about the same time in the morning and in the evening. If you have difficulty swallowing the tablet whole please discuss with your pharmacist how to take the medication safely.  Take Eliquis exactly as prescribed and DO NOT stop taking Eliquis without talking to the doctor who prescribed the medication.  Stopping may increase your risk of developing a new blood clot.  Refill your prescription before you run out.  After discharge, you should have regular check-up appointments with your healthcare provider that is prescribing your Eliquis.    What do you do if you miss a dose? If a dose of ELIQUIS is not taken at the scheduled time, take it as soon as possible on the same day and twice-daily administration should be resumed. The dose should not be doubled to make up for a missed dose.  Important Safety Information A possible side effect of Eliquis is bleeding. You should call your healthcare provider right away if you experience any of the following: Bleeding from an injury or your nose that does not stop. Unusual colored urine (red or dark brown) or unusual colored stools (red or black). Unusual bruising for unknown reasons. A serious fall or if you hit your head (even if there is no bleeding).  Some medicines may interact with Eliquis and might increase your risk of bleeding or clotting while on Eliquis. To help avoid this, consult your healthcare provider or  pharmacist prior to using any new prescription or non-prescription medications, including herbals, vitamins, non-steroidal anti-inflammatory drugs (NSAIDs) and supplements.  This website has more information on Eliquis (apixaban): http://www.eliquis.com/eliquis/home

## 2023-04-16 NOTE — Progress Notes (Signed)
NAME:  Kayla Dean, MRN:  [id number], DOB:  05/02/1951, LOS: 4 ADMISSION DATE:  04/12/2023, CONSULTATION DATE:  04/15/2023 REFERRING MD:  [facility] , CHIEF COMPLAINT:  shock    History of Present Illness:   72 yo FM, PMH h/o gastric bypass, hiatal hernia, hypothyroid. Has a history of gastric bypass, s/p exploratory laprotomy, reduction, of previous incarcerated hernia. CT shows   Pertinent  Medical History   Past Medical History:  Diagnosis Date   Aortic aneurysm (HCC)    Arthritis    H/O gastric bypass    Hiatal hernia    Hypothyroidism    Small bowel obstruction (HCC)     Echo: IMPRESSIONS   1. Left ventricular ejection fraction, by estimation, is 60 to 65%. The  left ventricle has normal function. The left ventricle has no regional  wall motion abnormalities. Left ventricular diastolic parameters were  normal.   2. Right ventricular systolic function is normal. The right ventricular  size is normal.   3. Left atrial size was mildly dilated.   4. The mitral valve is abnormal. Trivial mitral valve regurgitation. No  evidence of mitral stenosis.   5. The aortic valve is tricuspid. There is moderate calcification of the  aortic valve. There is moderate thickening of the aortic valve. Aortic  valve regurgitation is mild. Aortic valve sclerosis is present, with no  evidence of aortic valve stenosis.   6. The inferior vena cava is normal in size with greater than 50%  respiratory variability, suggesting right atrial pressure of 3 mmHg.    Significant Hospital Events: Including procedures, antibiotic start and stop dates in addition to other pertinent events   7/1-still requiring pressors  Interim History / Subjective:  She continues to improve, still requiring pressors Tolerating orally Denies any abdominal pain or discomfort Objective   Blood pressure (!) 75/54, pulse (!) 59, temperature 97.8 F (36.6 C), temperature source Oral, resp. rate 17, height 5\' 5"  (1.651 m),  weight 63.4 kg, SpO2 96 %.        Intake/Output Summary (Last 24 hours) at 04/16/2023 0832 Last data filed at 04/16/2023 0800 Gross per 24 hour  Intake 1246.32 ml  Output 400 ml  Net 846.32 ml   Filed Weights   04/14/23 0348 04/15/23 0539 04/15/23 1445  Weight: 58 kg 58 kg 63.4 kg    Examination: General: Elderly, awake alert interactive HENT: Moist oral mucosa Lungs: Clear breath sounds bilaterally Cardiovascular: S1-S2 appreciated with no murmur Abdomen: Soft, bowel sounds appreciated Extremities: Trace lower extremity edema-improved Neuro: Alert and oriented x 3, moving all extremities GU: Deferred  Resolved Hospital Problem list    Assessment & Plan:   Hypotension -Appears multifactorial -Concern for sepsis however no fever, no leukocytosis -Possible colitis on CT for which she continues on antibiotic therapy -History of chronic mesenteric ischemia  -Follow cultures -Continue empiric antibiotics -She continues on midodrine  .  Subsegmental right sided pulmonary embolism -Will continue anticoagulation at present -Unlikely that this is contributing hemodynamically  .  Hypoalbuminemia -Likely secondary to chronically poor p.o. intake -Optimize nutritional supplementation, Prosource added, boost added -Dietary consultation  .  Hypothyroidism -Continue home levothyroxine  Best Practice (right click and "Reselect all SmartList Selections" daily)   Diet/type: Regular consistency (see orders) DVT prophylaxis: systemic heparin GI prophylaxis: N/A Lines: Central line Foley:  N/A Code Status:  full code Last date of multidisciplinary goals of care discussion [met with patient and patient's daughter at bedside.]  Labs   CBC: Recent  Labs  Lab 04/12/23 1730 04/13/23 0458 04/14/23 0421 04/15/23 0530 04/16/23 0351  WBC 12.2* 7.4 10.1 8.0 7.3  HGB 12.9 9.6* 9.9* 10.2* 8.5*  HCT 40.3 29.9* 30.2* 31.4* 26.0*  MCV 96.2 97.4 95.3 95.4 93.9  PLT 262 163 205 237  179    Basic Metabolic Panel: Recent Labs  Lab 04/12/23 1730 04/13/23 0458 04/14/23 0421 04/15/23 0530 04/16/23 0351  NA 136 135 134* 131* 125*  K 3.9 3.7 3.3* 3.8 3.4*  CL 98 102 101 101 97*  CO2 29 28 27 23 24   GLUCOSE 100* 79 89 149* 96  BUN 14 15 9 10  5*  CREATININE 0.70 0.65 0.55 0.58 0.44  CALCIUM 7.7* 6.8* 6.7* 6.9* 6.6*  MG  --  2.1  --   --   --   PHOS  --  3.4  --   --   --    GFR: Estimated Creatinine Clearance: 57.2 mL/min (by C-G formula based on SCr of 0.44 mg/dL). Recent Labs  Lab 04/13/23 0458 04/14/23 0421 04/15/23 0530 04/15/23 1134 04/15/23 1649 04/16/23 0351  PROCALCITON  --   --   --   --  0.12 0.13  WBC 7.4 10.1 8.0  --   --  7.3  LATICACIDVEN  --   --   --  3.5*  --   --     Liver Function Tests: Recent Labs  Lab 04/12/23 1730 04/13/23 0458 04/14/23 0421  AST 19 13* 15  ALT 29 19 17   ALKPHOS 129* 86 84  BILITOT 0.7 0.8 0.6  PROT 4.9* 3.6* 3.6*  ALBUMIN 1.5* <1.5* <1.5*   No results for input(s): "LIPASE", "AMYLASE" in the last 168 hours. No results for input(s): "AMMONIA" in the last 168 hours.  ABG    Component Value Date/Time   O2SAT 97 04/15/2023 1141     Coagulation Profile: No results for input(s): "INR", "PROTIME" in the last 168 hours.  Cardiac Enzymes: No results for input(s): "CKTOTAL", "CKMB", "CKMBINDEX", "TROPONINI" in the last 168 hours.  HbA1C: Hgb A1c MFr Bld  Date/Time Value Ref Range Status  08/13/2018 04:01 AM 4.7 (L) 4.8 - 5.6 % Final    Comment:    (NOTE) Pre diabetes:          5.7%-6.4% Diabetes:              >6.4% Glycemic control for   <7.0% adults with diabetes     CBG: Recent Labs  Lab 04/15/23 1551 04/15/23 1937 04/15/23 2332 04/16/23 0323 04/16/23 0737  GLUCAP 119* 127* 89 90 149*    Review of Systems:   Critically ill  Past Medical History:  She,  has a past medical history of Aortic aneurysm (HCC), Arthritis, H/O gastric bypass, Hiatal hernia, Hypothyroidism, and Small  bowel obstruction (HCC).   Surgical History:   Past Surgical History:  Procedure Laterality Date   ABDOMINAL HYSTERECTOMY     CHOLECYSTECTOMY     COLON SURGERY     HERNIA REPAIR     LAPAROTOMY N/A 02/02/2023   Procedure: EXPLORATORY LAPAROTOMY, [name], EXPLANT OF ABDOMINAL MESH;  Surgeon: Lucretia Roers, MD;  Location: [facility];  Service: General;  Laterality: N/A;   REPLACEMENT TOTAL KNEE Right    ROTATOR CUFF REPAIR Right    TOTAL HIP ARTHROPLASTY Right      Social History:   reports that she has never smoked. She has never used smokeless tobacco. She reports that she does not  drink alcohol and does not use drugs.   Family History:  Her family history includes CAD in her brother and father.   Allergies No Known Allergies   The patient is critically ill with multiple organ systems failure and requires high complexity decision making for assessment and support, frequent evaluation and titration of therapies, application of advanced monitoring technologies and extensive interpretation of multiple databases. Critical Care Time devoted to patient care services described in this note independent of APP/resident time (if applicable)  is 32 minutes.   Virl Diamond MD [facility] Personal pager: See Amion If unanswered, please page CCM On-call: #[phone]

## 2023-04-16 NOTE — Progress Notes (Signed)
Initial Nutrition Assessment  DOCUMENTATION CODES:   Non-severe (moderate) malnutrition in context of chronic illness  INTERVENTION:  Liberalize diet to regular  Discontinue ProSource  Boost Breeze po TID, each supplement provides 250 kcal and 9 grams of protein Magic cup TID with meals, each supplement provides 290 kcal and 9 grams of protein Mighty Shake once daily with lunch, each supplement provides 330 kcals and 9 grams of protein Carnation Breakfast Essentials BID, each packet mixed with 8 ounces of 2% milk provides 13 grams of protein and 260 calories. MVI BID and TUMS TID d/t history of gastric bypass "High Calorie, High Protein Nutrition Therapy" handout added to AVS  NUTRITION DIAGNOSIS:   Moderate Malnutrition related to chronic illness (gastric bypass, incarcerated hernia) as evidenced by mild fat depletion, moderate muscle depletion, mild muscle depletion, edema.  GOAL:   Patient will meet greater than or equal to 90% of their needs  MONITOR:   PO intake, Supplement acceptance, Labs, Weight trends  REASON FOR ASSESSMENT:   Consult Assessment of nutrition requirement/status (Prior gastric surgery, hypoalbuminemia, hypotension. Low albumin. Likes wild berry boosts but cannot find them in store for at home use. Needs education in high protein meals. Thanks!)  ASSESSMENT:   Pt admitted with shock d/t acute pulmonary embolism. PMH significant for gastric bypass, hiatal hernia, s/p exlap and reduction of previous incarcerated hernia, hypothyroidism.  Continues with medical management of hypotension and possible colitis.   Spoke with pt and her daughter at bedside. Pt states that she had a history of gastric bypass, though did not specific type. 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 Pt recalls having been on low residue diet for the past several weeks d/t thickening of bowels. She endorses a decreased appetite d/t this. Chronically since her bypass  surgery, she only consumes small portions every 2 hours.   She has had ensure protein supplements as home but does not enjoy these and prefers boost breeze and anything berry flavored. Pt endorses drinking about 1 per day during admission. Observed 1 on bedside table about 75% consumed at that time. Pt also continues with bariatric vitamin regimen at home.   Meal completions: 6/30: 40% breakfast, 50% lunch 7/1: 25% breakfast  Pt states that her recent weight has fluctuated due to edema. Wishing for updated weight now that some swelling has decreased. Per documented weight history, pt's weight has overall declined within the last 2 months, however given presence of edema and noted fluctuations, it is difficult to determine degree of true dry weight. Will continue to monitor throughout admission.   Edema: mild generalized, moderate BUE/BLE  Medications: reviewed Drips: Zosyn Vaso @ 31ml/hr  Labs: sodium 125, potassium 3.4, BUN 5, corrected Ca 8.6, anion gap 4, CBG's 89-149 x24 hours  NUTRITION - FOCUSED PHYSICAL EXAM:  Flowsheet Row Most Recent Value  Orbital Region No depletion  Upper Arm Region Mild depletion  Thoracic and Lumbar Region No depletion  Buccal Region Mild depletion  Temple Region Moderate depletion  Clavicle Bone Region Moderate depletion  Clavicle and Acromion Bone Region Moderate depletion  Scapular Bone Region Mild depletion  Dorsal Hand Mild depletion  Patellar Region No depletion  Anterior Thigh Region Mild depletion  Posterior Calf Region No depletion  Edema (RD Assessment) Moderate  Hair Reviewed  Eyes Reviewed  Mouth Reviewed  Skin Reviewed  Nails Reviewed       Diet Order:   Diet Order             Diet  regular Room service appropriate? Yes; Fluid consistency: Thin  Diet effective now                   EDUCATION NEEDS:   Education needs have been addressed  Skin:  Skin Assessment: Reviewed RN Assessment  Last BM:  7/1 (type 7  medium)  Height:   Ht Readings from Last 1 Encounters:  04/13/23 5\' 5"  (1.651 m)    Weight:   Wt Readings from Last 1 Encounters:  04/15/23 63.4 kg   BMI:  Body mass index is 23.26 kg/m.  Estimated Nutritional Needs:   Kcal:  1600-1800  Protein:  85-100g  Fluid:  >/=1.6L  Drusilla Kanner, RDN, LDN Clinical Nutrition

## 2023-04-17 DIAGNOSIS — E871 Hypo-osmolality and hyponatremia: Secondary | ICD-10-CM | POA: Diagnosis not present

## 2023-04-17 DIAGNOSIS — E440 Moderate protein-calorie malnutrition: Secondary | ICD-10-CM | POA: Insufficient documentation

## 2023-04-17 DIAGNOSIS — I2699 Other pulmonary embolism without acute cor pulmonale: Secondary | ICD-10-CM | POA: Diagnosis not present

## 2023-04-17 LAB — BASIC METABOLIC PANEL
Anion gap: 6 (ref 5–15)
Anion gap: 8 (ref 5–15)
BUN: 5 mg/dL — ABNORMAL LOW (ref 8–23)
BUN: <5 mg/dL — ABNORMAL LOW (ref 8–23)
CO2: 23 mmol/L (ref 22–32)
CO2: 22 mmol/L (ref 22–32)
Calcium: 6.8 mg/dL — ABNORMAL LOW (ref 8.9–10.3)
Calcium: 6.9 mg/dL — ABNORMAL LOW (ref 8.9–10.3)
Chloride: 96 mmol/L — ABNORMAL LOW (ref 98–111)
Chloride: 95 mmol/L — ABNORMAL LOW (ref 98–111)
Creatinine, Ser: 0.5 mg/dL (ref 0.44–1.00)
Creatinine, Ser: 0.61 mg/dL (ref 0.44–1.00)
GFR, Estimated: >60 mL/min (ref >60)
GFR, Estimated: >60 mL/min (ref >60)
Glucose, Bld: 108 mg/dL — ABNORMAL HIGH (ref 70–99)
Glucose, Bld: 186 mg/dL — ABNORMAL HIGH (ref 70–99)
Potassium: 3.3 mmol/L — ABNORMAL LOW (ref 3.5–5.1)
Potassium: 4.1 mmol/L (ref 3.5–5.1)
Sodium: 125 mmol/L — ABNORMAL LOW (ref 135–145)
Sodium: 125 mmol/L — ABNORMAL LOW (ref 135–145)

## 2023-04-17 LAB — CBC
HCT: 25.3 % — ABNORMAL LOW (ref 36.0–46.0)
HCT: 29.7 % — ABNORMAL LOW (ref 36.0–46.0)
Hemoglobin: 8.3 g/dL — ABNORMAL LOW (ref 12.0–15.0)
Hemoglobin: 9.6 g/dL — ABNORMAL LOW (ref 12.0–15.0)
MCH: 31.6 pg (ref 26.0–34.0)
MCH: 31.2 pg (ref 26.0–34.0)
MCHC: 32.8 g/dL (ref 30.0–36.0)
MCHC: 32.3 g/dL (ref 30.0–36.0)
MCV: 96.2 fL (ref 80.0–100.0)
MCV: 96.4 fL (ref 80.0–100.0)
Platelets: 163 10*3/uL (ref 150–400)
Platelets: 223 10*3/uL (ref 150–400)
RBC: 2.63 MIL/uL — ABNORMAL LOW (ref 3.87–5.11)
RBC: 3.08 MIL/uL — ABNORMAL LOW (ref 3.87–5.11)
RDW: 18.2 % — ABNORMAL HIGH (ref 11.5–15.5)
RDW: 17.9 % — ABNORMAL HIGH (ref 11.5–15.5)
WBC: 6.5 10*3/uL (ref 4.0–10.5)
WBC: 4.9 10*3/uL (ref 4.0–10.5)
nRBC: 0 % (ref 0.0–0.2)
nRBC: 0 % (ref 0.0–0.2)

## 2023-04-17 LAB — SODIUM, URINE, RANDOM: Sodium, Ur: 136 mmol/L

## 2023-04-17 LAB — GLUCOSE, CAPILLARY
Glucose-Capillary: 100 mg/dL — ABNORMAL HIGH (ref 70–99)
Glucose-Capillary: 111 mg/dL — ABNORMAL HIGH (ref 70–99)
Glucose-Capillary: 140 mg/dL — ABNORMAL HIGH (ref 70–99)

## 2023-04-17 LAB — OSMOLALITY, URINE: Osmolality, Ur: 536 mOsm/kg (ref 300–900)

## 2023-04-17 LAB — OSMOLALITY: Osmolality: 260 mOsm/kg — ABNORMAL LOW (ref 275–295)

## 2023-04-17 LAB — HEPARIN LEVEL (UNFRACTIONATED): Heparin Unfractionated: 0.51 IU/mL (ref 0.30–0.70)

## 2023-04-17 LAB — MAGNESIUM
Magnesium: 1.5 mg/dL — ABNORMAL LOW (ref 1.7–2.4)
Magnesium: 1.9 mg/dL (ref 1.7–2.4)

## 2023-04-17 LAB — CORTISOL: Cortisol, Plasma: 18.4 ug/dL

## 2023-04-17 MED ORDER — APIXABAN 5 MG PO TABS
10.0000 mg | ORAL_TABLET | Freq: Two times a day (BID) | ORAL | Status: AC
  Administered 2023-04-17 – 2023-04-23 (×14): 10 mg via ORAL
  Filled 2023-04-17 (×14): qty 2

## 2023-04-17 MED ORDER — APIXABAN 5 MG PO TABS
5.0000 mg | ORAL_TABLET | Freq: Two times a day (BID) | ORAL | Status: DC
  Administered 2023-04-24 – 2023-04-26 (×5): 5 mg via ORAL
  Filled 2023-04-17 (×6): qty 1

## 2023-04-17 MED ORDER — HYDROCORTISONE SOD SUC (PF) 100 MG IJ SOLR
100.0000 mg | Freq: Two times a day (BID) | INTRAMUSCULAR | Status: DC
  Administered 2023-04-17 – 2023-04-23 (×13): 100 mg via INTRAVENOUS
  Filled 2023-04-17 (×12): qty 2

## 2023-04-17 MED ORDER — MAGNESIUM SULFATE 4 GM/100ML IV SOLN
4.0000 g | Freq: Once | INTRAVENOUS | Status: AC
  Administered 2023-04-17: 4 g via INTRAVENOUS
  Filled 2023-04-17: qty 100

## 2023-04-17 MED ORDER — POTASSIUM CHLORIDE CRYS ER 20 MEQ PO TBCR
40.0000 meq | EXTENDED_RELEASE_TABLET | Freq: Once | ORAL | Status: AC
  Administered 2023-04-17: 40 meq via ORAL
  Filled 2023-04-17: qty 2

## 2023-04-17 NOTE — TOC Initial Note (Signed)
Transition of Care [facility]) - Initial/Assessment Note    Patient Details  Name: Kayla Dean MRN: [id number] Date of Birth: 01-14-1951  Transition of Care [facility]) CM/SW Contact:    Lorri Frederick, LCSW Phone Number: 04/17/2023, 11:20 AM  Clinical Narrative:        CSW met with pt and daughter Marcelino Duster, explained TOC role.  Pt from home alone.  Independent with mobility prior to admission.  Permission given to speak with 3 daughters: Layne Benton.  TOC will continue to follow for DC needs.              Barriers to Discharge: Continued Medical Work up   Patient Goals and CMS Choice            Expected Discharge Plan and Services       Living arrangements for the past 2 months: Single Family Home                                      Prior Living Arrangements/Services Living arrangements for the past 2 months: Single Family Home Lives with:: Self Patient language and need for interpreter reviewed:: Yes Do you feel safe going back to the place where you live?: Yes      Need for Family Participation in Patient Care: Yes (Comment) Care giver support system in place?: Yes (comment)   Criminal Activity/Legal Involvement Pertinent to Current Situation/Hospitalization: No - Comment as needed  Activities of Daily Living Home Assistive Devices/Equipment: None ADL Screening (condition at time of admission) Patient's cognitive ability adequate to safely complete daily activities?: Yes Is the patient deaf or have difficulty hearing?: No Does the patient have difficulty seeing, even when wearing glasses/contacts?: No Does the patient have difficulty concentrating, remembering, or making decisions?: No Patient able to express need for assistance with ADLs?: Yes Does the patient have difficulty dressing or bathing?: No Independently performs ADLs?: Yes (appropriate for developmental age) Does the patient have difficulty walking or climbing stairs?: No Weakness  of Legs: Both Weakness of Arms/Hands: None  Permission Sought/Granted Permission sought to share information with : Family Supports Permission granted to share information with : Yes, Verbal Permission Granted  Share Information with NAME: 3 daughters: Layne Benton           Emotional Assessment Appearance:: Appears stated age Attitude/Demeanor/Rapport: Engaged Affect (typically observed): Appropriate, Pleasant Orientation: : Oriented to Self, Oriented to Place, Oriented to  Time, Oriented to Situation      Admission diagnosis:  Peripheral edema [R60.0] Patient Active Problem List   Diagnosis Date Noted   Malnutrition of moderate degree 04/17/2023   Hypotension 04/13/2023   Elevated brain natriuretic peptide (BNP) level 04/13/2023   Hypoalbuminemia due to protein-calorie malnutrition (HCC) 04/13/2023   Acute pulmonary embolism (HCC) 04/13/2023   Bilateral lower extremity edema 04/12/2023   Peripheral edema 04/12/2023   Abdominal pain, right lateral 02/27/2023   Generalized body aches 02/15/2023   Shock liver 02/03/2023   H/O gastric bypass 02/02/2023   Incarcerated ventral hernia 02/02/2023   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 [place] [address] Phone: [phone] Fax: [phone]     Social Determinants of Health (  SDOH) Social History: SDOH Screenings   Food Insecurity: No Food Insecurity (04/13/2023)  Housing: Low Risk  (04/13/2023)  Transportation Needs: No Transportation Needs (04/13/2023)  Utilities: Not At Risk (04/13/2023)  Tobacco Use: Low Risk  (04/12/2023)   SDOH Interventions:     Readmission Risk Interventions     No data to display

## 2023-04-17 NOTE — Progress Notes (Signed)
ANTICOAGULATION CONSULT NOTE -   Pharmacy Consult for heparin>eliquis Indication: pulmonary embolus  No Known Allergies  Patient Measurements: Height: 5\' 5"  (165.1 cm) Weight: 63.4 kg (139 lb 12.4 oz) IBW/kg (Calculated) : 57 Heparin Dosing Weight: 60.8 kg  Vital Signs: Temp: 97.9 F (36.6 C) (07/02 0753) Temp Source: Oral (07/02 0753) BP: 85/52 (07/02 0930) Pulse Rate: 49 (07/02 0930)  Labs: Recent Labs    04/15/23 0530 04/16/23 0351 04/16/23 1500 04/17/23 0200 04/17/23 0624  HGB 10.2* 8.5*  --  8.3*  --   HCT 31.4* 26.0*  --  25.3*  --   PLT 237 179  --  163  --   HEPARINUNFRC 0.50 0.46  --   --  0.51  CREATININE 0.58 0.44 0.41* 0.50  --      Estimated Creatinine Clearance: 57.2 mL/min (by C-G formula based on SCr of 0.5 mg/dL).   Medical History: Past Medical History:  Diagnosis Date   Aortic aneurysm (HCC)    Arthritis    H/O gastric bypass    Hiatal hernia    Hypothyroidism    Small bowel obstruction [facility])    Assessment: Pharmacy consulted to dose heparin in patient with pulmonary embolism.  Patient is not on anticoagulation prior to admission.  Pharmacy to transition from heparin > eliquis  HL 0.51 - therapeutic CBC Hgb low/stable. Plt WNL  Goal of Therapy:  Heparin level 0.3-0.7 units/ml Monitor platelets by anticoagulation protocol: Yes   Plan:  Stop heparin infusion  Give first dose eliquis at time of heparin discontinuation Eliquis 10mg  bid x7 days then 5mg  bid thereafter --Eliquis copay $30/mo   Calton Dach, PharmD Clinical Pharmacist 04/17/2023 9:43 AM

## 2023-04-17 NOTE — Progress Notes (Signed)
NAME:  Kayla Dean, MRN:  [id number], DOB:  03/02/1951, LOS: 5 ADMISSION DATE:  04/12/2023, CONSULTATION DATE:  04/15/2023 REFERRING MD:  [facility] , CHIEF COMPLAINT:  shock    History of Present Illness:   72 yo FM, PMH h/o gastric bypass, hiatal hernia, hypothyroid. Has a history of gastric bypass, s/p exploratory laprotomy, reduction, of previous incarcerated hernia. CT shows   Pertinent  Medical History   Past Medical History:  Diagnosis Date   Aortic aneurysm (HCC)    Arthritis    H/O gastric bypass    Hiatal hernia    Hypothyroidism    Small bowel obstruction (HCC)     Echo: IMPRESSIONS   1. Left ventricular ejection fraction, by estimation, is 60 to 65%. The  left ventricle has normal function. The left ventricle has no regional  wall motion abnormalities. Left ventricular diastolic parameters were  normal.   2. Right ventricular systolic function is normal. The right ventricular  size is normal.   3. Left atrial size was mildly dilated.   4. The mitral valve is abnormal. Trivial mitral valve regurgitation. No  evidence of mitral stenosis.   5. The aortic valve is tricuspid. There is moderate calcification of the  aortic valve. There is moderate thickening of the aortic valve. Aortic  valve regurgitation is mild. Aortic valve sclerosis is present, with no  evidence of aortic valve stenosis.   6. The inferior vena cava is normal in size with greater than 50%  respiratory variability, suggesting right atrial pressure of 3 mmHg.    Significant Hospital Events: Including procedures, antibiotic start and stop dates in addition to other pertinent events   7/1-still requiring pressors 7/2 on pressors  Interim History / Subjective:  Continues to improve No overnight events Tolerating orally Denies significant abdominal pain or discomfort at present  Objective   Blood pressure 107/64, pulse 64, temperature 97.9 F (36.6 C), temperature source Oral, resp. rate 13,  height 5\' 5"  (1.651 m), weight 63.4 kg, SpO2 94 %. CVP:  [6 mmHg] 6 mmHg      Intake/Output Summary (Last 24 hours) at 04/17/2023 0756 Last data filed at 04/17/2023 0600 Gross per 24 hour  Intake 1611.8 ml  Output 200 ml  Net 1411.8 ml   Filed Weights   04/14/23 0348 04/15/23 0539 04/15/23 1445  Weight: 58 kg 58 kg 63.4 kg    Examination: General: Elderly, frail, awake alert interactive HENT: Moist oral mucosa Lungs: Clear breath sounds bilaterally Cardiovascular: S1-S2 appreciated with no murmur Abdomen: Soft, bowel sounds appreciated Extremities: Trace lower extremity edema improved, left upper extremity swelling Neuro: Alert and oriented x 3, moving all extremities GU: Deferred  Resolved Hospital Problem list    Assessment & Plan:   Hypotension -Multifactorial -Concern for sepsis with no fever or leukocytosis -CT with evidence of inflammation-on empiric antibiotics -History of chronic mesenteric ischemia -Will continue to follow blood cultures -Continue empiric antibiotics -Continue midodrine -Continue IV pressors  .  Subsegmental right sided pulmonary embolism -Will continue anticoagulation -Not contributing hemodynamically  .  Hypoalbuminemia -Chronically poor p.o. intake -Protein supplementation including Prosource, boost -Appreciate dietary support  .  Hypothyroidism -Continue home levothyroxine  .  Hyponatremia -Appears euvolemic -?  Glucocorticoid deficiency -She does have hypothyroidism-continues on Synthroid -Ordered random cortisol, plasma osmolality, urine sodium   Best Practice (right click and "Reselect all SmartList Selections" daily)   Diet/type: Regular consistency (see orders) DVT prophylaxis: systemic heparin GI prophylaxis: N/A Lines: Central line Foley:  N/A  Code Status:  full code Last date of multidisciplinary goals of care discussion [met with patient and patient's daughter at bedside.]  Labs   CBC: Recent Labs  Lab  04/13/23 0458 04/14/23 0421 04/15/23 0530 04/16/23 0351 04/17/23 0200  WBC 7.4 10.1 8.0 7.3 6.5  HGB 9.6* 9.9* 10.2* 8.5* 8.3*  HCT 29.9* 30.2* 31.4* 26.0* 25.3*  MCV 97.4 95.3 95.4 93.9 96.2  PLT 163 205 237 179 163    Basic Metabolic Panel: Recent Labs  Lab 04/13/23 0458 04/14/23 0421 04/15/23 0530 04/16/23 0351 04/16/23 1500 04/17/23 0200  NA 135 134* 131* 125* 126* 125*  K 3.7 3.3* 3.8 3.4* 3.6 3.3*  CL 102 101 101 97* 96* 96*  CO2 28 27 23 24 22 23   GLUCOSE 79 89 149* 96 105* 108*  BUN 15 9 10  5* 5* 5*  CREATININE 0.65 0.55 0.58 0.44 0.41* 0.50  CALCIUM 6.8* 6.7* 6.9* 6.6* 6.8* 6.8*  MG 2.1  --   --   --   --  1.5*  PHOS 3.4  --   --   --   --   --    GFR: Estimated Creatinine Clearance: 57.2 mL/min (by C-G formula based on SCr of 0.5 mg/dL). Recent Labs  Lab 04/14/23 0421 04/15/23 0530 04/15/23 1134 04/15/23 1649 04/16/23 0351 04/17/23 0200  PROCALCITON  --   --   --  0.12 0.13  --   WBC 10.1 8.0  --   --  7.3 6.5  LATICACIDVEN  --   --  3.5*  --   --   --     Liver Function Tests: Recent Labs  Lab 04/12/23 1730 04/13/23 0458 04/14/23 0421  AST 19 13* 15  ALT 29 19 17   ALKPHOS 129* 86 84  BILITOT 0.7 0.8 0.6  PROT 4.9* 3.6* 3.6*  ALBUMIN 1.5* <1.5* <1.5*   No results for input(s): "LIPASE", "AMYLASE" in the last 168 hours. No results for input(s): "AMMONIA" in the last 168 hours.  ABG    Component Value Date/Time   O2SAT 97 04/15/2023 1141     Coagulation Profile: No results for input(s): "INR", "PROTIME" in the last 168 hours.  Cardiac Enzymes: No results for input(s): "CKTOTAL", "CKMB", "CKMBINDEX", "TROPONINI" in the last 168 hours.  HbA1C: Hgb A1c MFr Bld  Date/Time Value Ref Range Status  08/13/2018 04:01 AM 4.7 (L) 4.8 - 5.6 % Final    Comment:    (NOTE) Pre diabetes:          5.7%-6.4% Diabetes:              >6.4% Glycemic control for   <7.0% adults with diabetes     CBG: Recent Labs  Lab 04/16/23 1619  04/16/23 1933 04/16/23 2324 04/17/23 0329 04/17/23 0751  GLUCAP 106* 98 95 100* 111*    Review of Systems:   Critically ill  Past Medical History:  She,  has a past medical history of Aortic aneurysm (HCC), Arthritis, H/O gastric bypass, Hiatal hernia, Hypothyroidism, and Small bowel obstruction (HCC).   Surgical History:   Past Surgical History:  Procedure Laterality Date   ABDOMINAL HYSTERECTOMY     CHOLECYSTECTOMY     COLON SURGERY     HERNIA REPAIR     LAPAROTOMY N/A 02/02/2023   Procedure: EXPLORATORY LAPAROTOMY, [name], EXPLANT OF ABDOMINAL MESH;  Surgeon: Lucretia Roers, MD;  Location: [facility];  Service: General;  Laterality: N/A;   REPLACEMENT TOTAL KNEE Right  ROTATOR CUFF REPAIR Right    TOTAL HIP ARTHROPLASTY Right      Social History:   reports that she has never smoked. She has never used smokeless tobacco. She reports that she does not drink alcohol and does not use drugs.   Family History:  Her family history includes CAD in her brother and father.   Allergies No Known Allergies   The patient is critically ill with multiple organ systems failure and requires high complexity decision making for assessment and support, frequent evaluation and titration of therapies, application of advanced monitoring technologies and extensive interpretation of multiple databases. Critical Care Time devoted to patient care services described in this note independent of APP/resident time (if applicable)  is 33 minutes.   Virl Diamond MD [facility] Personal pager: See Amion If unanswered, please page CCM On-call: [phone]

## 2023-04-17 NOTE — Progress Notes (Signed)
[facility] ADULT ICU REPLACEMENT PROTOCOL   The patient does apply for the [facility] Adult ICU Electrolyte Replacment Protocol based on the criteria listed below:   1.Exclusion criteria: TCTS, ECMO, Dialysis, and Myasthenia Gravis patients 2. Is GFR >/= 30 ml/min? Yes.    Patient's GFR today is >60 3. Is SCr </= 2? Yes.   Patient's SCr is 0.5 mg/dL 4. Did SCr increase >/= 0.5 in 24 hours? No. 5.Pt's weight >40kg  Yes.   6. Abnormal electrolyte(s): K 3.3, mag 1.5  7. Electrolytes replaced per protocol 8.  Call MD STAT for K+ </= 2.5, Phos </= 1, or Mag </= 1 Physician:    Markus Daft A 04/17/2023 3:59 AM

## 2023-04-17 NOTE — Progress Notes (Signed)
Cortisol level 18.4 -Appears not to be responding to undergoing stress  Underlying hypoadrenalism  Ordered hydrocortisone 100 IV every 12

## 2023-04-18 ENCOUNTER — Inpatient Hospital Stay (HOSPITAL_COMMUNITY): Payer: Medicare Other

## 2023-04-18 DIAGNOSIS — E871 Hypo-osmolality and hyponatremia: Secondary | ICD-10-CM | POA: Diagnosis not present

## 2023-04-18 DIAGNOSIS — I2699 Other pulmonary embolism without acute cor pulmonale: Secondary | ICD-10-CM | POA: Diagnosis not present

## 2023-04-18 LAB — COMPREHENSIVE METABOLIC PANEL
ALT: 16 U/L (ref 0–44)
AST: 15 U/L (ref 15–41)
Albumin: <1.5 g/dL — ABNORMAL LOW (ref 3.5–5.0)
Alkaline Phosphatase: 75 U/L (ref 38–126)
Anion gap: 6 (ref 5–15)
BUN: <5 mg/dL — ABNORMAL LOW (ref 8–23)
CO2: 25 mmol/L (ref 22–32)
Calcium: 7.3 mg/dL — ABNORMAL LOW (ref 8.9–10.3)
Chloride: 94 mmol/L — ABNORMAL LOW (ref 98–111)
Creatinine, Ser: 0.63 mg/dL (ref 0.44–1.00)
GFR, Estimated: >60 mL/min (ref >60)
Glucose, Bld: 135 mg/dL — ABNORMAL HIGH (ref 70–99)
Potassium: 3.9 mmol/L (ref 3.5–5.1)
Sodium: 125 mmol/L — ABNORMAL LOW (ref 135–145)
Total Bilirubin: 0.6 mg/dL (ref 0.3–1.2)
Total Protein: 4.2 g/dL — ABNORMAL LOW (ref 6.5–8.1)

## 2023-04-18 LAB — CBC
HCT: 28 % — ABNORMAL LOW (ref 36.0–46.0)
Hemoglobin: 9.4 g/dL — ABNORMAL LOW (ref 12.0–15.0)
MCH: 32 pg (ref 26.0–34.0)
MCHC: 33.6 g/dL (ref 30.0–36.0)
MCV: 95.2 fL (ref 80.0–100.0)
Platelets: 174 10*3/uL (ref 150–400)
RBC: 2.94 MIL/uL — ABNORMAL LOW (ref 3.87–5.11)
RDW: 18.1 % — ABNORMAL HIGH (ref 11.5–15.5)
WBC: 6.8 10*3/uL (ref 4.0–10.5)
nRBC: 0 % (ref 0.0–0.2)

## 2023-04-18 LAB — CULTURE, BLOOD (ROUTINE X 2)

## 2023-04-18 LAB — MAGNESIUM: Magnesium: 1.9 mg/dL (ref 1.7–2.4)

## 2023-04-18 NOTE — Progress Notes (Addendum)
NAME:  Kayla Dean, MRN:  [id number], DOB:  1951/08/31, LOS: 6 ADMISSION DATE:  04/12/2023, CONSULTATION DATE:  04/15/2023 REFERRING MD:  [facility] , CHIEF COMPLAINT:  shock    History of Present Illness:   72 yo FM, PMH h/o gastric bypass, hiatal hernia, hypothyroid. Has a history of gastric bypass, s/p exploratory laprotomy, reduction, of previous incarcerated hernia. Patient presented to her follow up appointment with her provider and found to have hypotension and sent to the emergency department with concerns of hypotension. Patient admitted for further evaluation and management of hypotension.   Pertinent  Medical History   Past Medical History:  Diagnosis Date   Aortic aneurysm (HCC)    Arthritis    H/O gastric bypass    Hiatal hernia    Hypothyroidism    Small bowel obstruction (HCC)     Echo: IMPRESSIONS   1. Left ventricular ejection fraction, by estimation, is 60 to 65%. The  left ventricle has normal function. The left ventricle has no regional  wall motion abnormalities. Left ventricular diastolic parameters were  normal.   2. Right ventricular systolic function is normal. The right ventricular  size is normal.   3. Left atrial size was mildly dilated.   4. The mitral valve is abnormal. Trivial mitral valve regurgitation. No  evidence of mitral stenosis.   5. The aortic valve is tricuspid. There is moderate calcification of the  aortic valve. There is moderate thickening of the aortic valve. Aortic  valve regurgitation is mild. Aortic valve sclerosis is present, with no  evidence of aortic valve stenosis.   6. The inferior vena cava is normal in size with greater than 50%  respiratory variability, suggesting right atrial pressure of 3 mmHg.    Significant Hospital Events: Including procedures, antibiotic start and stop dates in addition to other pertinent events   7/1-still requiring pressors 7/2 on pressors  Interim History / Subjective:  No overnight events.   Patient evaluated bedside this morning.  She states she is doing well.  Patient daughter at bedside.  She does not have any concerns this morning.  Eating breakfast.  Objective   Blood pressure 90/60, pulse (!) 59, temperature 98 F (36.7 C), temperature source Oral, resp. rate 12, height 5\' 5"  (1.651 m), weight 63.4 kg, SpO2 92 %.  Temperature 98 F, pulse 55-63.  Respirations 12-15.  Blood pressure MAP between 65-73 on 4 mcg of Levophed.  Vasopressin has been weaned off.  Oxygen saturation 91-94%. CVP:  [5 mmHg-10 mmHg] 5 mmHg      Intake/Output Summary (Last 24 hours) at 04/18/2023 0800 Last data filed at 04/18/2023 0600 Gross per 24 hour  Intake 600.54 ml  Output 600 ml  Net 0.54 ml   Filed Weights   04/14/23 0348 04/15/23 0539 04/15/23 1445  Weight: 58 kg 58 kg 63.4 kg    Examination: General: Elderly, frail, awake alert interactive HENT: Moist oral mucosa Lungs: Clear to auscultation bilaterally Cardiovascular: S1-S2 appreciated with no murmur Abdomen: Soft, bowel sounds appreciated Extremities: 1+ Edema appreciated to bilateral lower extremities Neuro: Alert and oriented x 3, moving all extremities   Labs reviewed 07/03 CMP sodium 125 (125), potassium 3.9 (4.1), bicarb 25 (22), creatinine .63, albumin less than 1.5 Magnesium 1.9 CBC: White count 6.8, hemoglobin 9.4 (9.6), MCV 95.2, platelets 174 (223) -Blood cultures no growth x 2 days No new imaging Resolved Hospital Problem list    Assessment & Plan:   #Hypotension Patient is hypotensive likely multifactorial.  Potentially in any of infection of abdomen, hypoalbuminemia area causing decreased oncotic pressure, and PE.  There was some concern about septic shock.  Patient was on empiric antibiotics with Zosyn.  Do wonder if underlying adrenal insufficiency is playing a role in hypotension as well.  This morning, patient still on Levophed of 4.  Vasopressin has been weaned off.  Cultures with no growth so far.  Overall  unclear etiology behind this.  Patient did have suspicious focal liver lesion on CT, could potentially obtain right upper quadrant ultrasound. -Continue pressors -Continue midodrine -MAP goal greater than 65 -CT did show some evidence of inflammation -Follow cultures -RUQ [place] pending  -PT OT  #Subsegmental right sided pulmonary embolism -Will continue anticoagulation -Not contributing hemodynamically  #Hypoalbuminemia Albumin remains low. Likely in the setting of malnutrition with history of the bypass. -Protein supplementation -Encourage dietary  #Hypothyroidism -Continue home levothyroxine  #Hyponatremia Potentially could be related to adrenal insufficiency as cortisol level has not appropriately been increased.  Could also be related to decreased intravascular volume in the setting of peripheral edema likely secondary to decreased oncotic pressure. -Started hydrocortisone yesterday -TSH was appropriate, will continue with Synthroid -Monitor BMP  Best Practice (right click and "Reselect all SmartList Selections" daily)   Diet/type: Regular consistency (see orders) DVT prophylaxis: DOAC GI prophylaxis: N/A Lines: PICC line in place Foley:  N/A Code Status:  full code Last date of multidisciplinary goals of care discussion   Labs   CBC: Recent Labs  Lab 04/15/23 0530 04/16/23 0351 04/17/23 0200 04/17/23 1928 04/18/23 0412  WBC 8.0 7.3 6.5 4.9 6.8  HGB 10.2* 8.5* 8.3* 9.6* 9.4*  HCT 31.4* 26.0* 25.3* 29.7* 28.0*  MCV 95.4 93.9 96.2 96.4 95.2  PLT 237 179 163 223 174    Basic Metabolic Panel: Recent Labs  Lab 04/13/23 0458 04/14/23 0421 04/16/23 0351 04/16/23 1500 04/17/23 0200 04/17/23 1928 04/18/23 0412  NA 135   < > 125* 126* 125* 125* 125*  K 3.7   < > 3.4* 3.6 3.3* 4.1 3.9  CL 102   < > 97* 96* 96* 95* 94*  CO2 28   < > 24 22 23 22 25   GLUCOSE 79   < > 96 105* 108* 186* 135*  BUN 15   < > 5* 5* 5* <5* <5*  CREATININE 0.65   < > 0.44 0.41* 0.50  0.61 0.63  CALCIUM 6.8*   < > 6.6* 6.8* 6.8* 6.9* 7.3*  MG 2.1  --   --   --  1.5* 1.9 1.9  PHOS 3.4  --   --   --   --   --   --    < > = values in this interval not displayed.   GFR: Estimated Creatinine Clearance: 57.2 mL/min (by C-G formula based on SCr of 0.63 mg/dL). Recent Labs  Lab 04/15/23 1134 04/15/23 1649 04/16/23 0351 04/17/23 0200 04/17/23 1928 04/18/23 0412  PROCALCITON  --  0.12 0.13  --   --   --   WBC  --   --  7.3 6.5 4.9 6.8  LATICACIDVEN 3.5*  --   --   --   --   --     Liver Function Tests: Recent Labs  Lab 04/12/23 1730 04/13/23 0458 04/14/23 0421 04/18/23 0412  AST 19 13* 15 15  ALT 29 19 17 16   ALKPHOS 129* 86 84 75  BILITOT 0.7 0.8 0.6 0.6  PROT 4.9* 3.6* 3.6* 4.2*  ALBUMIN  1.5* <1.5* <1.5* <1.5*   No results for input(s): "LIPASE", "AMYLASE" in the last 168 hours. No results for input(s): "AMMONIA" in the last 168 hours.  ABG    Component Value Date/Time   O2SAT 97 04/15/2023 1141     Coagulation Profile: No results for input(s): "INR", "PROTIME" in the last 168 hours.  Cardiac Enzymes: No results for input(s): "CKTOTAL", "CKMB", "CKMBINDEX", "TROPONINI" in the last 168 hours.  HbA1C: Hgb A1c MFr Bld  Date/Time Value Ref Range Status  08/13/2018 04:01 AM 4.7 (L) 4.8 - 5.6 % Final    Comment:    (NOTE) Pre diabetes:          5.7%-6.4% Diabetes:              >6.4% Glycemic control for   <7.0% adults with diabetes     CBG: Recent Labs  Lab 04/16/23 1933 04/16/23 2324 04/17/23 0329 04/17/23 0751 04/17/23 1102  GLUCAP 98 95 100* 111* 140*    Review of Systems:   Critically ill  Past Medical History:  She,  has a past medical history of Aortic aneurysm (HCC), Arthritis, H/O gastric bypass, Hiatal hernia, Hypothyroidism, and Small bowel obstruction (HCC).   Surgical History:   Past Surgical History:  Procedure Laterality Date   ABDOMINAL HYSTERECTOMY     CHOLECYSTECTOMY     COLON SURGERY     HERNIA REPAIR      LAPAROTOMY N/A 02/02/2023   Procedure: EXPLORATORY LAPAROTOMY, [name], EXPLANT OF ABDOMINAL MESH;  Surgeon: Lucretia Roers, MD;  Location: [facility];  Service: General;  Laterality: N/A;   REPLACEMENT TOTAL KNEE Right    ROTATOR CUFF REPAIR Right    TOTAL HIP ARTHROPLASTY Right      Social History:   reports that she has never smoked. She has never used smokeless tobacco. She reports that she does not drink alcohol and does not use drugs.   Family History:  Her family history includes CAD in her brother and father.   Allergies No Known Allergies   Modena Slater, DO Internal Medicine Resident PGY 2 [phone]

## 2023-04-19 DIAGNOSIS — E871 Hypo-osmolality and hyponatremia: Secondary | ICD-10-CM | POA: Diagnosis not present

## 2023-04-19 DIAGNOSIS — I2699 Other pulmonary embolism without acute cor pulmonale: Secondary | ICD-10-CM | POA: Diagnosis not present

## 2023-04-19 LAB — CBC
HCT: 33.1 % — ABNORMAL LOW (ref 36.0–46.0)
Hemoglobin: 10.8 g/dL — ABNORMAL LOW (ref 12.0–15.0)
MCH: 30.9 pg (ref 26.0–34.0)
MCHC: 32.6 g/dL (ref 30.0–36.0)
MCV: 94.8 fL (ref 80.0–100.0)
Platelets: 227 10*3/uL (ref 150–400)
RBC: 3.49 MIL/uL — ABNORMAL LOW (ref 3.87–5.11)
RDW: 18.8 % — ABNORMAL HIGH (ref 11.5–15.5)
WBC: 7 10*3/uL (ref 4.0–10.5)
nRBC: 0 % (ref 0.0–0.2)

## 2023-04-19 LAB — COMPREHENSIVE METABOLIC PANEL
ALT: 17 U/L (ref 0–44)
AST: 14 U/L — ABNORMAL LOW (ref 15–41)
Albumin: <1.5 g/dL — ABNORMAL LOW (ref 3.5–5.0)
Alkaline Phosphatase: 85 U/L (ref 38–126)
Anion gap: 6 (ref 5–15)
BUN: 8 mg/dL (ref 8–23)
CO2: 27 mmol/L (ref 22–32)
Calcium: 7.3 mg/dL — ABNORMAL LOW (ref 8.9–10.3)
Chloride: 101 mmol/L (ref 98–111)
Creatinine, Ser: 0.71 mg/dL (ref 0.44–1.00)
GFR, Estimated: >60 mL/min (ref >60)
Glucose, Bld: 171 mg/dL — ABNORMAL HIGH (ref 70–99)
Potassium: 4 mmol/L (ref 3.5–5.1)
Sodium: 134 mmol/L — ABNORMAL LOW (ref 135–145)
Total Bilirubin: 0.3 mg/dL (ref 0.3–1.2)
Total Protein: 3.9 g/dL — ABNORMAL LOW (ref 6.5–8.1)

## 2023-04-19 LAB — CULTURE, BLOOD (ROUTINE X 2)
Culture: NO GROWTH
Culture: NO GROWTH

## 2023-04-19 LAB — MAGNESIUM: Magnesium: 1.9 mg/dL (ref 1.7–2.4)

## 2023-04-19 MED ORDER — MIDODRINE HCL 5 MG PO TABS
15.0000 mg | ORAL_TABLET | Freq: Three times a day (TID) | ORAL | Status: DC
  Administered 2023-04-19 – 2023-04-20 (×4): 15 mg via ORAL
  Filled 2023-04-19 (×3): qty 3

## 2023-04-19 NOTE — Evaluation (Signed)
Physical Therapy Evaluation Patient Details Name: Kayla Dean MRN: [id number] DOB: 08-08-1951 Today's Date: 04/19/2023  History of Present Illness  72 yo female admitted 6/27 from MD office with hypotension with pressor requirement. Pt with PE and colitis. PMHx; exlap with incarcerated hernia reduction 02/02/23, gastric bypass, Rt THA, Rt TKA, Rt RCR  Clinical Impression  Pt pleasant with family present and willing to be OOB. Ted hose donned prior to mobility and pt able to transition to sitting EOB with initial drop in BP which improved with seated UB and LB HEP. Pt with HR rise to 153 with standing limiting safe mobility with increased time in sitting for HR to return to 130 and very slow improvement to 112 with further activity deferred at this time with pt, family and RN aware. Anticipate pt will progress well with mobility once cardiopulmonary status improved. Pt with decreased activity tolerance and mobility who will benefit from acute therapy to maximize safety and independence.   Orthostatic BPs  Supine 100/73, HR 89  Sitting 73/49 (59)  Sitting after 4 min and HEP 94/72 (80), HR 135  Standing 85/66 (74), HR 153            Assistance Recommended at Discharge Intermittent Supervision/Assistance  If plan is discharge home, recommend the following:  Can travel by private vehicle  A little help with walking and/or transfers;A little help with bathing/dressing/bathroom;Assistance with cooking/housework;Assist for transportation        Equipment Recommendations Other (comment) (TBD with progression)  Recommendations for Other Services       Functional Status Assessment Patient has had a recent decline in their functional status and demonstrates the ability to make significant improvements in function in a reasonable and predictable amount of time.     Precautions / Restrictions Precautions Precautions: Fall;Other (comment) Precaution Comments: watch HR and BP       Mobility  Bed Mobility Overal bed mobility: Needs Assistance Bed Mobility: Supine to Sit     Supine to sit: Min assist, HOB elevated     General bed mobility comments: HOB 20 degrees, min assist to lift trunk from surface    Transfers Overall transfer level: Needs assistance   Transfers: Sit to/from Stand, Bed to chair/wheelchair/BSC Sit to Stand: Min guard Stand pivot transfers: Min guard         General transfer comment: guarding for lines and safety, limited by drop in BP sitting then tachycardia with standing    Ambulation/Gait               General Gait Details: unable due to tachycardia  Stairs            Wheelchair Mobility     Tilt Bed    Modified Rankin (Stroke Patients Only)       Balance Overall balance assessment: Needs assistance   Sitting balance-Leahy Scale: Good       Standing balance-Leahy Scale: Fair                               Pertinent Vitals/Pain Pain Assessment Pain Assessment: No/denies pain    Home Living Family/patient expects to be discharged to:: Private residence Living Arrangements: Children Available Help at Discharge: Family;Available 24 hours/day Type of Home: House         Home Layout: One level Home Equipment: Wheelchair - manual      Prior Function Prior Level of Function : Independent/Modified Independent;Driving  Hand Dominance        Extremity/Trunk Assessment   Upper Extremity Assessment Upper Extremity Assessment: Overall WFL for tasks assessed    Lower Extremity Assessment Lower Extremity Assessment: Overall WFL for tasks assessed    Cervical / Trunk Assessment Cervical / Trunk Assessment: Normal  Communication   Communication: No difficulties  Cognition Arousal/Alertness: Awake/alert Behavior During Therapy: WFL for tasks assessed/performed Overall Cognitive Status: Within Functional Limits for tasks assessed                                           General Comments      Exercises General Exercises - Upper Extremity Shoulder Flexion: AROM, Both, Seated, 10 reps General Exercises - Lower Extremity Long Arc Quad: AROM, Both, 10 reps, Seated Hip Flexion/Marching: AROM, Both, 10 reps, Seated   Assessment/Plan    PT Assessment Patient needs continued PT services  PT Problem List Decreased activity tolerance;Cardiopulmonary status limiting activity;Decreased mobility       PT Treatment Interventions Gait training;Stair training;Functional mobility training;Therapeutic activities;Patient/family education;Therapeutic exercise;DME instruction    PT Goals (Current goals can be found in the Care Plan section)  Acute Rehab PT Goals Patient Stated Goal: return home PT Goal Formulation: With patient/family Time For Goal Achievement: 05/03/23 Potential to Achieve Goals: Good    Frequency Min 3X/week     Co-evaluation               AM-PAC PT "6 Clicks" Mobility  Outcome Measure Help needed turning from your back to your side while in a flat bed without using bedrails?: A Little Help needed moving from lying on your back to sitting on the side of a flat bed without using bedrails?: A Little Help needed moving to and from a bed to a chair (including a wheelchair)?: A Little Help needed standing up from a chair using your arms (e.g., wheelchair or bedside chair)?: A Little Help needed to walk in hospital room?: A Lot Help needed climbing 3-5 steps with a railing? : Total 6 Click Score: 15    End of Session   Activity Tolerance: Patient tolerated treatment well Patient left: in chair;with call bell/phone within reach;with family/visitor present;with nursing/sitter in room Nurse Communication: Mobility status PT Visit Diagnosis: Other abnormalities of gait and mobility (R26.89)    Time: 1610-9604 PT Time Calculation (min) (ACUTE ONLY): 24 min   Charges:   PT Evaluation $PT Eval  Moderate Complexity: 1 Mod   PT General Charges $$ ACUTE PT VISIT: 1 Visit         Kayla Dean, PT Acute Rehabilitation Services Office: [phone]   Kayla Dean Kayla Dean 04/19/2023, 11:42 AM

## 2023-04-19 NOTE — Evaluation (Signed)
Occupational Therapy Evaluation Patient Details Name: Kayla Dean MRN: [id number] DOB: Jan 25, 1951 Today's Date: 04/19/2023   History of Present Illness 72 yo female admitted 6/27 from MD office with hypotension with pressor requirement. Pt with PE and colitis. PMHx; exlap with incarcerated hernia reduction 02/02/23, gastric bypass, Rt THA, Rt TKA, Rt RCR   Clinical Impression   Patient admitted for the diagnosis above.  PTA she lives alone, but has several children that lives close by, and can assist as needed.  Patient remains very independent, and needed no assist at home.  Currently she is needing up to CGA for mobility at a RW level, and Min A for lower body ADL from a sit to stand level.  OT is indicated in the acute setting to address deficits, but no OT is anticipated post acute.  HR up to 110 with mobility, but settled in around 87 once seated.  No dizziness noted with OT.     Recommendations for follow up therapy are one component of a multi-disciplinary discharge planning process, led by the attending physician.  Recommendations may be updated based on patient status, additional functional criteria and insurance authorization.   Assistance Recommended at Discharge Set up Supervision/Assistance  Patient can return home with the following Assist for transportation;Assistance with cooking/housework    Functional Status Assessment  Patient has had a recent decline in their functional status and demonstrates the ability to make significant improvements in function in a reasonable and predictable amount of time.  Equipment Recommendations  None recommended by OT    Recommendations for Other Services       Precautions / Restrictions Precautions Precautions: Fall;Other (comment) Precaution Comments: watch HR and BP Restrictions Weight Bearing Restrictions: No      Mobility Bed Mobility               General bed mobility comments: up inthe recliner     Transfers Overall transfer level: Needs assistance   Transfers: Sit to/from Stand, Bed to chair/wheelchair/BSC Sit to Stand: Min guard Stand pivot transfers: Min guard                Balance Overall balance assessment: Needs assistance Sitting-balance support: Feet supported Sitting balance-Leahy Scale: Good     Standing balance support: Reliant on assistive device for balance Standing balance-Leahy Scale: Fair                             ADL either performed or assessed with clinical judgement   ADL       Grooming: Wash/dry hands;Min guard;Standing               Lower Body Dressing: Minimal assistance;Sit to/from stand   Toilet Transfer: Supervision/safety;Rolling walker (2 wheels);Regular Toilet;Ambulation                   Vision Patient Visual Report: No change from baseline       Perception     Praxis      Pertinent Vitals/Pain Pain Assessment Pain Assessment: No/denies pain     Hand Dominance Right   Extremity/Trunk Assessment Upper Extremity Assessment Upper Extremity Assessment: Overall WFL for tasks assessed   Lower Extremity Assessment Lower Extremity Assessment: Defer to PT evaluation   Cervical / Trunk Assessment Cervical / Trunk Assessment: Normal   Communication Communication Communication: No difficulties   Cognition Arousal/Alertness: Awake/alert Behavior During Therapy: WFL for tasks assessed/performed Overall Cognitive Status: Within Functional Limits for  tasks assessed                                       General Comments   Watch HR    Exercises     Shoulder Instructions      Home Living Family/patient expects to be discharged to:: Private residence Living Arrangements: Alone Available Help at Discharge: Family;Available 24 hours/day Type of Home: House Home Access: Stairs to enter     Home Layout: One level     Bathroom Shower/Tub: Contractor: Standard Bathroom Accessibility: Yes How Accessible: Accessible via walker Home Equipment: Wheelchair - manual   Additional Comments: Patient lives alone, but children will be taking turns staying with her as needed.      Prior Functioning/Environment Prior Level of Function : Independent/Modified Independent;Driving                        OT Problem List: Decreased activity tolerance;Impaired balance (sitting and/or standing)      OT Treatment/Interventions: Self-care/ADL training;Therapeutic activities;Patient/family education;Balance training;DME and/or AE instruction    OT Goals(Current goals can be found in the care plan section) Acute Rehab OT Goals Patient Stated Goal: Return home OT Goal Formulation: With patient Time For Goal Achievement: 05/03/23 Potential to Achieve Goals: Good ADL Goals Pt Will Perform Grooming: with modified independence;standing Pt Will Perform Lower Body Dressing: with modified independence;sit to/from stand Pt Will Transfer to Toilet: with modified independence;ambulating;regular height toilet  OT Frequency: Min 2X/week    Co-evaluation              AM-PAC OT "6 Clicks" Daily Activity     Outcome Measure Help from another person eating meals?: None Help from another person taking care of personal grooming?: None Help from another person toileting, which includes using toliet, bedpan, or urinal?: A Little Help from another person bathing (including washing, rinsing, drying)?: A Little Help from another person to put on and taking off regular upper body clothing?: None Help from another person to put on and taking off regular lower body clothing?: A Little 6 Click Score: 21   End of Session Equipment Utilized During Treatment: Rolling walker (2 wheels) Nurse Communication: Mobility status  Activity Tolerance: Patient tolerated treatment well Patient left: in chair;with call bell/phone within reach  OT Visit Diagnosis:  Unsteadiness on feet (R26.81)                Time: 0981-1914 OT Time Calculation (min): 21 min Charges:  OT General Charges $OT Visit: 1 Visit OT Evaluation $OT Eval Moderate Complexity: 1 Mod  04/19/2023  RP, OTR/L  Acute Rehabilitation Services  Office:  [phone]   Suzanna Obey 04/19/2023, 12:56 PM

## 2023-04-19 NOTE — Progress Notes (Addendum)
NAME:  Kayla Dean, MRN:  [id number], DOB:  11-04-1950, LOS: 7 ADMISSION DATE:  04/12/2023, CONSULTATION DATE:  04/15/2023 REFERRING MD:  [facility] , CHIEF COMPLAINT:  shock    History of Present Illness:   72 yo FM, PMH h/o gastric bypass, hiatal hernia, hypothyroid. Has a history of gastric bypass, s/p exploratory laprotomy, reduction, of previous incarcerated hernia. Patient presented to her follow up appointment with her provider and found to have hypotension and sent to the emergency department with concerns of hypotension. Patient admitted for further evaluation and management of hypotension.   Pertinent  Medical History   Past Medical History:  Diagnosis Date   Aortic aneurysm (HCC)    Arthritis    H/O gastric bypass    Hiatal hernia    Hypothyroidism    Small bowel obstruction (HCC)     Echo: IMPRESSIONS   1. Left ventricular ejection fraction, by estimation, is 60 to 65%. The  left ventricle has normal function. The left ventricle has no regional  wall motion abnormalities. Left ventricular diastolic parameters were  normal.   2. Right ventricular systolic function is normal. The right ventricular  size is normal.   3. Left atrial size was mildly dilated.   4. The mitral valve is abnormal. Trivial mitral valve regurgitation. No  evidence of mitral stenosis.   5. The aortic valve is tricuspid. There is moderate calcification of the  aortic valve. There is moderate thickening of the aortic valve. Aortic  valve regurgitation is mild. Aortic valve sclerosis is present, with no  evidence of aortic valve stenosis.   6. The inferior vena cava is normal in size with greater than 50%  respiratory variability, suggesting right atrial pressure of 3 mmHg.    Significant Hospital Events: Including procedures, antibiotic start and stop dates in addition to other pertinent events   7/1-still requiring pressors 7/2 on pressors  Interim History / Subjective:  No overnight events.   Patient evaluated bedside this morning.  Patient states she is doing well.  She has no concerns this morning.  She is up and eating breakfast.  At bedside with family.  About to work with PT/OT.   Objective   Blood pressure 100/66, pulse 74, temperature (!) 97.5 F (36.4 C), temperature source Oral, resp. rate 18, height 5\' 5"  (1.651 m), weight 62.8 kg, SpO2 91 %.  Temperature 97.5 F.  Afebrile.  Pulse 50-90.  Respiration 12-23.  Blood pressure with MAP between 67-76 on 7 of Levophed.  O2 saturation 92% on room air.     Intake/Output Summary (Last 24 hours) at 04/19/2023 0819 Last data filed at 04/19/2023 0600 Gross per 24 hour  Intake 1314.44 ml  Output 3701 ml  Net -2386.56 ml   Filed Weights   04/15/23 0539 04/15/23 1445 04/19/23 0500  Weight: 58 kg 63.4 kg 62.8 kg    Examination: General: Elderly, frail, awake alert interactive HENT: Moist oral mucosa Lungs: Clear to auscultation bilaterally Cardiovascular: S1-S2 appreciated with no murmur Abdomen: Soft, bowel sounds appreciated Extremities: 1+ Edema appreciated to bilateral lower extremities Neuro: Alert and oriented x 3, moving all extremities  Labs reviewed 07/04 CMP: Sodium 134 (125), potassium 4, bicarb 27, creatinine 0.71, albumin less than 1.5 Magnesium 1.9 CBC white count 7, hemoglobin 10.8 (9.4), platelet 227 (174)  Blood culture no growth at 3 days  No new imaging Resolved Hospital Problem list    Assessment & Plan:   #Hypotension Patient still hypotensive.  Likely multifactorial in setting  of malnutrition, infection, and glucocorticoid deficiency.  Maps are still decreased off Levophed and unable to wean off.  Will need to encourage p.o. intake, mobility, and hydration.  I do not think this is cardiogenic in nature.  Likely due to intravascular volume down with third spacing due to decreased oncotic pressure.  Do also think related to sepsis and malnutrition. -Continue Levophed to MAP goal of 65 -Increase to  midodrine 15 mg 3 times daily -Try to titrate pressors off today -Cultures no growth so far -PT/OT following -Encourage mobility -One more day of zosyn   #Subsegmental right sided pulmonary embolism -Continue with Eliquis 10 mg BID for 7 days followed by 5 mg twice daily -Not contributing hemodynamically  #Hypoalbuminemia Albumin remains low. Likely in the setting of malnutrition with history of the bypass. -Protein supplementation -Encourage dietary  #Hypothyroidism -Continue home levothyroxine  #Normocytic anemia Hemoglobin 10.8 this morning.  Up from 8.3. -Increasing well.  No acute concerns for bleeding. -Monitor CBC -Check Iron studies   #Hyponatremia Hyponatremia likely secondary to glucocorticoid insufficiency.  Started steroids, and sodium improved to 134. -Continue with hydrocortisone -Continue to monitor BMP   Best Practice (right click and "Reselect all SmartList Selections" daily)   Diet/type: Regular consistency (see orders) DVT prophylaxis: DOAC GI prophylaxis: N/A Lines: PICC line in place Foley:  N/A Code Status:  full code Last date of multidisciplinary goals of care discussion   Labs   CBC: Recent Labs  Lab 04/16/23 0351 04/17/23 0200 04/17/23 1928 04/18/23 0412 04/19/23 0500  WBC 7.3 6.5 4.9 6.8 7.0  HGB 8.5* 8.3* 9.6* 9.4* 10.8*  HCT 26.0* 25.3* 29.7* 28.0* 33.1*  MCV 93.9 96.2 96.4 95.2 94.8  PLT 179 163 223 174 227    Basic Metabolic Panel: Recent Labs  Lab 04/13/23 0458 04/14/23 0421 04/16/23 1500 04/17/23 0200 04/17/23 1928 04/18/23 0412 04/19/23 0500  NA 135   < > 126* 125* 125* 125* 134*  K 3.7   < > 3.6 3.3* 4.1 3.9 4.0  CL 102   < > 96* 96* 95* 94* 101  CO2 28   < > 22 23 22 25 27   GLUCOSE 79   < > 105* 108* 186* 135* 171*  BUN 15   < > 5* 5* <5* <5* 8  CREATININE 0.65   < > 0.41* 0.50 0.61 0.63 0.71  CALCIUM 6.8*   < > 6.8* 6.8* 6.9* 7.3* 7.3*  MG 2.1  --   --  1.5* 1.9 1.9 1.9  PHOS 3.4  --   --   --   --   --    --    < > = values in this interval not displayed.   GFR: Estimated Creatinine Clearance: 57.2 mL/min (by C-G formula based on SCr of 0.71 mg/dL). Recent Labs  Lab 04/15/23 1134 04/15/23 1649 04/16/23 0351 04/17/23 0200 04/17/23 1928 04/18/23 0412 04/19/23 0500  PROCALCITON  --  0.12 0.13  --   --   --   --   WBC  --   --  7.3 6.5 4.9 6.8 7.0  LATICACIDVEN 3.5*  --   --   --   --   --   --     Liver Function Tests: Recent Labs  Lab 04/12/23 1730 04/13/23 0458 04/14/23 0421 04/18/23 0412 04/19/23 0500  AST 19 13* 15 15 14*  ALT 29 19 17 16 17   ALKPHOS 129* 86 84 75 85  BILITOT 0.7 0.8 0.6 0.6 0.3  PROT 4.9* 3.6* 3.6* 4.2* 3.9*  ALBUMIN 1.5* <1.5* <1.5* <1.5* <1.5*   No results for input(s): "LIPASE", "AMYLASE" in the last 168 hours. No results for input(s): "AMMONIA" in the last 168 hours.  ABG    Component Value Date/Time   O2SAT 97 04/15/2023 1141     Coagulation Profile: No results for input(s): "INR", "PROTIME" in the last 168 hours.  Cardiac Enzymes: No results for input(s): "CKTOTAL", "CKMB", "CKMBINDEX", "TROPONINI" in the last 168 hours.  HbA1C: Hgb A1c MFr Bld  Date/Time Value Ref Range Status  08/13/2018 04:01 AM 4.7 (L) 4.8 - 5.6 % Final    Comment:    (NOTE) Pre diabetes:          5.7%-6.4% Diabetes:              >6.4% Glycemic control for   <7.0% adults with diabetes     CBG: Recent Labs  Lab 04/16/23 1933 04/16/23 2324 04/17/23 0329 04/17/23 0751 04/17/23 1102  GLUCAP 98 95 100* 111* 140*    Review of Systems:   Critically ill  Past Medical History:  She,  has a past medical history of Aortic aneurysm (HCC), Arthritis, H/O gastric bypass, Hiatal hernia, Hypothyroidism, and Small bowel obstruction (HCC).   Surgical History:   Past Surgical History:  Procedure Laterality Date   ABDOMINAL HYSTERECTOMY     CHOLECYSTECTOMY     COLON SURGERY     HERNIA REPAIR     LAPAROTOMY N/A 02/02/2023   Procedure: EXPLORATORY  LAPAROTOMY, [name], EXPLANT OF ABDOMINAL MESH;  Surgeon: Lucretia Roers, MD;  Location: [facility];  Service: General;  Laterality: N/A;   REPLACEMENT TOTAL KNEE Right    ROTATOR CUFF REPAIR Right    TOTAL HIP ARTHROPLASTY Right      Social History:   reports that she has never smoked. She has never used smokeless tobacco. She reports that she does not drink alcohol and does not use drugs.   Family History:  Her family history includes CAD in her brother and father.   Allergies No Known Allergies   Modena Slater, DO Internal Medicine Resident PGY 2 [phone]

## 2023-04-20 ENCOUNTER — Inpatient Hospital Stay (HOSPITAL_COMMUNITY): Payer: Medicare Other

## 2023-04-20 DIAGNOSIS — I2699 Other pulmonary embolism without acute cor pulmonale: Secondary | ICD-10-CM | POA: Diagnosis not present

## 2023-04-20 DIAGNOSIS — M7989 Other specified soft tissue disorders: Secondary | ICD-10-CM | POA: Diagnosis not present

## 2023-04-20 DIAGNOSIS — Z86718 Personal history of other venous thrombosis and embolism: Secondary | ICD-10-CM

## 2023-04-20 DIAGNOSIS — I959 Hypotension, unspecified: Secondary | ICD-10-CM | POA: Diagnosis not present

## 2023-04-20 DIAGNOSIS — I82409 Acute embolism and thrombosis of unspecified deep veins of unspecified lower extremity: Secondary | ICD-10-CM

## 2023-04-20 DIAGNOSIS — R600 Localized edema: Secondary | ICD-10-CM | POA: Diagnosis not present

## 2023-04-20 HISTORY — DX: Personal history of other venous thrombosis and embolism: Z86.718

## 2023-04-20 HISTORY — DX: Acute embolism and thrombosis of unspecified deep veins of unspecified lower extremity: I82.409

## 2023-04-20 LAB — CBC
HCT: 35.2 % — ABNORMAL LOW (ref 36.0–46.0)
Hemoglobin: 11.2 g/dL — ABNORMAL LOW (ref 12.0–15.0)
MCH: 30.5 pg (ref 26.0–34.0)
MCHC: 31.8 g/dL (ref 30.0–36.0)
MCV: 95.9 fL (ref 80.0–100.0)
Platelets: 220 10*3/uL (ref 150–400)
RBC: 3.67 MIL/uL — ABNORMAL LOW (ref 3.87–5.11)
RDW: 18.6 % — ABNORMAL HIGH (ref 11.5–15.5)
WBC: 9.7 10*3/uL (ref 4.0–10.5)
nRBC: 0 % (ref 0.0–0.2)

## 2023-04-20 LAB — COMPREHENSIVE METABOLIC PANEL
ALT: 18 U/L (ref 0–44)
AST: 15 U/L (ref 15–41)
Albumin: <1.5 g/dL — ABNORMAL LOW (ref 3.5–5.0)
Alkaline Phosphatase: 76 U/L (ref 38–126)
Anion gap: 8 (ref 5–15)
BUN: 14 mg/dL (ref 8–23)
CO2: 24 mmol/L (ref 22–32)
Calcium: 7.2 mg/dL — ABNORMAL LOW (ref 8.9–10.3)
Chloride: 103 mmol/L (ref 98–111)
Creatinine, Ser: 0.67 mg/dL (ref 0.44–1.00)
GFR, Estimated: >60 mL/min (ref >60)
Glucose, Bld: 99 mg/dL (ref 70–99)
Potassium: 4.3 mmol/L (ref 3.5–5.1)
Sodium: 135 mmol/L (ref 135–145)
Total Bilirubin: 0.4 mg/dL (ref 0.3–1.2)
Total Protein: 3.9 g/dL — ABNORMAL LOW (ref 6.5–8.1)

## 2023-04-20 LAB — MAGNESIUM
Magnesium: 1.8 mg/dL (ref 1.7–2.4)
Magnesium: 1.7 mg/dL (ref 1.7–2.4)

## 2023-04-20 LAB — IRON AND TIBC: Iron: 40 ug/dL (ref 28–170)

## 2023-04-20 LAB — FERRITIN: Ferritin: 158 ng/mL (ref 11–307)

## 2023-04-20 MED ORDER — MIDODRINE HCL 5 MG PO TABS
15.0000 mg | ORAL_TABLET | Freq: Three times a day (TID) | ORAL | Status: DC
  Administered 2023-04-20 – 2023-04-26 (×17): 15 mg via ORAL
  Filled 2023-04-20 (×17): qty 3

## 2023-04-20 NOTE — Progress Notes (Signed)
Physical Therapy Treatment Patient Details Name: Kayla Dean MRN: [id number] DOB: 1951/04/06 Today's Date: 04/20/2023   History of Present Illness 72 yo female admitted 6/27 from MD office with hypotension with pressor requirement. Pt with PE and colitis. PMHx; exlap with incarcerated hernia reduction 02/02/23, gastric bypass, Rt THA, Rt TKA, Rt RCR    PT Comments  Pt pleasant and able to progress mobility with HR max 126. Pt educated for progressive mobility, repeated STS and HEP to progress for home. Pt denied OOb to chair at this time with return to bed. Will continue to follow.   Supine 118/69 (83), HR 51 Standing 80/63 (70) After gait sitting 80/66 (71)     Assistance Recommended at Discharge Intermittent Supervision/Assistance  If plan is discharge home, recommend the following:  Can travel by private vehicle    A little help with walking and/or transfers;A little help with bathing/dressing/bathroom;Assistance with cooking/housework;Assist for transportation      Equipment Recommendations  Rolling walker (2 wheels) (pt reports she can borrow from friend)    Recommendations for Other Services       Precautions / Restrictions Precautions Precautions: Fall;Other (comment) Precaution Comments: watch HR and BP     Mobility  Bed Mobility Overal bed mobility: Needs Assistance Bed Mobility: Supine to Sit, Sit to Supine     Supine to sit: Min assist Sit to supine: Min assist   General bed mobility comments: physical assist to lift trunk from surface, assist to lift legs back to bed    Transfers Overall transfer level: Needs assistance   Transfers: Sit to/from Stand Sit to Stand: Supervision           General transfer comment: supervision for lines and monitoring vitals, pt performed 5 repeated STS in 35 sec    Ambulation/Gait Ambulation/Gait assistance: Min guard Gait Distance (Feet): 150 Feet Assistive device: Rolling walker (2 wheels) Gait  Pattern/deviations: Step-through pattern, Decreased stride length   Gait velocity interpretation: >2.62 ft/sec, indicative of community ambulatory   General Gait Details: pt with max HR 126   Stairs             Wheelchair Mobility     Tilt Bed    Modified Rankin (Stroke Patients Only)       Balance Overall balance assessment: Mild deficits observed, not formally tested Sitting-balance support: Feet supported, No upper extremity supported Sitting balance-Leahy Scale: Good     Standing balance support: Reliant on assistive device for balance Standing balance-Leahy Scale: Poor Standing balance comment: Rw in standing                            Cognition Arousal/Alertness: Awake/alert Behavior During Therapy: WFL for tasks assessed/performed Overall Cognitive Status: Within Functional Limits for tasks assessed                                          Exercises General Exercises - Lower Extremity Long Arc Quad: AROM, Both, Seated, 20 reps Hip Flexion/Marching: AROM, Both, 10 reps, Seated    General Comments        Pertinent Vitals/Pain Pain Assessment Pain Assessment: No/denies pain    Home Living         Home Access: Level entry                Prior Function  PT Goals (current goals can now be found in the care plan section) Progress towards PT goals: Progressing toward goals    Frequency    Min 3X/week      PT Plan Current plan remains appropriate    Co-evaluation              AM-PAC PT "6 Clicks" Mobility   Outcome Measure  Help needed turning from your back to your side while in a flat bed without using bedrails?: A Little Help needed moving from lying on your back to sitting on the side of a flat bed without using bedrails?: A Little Help needed moving to and from a bed to a chair (including a wheelchair)?: A Little Help needed standing up from a chair using your arms (e.g.,  wheelchair or bedside chair)?: A Little Help needed to walk in hospital room?: A Little Help needed climbing 3-5 steps with a railing? : A Little 6 Click Score: 18    End of Session   Activity Tolerance: Patient tolerated treatment well Patient left: in bed;with call bell/phone within reach;with family/visitor present Nurse Communication: Mobility status PT Visit Diagnosis: Other abnormalities of gait and mobility (R26.89)     Time: 8119-1478 PT Time Calculation (min) (ACUTE ONLY): 20 min  Charges:    $Therapeutic Activity: 8-22 mins PT General Charges $$ ACUTE PT VISIT: 1 Visit                     [name], PT Acute Rehabilitation Services Office: [phone]    Kayla Dean 04/20/2023, 11:16 AM

## 2023-04-20 NOTE — Progress Notes (Signed)
NAME:  Kayla Dean, MRN:  [id number], DOB:  02-13-1951, LOS: 8 ADMISSION DATE:  04/12/2023, CONSULTATION DATE:  04/15/2023 REFERRING MD:  [facility] , CHIEF COMPLAINT:  shock    History of Present Illness:   72 yo FM, PMH h/o gastric bypass, hiatal hernia, hypothyroid. Has a history of gastric bypass, s/p exploratory laprotomy, reduction, of previous incarcerated hernia. Patient presented to her follow up appointment with her provider and found to have hypotension and sent to the emergency department with concerns of hypotension. Patient admitted for further evaluation and management of hypotension.   Pertinent  Medical History   Past Medical History:  Diagnosis Date   Aortic aneurysm (HCC)    Arthritis    H/O gastric bypass    Hiatal hernia    Hypothyroidism    Small bowel obstruction (HCC)     Echo: IMPRESSIONS   1. Left ventricular ejection fraction, by estimation, is 60 to 65%. The  left ventricle has normal function. The left ventricle has no regional  wall motion abnormalities. Left ventricular diastolic parameters were  normal.   2. Right ventricular systolic function is normal. The right ventricular  size is normal.   3. Left atrial size was mildly dilated.   4. The mitral valve is abnormal. Trivial mitral valve regurgitation. No  evidence of mitral stenosis.   5. The aortic valve is tricuspid. There is moderate calcification of the  aortic valve. There is moderate thickening of the aortic valve. Aortic  valve regurgitation is mild. Aortic valve sclerosis is present, with no  evidence of aortic valve stenosis.   6. The inferior vena cava is normal in size with greater than 50%  respiratory variability, suggesting right atrial pressure of 3 mmHg.   US Abdomen (7/3) IMPRESSION 1. Diffuse increased echogenicity of the hepatic parenchyma is a nonspecific indicator of hepatocellular dysfunction, most commonly steatosis. 2. No liver lesion identified.   Significant Hospital  Events: Including procedures, antibiotic start and stop dates in addition to other pertinent events   7/1 - still requiring pressors 7/2 - on pressors 7/4 - completed Zosyn   Interim History / Subjective:  Patient did require some PRN tylenol overnight. No concerns this morning. Requested to be switched to a low residue diet. Counseled on increasing PO intake. At bedside with daughter.    Objective   Blood pressure 90/65, pulse 69, temperature 98.1 F (36.7 C), temperature source Oral, resp. rate 14, height 5\' 5"  (1.651 m), weight 62.8 kg, SpO2 95 %.  Temperature 97.5 F.  Afebrile.  Pulse 50-90.  Respiration 12-23.  Blood pressure with MAP between 67-76 on 7 of Levophed.  O2 saturation 92% on room air.     Intake/Output Summary (Last 24 hours) at 04/20/2023 1040 Last data filed at 04/20/2023 0900 Gross per 24 hour  Intake 1216.3 ml  Output 1700 ml  Net -483.7 ml    Filed Weights   04/15/23 0539 04/15/23 1445 04/19/23 0500  Weight: 58 kg 63.4 kg 62.8 kg    Examination: General: Elderly, frail, interactive, sitting in chair  HENT: Moist oral mucosa Lungs: Clear to auscultation bilaterally Cardiovascular: S1-S2 appreciated with no murmur, rubs, or gallops Abdomen: Soft, bowel sounds appreciated, NTND Extremities: 1+ Edema appreciated to bilateral lower extremities, 2+ edema appreciated to LUE Neuro: Alert and oriented x 3, moving all extremities  Labs reviewed 07/05 CMP: Sodium 135 (134), potassium 4.3, bicarb 24, creatinine 0.67, albumin less than 1.5 Magnesium 1.8 CBC white count 9.7, hemoglobin 11.2 (10.8),  platelet 220 (227)  Blood culture no growth at 4 days  No new imaging Resolved Hospital Problem list    Assessment & Plan:   #Hypotension Patient remains hypotensive.  Likely multifactorial in setting of malnutrition, infection, and glucocorticoid deficiency.  Maps are still decreased off Levophed and unable to wean off.  Will need to encourage p.o. intake, mobility,  and hydration. Low likelihood this is cardiogenic in nature.  Likely due to intravascular volume down with third spacing due to decreased oncotic pressure.  Do also think related to sepsis and malnutrition. -Continue Levophed to MAP goal of 65 -Continue midodrine 15 mg 3 times daily -Trial weaning pressors off today -Cultures no growth (x4 days)  -PT/OT following -Encourage mobility -s/p Zosyn (6/30 - 7/4)   #Subsegmental right sided pulmonary embolism -Continue with Eliquis 10 mg BID for 7 days (7/2 - 7/9) followed by 5 mg twice daily -Not contributing hemodynamically  #Hypoalbuminemia Albumin remains low. Likely in the setting of malnutrition with history of the bypass. -Protein supplementation -Encourage increase dietary intake   #Hypothyroidism -Continue home levothyroxine  #Diet -Start low fiber diet   #Normocytic anemia (improving) Hemoglobin on admission 10.2 -> 11.2. No concerns for acute bleed given improvement. Iron panel WNL.    -Monitor CBC  #Hyponatremia (Resolved)  Hyponatremia likely secondary to glucocorticoid insufficiency.  Started steroids, and sodium improved to 134. -Continue with hydrocortisone -Continue to monitor BMP   Best Practice (right click and "Reselect all SmartList Selections" daily)   Diet/type: Regular consistency (see orders) DVT prophylaxis: DOAC GI prophylaxis: N/A Lines: PICC line in place Foley:  N/A Code Status:  full code Last date of multidisciplinary goals of care discussion   Labs   CBC: Recent Labs  Lab 04/17/23 0200 04/17/23 1928 04/18/23 0412 04/19/23 0500 04/20/23 0106  WBC 6.5 4.9 6.8 7.0 9.7  HGB 8.3* 9.6* 9.4* 10.8* 11.2*  HCT 25.3* 29.7* 28.0* 33.1* 35.2*  MCV 96.2 96.4 95.2 94.8 95.9  PLT 163 223 174 227 220     Basic Metabolic Panel: Recent Labs  Lab 04/17/23 0200 04/17/23 1928 04/18/23 0412 04/19/23 0500 04/20/23 0106  NA 125* 125* 125* 134* 135  K 3.3* 4.1 3.9 4.0 4.3  CL 96* 95* 94* 101 103   CO2 23 22 25 27 24   GLUCOSE 108* 186* 135* 171* 99  BUN 5* <5* <5* 8 14  CREATININE 0.50 0.61 0.63 0.71 0.67  CALCIUM 6.8* 6.9* 7.3* 7.3* 7.2*  MG 1.5* 1.9 1.9 1.9 1.8    GFR: Estimated Creatinine Clearance: 57.2 mL/min (by C-G formula based on SCr of 0.67 mg/dL). Recent Labs  Lab 04/15/23 1134 04/15/23 1649 04/16/23 0351 04/17/23 0200 04/17/23 1928 04/18/23 0412 04/19/23 0500 04/20/23 0106  PROCALCITON  --  0.12 0.13  --   --   --   --   --   WBC  --   --  7.3   < > 4.9 6.8 7.0 9.7  LATICACIDVEN 3.5*  --   --   --   --   --   --   --    < > = values in this interval not displayed.     Liver Function Tests: Recent Labs  Lab 04/14/23 0421 04/18/23 0412 04/19/23 0500 04/20/23 0106  AST 15 15 14* 15  ALT 17 16 17 18   ALKPHOS 84 75 85 76  BILITOT 0.6 0.6 0.3 0.4  PROT 3.6* 4.2* 3.9* 3.9*  ALBUMIN <1.5* <1.5* <1.5* <1.5*    No  results for input(s): "LIPASE", "AMYLASE" in the last 168 hours. No results for input(s): "AMMONIA" in the last 168 hours.  ABG    Component Value Date/Time   O2SAT 97 04/15/2023 1141     Coagulation Profile: No results for input(s): "INR", "PROTIME" in the last 168 hours.  Cardiac Enzymes: No results for input(s): "CKTOTAL", "CKMB", "CKMBINDEX", "TROPONINI" in the last 168 hours.  HbA1C: Hgb A1c MFr Bld  Date/Time Value Ref Range Status  08/13/2018 04:01 AM 4.7 (L) 4.8 - 5.6 % Final    Comment:    (NOTE) Pre diabetes:          5.7%-6.4% Diabetes:              >6.4% Glycemic control for   <7.0% adults with diabetes     CBG: Recent Labs  Lab 04/16/23 1933 04/16/23 2324 04/17/23 0329 04/17/23 0751 04/17/23 1102  GLUCAP 98 95 100* 111* 140*     Review of Systems:   Critically ill  Past Medical History:  She,  has a past medical history of Aortic aneurysm (HCC), Arthritis, H/O gastric bypass, Hiatal hernia, Hypothyroidism, and Small bowel obstruction (HCC).   Surgical History:   Past Surgical History:   Procedure Laterality Date   ABDOMINAL HYSTERECTOMY     CHOLECYSTECTOMY     COLON SURGERY     HERNIA REPAIR     LAPAROTOMY N/A 02/02/2023   Procedure: EXPLORATORY LAPAROTOMY, [name], EXPLANT OF ABDOMINAL MESH;  Surgeon: Lucretia Roers, MD;  Location: [facility];  Service: General;  Laterality: N/A;   REPLACEMENT TOTAL KNEE Right    ROTATOR CUFF REPAIR Right    TOTAL HIP ARTHROPLASTY Right      Social History:   reports that she has never smoked. She has never used smokeless tobacco. She reports that she does not drink alcohol and does not use drugs.   Family History:  Her family history includes CAD in her brother and father.   Allergies No Known Allergies   Karrie Doffing, MS4 Rosana Berger

## 2023-04-20 NOTE — Progress Notes (Signed)
Bilateral lower extremity venous duplex has been completed. Preliminary results can be found in CV Proc through chart review.  Results were given to the patient's nurse, Jacquenette Shone.  04/20/23 3:15 PM Olen Cordial RVT

## 2023-04-21 DIAGNOSIS — I959 Hypotension, unspecified: Secondary | ICD-10-CM | POA: Diagnosis not present

## 2023-04-21 DIAGNOSIS — I82451 Acute embolism and thrombosis of right peroneal vein: Secondary | ICD-10-CM

## 2023-04-21 DIAGNOSIS — I2699 Other pulmonary embolism without acute cor pulmonale: Secondary | ICD-10-CM | POA: Diagnosis not present

## 2023-04-21 LAB — COMPREHENSIVE METABOLIC PANEL
ALT: 15 U/L (ref 0–44)
AST: 12 U/L — ABNORMAL LOW (ref 15–41)
Albumin: <1.5 g/dL — ABNORMAL LOW (ref 3.5–5.0)
Alkaline Phosphatase: 63 U/L (ref 38–126)
Anion gap: 5 (ref 5–15)
BUN: 10 mg/dL (ref 8–23)
CO2: 28 mmol/L (ref 22–32)
Calcium: 7.1 mg/dL — ABNORMAL LOW (ref 8.9–10.3)
Chloride: 102 mmol/L (ref 98–111)
Creatinine, Ser: 0.54 mg/dL (ref 0.44–1.00)
GFR, Estimated: >60 mL/min (ref >60)
Glucose, Bld: 103 mg/dL — ABNORMAL HIGH (ref 70–99)
Potassium: 3.8 mmol/L (ref 3.5–5.1)
Sodium: 135 mmol/L (ref 135–145)
Total Bilirubin: 0.5 mg/dL (ref 0.3–1.2)
Total Protein: 3.5 g/dL — ABNORMAL LOW (ref 6.5–8.1)

## 2023-04-21 LAB — CBC
HCT: 29.7 % — ABNORMAL LOW (ref 36.0–46.0)
HCT: 31.4 % — ABNORMAL LOW (ref 36.0–46.0)
Hemoglobin: 9.5 g/dL — ABNORMAL LOW (ref 12.0–15.0)
Hemoglobin: 10 g/dL — ABNORMAL LOW (ref 12.0–15.0)
MCH: 30.4 pg (ref 26.0–34.0)
MCH: 31.2 pg (ref 26.0–34.0)
MCHC: 32 g/dL (ref 30.0–36.0)
MCHC: 31.8 g/dL (ref 30.0–36.0)
MCV: 94.9 fL (ref 80.0–100.0)
MCV: 97.8 fL (ref 80.0–100.0)
Platelets: 172 10*3/uL (ref 150–400)
Platelets: 220 10*3/uL (ref 150–400)
RBC: 3.13 MIL/uL — ABNORMAL LOW (ref 3.87–5.11)
RBC: 3.21 MIL/uL — ABNORMAL LOW (ref 3.87–5.11)
RDW: 18.6 % — ABNORMAL HIGH (ref 11.5–15.5)
RDW: 18.6 % — ABNORMAL HIGH (ref 11.5–15.5)
WBC: 3 10*3/uL — ABNORMAL LOW (ref 4.0–10.5)
WBC: 5.5 10*3/uL (ref 4.0–10.5)
nRBC: 0 % (ref 0.0–0.2)
nRBC: 0 % (ref 0.0–0.2)

## 2023-04-21 LAB — GLUCOSE, CAPILLARY: Glucose-Capillary: 128 mg/dL — ABNORMAL HIGH (ref 70–99)

## 2023-04-21 NOTE — Progress Notes (Addendum)
NAME:  Kayla Dean, MRN:  [id number], DOB:  [birth year], LOS: 9 ADMISSION DATE:  04/12/2023, CONSULTATION DATE:  04/15/2023 REFERRING MD:  [facility] , CHIEF COMPLAINT:  shock    History of Present Illness:   72 yo FM, PMH h/o gastric bypass, hiatal hernia, hypothyroid. Has a history of gastric bypass, s/p exploratory laprotomy, reduction, of previous incarcerated hernia. Patient presented to her follow up appointment with her provider and found to have hypotension and sent to the emergency department with concerns of hypotension. Patient admitted for further evaluation and management of hypotension.   Pertinent  Medical History   Past Medical History:  Diagnosis Date   Aortic aneurysm (HCC)    Arthritis    H/O gastric bypass    Hiatal hernia    Hypothyroidism    Small bowel obstruction (HCC)     Echo: IMPRESSIONS   1. Left ventricular ejection fraction, by estimation, is 60 to 65%. The  left ventricle has normal function. The left ventricle has no regional  wall motion abnormalities. Left ventricular diastolic parameters were  normal.   2. Right ventricular systolic function is normal. The right ventricular  size is normal.   3. Left atrial size was mildly dilated.   4. The mitral valve is abnormal. Trivial mitral valve regurgitation. No  evidence of mitral stenosis.   5. The aortic valve is tricuspid. There is moderate calcification of the  aortic valve. There is moderate thickening of the aortic valve. Aortic  valve regurgitation is mild. Aortic valve sclerosis is present, with no  evidence of aortic valve stenosis.   6. The inferior vena cava is normal in size with greater than 50%  respiratory variability, suggesting right atrial pressure of 3 mmHg.   US Abdomen (7/3) IMPRESSION 1. Diffuse increased echogenicity of the hepatic parenchyma is a nonspecific indicator of hepatocellular dysfunction, most commonly steatosis. 2. No liver lesion identified.   Significant Hospital  Events: Including procedures, antibiotic start and stop dates in addition to other pertinent events   7/1 - still requiring pressors 7/2 - on pressors 7/4 - completed Zosyn   Interim History / Subjective:  Patient evaluated bedside this morning.   Objective   Blood pressure (!) 83/59, pulse (!) 48, temperature 97.8 F (36.6 C), temperature source Oral, resp. rate 14, height 5\' 5"  (1.651 m), weight 62 kg, SpO2 98 %.  Temperature: 97.5-98.5, pulse 48-59, respirations 14-21, MAP 60-70 on 2 of Levophed, satting at 95-97% on room air     Intake/Output Summary (Last 24 hours) at 04/21/2023 0843 Last data filed at 04/20/2023 2200 Gross per 24 hour  Intake 877.51 ml  Output 700 ml  Net 177.51 ml   Filed Weights   04/15/23 1445 04/19/23 0500 04/21/23 0417  Weight: 63.4 kg 62.8 kg 62 kg    Examination: General: Elderly, frail, interactive, resting in bed in no acute distress HENT: Moist oral mucosa Lungs: Bibasilar crackles appreciated Cardiovascular: S1-S2 appreciated with no murmur, rubs, or gallops Abdomen: Soft, nontender, normoactive bowel sounds Extremities: 1+ Edema appreciated to bilateral lower extremities, 2+ edema appreciated to LUE Neuro: Alert and oriented x 3, moving all extremities  Labs reviewed 07/05 CMP: Sodium 135, potassium 3.8, creatinine 1.54, albumin less than 1.5 CBC showing: White cell count 3.0 (9.7), hemoglobin 9.5 (11.2), platelet 172 (220)  Ferritin 158, iron 40  Orthostatic yesterday  supine 118/69 (83), heart rate 51 Standing 80/63 (70)   Blood culture no growth at 5 days  Lower extremity ultrasound: Showing  evidence of acute deep vein thrombosis right peroneal vein Resolved Hospital Problem list    Assessment & Plan:   #Hypotension Patient was weaned off Levophed all day yesterday, and through the night.  Maps are hovering around mid 60s to high 60s.  This morning, patient did become hypotensive and Levophed was started. Likely due to  intravascular volume down with third spacing due to decreased oncotic pressure.  Infection has been treated.  Do not think this is cardiogenic in nature with most recent echo showing ejection fraction of 60 to 65%. -Continue Levophed to MAP goal of 65 -Continue midodrine 15 mg 3 times daily -Trial weaning pressors off today -Cultures no growth (x5 days)  -PT/OT following -Encourage mobility -s/p Zosyn (6/30 - 7/4) -Patient is becoming more fluid overloaded, could benefit from Lasix, but would drop pressures.  #Subsegmental right sided pulmonary embolism #Deep vein thrombosis right femoral vein -Continue with Eliquis 10 mg BID for 7 days (7/2 - 7/9) followed by 5 mg twice daily -Not contributing hemodynamically  #Hypoalbuminemia Albumin remains low. Likely in the setting of malnutrition with history of the bypass.  Causing decreased oncotic pressures, causing patient to third space.  Patient seems volume overloaded on exam. -Protein supplementation -Encourage increase dietary intake   #Hypothyroidism -Continue home levothyroxine  #Diet -Start low fiber diet   #Normocytic anemia (improving) #Leukopenia White count down to 3.0 today, wonder if this is lab error.  Will repeat CBC.  Hemoglobin down to 9.5 this morning.  Patient does have any signs of bleeding this morning.  Monitor hemoglobin.  Patient did have decreased iron stores, and could benefit from IV iron.  -Monitor CBC -Consider IV iron -CBC pending, if still leukopenic,  can obtain smear   #Hyponatremia (Resolved)  Hyponatremia resolved.  Sodium 135.  No acute concerns.  Best Practice (right click and "Reselect all SmartList Selections" daily)   Diet/type: Regular consistency (see orders) DVT prophylaxis: DOAC GI prophylaxis: N/A Lines: PICC line in place Foley:  N/A Code Status:  full code Last date of multidisciplinary goals of care discussion   Labs   CBC: Recent Labs  Lab 04/17/23 1928 04/18/23 0412  04/19/23 0500 04/20/23 0106 04/21/23 0406  WBC 4.9 6.8 7.0 9.7 3.0*  HGB 9.6* 9.4* 10.8* 11.2* 9.5*  HCT 29.7* 28.0* 33.1* 35.2* 29.7*  MCV 96.4 95.2 94.8 95.9 94.9  PLT 223 174 227 220 172    Basic Metabolic Panel: Recent Labs  Lab 04/17/23 1928 04/18/23 0412 04/19/23 0500 04/20/23 0106 04/20/23 1432 04/21/23 0406  NA 125* 125* 134* 135  --  135  K 4.1 3.9 4.0 4.3  --  3.8  CL 95* 94* 101 103  --  102  CO2 22 25 27 24   --  28  GLUCOSE 186* 135* 171* 99  --  103*  BUN <5* <5* 8 14  --  10  CREATININE 0.61 0.63 0.71 0.67  --  0.54  CALCIUM 6.9* 7.3* 7.3* 7.2*  --  7.1*  MG 1.9 1.9 1.9 1.8 1.7  --    GFR: Estimated Creatinine Clearance: 57.2 mL/min (by C-G formula based on SCr of 0.54 mg/dL). Recent Labs  Lab 04/15/23 1134 04/15/23 1649 04/16/23 0351 04/17/23 0200 04/18/23 0412 04/19/23 0500 04/20/23 0106 04/21/23 0406  PROCALCITON  --  0.12 0.13  --   --   --   --   --   WBC  --   --  7.3   < > 6.8 7.0 9.7 3.0*  LATICACIDVEN 3.5*  --   --   --   --   --   --   --    < > = values in this interval not displayed.    Liver Function Tests: Recent Labs  Lab 04/18/23 0412 04/19/23 0500 04/20/23 0106 04/21/23 0406  AST 15 14* 15 12*  ALT 16 17 18 15   ALKPHOS 75 85 76 63  BILITOT 0.6 0.3 0.4 0.5  PROT 4.2* 3.9* 3.9* 3.5*  ALBUMIN <1.5* <1.5* <1.5* <1.5*   No results for input(s): "LIPASE", "AMYLASE" in the last 168 hours. No results for input(s): "AMMONIA" in the last 168 hours.  ABG    Component Value Date/Time   O2SAT 97 04/15/2023 1141     Coagulation Profile: No results for input(s): "INR", "PROTIME" in the last 168 hours.  Cardiac Enzymes: No results for input(s): "CKTOTAL", "CKMB", "CKMBINDEX", "TROPONINI" in the last 168 hours.  HbA1C: Hgb A1c MFr Bld  Date/Time Value Ref Range Status  08/13/2018 04:01 AM 4.7 (L) 4.8 - 5.6 % Final    Comment:    (NOTE) Pre diabetes:          5.7%-6.4% Diabetes:              >6.4% Glycemic control for    <7.0% adults with diabetes     CBG: Recent Labs  Lab 04/16/23 1933 04/16/23 2324 04/17/23 0329 04/17/23 0751 04/17/23 1102  GLUCAP 98 95 100* 111* 140*    Review of Systems:   Critically ill  Past Medical History:  She,  has a past medical history of Aortic aneurysm (HCC), Arthritis, H/O gastric bypass, Hiatal hernia, Hypothyroidism, and Small bowel obstruction (HCC).   Surgical History:   Past Surgical History:  Procedure Laterality Date   ABDOMINAL HYSTERECTOMY     CHOLECYSTECTOMY     COLON SURGERY     HERNIA REPAIR     LAPAROTOMY N/A 02/02/2023   Procedure: EXPLORATORY LAPAROTOMY, [name], EXPLANT OF ABDOMINAL MESH;  Surgeon: Lucretia Roers, MD;  Location: [facility];  Service: General;  Laterality: N/A;   REPLACEMENT TOTAL KNEE Right    ROTATOR CUFF REPAIR Right    TOTAL HIP ARTHROPLASTY Right      Social History:   reports that she has never smoked. She has never used smokeless tobacco. She reports that she does not drink alcohol and does not use drugs.   Family History:  Her family history includes CAD in her brother and father.   Allergies No Known Allergies   Modena Slater  Internal Medicine Resident PGY-2 [phone]

## 2023-04-22 ENCOUNTER — Inpatient Hospital Stay (HOSPITAL_COMMUNITY): Payer: Medicare Other

## 2023-04-22 DIAGNOSIS — E861 Hypovolemia: Secondary | ICD-10-CM

## 2023-04-22 DIAGNOSIS — I2699 Other pulmonary embolism without acute cor pulmonale: Secondary | ICD-10-CM | POA: Diagnosis not present

## 2023-04-22 LAB — BASIC METABOLIC PANEL
Anion gap: 5 (ref 5–15)
BUN: 7 mg/dL — ABNORMAL LOW (ref 8–23)
CO2: 27 mmol/L (ref 22–32)
Calcium: 7.2 mg/dL — ABNORMAL LOW (ref 8.9–10.3)
Chloride: 107 mmol/L (ref 98–111)
Creatinine, Ser: 0.64 mg/dL (ref 0.44–1.00)
GFR, Estimated: >60 mL/min (ref >60)
Glucose, Bld: 101 mg/dL — ABNORMAL HIGH (ref 70–99)
Potassium: 3.6 mmol/L (ref 3.5–5.1)
Sodium: 139 mmol/L (ref 135–145)

## 2023-04-22 LAB — CBC
HCT: 29.2 % — ABNORMAL LOW (ref 36.0–46.0)
Hemoglobin: 9.3 g/dL — ABNORMAL LOW (ref 12.0–15.0)
MCH: 30.7 pg (ref 26.0–34.0)
MCHC: 31.8 g/dL (ref 30.0–36.0)
MCV: 96.4 fL (ref 80.0–100.0)
Platelets: 186 10*3/uL (ref 150–400)
RBC: 3.03 MIL/uL — ABNORMAL LOW (ref 3.87–5.11)
RDW: 18.2 % — ABNORMAL HIGH (ref 11.5–15.5)
WBC: 5.6 10*3/uL (ref 4.0–10.5)
nRBC: 0 % (ref 0.0–0.2)

## 2023-04-22 MED ORDER — PANTOPRAZOLE SODIUM 40 MG PO TBEC
40.0000 mg | DELAYED_RELEASE_TABLET | Freq: Every day | ORAL | Status: DC
  Administered 2023-04-22 – 2023-04-25 (×4): 40 mg via ORAL
  Filled 2023-04-22 (×4): qty 1

## 2023-04-22 MED ORDER — FENTANYL CITRATE PF 50 MCG/ML IJ SOSY
50.0000 ug | PREFILLED_SYRINGE | INTRAMUSCULAR | Status: DC | PRN
  Administered 2023-04-22: 50 ug via INTRAVENOUS
  Filled 2023-04-22: qty 1

## 2023-04-22 MED ORDER — POTASSIUM CHLORIDE CRYS ER 20 MEQ PO TBCR
40.0000 meq | EXTENDED_RELEASE_TABLET | Freq: Once | ORAL | Status: AC
  Administered 2023-04-22: 40 meq via ORAL
  Filled 2023-04-22: qty 2

## 2023-04-22 MED ORDER — SODIUM CHLORIDE 0.9 % IV BOLUS
1000.0000 mL | Freq: Once | INTRAVENOUS | Status: AC
  Administered 2023-04-22: 1000 mL via INTRAVENOUS

## 2023-04-22 NOTE — Progress Notes (Signed)
[facility] ADULT ICU REPLACEMENT PROTOCOL   The patient does apply for the [facility] Adult ICU Electrolyte Replacment Protocol based on the criteria listed below:   1.Exclusion criteria: TCTS, ECMO, Dialysis, and Myasthenia Gravis patients 2. Is GFR >/= 30 ml/min? Yes.    Patient's GFR today is >60 3. Is SCr </= 2? Yes.   Patient's SCr is 0.64 mg/dL 4. Did SCr increase >/= 0.5 in 24 hours? No. 5.Pt's weight >40kg  Yes.   6. Abnormal electrolyte(s): Potassium 3.6  7. Electrolytes replaced per protocol 8.  Call MD STAT for K+ </= 2.5, Phos </= 1, or Mag </= 1 Physician:  Dr. Jones Broom A Lota Leamer 04/22/2023 5:14 AM

## 2023-04-22 NOTE — Progress Notes (Signed)
NAME:  Kayla Dean, MRN:  [id number], DOB:  December 17, 1950, LOS: 10 ADMISSION DATE:  04/12/2023, CONSULTATION DATE:  04/15/2023 REFERRING MD:  [facility] , CHIEF COMPLAINT:  shock    History of Present Illness:   72 yo FM, PMH h/o gastric bypass, hiatal hernia, hypothyroid. Has a history of gastric bypass, s/p exploratory laprotomy, reduction, of previous incarcerated hernia. Patient presented to her follow up appointment with her provider and found to have hypotension and sent to the emergency department with concerns of hypotension. Patient admitted for further evaluation and management of hypotension.   Pertinent  Medical History  She  has a past medical history of Aortic aneurysm (HCC), Arthritis, H/O gastric bypass, Hiatal hernia, Hypothyroidism, and Small bowel obstruction (HCC).  Significant Hospital Events: Including procedures, antibiotic start and stop dates in addition to other pertinent events   7/1 - still requiring pressors 7/2 - on pressors 7/4 - completed Zosyn  7/6 - off pressors 7/7 - increased abdominal pain  Interim History / Subjective:  Increased abdominal pain.  Objective   Blood pressure 101/63, pulse 65, temperature 98.6 F (37 C), temperature source Oral, resp. rate 15, height 5\' 5"  (1.651 m), weight 61.3 kg, SpO2 98 %.  Temperature: 97.5-98.5, pulse 48-59, respirations 14-21, MAP 60-70 on 2 of Levophed, satting at 95-97% on room air     Intake/Output Summary (Last 24 hours) at 04/22/2023 4098 Last data filed at 04/21/2023 2100 Gross per 24 hour  Intake 128.97 ml  Output --  Net 128.97 ml   Filed Weights   04/19/23 0500 04/21/23 0417 04/22/23 0500  Weight: 62.8 kg 62 kg 61.3 kg    Examination:  General - alert Eyes - pupils reactive ENT - no sinus tenderness, no stridor Cardiac - regular rate/rhythm, no murmur Chest - equal breath sounds b/l, no wheezing or rales Abdomen - soft, tender in epigastrium, + bowel sounds Extremities - Lt hand swollen Skin -  no rashes Neuro - normal strength, moves extremities, follows commands Psych - normal mood and behavior  Resolved Hospital Problem list    Assessment & Plan:   Hypotension. - unclear cause - wean levophed to keep MAP > 60, SBP > 85 - continue solu cortef 100 mg q12h for now - continue midodrine 15 mg tid - NS 1 liter fluid bolus on 7/07   Acute pulmonary embolism with acute Rt peroneal vein DVT - continue eliquis   Hypothyroidism. - continue synthroid   Lt hand swelling after arterial line placement. - monitor clinically  Abdominal pain developed 7/07. - CT abd/pelvis from 04/13/23 showed changes consistent with chronic mesenteric ischemia - prn fentanyl - if pain progress, then repeat CT abd/pelvis and consider re-consulting surgery  Best Practice (right click and "Reselect all SmartList Selections" daily)   Diet/type: Regular consistency (see orders) DVT prophylaxis: DOAC GI prophylaxis: N/A Lines: PICC line in place Foley:  N/A Code Status:  full code Last date of multidisciplinary goals of care discussion   Labs       Latest Ref Rng & Units 04/22/2023    4:15 AM 04/21/2023    4:06 AM 04/20/2023    1:06 AM  CMP  Glucose 70 - 99 mg/dL 119  147  99   BUN 8 - 23 mg/dL 7  10  14    Creatinine 0.44 - 1.00 mg/dL 8.29  5.62  1.30   Sodium 135 - 145 mmol/L 139  135  135   Potassium 3.5 - 5.1 mmol/L 3.6  3.8  4.3   Chloride 98 - 111 mmol/L 107  102  103   CO2 22 - 32 mmol/L 27  28  24    Calcium 8.9 - 10.3 mg/dL 7.2  7.1  7.2   Total Protein 6.5 - 8.1 g/dL  3.5  3.9   Total Bilirubin 0.3 - 1.2 mg/dL  0.5  0.4   Alkaline Phos 38 - 126 U/L  63  76   AST 15 - 41 U/L  12  15   ALT 0 - 44 U/L  15  18       Latest Ref Rng & Units 04/22/2023    4:15 AM 04/21/2023    9:21 AM 04/21/2023    4:06 AM  CBC  WBC 4.0 - 10.5 K/uL 5.6  5.5  3.0   Hemoglobin 12.0 - 15.0 g/dL 9.3  14.7  9.5   Hematocrit 36.0 - 46.0 % 29.2  31.4  29.7   Platelets 150 - 400 K/uL 186  220  172     ABG     Component Value Date/Time   O2SAT 97 04/15/2023 1141    CBG (last 3)  Recent Labs    04/21/23 1952  GLUCAP 128*   Critical care time: 36 minutes  Coralyn Helling, MD [facility] Pager - [phone] or [phone] 04/22/2023, 9:29 AM

## 2023-04-23 DIAGNOSIS — I2699 Other pulmonary embolism without acute cor pulmonale: Secondary | ICD-10-CM | POA: Diagnosis not present

## 2023-04-23 DIAGNOSIS — I82451 Acute embolism and thrombosis of right peroneal vein: Secondary | ICD-10-CM | POA: Diagnosis not present

## 2023-04-23 DIAGNOSIS — E440 Moderate protein-calorie malnutrition: Secondary | ICD-10-CM

## 2023-04-23 LAB — BASIC METABOLIC PANEL
Anion gap: 6 (ref 5–15)
BUN: 10 mg/dL (ref 8–23)
CO2: 26 mmol/L (ref 22–32)
Calcium: 7.3 mg/dL — ABNORMAL LOW (ref 8.9–10.3)
Chloride: 106 mmol/L (ref 98–111)
Creatinine, Ser: 0.57 mg/dL (ref 0.44–1.00)
GFR, Estimated: >60 mL/min (ref >60)
Glucose, Bld: 122 mg/dL — ABNORMAL HIGH (ref 70–99)
Potassium: 3.8 mmol/L (ref 3.5–5.1)
Sodium: 138 mmol/L (ref 135–145)

## 2023-04-23 LAB — CBC
HCT: 30.1 % — ABNORMAL LOW (ref 36.0–46.0)
Hemoglobin: 9.5 g/dL — ABNORMAL LOW (ref 12.0–15.0)
MCH: 30.9 pg (ref 26.0–34.0)
MCHC: 31.6 g/dL (ref 30.0–36.0)
MCV: 98 fL (ref 80.0–100.0)
Platelets: 190 10*3/uL (ref 150–400)
RBC: 3.07 MIL/uL — ABNORMAL LOW (ref 3.87–5.11)
RDW: 18.2 % — ABNORMAL HIGH (ref 11.5–15.5)
WBC: 5.9 10*3/uL (ref 4.0–10.5)
nRBC: 0 % (ref 0.0–0.2)

## 2023-04-23 LAB — GLUCOSE, CAPILLARY: Glucose-Capillary: 143 mg/dL — ABNORMAL HIGH (ref 70–99)

## 2023-04-23 MED ORDER — POTASSIUM CHLORIDE CRYS ER 20 MEQ PO TBCR
40.0000 meq | EXTENDED_RELEASE_TABLET | Freq: Once | ORAL | Status: AC
  Administered 2023-04-23: 40 meq via ORAL
  Filled 2023-04-23: qty 2

## 2023-04-23 MED ORDER — HYDROCORTISONE SOD SUC (PF) 100 MG IJ SOLR
100.0000 mg | Freq: Every day | INTRAMUSCULAR | Status: DC
  Administered 2023-04-24: 100 mg via INTRAVENOUS
  Filled 2023-04-23 (×2): qty 2

## 2023-04-23 NOTE — Progress Notes (Signed)
Pt arrived to room 4E16 via wheelchair from 75M. Received report from [place], [place]. See assessment. Will continue to monitor.

## 2023-04-23 NOTE — Progress Notes (Signed)
NAME:  Kayla Dean, MRN:  [id number], DOB:  [birth year], LOS: 11 ADMISSION DATE:  04/12/2023, CONSULTATION DATE:  04/15/2023 REFERRING MD:  [facility] , CHIEF COMPLAINT:  shock    History of Present Illness:   72 yo FM, PMH h/o gastric bypass, hiatal hernia, hypothyroid. Has a history of gastric bypass, s/p exploratory laprotomy, reduction, of previous incarcerated hernia. Patient presented to her follow up appointment with her provider and found to have hypotension and sent to the emergency department with concerns of hypotension. Patient admitted for further evaluation and management of hypotension.   Pertinent  Medical History  She  has a past medical history of Aortic aneurysm (HCC), Arthritis, H/O gastric bypass, Hiatal hernia, Hypothyroidism, and Small bowel obstruction (HCC).  Significant Hospital Events: Including procedures, antibiotic start and stop dates in addition to other pertinent events   7/1 - still requiring pressors 7/2 - on pressors 7/4 - completed Zosyn  7/6 - off pressors 7/7 - increased abdominal pain  Interim History / Subjective:  Overnight no acute events.  Patient evaluated bedside this morning.  Patient states she is doing much better.  She denies any abdominal pain today.  She states she has been eating and drinking well.  She has been up and go to the bathroom with no concerns.  She wants to walk laps today.  Objective   Blood pressure 120/70, pulse (!) 45, temperature 97.8 F (36.6 C), temperature source Oral, resp. rate 12, height 5\' 5"  (1.651 m), weight 62.1 kg, SpO2 96 %.  Temperature 97.5-98.6 F, pulse 45-96, respiration rate 11-15, blood pressure MAP 73-85 off pressors, oxygen saturation 96-97% on room air    No intake or output data in the 24 hours ending 04/23/23 0801  Filed Weights   04/21/23 0417 04/22/23 0500 04/23/23 0500  Weight: 62 kg 61.3 kg 62.1 kg    Examination:  General - alert, interactive, able to follow all instructions, no acute  distress Cardiac - regular rate/rhythm, no murmur Chest -bibasilar crackles appreciated, no wheezing Abdomen -some discomfort on palpation, but no tenderness, soft, normal active bowel sounds Extremities -left hand edema improving Skin -bruising noted to left upper extremity Neuro - normal strength, moves extremities, follows commands Psych - normal mood and behavior  Labs reviewed: CBC: WBC 5.9, hemoglobin 9.5, medic at 30.1, platelet 190 BMP: Sodium 138, potassium 3.8, bicarb 26, creatinine 0.57  KUB 04/22/2023: Nonobstructive bowel gas pattern  Resolved Hospital Problem list    Assessment & Plan:   Hypotension. Patient was admitted for unclear etiology of hypotension.  Initial thought was likely related to sepsis, but treated appropriately with antibiotics and still did not improve.  Second that was likely due to decreased oncotic pressure due to history of gastric bypass leading to decreased protein absorption.  There is also concern that acute PE was causing hemodynamic instability, but was less likely.  With time, blood pressure improved.  Patient is now off of Levophed. -Decrease hydrocortisone 100 mg daily -Continue midodrine 15 mg 3 times daily -Continue to monitor blood pressure closely -Can transfer to floor today if pressure maintains    Acute pulmonary embolism with acute Rt peroneal vein DVT No acute concerns at this time.  Has 2 more days of Eliquis 10 mg twice daily, then can transition to Eliquis 5 mg twice daily. -Continue Eliquis   Hypothyroidism. No acute concerns. -Continue Synthroid 137 mcg daily   Lt hand swelling after arterial line placement. No acute concerns this time. -Improving  Abdominal  pain developed 7/07 History of chronic mesenteric ischemia Pain resolved.  Thought to be from chronic mesenteric ischemia.  Patient is pacing herself when eating, and pain is improved -KUB showed no obstructive pattern -Continue to monitor  Best Practice  (right click and "Reselect all SmartList Selections" daily)   Diet/type: Regular consistency (see orders) DVT prophylaxis: DOAC GI prophylaxis: N/A Lines: PICC line in place Foley:  N/A Code Status:  full code Last date of multidisciplinary goals of care discussion   Labs       Latest Ref Rng & Units 04/23/2023    5:20 AM 04/22/2023    4:15 AM 04/21/2023    4:06 AM  CMP  Glucose 70 - 99 mg/dL 782  956  213   BUN 8 - 23 mg/dL 10  7  10    Creatinine 0.44 - 1.00 mg/dL 0.86  5.78  4.69   Sodium 135 - 145 mmol/L 138  139  135   Potassium 3.5 - 5.1 mmol/L 3.8  3.6  3.8   Chloride 98 - 111 mmol/L 106  107  102   CO2 22 - 32 mmol/L 26  27  28    Calcium 8.9 - 10.3 mg/dL 7.3  7.2  7.1   Total Protein 6.5 - 8.1 g/dL   3.5   Total Bilirubin 0.3 - 1.2 mg/dL   0.5   Alkaline Phos 38 - 126 U/L   63   AST 15 - 41 U/L   12   ALT 0 - 44 U/L   15       Latest Ref Rng & Units 04/23/2023    5:20 AM 04/22/2023    4:15 AM 04/21/2023    9:21 AM  CBC  WBC 4.0 - 10.5 K/uL 5.9  5.6  5.5   Hemoglobin 12.0 - 15.0 g/dL 9.5  9.3  62.9   Hematocrit 36.0 - 46.0 % 30.1  29.2  31.4   Platelets 150 - 400 K/uL 190  186  220     ABG    Component Value Date/Time   O2SAT 97 04/15/2023 1141    CBG (last 3)  Recent Labs    04/21/23 1952  GLUCAP 128*   Critical care time: 36 minutes  Modena Slater  Internal Medicine Resident PGY-2 [phone] 04/23/2023, 8:01 AM

## 2023-04-23 NOTE — Progress Notes (Signed)
Occupational Therapy Treatment Patient Details Name: Kayla Dean MRN: [id number] DOB: 06-23-1951 Today's Date: 04/23/2023   History of present illness 72 yo female admitted 6/27 from MD office with hypotension with pressor requirement. Pt with PE and colitis. PMHx; exlap with incarcerated hernia reduction 02/02/23, gastric bypass, Rt THA, Rt TKA, Rt RCR   OT comments  Pt progressing toward therapy goals. Pt motivated to walk laps in hallway upon arrival. Pt requiring supervision A for safety with functional transfers and room/hallway level mobility using RW. Pt limited secondary to tachycardia and soft BP although asymptomatic, vitals listed below. Pt educated on importance of activity pacing and seated rest breaks during activities with pt verbalizing understanding. Pt would benefit from continued acute OT services to maximize functional independence and facilitate transition to pt's natural home environment. Pt does not require follow up OT services upon discharge.  Session vitals: BP supine: 98/65, BP sitting EOB: 112/93, BP standing: 92/70, BP sitting EOB post activity: 97/81, asymptomatic. HR at rest: 57, HR with activity: 130s-142, HR with seated rest: 88, asymptomatic. RN updated/aware.   Recommendations for follow up therapy are one component of a multi-disciplinary discharge planning process, led by the attending physician.  Recommendations may be updated based on patient status, additional functional criteria and insurance authorization.    Assistance Recommended at Discharge Set up Supervision/Assistance  Patient can return home with the following  Assist for transportation;Assistance with cooking/housework   Equipment Recommendations  None recommended by OT    Recommendations for Other Services      Precautions / Restrictions Precautions Precautions: Fall;Other (comment) Precaution Comments: watch HR and BP Restrictions Weight Bearing Restrictions: No       Mobility Bed  Mobility Overal bed mobility: Needs Assistance Bed Mobility: Supine to Sit, Sit to Supine     Supine to sit: Supervision Sit to supine: Supervision   General bed mobility comments: increased time, HOB flat    Transfers Overall transfer level: Needs assistance Equipment used: Rolling walker (2 wheels) Transfers: Sit to/from Stand Sit to Stand: Supervision           General transfer comment: STS transfer from EOB using RW with supervision, room/hallway level mobility using RW with supervision for safety     Balance Overall balance assessment: Needs assistance Sitting-balance support: Feet supported, No upper extremity supported Sitting balance-Leahy Scale: Good Sitting balance - Comments: sitting EOB   Standing balance support: Bilateral upper extremity supported, Reliant on assistive device for balance Standing balance-Leahy Scale: Poor Standing balance comment: BUE support on RW in standing                           ADL either performed or assessed with clinical judgement   ADL Overall ADL's : Needs assistance/impaired                         Toilet Transfer: Supervision/safety;Rolling walker (2 wheels);Regular Toilet;Ambulation Toilet Transfer Details (indicate cue type and reason): simulated         Functional mobility during ADLs: Rolling walker (2 wheels);Supervision/safety General ADL Comments: min cues for standing rest break secondary to increased HR    Extremity/Trunk Assessment Upper Extremity Assessment Upper Extremity Assessment: Overall WFL for tasks assessed   Lower Extremity Assessment Lower Extremity Assessment: Defer to PT evaluation        Vision   Vision Assessment?: No apparent visual deficits   Perception Perception Perception: Not tested  Praxis Praxis Praxis: Not tested    Cognition Arousal/Alertness: Awake/alert Behavior During Therapy: WFL for tasks assessed/performed Overall Cognitive Status: Within  Functional Limits for tasks assessed                                 General Comments: Pleasant and cooperative        Exercises      Shoulder Instructions       General Comments BP supine: 98/65, BP sitting EOB: 112/93, BP standing: 92/70, BP sitting EOB post activity: 97/81, asymptomatic. HR at rest: 57, HR with activity: 130s-142, HR with seated rest: 88, asymptomatic. RN updated/aware. Family present during session.    Pertinent Vitals/ Pain       Pain Assessment Pain Assessment: No/denies pain  Home Living                                          Prior Functioning/Environment              Frequency  Min 2X/week        Progress Toward Goals  OT Goals(current goals can now be found in the care plan section)  Progress towards OT goals: Progressing toward goals  Acute Rehab OT Goals Patient Stated Goal: to walk laps OT Goal Formulation: With patient Time For Goal Achievement: 05/03/23 Potential to Achieve Goals: Good ADL Goals Pt Will Perform Grooming: with modified independence;standing Pt Will Perform Lower Body Dressing: with modified independence;sit to/from stand Pt Will Transfer to Toilet: with modified independence;ambulating;regular height toilet  Plan Discharge plan remains appropriate;Frequency remains appropriate    Co-evaluation                 AM-PAC OT "6 Clicks" Daily Activity     Outcome Measure   Help from another person eating meals?: None Help from another person taking care of personal grooming?: A Little Help from another person toileting, which includes using toliet, bedpan, or urinal?: A Little Help from another person bathing (including washing, rinsing, drying)?: A Little Help from another person to put on and taking off regular upper body clothing?: A Little Help from another person to put on and taking off regular lower body clothing?: A Little 6 Click Score: 19    End of Session  Equipment Utilized During Treatment: Rolling walker (2 wheels)  OT Visit Diagnosis: Unsteadiness on feet (R26.81)   Activity Tolerance Patient tolerated treatment well   Patient Left in bed;with call bell/phone within reach;with family/visitor present   Nurse Communication Mobility status        Time: 1533-1550 OT Time Calculation (min): 17 min  Charges: OT General Charges $OT Visit: 1 Visit OT Treatments $Therapeutic Activity: 8-22 mins  Sherley Bounds, OTS Acute Rehabilitation Services Office [phone] Secure Chat Communication Preferred   Sherley Bounds 04/23/2023, 4:28 PM

## 2023-04-24 DIAGNOSIS — E861 Hypovolemia: Secondary | ICD-10-CM | POA: Diagnosis not present

## 2023-04-24 DIAGNOSIS — I82451 Acute embolism and thrombosis of right peroneal vein: Secondary | ICD-10-CM | POA: Diagnosis not present

## 2023-04-24 DIAGNOSIS — E440 Moderate protein-calorie malnutrition: Secondary | ICD-10-CM | POA: Diagnosis not present

## 2023-04-24 DIAGNOSIS — E039 Hypothyroidism, unspecified: Secondary | ICD-10-CM | POA: Diagnosis not present

## 2023-04-24 LAB — BASIC METABOLIC PANEL
Anion gap: 6 (ref 5–15)
BUN: 10 mg/dL (ref 8–23)
CO2: 26 mmol/L (ref 22–32)
Calcium: 7.2 mg/dL — ABNORMAL LOW (ref 8.9–10.3)
Chloride: 104 mmol/L (ref 98–111)
Creatinine, Ser: 0.66 mg/dL (ref 0.44–1.00)
GFR, Estimated: >60 mL/min (ref >60)
Glucose, Bld: 79 mg/dL (ref 70–99)
Potassium: 4 mmol/L (ref 3.5–5.1)
Sodium: 136 mmol/L (ref 135–145)

## 2023-04-24 LAB — CBC
HCT: 29.4 % — ABNORMAL LOW (ref 36.0–46.0)
Hemoglobin: 9.3 g/dL — ABNORMAL LOW (ref 12.0–15.0)
MCH: 31.1 pg (ref 26.0–34.0)
MCHC: 31.6 g/dL (ref 30.0–36.0)
MCV: 98.3 fL (ref 80.0–100.0)
Platelets: 202 10*3/uL (ref 150–400)
RBC: 2.99 MIL/uL — ABNORMAL LOW (ref 3.87–5.11)
RDW: 18.2 % — ABNORMAL HIGH (ref 11.5–15.5)
WBC: 10.8 10*3/uL — ABNORMAL HIGH (ref 4.0–10.5)
nRBC: 0.2 % (ref 0.0–0.2)

## 2023-04-24 MED ORDER — ACETAMINOPHEN 325 MG PO TABS
650.0000 mg | ORAL_TABLET | ORAL | Status: DC | PRN
  Administered 2023-04-24 – 2023-04-25 (×5): 650 mg via ORAL
  Filled 2023-04-24 (×5): qty 2

## 2023-04-24 NOTE — Care Management Important Message (Signed)
Important Message  Patient Details  Name: Kayla Dean MRN: [id number] Date of Birth: 1951/03/24   Medicare Important Message Given:  Yes     Renie Ora 04/24/2023, 8:32 AM

## 2023-04-24 NOTE — Progress Notes (Signed)
PROGRESS NOTE    LYNNIE TISCHER  ZOX:[id number] DOB: 1951-02-08 DOA: 04/12/2023 PCP: [facility]    Brief Narrative:  72 year old with history of hypothyroidism, recent emergency gastric bypass due to incarcerated hernia who was following up with her regular provider and found to have hypotension and sent to emergency department.  She does have a history of gastric bypass secondary to small bowel obstruction and incarceration of hernia.  She was admitted to ICU initially resuscitated with fluid and then subsequently with vasopressors.  She also received antibiotics.  Started on midodrine and hydrocortisone, blood pressure stabilized and transferred out of the ICU on 7/9.   Assessment & Plan:   Hypotension: Multifactorial.  Suspected volume depletion, ischemic colitis. Sepsis, ruled out.  Antibiotics completed. Patient was treated with high-dose hydrocortisone, dose reduced to 100 mg daily on 7/9.  Will monitor and likely de-escalate tomorrow. On midodrine 15 mg 3 times daily. Wear compression stockings and mobilize around.  Acute pulmonary embolism, acute right peroneal vein DVT: Probably aggravated with recent surgery. Started on Eliquis, tolerating.  Will continue Eliquis maintenance dose on discharge.  Hypothyroidism: On Synthroid.  Continued.  Abdominal pain, chronic mesenteric ischemia: Pain resolved.  Currently stabilized.   DVT prophylaxis: Place TED hose Start: 04/14/23 1931 SCDs Start: 04/13/23 0447 apixaban (ELIQUIS) tablet 5 mg   Code Status: Full code Family Communication: Daughter at bedside Disposition Plan: Status is: Inpatient Remains inpatient appropriate because: Significant low blood pressures, on vasoactive medications     Consultants:  Critical care  Procedures:  None  Antimicrobials:  Completed antibiotics   Subjective: Patient seen and examined in the morning rounds.  Daughter was at the bedside.  Patient herself  denies any complaints.  Denies any nausea vomiting or abdominal pain.  She has developed some swelling of the legs and some superficial ecchymosis.  Denies any dizziness.  Objective: Vitals:   04/23/23 2340 04/24/23 0432 04/24/23 0739 04/24/23 1128  BP: 99/73 105/67 98/69 (!) 86/62  Pulse: (!) 56 83 87 83  Resp: 17 17 15 14   Temp: 97.9 F (36.6 C) 97.8 F (36.6 C) 97.9 F (36.6 C) 97.6 F (36.4 C)  TempSrc: Oral Oral Oral Oral  SpO2: 98% 97% 97% 95%  Weight:  62.3 kg    Height:        Intake/Output Summary (Last 24 hours) at 04/24/2023 1334 Last data filed at 04/24/2023 1309 Gross per 24 hour  Intake 360 ml  Output --  Net 360 ml   Filed Weights   04/22/23 0500 04/23/23 0500 04/24/23 0432  Weight: 61.3 kg 62.1 kg 62.3 kg    Examination:  General exam: Appears calm and comfortable.  On room air Respiratory system: No added sounds. Cardiovascular system: S1 & S2 heard, RRR. No JVD, murmurs, rubs, gallops or clicks.  1+ bilateral pedal edema up to the both knees. Gastrointestinal system: Soft.  Nontender.  Bowel sound present. Central nervous system: Alert and oriented. No focal neurological deficits. Extremities: Symmetric 5 x 5 power.     Data Reviewed: I have personally reviewed following labs and imaging studies  CBC: Recent Labs  Lab 04/21/23 0406 04/21/23 0921 04/22/23 0415 04/23/23 0520 04/24/23 0545  WBC 3.0* 5.5 5.6 5.9 10.8*  HGB 9.5* 10.0* 9.3* 9.5* 9.3*  HCT 29.7* 31.4* 29.2* 30.1* 29.4*  MCV 94.9 97.8 96.4 98.0 98.3  PLT 172 220 186 190 202   Basic Metabolic Panel: Recent Labs  Lab 04/17/23 1928 04/18/23 0412 04/19/23  0500 04/20/23 0106 04/20/23 1432 04/21/23 0406 04/22/23 0415 04/23/23 0520 04/24/23 0545  NA 125* 125* 134* 135  --  135 139 138 136  K 4.1 3.9 4.0 4.3  --  3.8 3.6 3.8 4.0  CL 95* 94* 101 103  --  102 107 106 104  CO2 22 25 27 24   --  28 27 26 26   GLUCOSE 186* 135* 171* 99  --  103* 101* 122* 79  BUN <5* <5* 8 14  --  10  7* 10 10  CREATININE 0.61 0.63 0.71 0.67  --  0.54 0.64 0.57 0.66  CALCIUM 6.9* 7.3* 7.3* 7.2*  --  7.1* 7.2* 7.3* 7.2*  MG 1.9 1.9 1.9 1.8 1.7  --   --   --   --    GFR: Estimated Creatinine Clearance: 57.2 mL/min (by C-G formula based on SCr of 0.66 mg/dL). Liver Function Tests: Recent Labs  Lab 04/18/23 0412 04/19/23 0500 04/20/23 0106 04/21/23 0406  AST 15 14* 15 12*  ALT 16 17 18 15   ALKPHOS 75 85 76 63  BILITOT 0.6 0.3 0.4 0.5  PROT 4.2* 3.9* 3.9* 3.5*  ALBUMIN <1.5* <1.5* <1.5* <1.5*   No results for input(s): "LIPASE", "AMYLASE" in the last 168 hours. No results for input(s): "AMMONIA" in the last 168 hours. Coagulation Profile: No results for input(s): "INR", "PROTIME" in the last 168 hours. Cardiac Enzymes: No results for input(s): "CKTOTAL", "CKMB", "CKMBINDEX", "TROPONINI" in the last 168 hours. BNP (last 3 results) No results for input(s): "PROBNP" in the last 8760 hours. HbA1C: No results for input(s): "HGBA1C" in the last 72 hours. CBG: Recent Labs  Lab 04/21/23 1952 04/23/23 1122  GLUCAP 128* 143*   Lipid Profile: No results for input(s): "CHOL", "HDL", "LDLCALC", "TRIG", "CHOLHDL", "LDLDIRECT" in the last 72 hours. Thyroid Function Tests: No results for input(s): "TSH", "T4TOTAL", "FREET4", "T3FREE", "THYROIDAB" in the last 72 hours. Anemia Panel: No results for input(s): "VITAMINB12", "FOLATE", "FERRITIN", "TIBC", "IRON", "RETICCTPCT" in the last 72 hours. Sepsis Labs: No results for input(s): "PROCALCITON", "LATICACIDVEN" in the last 168 hours.  Recent Results (from the past 240 hour(s))  Culture, blood (Routine X 2) w Reflex to ID Panel     Status: None   Collection Time: 04/15/23  4:30 PM   Specimen: BLOOD  Result Value Ref Range Status   Specimen Description BLOOD A-LINE RIGHT  Final   Special Requests   Final    BOTTLES DRAWN AEROBIC AND ANAEROBIC Blood Culture adequate volume   Culture   Final    NO GROWTH 5 DAYS Performed at [facility], [address], [place], [place] [address]    Report Status 04/20/2023 FINAL  Final  Culture, blood (Routine X 2) w Reflex to ID Panel     Status: None   Collection Time: 04/15/23  4:49 PM   Specimen: BLOOD RIGHT HAND  Result Value Ref Range Status   Specimen Description BLOOD RIGHT HAND  Final   Special Requests   Final    BOTTLES DRAWN AEROBIC AND ANAEROBIC Blood Culture adequate volume   Culture   Final    NO GROWTH 5 DAYS Performed at [facility], [address] [place], [place] [address]    Report Status 04/20/2023 FINAL  Final         Radiology Studies: No results found.      Scheduled Meds:  apixaban  5 mg Oral BID   calcium carbonate  200  mg of elemental calcium Oral TID   Chlorhexidine Gluconate Cloth  6 each Topical Daily   feeding supplement  1 Container Oral TID BM   hydrocortisone sod succinate (SOLU-CORTEF) inj  100 mg Intravenous Daily   levothyroxine  137 mcg Oral Q0600   midodrine  15 mg Oral Q8H   multivitamin with minerals  1 tablet Oral BID   pantoprazole  40 mg Oral Q1200   sodium chloride flush  10-40 mL Intracatheter Q12H   Continuous Infusions:  sodium chloride Stopped (04/14/23 0944)     LOS: 12 days    Time spent: 35 minutes    Dorcas Carrow, MD [facility]

## 2023-04-24 NOTE — Plan of Care (Signed)
  Problem: Health Behavior/Discharge Planning: Goal: Ability to manage health-related needs will improve Outcome: Progressing   

## 2023-04-24 NOTE — Progress Notes (Signed)
Mobility Specialist Progress Note:   04/24/23 1705  Mobility  Activity Ambulated with assistance in hallway  Level of Assistance Contact guard assist, steadying assist  Assistive Device Front wheel walker  Distance Ambulated (ft) 450 ft  Activity Response Tolerated well  Mobility Referral Yes  $Mobility charge 1 Mobility  Mobility Specialist Start Time (ACUTE ONLY) 1530  Mobility Specialist Stop Time (ACUTE ONLY) 1550  Mobility Specialist Time Calculation (min) (ACUTE ONLY) 20 min    Pre Mobility: 93 HR , 86/66 BP  During Mobility: 138 HR  Post Mobility: 103 HR , 83/71 BP  Pt received in bed, agreeable to mobility. Requesting to use BR before ambulation. Denied any feelings of pain or discomfort during ambulation even though HR elevated. Asymptomatic throughout. Pt returned to bed with call bell in hand and all needs met.   Leory Plowman  Mobility Specialist Please contact via [employer] office at [phone]

## 2023-04-24 NOTE — Progress Notes (Signed)
Patient's HR elevated to 140s when walking to the bathroom. HR returned to normal once pt back in bed. Patient asymptomatic

## 2023-04-24 NOTE — TOC Progression Note (Signed)
Transition of Care (TOC) - Progression Note  Donn Pierini RN, BSN Transitions of Care Unit 4E- RN Case Manager See Treatment Team for direct phone #   Patient Details  Name: LESTA CARTA MRN: [id number] Date of Birth: [birth year]  Transition of Care [facility]) CM/SW Contact  Zenda Alpers Lenn Sink, RN Phone Number: 04/24/2023, 2:47 PM  Clinical Narrative:    Cm spoke with pt at bedside to discuss transition plans. CM had received msg from bedside RN that pt was thinking about rehab vs Home w/ HH.  Per PT/OT evals recommendations are for [facility].   Pt voiced that she was thinking about rehab vs home w/ HH- explained to pt that under current recommendations by PT/OT her insurance is not likely to approval a rehab stay as she does not need that level of care at this time.   Per conversation with pt and family at bedside- daughters are able to assist pt on discharge- pt voiced that she would prefer to return to her home- however daughter is also thinking of having pt stay with her or even going between the homes. Discussed HH services with pt - who is agreeable- explained to pt that should she go between homes she will need to choose one home to receive [facility] services at - as agency would not come to different addresses- pt voiced understanding. List provided for [facility] choice- Per CMS guidelines from [employer] website with star ratings (copy placed in shadow chart)- pt voiced that she would like to check with someone she knows about the different agencies- Cm will check back on [facility] choice.   Discussed DME needs- pt voiced she has a RW available to borrow. No other DME needs noted.   Address, phone # and PCP confirmed in epic- Pt voiced her PCP at clinic left and she is to see a new one but does not know who yet. She also provided a land line phone #- [phone].   CM to follow up tomorrow for [facility] needs and referral. Msg sent to MD for HHPT order.     Expected Discharge Plan: Home w Home Health  Services Barriers to Discharge: Barriers Resolved  Expected Discharge Plan and Services   Discharge Planning Services: CM Consult Post Acute Care Choice: Home Health Living arrangements for the past 2 months: Single Family Home                 DME Arranged: N/A DME Agency: NA       HH Arranged: PT           Social Determinants of Health (SDOH) Interventions SDOH Screenings   Food Insecurity: No Food Insecurity (04/13/2023)  Housing: Low Risk  (04/13/2023)  Transportation Needs: No Transportation Needs (04/13/2023)  Utilities: Not At Risk (04/13/2023)  Tobacco Use: Low Risk  (04/12/2023)    Readmission Risk Interventions     No data to display

## 2023-04-24 NOTE — Progress Notes (Signed)
Nutrition Follow-up  DOCUMENTATION CODES:   Non-severe (moderate) malnutrition in context of chronic illness  INTERVENTION:  Continue Boost Breeze po TID, each supplement provides 250 kcal and 9 grams of protein  Magic cup TID with meals, each supplement provides 290 kcal and 9 grams of protein  Continue GI SOFT diet   Encourage po intake   NUTRITION DIAGNOSIS:   Moderate Malnutrition related to chronic illness (gastric bypass, incarcerated hernia) as evidenced by mild fat depletion, moderate muscle depletion, mild muscle depletion, edema. -ongoing   GOAL:   Patient will meet greater than or equal to 90% of their needs -progressing with po diet and ONS   MONITOR:   PO intake, Supplement acceptance, Labs, Weight trends  REASON FOR ASSESSMENT:   Consult Assessment of nutrition requirement/status (Prior gastric surgery, hypoalbuminemia, hypotension. Low albumin. Likes wild berry boosts but cannot find them in store for at home use. Needs education in high protein meals. Thanks!)  ASSESSMENT:   Pt admitted with shock d/t acute pulmonary embolism. PMH significant for gastric bypass, hiatal hernia, s/p exlap and reduction of previous incarcerated hernia, hypothyroidism.  Patient not available at time of visit. RD reviewed chart. Patient seems to be progressing well and pacing herself with PO intake which explains why she is eating ~40% on average, however, she is tolerating her po diet.   Per MAR, patient has drank 2 out of 3 ordered boost breeze in the past 24 hrs.   No noted N/V.   Labs: reviewed  Meds: tums, boost breeze TID, MVI, protonix, NS  Wt: stable, admit wt 134#, current- 137#  PO: 42% avg meal intake x last 6 documented meals  I/O's:  -923 ml (net since admission)   Diet Order:   Diet Order             DIET SOFT Room service appropriate? Yes; Fluid consistency: Thin  Diet effective now                   EDUCATION NEEDS:   Education needs have  been addressed  Skin:  Skin Assessment: Reviewed RN Assessment  Last BM:  7/7 type 6  Height:   Ht Readings from Last 1 Encounters:  04/13/23 5\' 5"  (1.651 m)    Weight:   Wt Readings from Last 1 Encounters:  04/24/23 62.3 kg    Ideal Body Weight:     BMI:  Body mass index is 22.86 kg/m.  Estimated Nutritional Needs:   Kcal:  1600-1800  Protein:  85-100g  Fluid:  >/=1.6L    Leodis Rains, RDN, LDN  Clinical Nutrition

## 2023-04-24 NOTE — Progress Notes (Signed)
Physical Therapy Treatment Patient Details Name: Kayla Dean MRN: [id number] DOB: 22-Mar-1951 Today's Date: 04/24/2023   History of Present Illness 72 yo female admitted 6/27 from MD office with hypotension with pressor requirement. Pt with PE and colitis. PMHx; exlap with incarcerated hernia reduction 02/02/23, gastric bypass, Rt THA, Rt TKA, Rt RCR    PT Comments  Pt received standing in room and agreeable to session. Pt reporting R hip pain this session and required increased assist returning to bed at end of session to elevate BLE. Pt able to tolerate increased gait distance this session with no evidence of instability with RW support. HR remained in the 130's throughout ambulation with HR max of 141 at end of trial. Pt reporting no concerns about navigating home set up at discharge. Pt continues to benefit from PT services to progress toward functional mobility goals.     Assistance Recommended at Discharge Intermittent Supervision/Assistance  If plan is discharge home, recommend the following:  Can travel by private vehicle    A little help with walking and/or transfers;A little help with bathing/dressing/bathroom;Assistance with cooking/housework;Assist for transportation      Equipment Recommendations       Recommendations for Other Services       Precautions / Restrictions Precautions Precautions: Fall;Other (comment) Precaution Comments: watch HR and BP Restrictions Weight Bearing Restrictions: No     Mobility  Bed Mobility Overal bed mobility: Needs Assistance Bed Mobility: Sit to Supine       Sit to supine: Min assist   General bed mobility comments: light min A to elevate BLE to EOB    Transfers                   General transfer comment: Pt standing in room upon entry    Ambulation/Gait Ambulation/Gait assistance: Supervision Gait Distance (Feet): 220 Feet Assistive device: Rolling walker (2 wheels) Gait Pattern/deviations: Step-through  pattern, Trunk flexed       General Gait Details: Slow, steady pace with cues for upright posture.       Balance Overall balance assessment: Needs assistance Sitting-balance support: Feet supported, No upper extremity supported Sitting balance-Leahy Scale: Good Sitting balance - Comments: sitting EOB   Standing balance support: Bilateral upper extremity supported, During functional activity Standing balance-Leahy Scale: Fair Standing balance comment: with RW support                            Cognition Arousal/Alertness: Awake/alert Behavior During Therapy: WFL for tasks assessed/performed Overall Cognitive Status: Within Functional Limits for tasks assessed                                          Exercises      General Comments General comments (skin integrity, edema, etc.): HR in 130s throughout ambulation and HR max 141 at end of gait trial. HR in 70's in supine at end of session.      Pertinent Vitals/Pain Pain Assessment Pain Assessment: Faces Faces Pain Scale: Hurts little more Pain Location: R hip Pain Descriptors / Indicators: Sharp Pain Intervention(s): Monitored during session, Repositioned     PT Goals (current goals can now be found in the care plan section) Acute Rehab PT Goals Patient Stated Goal: return home PT Goal Formulation: With patient/family Time For Goal Achievement: 05/03/23 Potential to Achieve Goals: Good Progress towards  PT goals: Progressing toward goals    Frequency    Min 3X/week      PT Plan Current plan remains appropriate       AM-PAC PT "6 Clicks" Mobility   Outcome Measure  Help needed turning from your back to your side while in a flat bed without using bedrails?: A Little Help needed moving from lying on your back to sitting on the side of a flat bed without using bedrails?: A Little Help needed moving to and from a bed to a chair (including a wheelchair)?: A Little Help needed  standing up from a chair using your arms (e.g., wheelchair or bedside chair)?: A Little Help needed to walk in hospital room?: A Little Help needed climbing 3-5 steps with a railing? : A Little 6 Click Score: 18    End of Session   Activity Tolerance: Patient tolerated treatment well Patient left: in bed;with call bell/phone within reach;with family/visitor present Nurse Communication: Mobility status PT Visit Diagnosis: Other abnormalities of gait and mobility (R26.89)     Time: 1610-9604 PT Time Calculation (min) (ACUTE ONLY): 12 min  Charges:    $Gait Training: 8-22 mins PT General Charges $$ ACUTE PT VISIT: 1 Visit                     Johny Shock, PTA Acute Rehabilitation Services Secure Chat Preferred  [phone]    Johny Shock 04/24/2023, 2:18 PM

## 2023-04-25 ENCOUNTER — Ambulatory Visit (HOSPITAL_COMMUNITY): Payer: Medicare Other

## 2023-04-25 DIAGNOSIS — E039 Hypothyroidism, unspecified: Secondary | ICD-10-CM | POA: Diagnosis not present

## 2023-04-25 DIAGNOSIS — E861 Hypovolemia: Secondary | ICD-10-CM | POA: Diagnosis not present

## 2023-04-25 DIAGNOSIS — E440 Moderate protein-calorie malnutrition: Secondary | ICD-10-CM | POA: Diagnosis not present

## 2023-04-25 DIAGNOSIS — I82451 Acute embolism and thrombosis of right peroneal vein: Secondary | ICD-10-CM | POA: Diagnosis not present

## 2023-04-25 MED ORDER — HYDROCORTISONE 20 MG PO TABS
20.0000 mg | ORAL_TABLET | Freq: Every day | ORAL | Status: DC
  Administered 2023-04-25 – 2023-04-26 (×2): 20 mg via ORAL
  Filled 2023-04-25 (×2): qty 1

## 2023-04-25 NOTE — Progress Notes (Signed)
Mobility Specialist Progress Note:   04/25/23 1624  Mobility  Activity Ambulated with assistance in hallway  Level of Assistance Contact guard assist, steadying assist  Assistive Device Front wheel walker  Distance Ambulated (ft) 450 ft  Activity Response Tolerated well  Mobility Referral Yes  $Mobility charge 1 Mobility  Mobility Specialist Start Time (ACUTE ONLY) 1420  Mobility Specialist Stop Time (ACUTE ONLY) 1430  Mobility Specialist Time Calculation (min) (ACUTE ONLY) 10 min    During Mobility: 121 HR  Post Mobility: 79 HR , 93/71 BP  Pt received in chair, agreeable to mobility. Denied any feelings of pain or discomfort during ambulation. Asymptomatic throughout. Pt returned to chair with VSS and call bell in hand.   Leory Plowman  Mobility Specialist Please contact via [employer] office at [phone]

## 2023-04-25 NOTE — TOC Progression Note (Addendum)
Transition of Care (TOC) - Progression Note  Donn Pierini RN, BSN Transitions of Care Unit 4E- RN Case Manager See Treatment Team for direct phone #   Patient Details  Name: Kayla Dean MRN: [id number] Date of Birth: [birth year]  Transition of Care [facility]) CM/SW Contact  Kayla Dean, Kayla Sink, RN Phone Number: 04/25/2023, 11:17 AM  Clinical Narrative:    Cm spoke with pt at bedside to follow up on [facility] choice (daughter-Kayla Dean and grand-daughter also present) Per pt she has selected [facility] as her first choice [facility] [place] and [employer] as backup)  Call made to [facility] liaison- Kayla Dean- referral given for HHPTMorrie Dean to check with branch to see if they can service pt's location.  1100- have received word that [facility]- can service pt for [facility] needs- however pt's insurance is out of network- they will not be able to service- referral declined.  1130- reached out to [facility]- per Kayla Dean they are in-network and can service in pt's area- referral has been accepted.   Need HHPT order placed prior to discharge   Expected Discharge Plan: Home w Home Health Services Barriers to Discharge: Barriers Resolved  Expected Discharge Plan and Services   Discharge Planning Services: CM Consult Post Acute Care Choice: Home Health Living arrangements for the past 2 months: Single Family Home                 DME Arranged: N/A DME Agency: NA       HH Arranged: PT HH Agency: [facility] Date HH Agency Contacted: 04/25/23 Time HH Agency Contacted: 1025 Representative spoke with at [facility] Agency: Kayla Dean   Social Determinants of Health (SDOH) Interventions SDOH Screenings   Food Insecurity: No Food Insecurity (04/13/2023)  Housing: Low Risk  (04/13/2023)  Transportation Needs: No Transportation Needs (04/13/2023)  Utilities: Not At Risk (04/13/2023)  Tobacco Use: Low Risk  (04/12/2023)    Readmission Risk Interventions     No data to display

## 2023-04-25 NOTE — Progress Notes (Signed)
PROGRESS NOTE    Kayla Dean  ZOX:[id number] DOB: November 21, 1950 DOA: 04/12/2023 PCP: [facility]    Brief Narrative:  72 year old with history of hypothyroidism, recent emergency gastric bypass due to incarcerated hernia who was following up with her regular provider and found to have hypotension and sent to emergency department.  She does have a history of gastric bypass secondary to small bowel obstruction and incarceration of hernia.  She was admitted to ICU initially resuscitated with fluid and then subsequently with vasopressors.  She also received antibiotics.  Started on midodrine and hydrocortisone, blood pressure stabilized and transferred out of the ICU on 7/9.   Assessment & Plan:   Hypotension: Multifactorial.  Suspected volume depletion, ischemic colitis. Sepsis, ruled out.  Antibiotics completed. Patient was treated with high-dose hydrocortisone,  Cortisol levels were normal.  Since blood pressures are adequate, will de-escalate hydrocortisone to 20 mg by mouth daily.  On midodrine 15 mg 3 times daily. Wear compression stockings and mobilize around.  Acute pulmonary embolism, acute right peroneal vein DVT: Probably aggravated with recent surgery. Started on Eliquis, tolerating.  Will continue Eliquis maintenance dose on discharge.  Hypothyroidism: On Synthroid.  Continued.  Abdominal pain, chronic mesenteric ischemia: Pain resolved.  Currently stabilized.   DVT prophylaxis: Place TED hose Start: 04/14/23 1931 SCDs Start: 04/13/23 0447 apixaban (ELIQUIS) tablet 5 mg   Code Status: Full code Family Communication: None today. Disposition Plan: Status is: Inpatient Remains inpatient appropriate because: Significant low blood pressures, on vasoactive medications     Consultants:  Critical care  Procedures:  None  Antimicrobials:  Completed antibiotics   Subjective:  Patient seen and examined.  No overnight events.  Her blood  pressures are lower side but adequate and without symptoms.  She was able to get up and walk around.  Did not wear compression socks.  Objective: Vitals:   04/24/23 2309 04/25/23 0232 04/25/23 0757 04/25/23 1216  BP: 90/67 93/61 (!) 86/58 (!) 89/69  Pulse: 67 73 76 100  Resp: 18 15 15 20   Temp: 98.1 F (36.7 C) 97.7 F (36.5 C) 97.7 F (36.5 C) 97.9 F (36.6 C)  TempSrc: Oral Oral Oral Oral  SpO2: 99% 97%  96%  Weight:      Height:       No intake or output data in the 24 hours ending 04/25/23 1316  Filed Weights   04/22/23 0500 04/23/23 0500 04/24/23 0432  Weight: 61.3 kg 62.1 kg 62.3 kg    Examination:  General exam: Appears calm and comfortable.  On room air Oriented x 4.  Interactive. Respiratory system: No added sounds. Cardiovascular system: S1 & S2 heard, RRR. No JVD, murmurs, rubs, gallops or clicks.  Trace bilateral pedal edema up to the both knees. Gastrointestinal system: Soft.  Nontender.  Bowel sound present. Central nervous system: Alert and oriented. No focal neurological deficits. Extremities: Symmetric 5 x 5 power.     Data Reviewed: I have personally reviewed following labs and imaging studies  CBC: Recent Labs  Lab 04/21/23 0406 04/21/23 0921 04/22/23 0415 04/23/23 0520 04/24/23 0545  WBC 3.0* 5.5 5.6 5.9 10.8*  HGB 9.5* 10.0* 9.3* 9.5* 9.3*  HCT 29.7* 31.4* 29.2* 30.1* 29.4*  MCV 94.9 97.8 96.4 98.0 98.3  PLT 172 220 186 190 202    Basic Metabolic Panel: Recent Labs  Lab 04/19/23 0500 04/20/23 0106 04/20/23 1432 04/21/23 0406 04/22/23 0415 04/23/23 0520 04/24/23 0545  NA 134* 135  --  135  139 138 136  K 4.0 4.3  --  3.8 3.6 3.8 4.0  CL 101 103  --  102 107 106 104  CO2 27 24  --  28 27 26 26   GLUCOSE 171* 99  --  103* 101* 122* 79  BUN 8 14  --  10 7* 10 10  CREATININE 0.71 0.67  --  0.54 0.64 0.57 0.66  CALCIUM 7.3* 7.2*  --  7.1* 7.2* 7.3* 7.2*  MG 1.9 1.8 1.7  --   --   --   --     GFR: Estimated Creatinine Clearance:  57.2 mL/min (by C-G formula based on SCr of 0.66 mg/dL). Liver Function Tests: Recent Labs  Lab 04/19/23 0500 04/20/23 0106 04/21/23 0406  AST 14* 15 12*  ALT 17 18 15   ALKPHOS 85 76 63  BILITOT 0.3 0.4 0.5  PROT 3.9* 3.9* 3.5*  ALBUMIN <1.5* <1.5* <1.5*    No results for input(s): "LIPASE", "AMYLASE" in the last 168 hours. No results for input(s): "AMMONIA" in the last 168 hours. Coagulation Profile: No results for input(s): "INR", "PROTIME" in the last 168 hours. Cardiac Enzymes: No results for input(s): "CKTOTAL", "CKMB", "CKMBINDEX", "TROPONINI" in the last 168 hours. BNP (last 3 results) No results for input(s): "PROBNP" in the last 8760 hours. HbA1C: No results for input(s): "HGBA1C" in the last 72 hours. CBG: Recent Labs  Lab 04/21/23 1952 04/23/23 1122  GLUCAP 128* 143*    Lipid Profile: No results for input(s): "CHOL", "HDL", "LDLCALC", "TRIG", "CHOLHDL", "LDLDIRECT" in the last 72 hours. Thyroid Function Tests: No results for input(s): "TSH", "T4TOTAL", "FREET4", "T3FREE", "THYROIDAB" in the last 72 hours. Anemia Panel: No results for input(s): "VITAMINB12", "FOLATE", "FERRITIN", "TIBC", "IRON", "RETICCTPCT" in the last 72 hours. Sepsis Labs: No results for input(s): "PROCALCITON", "LATICACIDVEN" in the last 168 hours.  Recent Results (from the past 240 hour(s))  Culture, blood (Routine X 2) w Reflex to ID Panel     Status: None   Collection Time: 04/15/23  4:30 PM   Specimen: BLOOD  Result Value Ref Range Status   Specimen Description BLOOD A-LINE RIGHT  Final   Special Requests   Final    BOTTLES DRAWN AEROBIC AND ANAEROBIC Blood Culture adequate volume   Culture   Final    NO GROWTH 5 DAYS Performed at [facility], [address] [place], [place] [address]    Report Status 04/20/2023 FINAL  Final  Culture, blood (Routine X 2) w Reflex to ID Panel     Status: None   Collection Time: 04/15/23  4:49 PM   Specimen: BLOOD RIGHT HAND  Result  Value Ref Range Status   Specimen Description BLOOD RIGHT HAND  Final   Special Requests   Final    BOTTLES DRAWN AEROBIC AND ANAEROBIC Blood Culture adequate volume   Culture   Final    NO GROWTH 5 DAYS Performed at [facility], [address], [place], [place] [address]    Report Status 04/20/2023 FINAL  Final         Radiology Studies: No results found.      Scheduled Meds:  apixaban  5 mg Oral BID   calcium carbonate  200 mg of elemental calcium Oral TID   Chlorhexidine Gluconate Cloth  6 each Topical Daily   feeding supplement  1 Container Oral TID BM   hydrocortisone  20 mg Oral Daily   levothyroxine  137 mcg Oral Q0600  midodrine  15 mg Oral Q8H   multivitamin with minerals  1 tablet Oral BID   pantoprazole  40 mg Oral Q1200   sodium chloride flush  10-40 mL Intracatheter Q12H   Continuous Infusions:  sodium chloride Stopped (04/14/23 0944)     LOS: 13 days    Time spent: 35 minutes    Dorcas Carrow, MD [facility]

## 2023-04-25 NOTE — Progress Notes (Signed)
Occupational Therapy Treatment Patient Details Name: Kayla Dean MRN: [id number] DOB: Jan 25, 1951 Today's Date: 04/25/2023   History of present illness 72 yo female admitted 6/27 from MD office with hypotension with pressor requirement. Pt with PE and colitis. PMHx; exlap with incarcerated hernia reduction 02/02/23, gastric bypass, Rt THA, Rt TKA, Rt RCR   OT comments  Pt progressing towards goals this session, needing supervision for ADLs, mod I for bed mobility and supervision for transfers with RW. Continued education on energy conservation strategies and pt verbalized understanding. Pt presenting with impairments listed below, will follow acutely. Continue to anticipate no OT follow up needs at d/c.    Recommendations for follow up therapy are one component of a multi-disciplinary discharge planning process, led by the attending physician.  Recommendations may be updated based on patient status, additional functional criteria and insurance authorization.    Assistance Recommended at Discharge Set up Supervision/Assistance  Patient can return home with the following  Assist for transportation;Assistance with cooking/housework   Equipment Recommendations  None recommended by OT    Recommendations for Other Services      Precautions / Restrictions Precautions Precautions: Fall;Other (comment) Precaution Comments: watch HR and BP Restrictions Weight Bearing Restrictions: No       Mobility Bed Mobility Overal bed mobility: Modified Independent                  Transfers Overall transfer level: Needs assistance Equipment used: Rolling walker (2 wheels) Transfers: Sit to/from Stand Sit to Stand: Supervision                 Balance Overall balance assessment: Needs assistance Sitting-balance support: Feet supported, No upper extremity supported Sitting balance-Leahy Scale: Good Sitting balance - Comments: sitting EOB   Standing balance support: Bilateral  upper extremity supported, During functional activity Standing balance-Leahy Scale: Fair Standing balance comment: with RW support                           ADL either performed or assessed with clinical judgement   ADL Overall ADL's : Needs assistance/impaired     Grooming: Wash/dry hands;Supervision/safety;Standing                   Toilet Transfer: Supervision/safety;Ambulation;Regular Toilet;Rolling walker (2 wheels) Toilet Transfer Details (indicate cue type and reason): with BSC over toilet Toileting- Clothing Manipulation and Hygiene: Independent       Functional mobility during ADLs: Supervision/safety;Rolling walker (2 wheels)      Extremity/Trunk Assessment Upper Extremity Assessment Upper Extremity Assessment: Overall WFL for tasks assessed   Lower Extremity Assessment Lower Extremity Assessment: Defer to PT evaluation        Vision   Vision Assessment?: No apparent visual deficits   Perception Perception Perception: Not tested   Praxis Praxis Praxis: Not tested    Cognition Arousal/Alertness: Awake/alert Behavior During Therapy: WFL for tasks assessed/performed Overall Cognitive Status: Within Functional Limits for tasks assessed                                 General Comments: Pleasant and cooperative        Exercises      Shoulder Instructions       General Comments HR 125-130 throughout session    Pertinent Vitals/ Pain       Pain Assessment Pain Assessment: No/denies pain  Home Living  Prior Functioning/Environment              Frequency  Min 2X/week        Progress Toward Goals  OT Goals(current goals can now be found in the care plan section)  Progress towards OT goals: Progressing toward goals  Acute Rehab OT Goals Patient Stated Goal: to go home OT Goal Formulation: With patient Time For Goal Achievement:  05/03/23 Potential to Achieve Goals: Good ADL Goals Pt Will Perform Grooming: with modified independence;standing Pt Will Perform Lower Body Dressing: with modified independence;sit to/from stand Pt Will Transfer to Toilet: with modified independence;ambulating;regular height toilet  Plan Discharge plan remains appropriate;Frequency remains appropriate    Co-evaluation                 AM-PAC OT "6 Clicks" Daily Activity     Outcome Measure   Help from another person eating meals?: None Help from another person taking care of personal grooming?: None Help from another person toileting, which includes using toliet, bedpan, or urinal?: A Little Help from another person bathing (including washing, rinsing, drying)?: A Little Help from another person to put on and taking off regular upper body clothing?: A Little Help from another person to put on and taking off regular lower body clothing?: A Little 6 Click Score: 20    End of Session Equipment Utilized During Treatment: Gait belt;Rolling walker (2 wheels)  OT Visit Diagnosis: Unsteadiness on feet (R26.81)   Activity Tolerance Patient tolerated treatment well   Patient Left in chair;with call bell/phone within reach   Nurse Communication Mobility status        Time: 2130-8657 OT Time Calculation (min): 17 min  Charges: OT General Charges $OT Visit: 1 Visit OT Treatments $Self Care/Home Management : 8-22 mins  Carver Fila, OTD, OTR/L SecureChat Preferred Acute Rehab [phone]   Brayon Bielefeld K Koonce 04/25/2023, 2:15 PM

## 2023-04-26 DIAGNOSIS — I82451 Acute embolism and thrombosis of right peroneal vein: Secondary | ICD-10-CM | POA: Diagnosis not present

## 2023-04-26 DIAGNOSIS — E861 Hypovolemia: Secondary | ICD-10-CM | POA: Diagnosis not present

## 2023-04-26 MED ORDER — HYDROCORTISONE 5 MG PO TABS: ORAL_TABLET | ORAL | 0 refills | Status: DC

## 2023-04-26 MED ORDER — APIXABAN 5 MG PO TABS: 5.0000 mg | ORAL_TABLET | Freq: Two times a day (BID) | ORAL | 2 refills | Status: DC

## 2023-04-26 MED ORDER — MIDODRINE HCL 5 MG PO TABS: 15.0000 mg | ORAL_TABLET | Freq: Three times a day (TID) | ORAL | 0 refills | Status: DC

## 2023-04-26 NOTE — Discharge Summary (Signed)
Physician Discharge Summary  Kayla Dean:[id number] DOB: 08-Jul-1951 DOA: 04/12/2023  PCP: [facility]  Admit date: 04/12/2023 Discharge date: 04/26/2023  Admitted From: Home Disposition: Home with home health  Recommendations for Outpatient Follow-up:  Follow up with PCP in 1-2 weeks Please obtain BMP/CBC in one week   Home Health: PT/OT Equipment/Devices: None  Discharge Condition: Stable CODE STATUS: Full code Diet recommendation: Regular diet, nutritional supplements  Discharge summary: 72 year old with history of hypothyroidism, recent emergency gastric bypass due to incarcerated hernia who was following up with her regular provider and found to have hypotension and sent to emergency department.  She does have a history of gastric bypass secondary to small bowel obstruction and incarceration of hernia.  She was admitted to ICU initially resuscitated with fluid and then subsequently with vasopressors.  She also received antibiotics.  Started on midodrine and hydrocortisone, blood pressure stabilized and transferred out of the ICU on 7/9.  Remains fairly stable since transferring out of the ICU.  Going home today.  Treated for following conditions.    # Hypotension: Multifactorial.  Suspected volume depletion, ischemic colitis. Sepsis, ruled out.  Antibiotics completed. Patient was treated with high-dose hydrocortisone,  Cortisol levels were normal.  Since blood pressures are adequate Currently on hydrocortisone 20 mg daily, will de-escalate and ultimately stop in the next 4 days. Will discharge patient on midodrine 15 mg 3 times daily. Wear compression stockings and mobilize around. Gradually improve mobility and work with therapist at home.   # Acute pulmonary embolism, acute right peroneal vein DVT: Probably aggravated with recent surgery. Started on Eliquis, tolerating.  Will continue Eliquis maintenance dose on discharge.   # Hypothyroidism:  On Synthroid.  Continued.   # Abdominal pain, chronic mesenteric ischemia: Pain resolved.  Currently stabilized.  Adequate bowel functions.  Patient is medically stable today to discharge home.  She will benefit with working with therapist at home.  Discharge Diagnoses:  Principal Problem:   Acute pulmonary embolism (HCC) Active Problems:   Hypotension   Acquired hypothyroidism   Peripheral edema   Elevated brain natriuretic peptide (BNP) level   Hypoalbuminemia due to protein-calorie malnutrition (HCC)   Malnutrition of moderate degree   Acute deep vein thrombosis (DVT) of right peroneal vein [facility])    Discharge Instructions  Discharge Instructions     Diet general   Complete by: As directed    Discharge instructions   Complete by: As directed    Wear compression stockings as much possible   Increase activity slowly   Complete by: As directed       Allergies as of 04/26/2023   No Known Allergies      Medication List     STOP taking these medications    furosemide 80 MG tablet Commonly known as: LASIX   potassium chloride SA 20 MEQ tablet Commonly known as: KLOR-CON M       TAKE these medications    acetaminophen 500 MG tablet Commonly known as: TYLENOL Take 1,000 mg by mouth every 8 (eight) hours as needed for moderate pain.   apixaban 5 MG Tabs tablet Commonly known as: ELIQUIS Take 1 tablet (5 mg total) by mouth 2 (two) times daily.   Calcium 600 + D 600-200 MG-UNIT Tabs Generic drug: Calcium Carb-Cholecalciferol Take 1 tablet by mouth every morning.   ferrous sulfate 325 (65 FE) MG tablet Take 325 mg by mouth every morning.   hydrocortisone 5 MG tablet Commonly known as: CORTEF  4 tablets daily for 1 day 3 tablets daily for 1 day 2 tablets daily for 1 day 1 tablet daily for 1 day Start taking on: April 27, 2023   levothyroxine 137 MCG tablet Commonly known as: SYNTHROID Take 137 mcg by mouth daily before breakfast.   midodrine 5 MG  tablet Commonly known as: PROAMATINE Take 3 tablets (15 mg total) by mouth every 8 (eight) hours.   multivitamin with minerals Tabs tablet Take 1 tablet by mouth every morning.   ondansetron 4 MG disintegrating tablet Commonly known as: ZOFRAN-ODT Take 1 tablet (4 mg total) by mouth every 6 (six) hours as needed for nausea.   oxyCODONE 5 MG immediate release tablet Commonly known as: Oxy IR/ROXICODONE Take 1 tablet (5 mg total) by mouth every 4 (four) hours as needed for severe pain or breakthrough pain.   VITAMIN D-3 PO Take 1 capsule by mouth daily.   zolpidem 5 MG tablet Commonly known as: Ambien Take 1 tablet (5 mg total) by mouth at bedtime as needed for sleep.        Follow-up Information     [facility] Follow up.   Specialty: Home Health Services Why: HHPT/OT arranged- they will contact  you post discharge to schedule Contact information: [address] [place] [address] [phone]                No Known Allergies  Consultations: Critical care   Procedures/Studies: DG Abd Portable 1V  Result Date: 04/22/2023 CLINICAL DATA:  Abdominal pain. EXAM: PORTABLE ABDOMEN - 1 VIEW COMPARISON:  Abdomen pelvis CT 05/11/2023 FINDINGS: Convex leftward lumbar scoliosis noted with diffuse bony demineralization. Patient is status post right hip replacement. There is no evidence for gaseous bowel dilation to suggest obstruction. Anastomotic staple line noted right lower abdomen and left upper quadrant. IMPRESSION: Nonobstructive bowel gas pattern. Electronically Signed   By: Kennith Center M.D.   On: 04/22/2023 13:56   VAS [place] LOWER EXTREMITY VENOUS (DVT)  Result Date: 04/21/2023  Lower Venous DVT Study Patient Name:  Kayla Dean  Date of Exam:   04/20/2023 Medical Rec #: [id number]        Accession #:    [id number] Date of Birth: [birth year]        Patient Gender: F Patient Age:   72 years Exam Location:  [facility] Procedure:      VAS  [place] LOWER EXTREMITY VENOUS (DVT) Referring Phys: Laurier Nancy SOOD --------------------------------------------------------------------------------  Indications: Swelling.  Risk Factors: Confirmed PE. Anticoagulation: Eliquis. Comparison Study: No prior studies. Performing Technologist: Chanda Busing RVT  Examination Guidelines: A complete evaluation includes B-mode imaging, spectral Doppler, color Doppler, and power Doppler as needed of all accessible portions of each vessel. Bilateral testing is considered an integral part of a complete examination. Limited examinations for reoccurring indications may be performed as noted. The reflux portion of the exam is performed with the patient in reverse Trendelenburg.  +---------+---------------+---------+-----------+----------+--------------+ RIGHT    CompressibilityPhasicitySpontaneityPropertiesThrombus Aging +---------+---------------+---------+-----------+----------+--------------+ CFV      Full           Yes      Yes                                 +---------+---------------+---------+-----------+----------+--------------+ SFJ      Full                                                        +---------+---------------+---------+-----------+----------+--------------+  FV Prox  Full                                                        +---------+---------------+---------+-----------+----------+--------------+ FV Mid   Full                                                        +---------+---------------+---------+-----------+----------+--------------+ FV DistalFull                                                        +---------+---------------+---------+-----------+----------+--------------+ PFV      Full                                                        +---------+---------------+---------+-----------+----------+--------------+ POP      Full           Yes      Yes                                  +---------+---------------+---------+-----------+----------+--------------+ PTV      Full                                                        +---------+---------------+---------+-----------+----------+--------------+ PERO     Partial                                      Acute          +---------+---------------+---------+-----------+----------+--------------+   +---------+---------------+---------+-----------+----------+--------------+ LEFT     CompressibilityPhasicitySpontaneityPropertiesThrombus Aging +---------+---------------+---------+-----------+----------+--------------+ CFV      Full           Yes      Yes                                 +---------+---------------+---------+-----------+----------+--------------+ SFJ      Full                                                        +---------+---------------+---------+-----------+----------+--------------+ FV Prox  Full                                                        +---------+---------------+---------+-----------+----------+--------------+  FV Mid   Full                                                        +---------+---------------+---------+-----------+----------+--------------+ FV DistalFull                                                        +---------+---------------+---------+-----------+----------+--------------+ PFV      Full                                                        +---------+---------------+---------+-----------+----------+--------------+ POP      Full           Yes      Yes                                 +---------+---------------+---------+-----------+----------+--------------+ PTV      Full                                                        +---------+---------------+---------+-----------+----------+--------------+ PERO     Full                                                         +---------+---------------+---------+-----------+----------+--------------+     Summary: RIGHT: - Findings consistent with acute deep vein thrombosis involving the right peroneal veins. - No cystic structure found in the popliteal fossa.  LEFT: - There is no evidence of deep vein thrombosis in the lower extremity.  - No cystic structure found in the popliteal fossa.  *See table(s) above for measurements and observations. Electronically signed by Lemar Livings MD on 04/21/2023 at 10:30:45 AM.    Final    CT ABDOMEN PELVIS W CONTRAST  Addendum Date: 04/18/2023   ADDENDUM REPORT: 04/18/2023 12:01 ADDENDUM: Corrected report: Original report was generated with an error due to voice recognition software, corrected as follows: No suspicious focal liver lesion. Electronically Signed   By: Allegra Lai M.D.   On: 04/18/2023 12:01   Result Date: 04/18/2023 CLINICAL DATA:  Postop reduction of incarcerated hernia 02/02/2023; chronic mesenteric ischemia EXAM: CT ABDOMEN AND PELVIS WITH CONTRAST TECHNIQUE: Multidetector CT imaging of the abdomen and pelvis was performed using the standard protocol following bolus administration of intravenous contrast. RADIATION DOSE REDUCTION: This exam was performed according to the departmental dose-optimization program which includes automated exposure control, adjustment of the mA and/or kV according to patient size and/or use of iterative reconstruction technique. CONTRAST:  85mL OMNIPAQUE IOHEXOL 300 MG/ML  SOLN COMPARISON:  CT abdomen and pelvis dated May 02/03/2023 FINDINGS: Lower chest: Pulmonary embolus of the  right interlobar pulmonary artery and proximal segmental and subsegmental pulmonary arteries. Moderate hiatal hernia. Trace left-greater-than-right pleural effusions and bibasilar atelectasis. Hepatobiliary: Hepatic steatosis. Suspicious focal liver lesion. Prior cholecystectomy. Unchanged biliary ductal dilation. Pancreas: Unremarkable. No pancreatic ductal dilatation or  surrounding inflammatory changes. Spleen: Normal in size without focal abnormality. Adrenals/Urinary Tract: Bilateral adrenal glands are unremarkable. No hydronephrosis or nephrolithiasis. Bladder is unremarkable. Stomach/Bowel: Prior gastric jejunostomy. Small hiatal hernia containing the gastric pouch. Multiple thick-walled loops small bowel between two jejunal-jejunal anastomotic suture lines. Associated mesenteric edema and prominent mesenteric lymph nodes, similar to prior exam. No evidence of obstruction. Vascular/Lymphatic: Aortic atherosclerosis. No enlarged abdominal or pelvic lymph nodes. Reproductive: Adnexal mass. Evaluation of the pelvis somewhat limited due to streak artifact from right hip arthroplasty. Other: Postsurgical changes of the anterior abdominal wall. Small locule of soft tissue gas involving the right lower quadrant anterior abdominal wall, likely injection related. Musculoskeletal: Total right hip arthroplasty. Mild levocurvature of the lumbar spine. No aggressive appearing osseous lesions. IMPRESSION: 1. Pulmonary embolus of the right interlobar pulmonary artery and proximal segmental and subsegmental pulmonary arteries. 2. Multiple thick-walled loops of small bowel between two anastomotic suture lines with associated mesenteric edema and prominent mesenteric lymph nodes, findings are similar to prior exam and consistent with history of chronic mesenteric ischemia. 3. Trace left-greater-than-right pleural effusions and bibasilar atelectasis. 4. Aortic Atherosclerosis (ICD10-I70.0). Critical Value/emergent results (pulmonary embolus) were called by telephone at the time of interpretation on 04/13/2023 at 3:08 pm to provider Dr. Mariea Clonts, who verbally acknowledged these results. Electronically Signed: By: Allegra Lai M.D. On: 04/13/2023 15:11   US Abdomen Limited RUQ (LIVER/GB)  Result Date: 04/18/2023 CLINICAL DATA:  Liver lesion EXAM: ULTRASOUND ABDOMEN LIMITED RIGHT UPPER QUADRANT  COMPARISON:  CT abdomen pelvis with contrast 04/13/2023 FINDINGS: Gallbladder: Surgically absent Common bile duct: Diameter: 4 mm Liver: Parenchymal echogenicity: Diffusely echogenic Contours: Normal Lesions: None Portal vein: Patent.  Hepatopetal flow Other: None. IMPRESSION: 1. Diffuse increased echogenicity of the hepatic parenchyma is a nonspecific indicator of hepatocellular dysfunction, most commonly steatosis. 2. No liver lesion identified. Electronically Signed   By: Acquanetta Belling M.D.   On: 04/18/2023 10:58   [place] EKG SITE RITE  Result Date: 04/15/2023 If Site Rite image not attached, placement could not be confirmed due to current cardiac rhythm.  ECHOCARDIOGRAM COMPLETE  Result Date: 04/13/2023    ECHOCARDIOGRAM REPORT   Patient Name:   ROCQUEL ASKREN Date of Exam: 04/13/2023 Medical Rec #:  [id number]       Height:       65.0 in Accession #:    [id number]      Weight:       134.0 lb Date of Birth:  April 05, 1951       BSA:          1.669 m Patient Age:    71 years        BP:           73/56 mmHg Patient Gender: F               HR:           71 bpm. [facility] Procedure: 2D Echo, Cardiac Doppler and Color Doppler Indications:    CHF-Acute Diastolic I50.31  History:        Patient has prior history of Echocardiogram examinations, most                 recent 08/13/2018. Risk Factors:Family History  of Coronary                 Artery Disease and Hypotension.  Sonographer:    Celesta Gentile RCS Referring Phys: [id number] OLADAPO ADEFESO IMPRESSIONS  1. Left ventricular ejection fraction, by estimation, is 60 to 65%. The left ventricle has normal function. The left ventricle has no regional wall motion abnormalities. Left ventricular diastolic parameters were normal.  2. Right ventricular systolic function is normal. The right ventricular size is normal.  3. Left atrial size was mildly dilated.  4. The mitral valve is abnormal. Trivial mitral valve regurgitation. No evidence of mitral stenosis.  5.  The aortic valve is tricuspid. There is moderate calcification of the aortic valve. There is moderate thickening of the aortic valve. Aortic valve regurgitation is mild. Aortic valve sclerosis is present, with no evidence of aortic valve stenosis.  6. The inferior vena cava is normal in size with greater than 50% respiratory variability, suggesting right atrial pressure of 3 mmHg. FINDINGS  Left Ventricle: Left ventricular ejection fraction, by estimation, is 60 to 65%. The left ventricle has normal function. The left ventricle has no regional wall motion abnormalities. The left ventricular internal cavity size was normal in size. There is  no left ventricular hypertrophy. Left ventricular diastolic parameters were normal. Right Ventricle: The right ventricular size is normal. No increase in right ventricular wall thickness. Right ventricular systolic function is normal. Left Atrium: Left atrial size was mildly dilated. Right Atrium: Right atrial size was normal in size. Pericardium: There is no evidence of pericardial effusion. Mitral Valve: The mitral valve is abnormal. There is mild thickening of the mitral valve leaflet(s). Mild mitral annular calcification. Trivial mitral valve regurgitation. No evidence of mitral valve stenosis. Tricuspid Valve: The tricuspid valve is normal in structure. Tricuspid valve regurgitation is mild . No evidence of tricuspid stenosis. Aortic Valve: The aortic valve is tricuspid. There is moderate calcification of the aortic valve. There is moderate thickening of the aortic valve. Aortic valve regurgitation is mild. Aortic regurgitation PHT measures 778 msec. Aortic valve sclerosis is present, with no evidence of aortic valve stenosis. Aortic valve mean gradient measures 6.0 mmHg. Aortic valve peak gradient measures 14.7 mmHg. Aortic valve area, by VTI measures 1.92 cm. Pulmonic Valve: The pulmonic valve was normal in structure. Pulmonic valve regurgitation is not visualized. No  evidence of pulmonic stenosis. Aorta: The aortic root is normal in size and structure. Venous: The inferior vena cava is normal in size with greater than 50% respiratory variability, suggesting right atrial pressure of 3 mmHg. IAS/Shunts: No atrial level shunt detected by color flow Doppler.  LEFT VENTRICLE PLAX 2D LVIDd:         4.20 cm   Diastology LVIDs:         2.30 cm   LV e' medial:    6.85 cm/s LV PW:         1.00 cm   LV E/e' medial:  11.5 LV IVS:        0.90 cm   LV e' lateral:   9.57 cm/s LVOT diam:     2.00 cm   LV E/e' lateral: 8.2 LV SV:         73 LV SV Index:   43 LVOT Area:     3.14 cm  RIGHT VENTRICLE RV S prime:     17.40 cm/s TAPSE (M-mode): 2.1 cm LEFT ATRIUM             Index  RIGHT ATRIUM           Index LA diam:        3.80 cm 2.28 cm/m   RA Area:     16.90 cm LA Vol (A2C):   68.8 ml 41.23 ml/m  RA Volume:   44.20 ml  26.49 ml/m LA Vol (A4C):   38.3 ml 22.95 ml/m LA Biplane Vol: 50.9 ml 30.51 ml/m  AORTIC VALVE AV Area (Vmax):    1.72 cm AV Area (Vmean):   2.05 cm AV Area (VTI):     1.92 cm AV Vmax:           192.00 cm/s AV Vmean:          106.000 cm/s AV VTI:            0.377 m AV Peak Grad:      14.7 mmHg AV Mean Grad:      6.0 mmHg LVOT Vmax:         105.00 cm/s LVOT Vmean:        69.300 cm/s LVOT VTI:          0.231 m LVOT/AV VTI ratio: 0.61 AI PHT:            778 msec  AORTA Ao Root diam: 3.30 cm MITRAL VALVE               TRICUSPID VALVE MV Area (PHT): 2.45 cm    TR Peak grad:   30.5 mmHg MV Decel Time: 310 msec    TR Vmax:        276.00 cm/s MV E velocity: 78.50 cm/s MV A velocity: 84.30 cm/s  SHUNTS MV E/A ratio:  0.93        Systemic VTI:  0.23 m                            Systemic Diam: 2.00 cm Charlton Haws MD Electronically signed by Charlton Haws MD Signature Date/Time: 04/13/2023/1:28:51 PM    Final    DG Chest Portable 1 View  Result Date: 04/12/2023 CLINICAL DATA:  Shortness of breath. EXAM: PORTABLE CHEST 1 VIEW COMPARISON:  February 02, 2023 FINDINGS: The heart  size and mediastinal contours are within normal limits. Mild atelectasis is seen within the bilateral lung bases. Small bilateral pleural effusions are noted. No pneumothorax is identified. A radiopaque surgical pin is seen overlying the right humeral head. Multilevel degenerative changes seen throughout the thoracic spine. IMPRESSION: Mild bibasilar atelectasis with small bilateral pleural effusions. Electronically Signed   By: Aram Candela M.D.   On: 04/12/2023 21:29   (Echo, Carotid, EGD, Colonoscopy, ERCP)    Subjective: Patient seen in the morning rounds.  Daughter at the bedside.  Patient denies any complaints.  Eager to go home.  She had episodes of blood pressure measured 85/56 with heart rate as high as 120 and sinus rhythm with mobility but patient does not have any symptoms.   Discharge Exam: Vitals:   04/26/23 0610 04/26/23 0700  BP:  90/63  Pulse:  81  Resp: 20 15  Temp:  98 F (36.7 C)  SpO2:  98%   Vitals:   04/26/23 0414 04/26/23 0600 04/26/23 0610 04/26/23 0700  BP: (!) 85/56   90/63  Pulse: 80   81  Resp: 17 20 20 15   Temp: 97.6 F (36.4 C)   98 F (36.7 C)  TempSrc: Oral   Oral  SpO2: 98%  98%  Weight:   61.3 kg   Height:        General: Pt is alert, awake, not in acute distress Cardiovascular: RRR, S1/S2 +, no rubs, no gallops Respiratory: CTA bilaterally, no wheezing, no rhonchi Abdominal: Soft, NT, ND, bowel sounds + Extremities: Trace bilateral pedal edema.  No cyanosis    The results of significant diagnostics from this hospitalization (including imaging, microbiology, ancillary and laboratory) are listed below for reference.     Microbiology: No results found for this or any previous visit (from the past 240 hour(s)).   Labs: BNP (last 3 results) Recent Labs    03/21/23 1146 04/12/23 1730  BNP 70.0 135.0*   Basic Metabolic Panel: Recent Labs  Lab 04/20/23 0106 04/20/23 1432 04/21/23 0406 04/22/23 0415 04/23/23 0520  04/24/23 0545  NA 135  --  135 139 138 136  K 4.3  --  3.8 3.6 3.8 4.0  CL 103  --  102 107 106 104  CO2 24  --  28 27 26 26   GLUCOSE 99  --  103* 101* 122* 79  BUN 14  --  10 7* 10 10  CREATININE 0.67  --  0.54 0.64 0.57 0.66  CALCIUM 7.2*  --  7.1* 7.2* 7.3* 7.2*  MG 1.8 1.7  --   --   --   --    Liver Function Tests: Recent Labs  Lab 04/20/23 0106 04/21/23 0406  AST 15 12*  ALT 18 15  ALKPHOS 76 63  BILITOT 0.4 0.5  PROT 3.9* 3.5*  ALBUMIN <1.5* <1.5*   No results for input(s): "LIPASE", "AMYLASE" in the last 168 hours. No results for input(s): "AMMONIA" in the last 168 hours. CBC: Recent Labs  Lab 04/21/23 0406 04/21/23 0921 04/22/23 0415 04/23/23 0520 04/24/23 0545  WBC 3.0* 5.5 5.6 5.9 10.8*  HGB 9.5* 10.0* 9.3* 9.5* 9.3*  HCT 29.7* 31.4* 29.2* 30.1* 29.4*  MCV 94.9 97.8 96.4 98.0 98.3  PLT 172 220 186 190 202   Cardiac Enzymes: No results for input(s): "CKTOTAL", "CKMB", "CKMBINDEX", "TROPONINI" in the last 168 hours. BNP: Invalid input(s): "POCBNP" CBG: Recent Labs  Lab 04/21/23 1952 04/23/23 1122  GLUCAP 128* 143*   D-Dimer No results for input(s): "DDIMER" in the last 72 hours. Hgb A1c No results for input(s): "HGBA1C" in the last 72 hours. Lipid Profile No results for input(s): "CHOL", "HDL", "LDLCALC", "TRIG", "CHOLHDL", "LDLDIRECT" in the last 72 hours. Thyroid function studies No results for input(s): "TSH", "T4TOTAL", "T3FREE", "THYROIDAB" in the last 72 hours.  Invalid input(s): "FREET3" Anemia work up No results for input(s): "VITAMINB12", "FOLATE", "FERRITIN", "TIBC", "IRON", "RETICCTPCT" in the last 72 hours. Urinalysis    Component Value Date/Time   COLORURINE YELLOW 04/15/2023 1546   APPEARANCEUR CLOUDY (A) 04/15/2023 1546   LABSPEC 1.019 04/15/2023 1546   PHURINE 6.0 04/15/2023 1546   GLUCOSEU NEGATIVE 04/15/2023 1546   HGBUR NEGATIVE 04/15/2023 1546   BILIRUBINUR NEGATIVE 04/15/2023 1546   KETONESUR NEGATIVE 04/15/2023  1546   PROTEINUR NEGATIVE 04/15/2023 1546   NITRITE NEGATIVE 04/15/2023 1546   LEUKOCYTESUR TRACE (A) 04/15/2023 1546   Sepsis Labs Recent Labs  Lab 04/21/23 0921 04/22/23 0415 04/23/23 0520 04/24/23 0545  WBC 5.5 5.6 5.9 10.8*   Microbiology No results found for this or any previous visit (from the past 240 hour(s)).   Time coordinating discharge: 35 minutes  SIGNED:   Dorcas Carrow, MD  [facility] 04/26/2023, 1:08 PM

## 2023-04-26 NOTE — Progress Notes (Signed)
PICC Removal Note: PICC line removed from patient's RUE per MD order.  PICC catheter tip visualized and intact.  Pressure held until hemostasis achieved. Pressure dressing applied. No redness, ecchymosis, edema, swelling, or drainage noted at site. Instructions provided on post PICC discharge care, including followup notification instructions. Bedrest for 30 minutes post removal.

## 2023-04-26 NOTE — Progress Notes (Signed)
Physical Therapy Treatment Patient Details Name: Kayla Dean MRN: [id number] DOB: 05-10-1951 Today's Date: 04/26/2023   History of Present Illness 72 yo female admitted 6/27 from MD office with hypotension with pressor requirement. Pt with PE and colitis. PMHx; exlap with incarcerated hernia reduction 02/02/23, gastric bypass, Rt THA, Rt TKA, Rt RCR    PT Comments  Pt received in supine and agreeable to session. Pt reports feeling much better today and is excited to go home. Pt performing all mobility with up to supervision for safety, but demonstrates no overt LOB. Pt is unable to stand without UE support, however is able to stand with limited UE support x5 with slow, steady power up. Education provided on HR monitoring and activity pacing with pt verbalizing understanding. Anticipate pt and family will be able to manage pt's mobility needs at home when pt is medically ready for discharge.     Assistance Recommended at Discharge Intermittent Supervision/Assistance  If plan is discharge home, recommend the following:  Can travel by private vehicle    A little help with walking and/or transfers;A little help with bathing/dressing/bathroom;Assistance with cooking/housework;Assist for transportation      Equipment Recommendations       Recommendations for Other Services       Precautions / Restrictions Precautions Precautions: Fall;Other (comment) Precaution Comments: watch HR and BP Restrictions Weight Bearing Restrictions: No     Mobility  Bed Mobility Overal bed mobility: Modified Independent Bed Mobility: Supine to Sit                Transfers Overall transfer level: Needs assistance Equipment used: Rolling walker (2 wheels), None Transfers: Sit to/from Stand Sit to Stand: Supervision           General transfer comment: From EOB x1 and recliner x5 with cues for anterior lean    Ambulation/Gait Ambulation/Gait assistance: Supervision Gait Distance  (Feet): 200 Feet Assistive device: Rolling walker (2 wheels) Gait Pattern/deviations: Step-through pattern       General Gait Details: Slow, steady gait with light UE support on RW       Balance Overall balance assessment: Needs assistance Sitting-balance support: Feet supported, No upper extremity supported Sitting balance-Leahy Scale: Good Sitting balance - Comments: sitting EOB   Standing balance support: Bilateral upper extremity supported, During functional activity Standing balance-Leahy Scale: Fair Standing balance comment: with RW support                            Cognition Arousal/Alertness: Awake/alert Behavior During Therapy: WFL for tasks assessed/performed Overall Cognitive Status: Within Functional Limits for tasks assessed                                          Exercises      General Comments General comments (skin integrity, edema, etc.): HR max 127 bpm      Pertinent Vitals/Pain Pain Assessment Pain Assessment: No/denies pain     PT Goals (current goals can now be found in the care plan section) Acute Rehab PT Goals Patient Stated Goal: return home PT Goal Formulation: With patient/family Time For Goal Achievement: 05/03/23 Potential to Achieve Goals: Good Progress towards PT goals: Progressing toward goals    Frequency    Min 3X/week      PT Plan Current plan remains appropriate       AM-PAC  PT "6 Clicks" Mobility   Outcome Measure  Help needed turning from your back to your side while in a flat bed without using bedrails?: None Help needed moving from lying on your back to sitting on the side of a flat bed without using bedrails?: A Little Help needed moving to and from a bed to a chair (including a wheelchair)?: A Little Help needed standing up from a chair using your arms (e.g., wheelchair or bedside chair)?: A Little Help needed to walk in hospital room?: A Little Help needed climbing 3-5 steps  with a railing? : A Little 6 Click Score: 19    End of Session   Activity Tolerance: Patient tolerated treatment well Patient left: in chair;with call bell/phone within reach;with family/visitor present Nurse Communication: Mobility status PT Visit Diagnosis: Other abnormalities of gait and mobility (R26.89)     Time: 2956-2130 PT Time Calculation (min) (ACUTE ONLY): 11 min  Charges:    $Gait Training: 8-22 mins PT General Charges $$ ACUTE PT VISIT: 1 Visit                     Johny Shock, PTA Acute Rehabilitation Services Secure Chat Preferred  [phone]    Johny Shock 04/26/2023, 9:11 AM

## 2023-04-26 NOTE — TOC Transition Note (Signed)
Transition of Care (TOC) - CM/SW Discharge Note Donn Pierini RN, BSN Transitions of Care Unit 4E- RN Case Manager See Treatment Team for direct phone #   Patient Details  Name: Kayla Dean MRN: [id number] Date of Birth: 1951/08/17  Transition of Care [facility]) CM/SW Contact:  Darrold Span, RN Phone Number: 04/26/2023, 12:09 PM   Clinical Narrative:    Pt stable for transition home today, daughter at bedside for transport.  CM updated pt on HH arrangements- [facility] has accepted for HHPT/OT needs.   [facility] liaison updated on transition home today as well as HH orders placed.   No further TOC needs noted.    Final next level of care: Home w Home Health Services Barriers to Discharge: Barriers Resolved   Patient Goals and CMS Choice CMS Medicare.gov Compare Post Acute Care list provided to:: Patient Choice offered to / list presented to : Patient  Discharge Placement                 Home w/ [facility]        Discharge Plan and Services Additional resources added to the After Visit Summary for     Discharge Planning Services: CM Consult Post Acute Care Choice: Home Health          DME Arranged: N/A DME Agency: NA       HH Arranged: PT, OT HH Agency: [facility] Date [facility] Agency Contacted: 04/25/23 Time HH Agency Contacted: 1130 Representative spoke with at [facility] Agency: Kandee Keen  Social Determinants of Health (SDOH) Interventions SDOH Screenings   Food Insecurity: No Food Insecurity (04/13/2023)  Housing: Low Risk  (04/13/2023)  Transportation Needs: No Transportation Needs (04/13/2023)  Utilities: Not At Risk (04/13/2023)  Tobacco Use: Low Risk  (04/12/2023)     Readmission Risk Interventions    04/26/2023   12:09 PM  Readmission Risk Prevention Plan  Transportation Screening Complete  PCP or Specialist Appt within 3-5 Days Complete  HRI or Home Care Consult Complete  Social Work Consult for Recovery Care Planning/Counseling Complete  Palliative  Care Screening Not Applicable  Medication Review Oceanographer) Complete

## 2023-04-30 ENCOUNTER — Telehealth: Payer: Self-pay | Admitting: Cardiology

## 2023-04-30 NOTE — Telephone Encounter (Signed)
Home health PT called and asked if pt should continue to wear thigh high compression hose or if she can do knee high? PT also states that as of now the perimeters states to call for BP 90/60. Pt's BP today was 102/64 sitting and 96/58 standing. Please advise.

## 2023-04-30 NOTE — Telephone Encounter (Signed)
New Message:      She needs to talk to the nurse, she have some questions. Patient was discharged from [facility] on 04-26-23.

## 2023-05-01 ENCOUNTER — Telehealth: Payer: Self-pay | Admitting: *Deleted

## 2023-05-01 NOTE — Telephone Encounter (Signed)
Chris notified of Dr. Ival Bible response. Thayer Ohm voiced understanding.

## 2023-05-01 NOTE — Telephone Encounter (Signed)
Returned call to Ryder. No answer. Left msg to call back.

## 2023-05-01 NOTE — Telephone Encounter (Signed)
Received call from Wayland Denis, [facility] PT with [facility] [phone]~ telephone.   Reports that patient has been discharged from [facility] for increased peripheral edema. Reports that patient requires home health orders for PT and SN, but she does not have PCP at this time. States that cardiology refused HH orders.   Patient has F/U appointment with Dr. Henreitta Leber on 05/10/2023 for abdominal pain.   Patient has F/U with cardiology on 05/11/2023 for medication management. Patient has new patient appointment with Dr. Durwin Nora on 06/15/2023.  Inquired if provider will sign HH orders x30 days until patient can establish with PCP.   Advised that Dr. Henreitta Leber is currently out of the office and will not return until Monday, 05/07/2023. Advised that message will be routed to provider for review.

## 2023-05-01 NOTE — Telephone Encounter (Signed)
Chris returned RN's call.

## 2023-05-07 ENCOUNTER — Other Ambulatory Visit (HOSPITAL_COMMUNITY)
Admission: RE | Admit: 2023-05-07 | Discharge: 2023-05-07 | Disposition: A | Discharge status: Home / Self Care | Payer: Medicare Other | Source: Ambulatory Visit | Attending: Cardiology | Admitting: Cardiology

## 2023-05-07 DIAGNOSIS — R0609 Other forms of dyspnea: Secondary | ICD-10-CM | POA: Diagnosis present

## 2023-05-07 LAB — BASIC METABOLIC PANEL
Anion gap: 4 — ABNORMAL LOW (ref 5–15)
BUN: 23 mg/dL (ref 8–23)
CO2: 28 mmol/L (ref 22–32)
Calcium: 7.6 mg/dL — ABNORMAL LOW (ref 8.9–10.3)
Chloride: 104 mmol/L (ref 98–111)
Creatinine, Ser: 0.47 mg/dL (ref 0.44–1.00)
GFR, Estimated: >60 mL/min (ref >60)
Glucose, Bld: 89 mg/dL (ref 70–99)
Potassium: 4.5 mmol/L (ref 3.5–5.1)
Sodium: 136 mmol/L (ref 135–145)

## 2023-05-07 NOTE — Telephone Encounter (Signed)
Dr. Henreitta Leber reviewed and agreeable to signing orders for PT. Call placed to [facility], PT with  [facility] and verbal orders given.   Provider requested to keep appt to f/u with patient. Patient agreeable to plan.

## 2023-05-09 ENCOUNTER — Telehealth: Payer: Self-pay | Admitting: *Deleted

## 2023-05-09 NOTE — Telephone Encounter (Signed)
Received call from Wayland Denis, [facility] PT with [facility] [phone]~ telephone.   Reports that patient had unattended fall on 05/08/2023 with no injury noted.   Dr. Henreitta Leber to be made aware.

## 2023-05-10 ENCOUNTER — Telehealth: Payer: Self-pay | Admitting: *Deleted

## 2023-05-10 ENCOUNTER — Ambulatory Visit (INDEPENDENT_AMBULATORY_CARE_PROVIDER_SITE_OTHER): Payer: Medicare Other | Admitting: General Surgery

## 2023-05-10 ENCOUNTER — Encounter: Payer: Self-pay | Admitting: General Surgery

## 2023-05-10 ENCOUNTER — Encounter: Payer: Self-pay | Admitting: *Deleted

## 2023-05-10 VITALS — BP 106/74 | HR 74 | Temp 98.3°F | Resp 14 | Ht 65.0 in | Wt 134.0 lb

## 2023-05-10 DIAGNOSIS — I2699 Other pulmonary embolism without acute cor pulmonale: Secondary | ICD-10-CM | POA: Diagnosis not present

## 2023-05-10 DIAGNOSIS — K436 Other and unspecified ventral hernia with obstruction, without gangrene: Secondary | ICD-10-CM | POA: Diagnosis not present

## 2023-05-10 DIAGNOSIS — R109 Unspecified abdominal pain: Secondary | ICD-10-CM | POA: Diagnosis not present

## 2023-05-10 DIAGNOSIS — R1084 Generalized abdominal pain: Secondary | ICD-10-CM | POA: Diagnosis not present

## 2023-05-10 NOTE — Telephone Encounter (Signed)
Received new orders from Dr. Henreitta Leber for work note to excuse dates of 04/12/2023- 06/15/2023.  Noted transcribed and e-mailed to: [email or web address]  Delivery receipt received.

## 2023-05-10 NOTE — Progress Notes (Signed)
[facility]  Patient well known to me and was admitted to the hospital the last time I saw her for low BP and was found to have a PE. She is now on blood thinners; Her SB continues to have edema and concern for congestion. I do not agree with the CT read calling it "chronic mesenteric ischemia" but rather do think that her prior resections and the stress of her small bowel incarceration led to decreased venous drainage and possibly decreased arterial flow to the area. She is still able to eat and have Bms. Unfortunately any look for redoing her anastomosis and removing this portion of intestine is on the back burner as she is going to be on anticoagulation for at least 3 months uninterrupted I would think.  She had just been to the beach in the car prior to the DVT/PE.  She is eating but has episodes of pain at times. She is having Bms. She is doing boost to help with weight and protein intake.  She is working with therapy.   BP 106/74   Pulse 74   Temp 98.3 F (36.8 C) (Oral)   Resp 14   Ht 5\' 5"  (1.651 m)   Wt 134 lb (60.8 kg)   SpO2 97%   BMI 22.30 kg/m  Soft, nondistended, tender with palpation, no rebound or guarding  Healed incision   Patient s/p Ex lap, primary repair of ventral hernia. After this episode of incarceration her intestines have been edematous and have not fully recovered. She will likely need revision in the future but will need to be able to get off blood thinners, will need to be stronger, have more protein reserve, and we will need to get her into see a bariatric surgeon as her anatomy is not straight forward (see operative note drawing). She and her daughter understand this is all a possibility and we are taking it day by day.   HH orders sent in for patient Diet and activity as tolerated Work with PT at home Call with issues. Keep stools regular and soft.  Gets set up with PCP in August, sent Dr. Durwin Nora a message so he knows he can call me with  any questions  I will see in September unless she needs me before then   Future Appointments  Date Time Provider Department Center  05/11/2023 10:30 AM Sharlene Dory, NP [facility]  05/22/2023  3:00 PM AP - ECHO 1 OUTPATIENT [facility]  06/15/2023  4:00 PM Billie Lade, MD [facility]  07/03/2023 11:45 AM Lucretia Roers, MD RS-RS None   Algis Greenhouse, MD [facility] [address] [place], [place] [address] [phone] (office)

## 2023-05-10 NOTE — Patient Instructions (Signed)
Diet and activity as tolerated Work with PT at home Call with issues. Keep stools regular and soft.

## 2023-05-11 ENCOUNTER — Ambulatory Visit: Payer: Medicare Other | Attending: Nurse Practitioner | Admitting: Nurse Practitioner

## 2023-05-11 ENCOUNTER — Encounter: Payer: Self-pay | Admitting: Nurse Practitioner

## 2023-05-11 VITALS — BP 100/62 | HR 94 | Ht 65.0 in | Wt 134.2 lb

## 2023-05-11 DIAGNOSIS — I82451 Acute embolism and thrombosis of right peroneal vein: Secondary | ICD-10-CM

## 2023-05-11 DIAGNOSIS — I2699 Other pulmonary embolism without acute cor pulmonale: Secondary | ICD-10-CM

## 2023-05-11 DIAGNOSIS — I77819 Aortic ectasia, unspecified site: Secondary | ICD-10-CM

## 2023-05-11 DIAGNOSIS — M25473 Effusion, unspecified ankle: Secondary | ICD-10-CM

## 2023-05-11 DIAGNOSIS — K551 Chronic vascular disorders of intestine: Secondary | ICD-10-CM

## 2023-05-11 DIAGNOSIS — M7989 Other specified soft tissue disorders: Secondary | ICD-10-CM

## 2023-05-11 DIAGNOSIS — R0609 Other forms of dyspnea: Secondary | ICD-10-CM

## 2023-05-11 DIAGNOSIS — I959 Hypotension, unspecified: Secondary | ICD-10-CM | POA: Diagnosis not present

## 2023-05-11 DIAGNOSIS — R600 Localized edema: Secondary | ICD-10-CM

## 2023-05-11 MED ORDER — APIXABAN 5 MG PO TABS: 5.0000 mg | ORAL_TABLET | Freq: Two times a day (BID) | ORAL | 2 refills | Status: DC

## 2023-05-11 NOTE — Patient Instructions (Addendum)
Medication Instructions:  Your physician recommends that you continue on your current medications as directed. Please refer to the Current Medication list given to you today.  Labwork: none  Testing/Procedures: none  Follow-Up: Your physician recommends that you schedule a follow-up appointment in: 2-3 Months with Philis Nettle   Any Other Special Instructions Will Be Listed Below (If Applicable).  6  If you need a refill on your cardiac medications before your next appointment, please call your pharmacy.

## 2023-05-11 NOTE — Progress Notes (Addendum)
Cardiology Office Note:  .   Date:  05/11/2023 ID:  Kayla Dean, DOB 09-19-1951, MRN [id number] PCP: Billie Lade, MD  [facility] Providers Cardiologist:  Nona Dell, MD    History of Present Illness: .   Kayla Dean is a 72 y.o. female with a PMH of chest pain, palpitations, hypothyroidism, aortic dilatation, hiatal hernia, and hx of gastric bypass, history of hypertension, recent acute PE/DVT, chronic mesenteric ischemia, who presents today for swelling and DOE evaluation.   Last seen by Dr. Diona Browner on November 15, 2022.  She was referred for evaluation by her PCP after ER visit with chest discomfort.  Was ruled out for ACS at that time.  Chest CTA was negative for PE, incidentally noted moderate size hiatal hernia, mild cardiomegaly, ascending thoracic aortic dimension of 4 cm.  At office visit with Dr. Diona Browner, she endorsed right-sided thoracic discomfort/tightness, denied any specific triggers. CCTA revealed mild nonobstructive CAD with evidence of hiatal hernia, borderline dilatation of ascending aorta 39 mm. Cardiac monitor arranged was overall reassuring.   Underwent exploratory laparotomy and reduction of incarcerated hernia in April 2024.  Hospital admission from end of June 2024 to early July 2024 for hypotension, was transferred to [facility] and eventually admitted to ICU and resuscitated with fluids and then subsequently treated with vasopressors, also treated with antibiotics.  Was started on midodrine and hydrocortisone, blood pressure was stabilized.  Etiology found to be multifactorial.  Suspected volume depletion, ischemic colitis.  Sepsis ruled out.  Hospital course complicated by acute PE and acute right peroneal vein DVT that was felt to be probably aggravated with recent surgery, was started on Eliquis.  Today she presents for follow-up. She shows me her BP log that shows consistently low SBP since leaving the hospital with more recently  improved BP readings over the last 24 hours with max BP reading 121/83. Weight is stable. Says she is feeling better since leaving the hospital, but not as active, does admit to some DOE and leg swelling inhibits her mobility. Denies any chest pain, sustained palpitations, syncope, presyncope, dizziness, orthopnea, PND, significant weight changes, acute bleeding, or claudication. She notes leg swelling has reduced, however still notes pedal/ankle swelling, currently wearing compression stockings.   Studies Reviewed: Marland Kitchen    Vascular ultrasound lower extremity venous bilateral (04/21/2023): Summary:  RIGHT:  - Findings consistent with acute deep vein thrombosis involving the right  peroneal veins.  - No cystic structure found in the popliteal fossa.    LEFT:  - There is no evidence of deep vein thrombosis in the lower extremity.    - No cystic structure found in the popliteal fossa.  Echo 03/2023: 1. Left ventricular ejection fraction, by estimation, is 60 to 65%. The  left ventricle has normal function. The left ventricle has no regional  wall motion abnormalities. Left ventricular diastolic parameters were  normal.   2. Right ventricular systolic function is normal. The right ventricular  size is normal.   3. Left atrial size was mildly dilated.   4. The mitral valve is abnormal. Trivial mitral valve regurgitation. No  evidence of mitral stenosis.   5. The aortic valve is tricuspid. There is moderate calcification of the  aortic valve. There is moderate thickening of the aortic valve. Aortic  valve regurgitation is mild. Aortic valve sclerosis is present, with no  evidence of aortic valve stenosis.   6. The inferior vena cava is normal in size with greater than 50%  respiratory variability, suggesting right atrial pressure of 3 mmHg.  Cardiac monitor 11/2022: Predominant rhythm is sinus with heart rate ranging from 53 bpm up to 145 bpm and average heart rate 77 bpm. There were rare PACs  including atrial couplets and triplets representing less than 1% total beats. There were rare PVCs including ventricular couplets representing less than 1% total beats.  Also limited episodes of ventricular bigeminy and trigeminy. There were two brief episodes of SVT, the longest of which lasted for 7 beats.  Neither of these were patient triggered events. No sustained arrhythmias or pauses.  CCTA 11/2022: IMPRESSION: 1. Mild nonobstructive CAD, CADRADS = 2.   2. Coronary calcium score of 122. This was 71st percentile for age and sex matched control.   3. Normal coronary origin with right dominance.   4.  Hiatal hernia present.   5.  Borderline dilation of ascending aorta at 39 mm.  Lexiscan 07/2018: There was no ST segment deviation noted during stress. The study is normal. There are no perfusion defects consistent with prior infarct or ischemia. The left ventricular ejection fraction is hyperdynamic (>65%). This is a low risk study.  Physical Exam:   VS:  BP 100/62   Pulse 94   Ht 5\' 5"  (1.651 m)   Wt 134 lb 3.2 oz (60.9 kg)   SpO2 97%   BMI 22.33 kg/m    Wt Readings from Last 3 Encounters:  05/11/23 134 lb 3.2 oz (60.9 kg)  05/10/23 134 lb (60.8 kg)  04/26/23 135 lb 3.2 oz (61.3 kg)    GEN: Well nourished, well developed in no acute distress NECK: No JVD; No carotid bruits CARDIAC: S1/S2, RRR, no murmurs, rubs, gallops RESPIRATORY:  Clear to auscultation without rales, wheezing or rhonchi  ABDOMEN: Soft, non-tender, non-distended EXTREMITIES:  Nonpitting edema to BLE (compression stockings to BLE); No deformity   ASSESSMENT AND PLAN: .    Hypotension Recent low blood pressures, however recent BP improvement. BP stable today. Completed steroid taper. Continue midodrine. Currently not on any other antihypertensives at the time. Discussed to monitor BP at home at least 2 hours after medications and sitting for 5-10 minutes. Given BP log today. Care and ED precautions  discussed. Continue to follow with PCP.  Acute PE/DVT, DOE CT of abdomen 04/2023 revealed PE of right interlobar pulmonary artery and proximal and subsegmental pulmonary arteries. Etiology felt to be aggravated by recent surgery and acute DVT noted along right peroneal veins. Tolerating Eliquis well and on appropriate dosage. Will refill Eliquis for her today. Will place referral to Hem/Onc for further evaluation and management. Does admit to some DOE but symptoms are stable and multifactorial, suspect some deconditioning r/t to recent ICU stay in hospital, echo overall looked benign. Continue to follow with PCP.  Aortic dilatation CCTA 11/2022 showed borderline dilatation of ascending aorta at 39 mm. Recommend repeating imaging in 1 year for monitoring. Care and ED precautions discussed. Continue to follow with PCP.  Chronic mesenteric ischemia Denies any recent symptoms. Currently being followed by general surgery. Continue to follow-up with Dr. Henreitta Leber as scheduled.   Leg swelling with pedal edema and ankle edema  Stable per her report, now more dependent with pedal edema and ankle edema. She is currently wearing her compression stockings. Echo 03/2023 showed EF 60-65%. Currently not on diuretic d/t hx of low BP. Salty six diet sheet given, leg elevation discussed, and encouraged her to wear compression stockings as stockings are generally preventive and do not treat existing  clots, unable to start diuretic at this time with BP. Would recommend waiting until further improvement in BP before initiating diuretic, will continue to monitor. Heart healthy diet encouraged. Continue to follow with PCP.  Dispo: Follow-up with me or APP in 2-3 months or sooner if anything changes.   Signed, Sharlene Dory, NP

## 2023-05-14 ENCOUNTER — Telehealth (INDEPENDENT_AMBULATORY_CARE_PROVIDER_SITE_OTHER): Payer: Medicare Other | Admitting: General Surgery

## 2023-05-14 DIAGNOSIS — R1084 Generalized abdominal pain: Secondary | ICD-10-CM

## 2023-05-14 MED ORDER — OXYCODONE HCL 5 MG PO TABS: 5.0000 mg | ORAL_TABLET | ORAL | 0 refills | Status: DC | PRN

## 2023-05-14 NOTE — Telephone Encounter (Signed)
[facility] Rx refilled today. Had discussed in clinic last week.  Algis Greenhouse, MD [facility] [address] [place], [place] [address] [phone] (office)

## 2023-05-15 NOTE — Telephone Encounter (Signed)
Patient aware via vm 

## 2023-05-16 ENCOUNTER — Other Ambulatory Visit: Payer: Self-pay

## 2023-05-16 ENCOUNTER — Telehealth: Payer: Self-pay | Admitting: *Deleted

## 2023-05-16 ENCOUNTER — Telehealth: Payer: Self-pay | Admitting: Nurse Practitioner

## 2023-05-16 ENCOUNTER — Emergency Department (HOSPITAL_COMMUNITY)
Admission: EM | Admit: 2023-05-16 | Discharge: 2023-05-16 | Disposition: A | Discharge status: Home / Self Care | Payer: Medicare Other | Attending: Emergency Medicine | Admitting: Emergency Medicine

## 2023-05-16 ENCOUNTER — Emergency Department (HOSPITAL_COMMUNITY): Payer: Medicare Other

## 2023-05-16 ENCOUNTER — Encounter (HOSPITAL_COMMUNITY): Payer: Self-pay

## 2023-05-16 DIAGNOSIS — Z86718 Personal history of other venous thrombosis and embolism: Secondary | ICD-10-CM | POA: Insufficient documentation

## 2023-05-16 DIAGNOSIS — S6992XA Unspecified injury of left wrist, hand and finger(s), initial encounter: Secondary | ICD-10-CM | POA: Diagnosis present

## 2023-05-16 DIAGNOSIS — S62645A Nondisplaced fracture of proximal phalanx of left ring finger, initial encounter for closed fracture: Secondary | ICD-10-CM | POA: Diagnosis not present

## 2023-05-16 DIAGNOSIS — Z7901 Long term (current) use of anticoagulants: Secondary | ICD-10-CM | POA: Insufficient documentation

## 2023-05-16 DIAGNOSIS — S0083XA Contusion of other part of head, initial encounter: Secondary | ICD-10-CM | POA: Diagnosis not present

## 2023-05-16 DIAGNOSIS — Z79899 Other long term (current) drug therapy: Secondary | ICD-10-CM

## 2023-05-16 DIAGNOSIS — W19XXXA Unspecified fall, initial encounter: Secondary | ICD-10-CM | POA: Insufficient documentation

## 2023-05-16 DIAGNOSIS — Z86711 Personal history of pulmonary embolism: Secondary | ICD-10-CM | POA: Insufficient documentation

## 2023-05-16 NOTE — ED Provider Notes (Signed)
EMERGENCY DEPARTMENT AT [facility] Provider Note   CSN: [id number] Arrival date & time: 05/16/23  1737     History  Chief Complaint  Patient presents with   Marletta Lor    Kayla Dean is a 72 y.o. female.  Patient with history of DVT/PE on Eliquis presents today with complaints of fall.  She states that same occurred yesterday when she sustained a mechanical fall.  States she fell forward and caught herself with her left hand.  Does state that she also hit her head on the ground.  She did not lose consciousness.  Was able to get up off the ground and walk around without issue.  States that she went to see occupational therapy today for a routine scheduled appointment and they noticed she had a bruise on her left forehead.  When she told them that she fell, they recommended she come here for imaging given that she is on blood thinners.  She presents for same.  She denies any headaches.  Does note pain to her left hand with swelling and bruising noted as well. Denies vision changes, neck pain, chest pain, shortness of breath.  The history is provided by the patient. No language interpreter was used.  Fall       Home Medications Prior to Admission medications   Medication Sig Start Date End Date Taking? Authorizing Provider  acetaminophen (TYLENOL) 500 MG tablet Take 1,000 mg by mouth every 8 (eight) hours as needed for moderate pain.    [provider]  apixaban (ELIQUIS) 5 MG TABS tablet Take 1 tablet (5 mg total) by mouth 2 (two) times daily. 05/11/23 08/09/23  Sharlene Dory, NP  Calcium Carb-Cholecalciferol (CALCIUM 600 + D) 600-200 MG-UNIT TABS Take 1 tablet by mouth every morning.    [provider]  Cholecalciferol (VITAMIN D-3 PO) Take 1 capsule by mouth daily.    [provider]  ferrous sulfate 325 (65 FE) MG tablet Take 325 mg by mouth every morning.    [provider]  hydrocortisone (CORTEF) 5 MG tablet 4 tablets  daily for 1 day 3 tablets daily for 1 day 2 tablets daily for 1 day 1 tablet daily for 1 day 04/27/23   Dorcas Carrow, MD  levothyroxine (SYNTHROID, LEVOTHROID) 137 MCG tablet Take 137 mcg by mouth daily before breakfast.    [provider]  midodrine (PROAMATINE) 5 MG tablet Take 3 tablets (15 mg total) by mouth every 8 (eight) hours. 04/26/23 05/26/23  Dorcas Carrow, MD  Multiple Vitamin (MULTIVITAMIN WITH MINERALS) TABS tablet Take 1 tablet by mouth every morning.    [provider]  ondansetron (ZOFRAN-ODT) 4 MG disintegrating tablet Take 1 tablet (4 mg total) by mouth every 6 (six) hours as needed for nausea. 02/08/23   Lucretia Roers, MD  oxyCODONE (OXY IR/ROXICODONE) 5 MG immediate release tablet Take 1 tablet (5 mg total) by mouth every 4 (four) hours as needed for severe pain or breakthrough pain. 05/14/23   Lucretia Roers, MD  zolpidem (AMBIEN) 5 MG tablet Take 1 tablet (5 mg total) by mouth at bedtime as needed for sleep. 02/15/23   Lucretia Roers, MD      Allergies    Patient has no known allergies.    Review of Systems   Review of Systems  Musculoskeletal:  Positive for arthralgias.  All other systems reviewed and are negative.   Physical Exam Updated Vital Signs BP 107/89 (BP Location: Right Arm)  Pulse (!) 108   Temp 98.5 F (36.9 C) (Oral)   Resp 18   Ht 5\' 5"  (1.651 m)   Wt 59.9 kg   SpO2 95%   BMI 21.97 kg/m  Physical Exam Vitals and nursing note reviewed.  Constitutional:      General: She is not in acute distress.    Appearance: Normal appearance. She is normal weight. She is not ill-appearing, toxic-appearing or diaphoretic.  HENT:     Head: Normocephalic and atraumatic.     Comments: Small hematoma noted to the left forehead without any crepitus or deformity. Eyes:     Extraocular Movements: Extraocular movements intact.     Pupils: Pupils are equal, round, and reactive to light.  Cardiovascular:     Rate and Rhythm: Normal  rate.  Pulmonary:     Effort: Pulmonary effort is normal. No respiratory distress.  Abdominal:     General: Abdomen is flat.     Palpations: Abdomen is soft.     Tenderness: There is no abdominal tenderness.  Musculoskeletal:        General: Normal range of motion.     Cervical back: Normal range of motion and neck supple. No tenderness.     Comments: TTP over the left 3rd 4th and 5th digits with bruising present. Tenderness worse over the 4th MCP area. Capillary refill less than 2 seconds. ROM intact. Distal sensation intact.   No other areas of focal bony tenderness.  Patient observed to be ambulatory with steady gait.  Skin:    General: Skin is warm and dry.  Neurological:     General: No focal deficit present.     Mental Status: She is alert.  Psychiatric:        Mood and Affect: Mood normal.        Behavior: Behavior normal.     ED Results / Procedures / Treatments   Labs (all labs ordered are listed, but only abnormal results are displayed) Labs Reviewed - No data to display  EKG None  Radiology CT Head Wo Contrast  Result Date: 05/16/2023 CLINICAL DATA:  Minor head trauma. Fell yesterday. Blood thinners. Bruising to the left periorbital area. EXAM: CT HEAD WITHOUT CONTRAST CT MAXILLOFACIAL WITHOUT CONTRAST CT CERVICAL SPINE WITHOUT CONTRAST TECHNIQUE: Multidetector CT imaging of the head, cervical spine, and maxillofacial structures were performed using the standard protocol without intravenous contrast. Multiplanar CT image reconstructions of the cervical spine and maxillofacial structures were also generated. RADIATION DOSE REDUCTION: This exam was performed according to the departmental dose-optimization program which includes automated exposure control, adjustment of the mA and/or kV according to patient size and/or use of iterative reconstruction technique. COMPARISON:  None Available. FINDINGS: CT HEAD FINDINGS Brain: Diffuse cerebral atrophy. Ventricular dilatation  consistent with central atrophy. Low-attenuation changes in the deep white matter consistent with small vessel ischemia. No abnormal extra-axial fluid collections. No mass effect or midline shift. Gray-white matter junctions are distinct. Basal cisterns are not effaced. No acute intracranial hemorrhage. Vascular: No hyperdense vessel or unexpected calcification. Skull: Normal. Negative for fracture or focal lesion. Other: None. CT MAXILLOFACIAL FINDINGS Osseous: No fracture or mandibular dislocation. No destructive process. Degenerative changes in the temporomandibular joints. Edentulous. Orbits: Negative. No traumatic or inflammatory finding. Sinuses: Clear. Soft tissues: Negative. CT CERVICAL SPINE FINDINGS Alignment: Normal. Skull base and vertebrae: No acute fracture. No primary bone lesion or focal pathologic process. Soft tissues and spinal canal: No prevertebral fluid or swelling. No visible canal hematoma. Disc levels:  Degenerative changes throughout the cervical spine with narrowed disc spaces and endplate osteophyte formation. Upper chest: Visualized lung apices are clear. Other: None. IMPRESSION: 1. No acute intracranial abnormalities. Chronic atrophy and small vessel ischemic changes. 2. No acute displaced orbital or facial fractures identified. 3. Normal alignment of the cervical spine. Diffuse degenerative changes. No acute displaced fractures identified. Electronically Signed   By: Burman Nieves M.D.   On: 05/16/2023 18:39   CT Maxillofacial Wo Contrast  Result Date: 05/16/2023 CLINICAL DATA:  Minor head trauma. Fell yesterday. Blood thinners. Bruising to the left periorbital area. EXAM: CT HEAD WITHOUT CONTRAST CT MAXILLOFACIAL WITHOUT CONTRAST CT CERVICAL SPINE WITHOUT CONTRAST TECHNIQUE: Multidetector CT imaging of the head, cervical spine, and maxillofacial structures were performed using the standard protocol without intravenous contrast. Multiplanar CT image reconstructions of the  cervical spine and maxillofacial structures were also generated. RADIATION DOSE REDUCTION: This exam was performed according to the departmental dose-optimization program which includes automated exposure control, adjustment of the mA and/or kV according to patient size and/or use of iterative reconstruction technique. COMPARISON:  None Available. FINDINGS: CT HEAD FINDINGS Brain: Diffuse cerebral atrophy. Ventricular dilatation consistent with central atrophy. Low-attenuation changes in the deep white matter consistent with small vessel ischemia. No abnormal extra-axial fluid collections. No mass effect or midline shift. Gray-white matter junctions are distinct. Basal cisterns are not effaced. No acute intracranial hemorrhage. Vascular: No hyperdense vessel or unexpected calcification. Skull: Normal. Negative for fracture or focal lesion. Other: None. CT MAXILLOFACIAL FINDINGS Osseous: No fracture or mandibular dislocation. No destructive process. Degenerative changes in the temporomandibular joints. Edentulous. Orbits: Negative. No traumatic or inflammatory finding. Sinuses: Clear. Soft tissues: Negative. CT CERVICAL SPINE FINDINGS Alignment: Normal. Skull base and vertebrae: No acute fracture. No primary bone lesion or focal pathologic process. Soft tissues and spinal canal: No prevertebral fluid or swelling. No visible canal hematoma. Disc levels: Degenerative changes throughout the cervical spine with narrowed disc spaces and endplate osteophyte formation. Upper chest: Visualized lung apices are clear. Other: None. IMPRESSION: 1. No acute intracranial abnormalities. Chronic atrophy and small vessel ischemic changes. 2. No acute displaced orbital or facial fractures identified. 3. Normal alignment of the cervical spine. Diffuse degenerative changes. No acute displaced fractures identified. Electronically Signed   By: Burman Nieves M.D.   On: 05/16/2023 18:39   CT Cervical Spine Wo Contrast  Result Date:  05/16/2023 CLINICAL DATA:  Minor head trauma. Fell yesterday. Blood thinners. Bruising to the left periorbital area. EXAM: CT HEAD WITHOUT CONTRAST CT MAXILLOFACIAL WITHOUT CONTRAST CT CERVICAL SPINE WITHOUT CONTRAST TECHNIQUE: Multidetector CT imaging of the head, cervical spine, and maxillofacial structures were performed using the standard protocol without intravenous contrast. Multiplanar CT image reconstructions of the cervical spine and maxillofacial structures were also generated. RADIATION DOSE REDUCTION: This exam was performed according to the departmental dose-optimization program which includes automated exposure control, adjustment of the mA and/or kV according to patient size and/or use of iterative reconstruction technique. COMPARISON:  None Available. FINDINGS: CT HEAD FINDINGS Brain: Diffuse cerebral atrophy. Ventricular dilatation consistent with central atrophy. Low-attenuation changes in the deep white matter consistent with small vessel ischemia. No abnormal extra-axial fluid collections. No mass effect or midline shift. Gray-white matter junctions are distinct. Basal cisterns are not effaced. No acute intracranial hemorrhage. Vascular: No hyperdense vessel or unexpected calcification. Skull: Normal. Negative for fracture or focal lesion. Other: None. CT MAXILLOFACIAL FINDINGS Osseous: No fracture or mandibular dislocation. No destructive process. Degenerative changes in the temporomandibular joints. Edentulous.  Orbits: Negative. No traumatic or inflammatory finding. Sinuses: Clear. Soft tissues: Negative. CT CERVICAL SPINE FINDINGS Alignment: Normal. Skull base and vertebrae: No acute fracture. No primary bone lesion or focal pathologic process. Soft tissues and spinal canal: No prevertebral fluid or swelling. No visible canal hematoma. Disc levels: Degenerative changes throughout the cervical spine with narrowed disc spaces and endplate osteophyte formation. Upper chest: Visualized lung apices  are clear. Other: None. IMPRESSION: 1. No acute intracranial abnormalities. Chronic atrophy and small vessel ischemic changes. 2. No acute displaced orbital or facial fractures identified. 3. Normal alignment of the cervical spine. Diffuse degenerative changes. No acute displaced fractures identified. Electronically Signed   By: Burman Nieves M.D.   On: 05/16/2023 18:39   DG Hand Complete Left  Result Date: 05/16/2023 CLINICAL DATA:  Fall, left hand pain EXAM: LEFT HAND - COMPLETE 3+ VIEW COMPARISON:  None Available. FINDINGS: There is an acute, transverse, extra-articular fracture of the proximal metadiaphysis of the left fourth proximal phalanx with fracture fragments in near anatomic alignment. No other fracture identified. No dislocation. Osseous structures are diffusely mildly osteopenic. Degenerative changes are seen most severely at the triscaphe joint and second DIP joint. Mild soft tissue swelling noted. IMPRESSION: 1. Acute, transverse, extra-articular fracture of the proximal metadiaphysis of the left fourth proximal phalanx. Electronically Signed   By: Helyn Numbers M.D.   On: 05/16/2023 18:30    Procedures Procedures    Medications Ordered in ED Medications - No data to display  ED Course/ Medical Decision Making/ A&P                                 Medical Decision Making Amount and/or Complexity of Data Reviewed Radiology: ordered.   Patient presents today with complaints of mechanical fall on Eliquis yesterday.  She is afebrile, nontoxic-appearing, and in no acute distress with reassuring vital signs.  She is also alert and oriented and neurologically intact without focal deficits.  CT imaging obtained out of the face, head, and neck which have resulted and reveal no acute findings.  I have personally reviewed and interpreted this imaging and agree with radiology interpretation.  X-ray imaging obtained of the left hand which has resulted and reveals acute transverse  extra-articular fracture of the proximal metadiaphysis of the left fourth proximal phalanx.  I have personally reviewed and interpreted this imaging and agree with radiology interpretation.  Patient informed of her finger fracture and placed in a static finger splint for management of same.  I offered pain medication which she declined.  Will give referral to hand surgery for follow-up. Evaluation and diagnostic testing in the emergency department does not suggest an emergent condition requiring admission or immediate intervention beyond what has been performed at this time.  Plan for discharge with close PCP follow-up.  Patient is understanding and amenable with plan, educated on red flag symptoms that would prompt immediate return.  Patient discharged in stable condition.   Final Clinical Impression(s) / ED Diagnoses Final diagnoses:  Fall, initial encounter  Nondisplaced fracture of proximal phalanx of left ring finger, initial encounter for closed fracture    Rx / DC Orders ED Discharge Orders     None     An After Visit Summary was printed and given to the patient.     Vear Clock 05/16/23 Georgia Lopes, MD 05/17/23 [phone]

## 2023-05-16 NOTE — ED Triage Notes (Signed)
Pt comes in today from a fall yesterday. Pt recently put on Eliquis for blood clots. Pt states when she fell yesterday she broke her fall with her hand and today has a bruise to left eye, and left hand is swollen.

## 2023-05-16 NOTE — Telephone Encounter (Signed)
Received call from [place], [place] with [facility] [phone]~ telephone.   Reports that patient had fall on 05/15/2023 and noted discoloration to L orbits and L side of body. States that patient has swelling to left arm, wrist, and hand. States that swelling is so bad that patient cannot make a fist or flex her digits. States that she advised patient to go to ER for evaluation, but patient declined. Advised patient to use ice on swelling.   Also requested verbal order for [facility] OT 1 week x2 for home safety education and fall prevention. VO given.   Call placed to Dr. Henreitta Leber who agrees with recommendation of ER evaluation for orbital discoloration and edema to hand.  Call placed to patient. Given provider recommendations. Agreeable to plan. States that she will go to ER for evaluation.

## 2023-05-16 NOTE — Telephone Encounter (Signed)
Pt states her legs are weeping, swelling, and wet even wit her compressions on. She denies any SOB and no fluid pill. Please advise.

## 2023-05-16 NOTE — Discharge Instructions (Addendum)
As we discussed, you have a broken left ring finger.  We have given you a splint to wear at all times for management of this.  You may take it off to shower but need to wear 24/7 otherwise.  I have given you a referral to a hand specialist for you to call to schedule an appointment for follow-up.  Please call at your earliest convenience to schedule an appointment.  Please follow up with your primary doctor as well.  Return if development of any new or worsening symptoms.

## 2023-05-17 MED ORDER — FUROSEMIDE 20 MG PO TABS: 10.0000 mg | ORAL_TABLET | Freq: Every day | ORAL | 1 refills | Status: DC

## 2023-05-17 NOTE — Telephone Encounter (Signed)
Spoke with EP she suggest we start Lasix 10 Mg while continuing to watch Pt BP no lower than 90/60 or we stop the lasix if no improvement after 1 week and BP is still average increase to 20 Mg daily Labs sent to be done 1 week and sooner appointment was made

## 2023-05-17 NOTE — Telephone Encounter (Signed)
Recent BP readings  96/66 118/62 93/62 104/65 112/76

## 2023-05-22 ENCOUNTER — Ambulatory Visit: Payer: Medicare Other | Admitting: Orthopaedic Surgery

## 2023-05-22 ENCOUNTER — Encounter: Payer: Self-pay | Admitting: Orthopaedic Surgery

## 2023-05-22 ENCOUNTER — Telehealth: Payer: Self-pay | Admitting: Cardiology

## 2023-05-22 ENCOUNTER — Ambulatory Visit (HOSPITAL_COMMUNITY): Payer: Medicare Other

## 2023-05-22 VITALS — BP 102/71 | HR 96 | Ht 64.0 in | Wt 133.0 lb

## 2023-05-22 DIAGNOSIS — S62645A Nondisplaced fracture of proximal phalanx of left ring finger, initial encounter for closed fracture: Secondary | ICD-10-CM | POA: Diagnosis not present

## 2023-05-22 NOTE — Telephone Encounter (Signed)
I apologize - that is a typo on my part.  She has been doing the 5mg  as prescribed - 3 tabs three x day (15mg  q 8 hr).

## 2023-05-22 NOTE — Progress Notes (Signed)
Subjective:    Patient ID: Kayla Dean, female    DOB: [birth year], 72 y.o.   MRN: [id number]  HPI She fell at home and hurt her left ring finger.  X-rays show nondisplaced fracture of the proximal phalanx of the ring finger on the left.  She has no other injury.  She went to the ER on the date of injury 05-16-23.  I have reviewed the records. She was placed in a splint.    I have independently reviewed and interpreted x-rays of this patient done at another site by another physician or qualified health professional.    Review of Systems  Constitutional:  Positive for activity change.  Cardiovascular:  Positive for palpitations.  Musculoskeletal:  Positive for arthralgias.  All other systems reviewed and are negative. For Review of Systems, all other systems reviewed and are negative.  The following is a summary of the past history medically, past history surgically, known current medicines, social history and family history.  This information is gathered electronically by the computer from prior information and documentation.  I review this each visit and have found including this information at this point in the chart is beneficial and informative.   Past Medical History:  Diagnosis Date   Aortic aneurysm (HCC)    Arthritis    H/O gastric bypass    Hiatal hernia    Hypothyroidism    Small bowel obstruction (HCC)     Past Surgical History:  Procedure Laterality Date   ABDOMINAL HYSTERECTOMY     CHOLECYSTECTOMY     COLON SURGERY     HERNIA REPAIR     LAPAROTOMY N/A 02/02/2023   Procedure: EXPLORATORY LAPAROTOMY, [name], EXPLANT OF ABDOMINAL MESH;  Surgeon: Lucretia Roers, MD;  Location: [facility];  Service: General;  Laterality: N/A;   REPLACEMENT TOTAL KNEE Right    ROTATOR CUFF REPAIR Right    TOTAL HIP ARTHROPLASTY Right     Current Outpatient Medications on File Prior to Visit  Medication Sig Dispense Refill   acetaminophen (TYLENOL) 500  MG tablet Take 1,000 mg by mouth every 8 (eight) hours as needed for moderate pain.     apixaban (ELIQUIS) 5 MG TABS tablet Take 1 tablet (5 mg total) by mouth 2 (two) times daily. 60 tablet 2   Calcium Carb-Cholecalciferol (CALCIUM 600 + D) 600-200 MG-UNIT TABS Take 1 tablet by mouth every morning.     Cholecalciferol (VITAMIN D-3 PO) Take 1 capsule by mouth daily.     ferrous sulfate 325 (65 FE) MG tablet Take 325 mg by mouth every morning.     furosemide (LASIX) 20 MG tablet Take 0.5 tablets (10 mg total) by mouth daily. 90 tablet 1   levothyroxine (SYNTHROID, LEVOTHROID) 137 MCG tablet Take 137 mcg by mouth daily before breakfast.     midodrine (PROAMATINE) 5 MG tablet Take 3 tablets (15 mg total) by mouth every 8 (eight) hours. 270 tablet 0   Multiple Vitamin (MULTIVITAMIN WITH MINERALS) TABS tablet Take 1 tablet by mouth every morning.     ondansetron (ZOFRAN-ODT) 4 MG disintegrating tablet Take 1 tablet (4 mg total) by mouth every 6 (six) hours as needed for nausea. 20 tablet 0   oxyCODONE (OXY IR/ROXICODONE) 5 MG immediate release tablet Take 1 tablet (5 mg total) by mouth every 4 (four) hours as needed for severe pain or breakthrough pain. 20 tablet 0   zolpidem (AMBIEN) 5 MG tablet Take 1 tablet (5 mg total)  by mouth at bedtime as needed for sleep. 30 tablet 0   hydrocortisone (CORTEF) 5 MG tablet 4 tablets daily for 1 day 3 tablets daily for 1 day 2 tablets daily for 1 day 1 tablet daily for 1 day 10 tablet 0   No current facility-administered medications on file prior to visit.    Social History   Socioeconomic History   Marital status: Widowed    Spouse name: Not on file   Number of children: Not on file   Years of education: Not on file   Highest education level: Not on file  Occupational History   Not on file  Tobacco Use   Smoking status: Never   Smokeless tobacco: Never  Vaping Use   Vaping status: Never Used  Substance and Sexual Activity   Alcohol use: Never    Drug use: Never   Sexual activity: Not on file  Other Topics Concern   Not on file  Social History Narrative   Not on file   Social Determinants of Health   Financial Resource Strain: Not on file  Food Insecurity: No Food Insecurity (04/13/2023)   Hunger Vital Sign    Worried About Running Out of Food in the Last Year: Never true    Ran Out of Food in the Last Year: Never true  Transportation Needs: No Transportation Needs (04/13/2023)   PRAPARE - Administrator, Civil Service (Medical): No    Lack of Transportation (Non-Medical): No  Physical Activity: Not on file  Stress: Not on file  Social Connections: Not on file  Intimate Partner Violence: Not At Risk (04/13/2023)   Humiliation, Afraid, Rape, and Kick questionnaire    Fear of Current or Ex-Partner: No    Emotionally Abused: No    Physically Abused: No    Sexually Abused: No    Family History  Problem Relation Age of Onset   CAD Father    CAD Brother     BP 102/71   Pulse 96   Ht 5\' 4"  (1.626 m)   Wt 133 lb (60.3 kg)   BMI 22.83 kg/m   Body mass index is 22.83 kg/m.      Objective:   Physical Exam Vitals and nursing note reviewed. Exam conducted with a chaperone present.  Constitutional:      Appearance: She is well-developed.  HENT:     Head: Normocephalic and atraumatic.  Eyes:     Conjunctiva/sclera: Conjunctivae normal.     Pupils: Pupils are equal, round, and reactive to light.  Cardiovascular:     Rate and Rhythm: Normal rate and regular rhythm.  Pulmonary:     Effort: Pulmonary effort is normal.  Abdominal:     Palpations: Abdomen is soft.  Musculoskeletal:       Hands:     Cervical back: Normal range of motion and neck supple.     Comments: Uses walker.  Skin:    General: Skin is warm and dry.  Neurological:     Mental Status: She is alert and oriented to person, place, and time.     Cranial Nerves: No cranial nerve deficit.     Motor: No abnormal muscle tone.      Coordination: Coordination normal.     Deep Tendon Reflexes: Reflexes are normal and symmetric. Reflexes normal.  Psychiatric:        Behavior: Behavior normal.        Thought Content: Thought content normal.  Judgment: Judgment normal.           Assessment & Plan:   Encounter Diagnosis  Name Primary?   Closed nondisplaced fracture of proximal phalanx of left ring finger, initial encounter Yes   I have applied a new dorsal aluminum splint and explained how to use.  I have given extra tape.  Return in three weeks.  X-rays on return.  Call if any problem.  Precautions discussed.  Electronically Signed Darreld Mclean, MD 8/6/20241:41 PM

## 2023-05-22 NOTE — Telephone Encounter (Signed)
Spoke with patient in regards to medications & BP's - states the Eliquis issue is taken care of and will pick up at her pharmacy.    Has not taken any Lasix at all yet due to not being able to pick up on Friday.  States she is currently taking her Midodrine 5mg  TID (6/12/6).  States her BP readings are always the lowest in the mornings.  No c/o chest pain, sob or dizziness.    AM BP readings - takes 6:00 am -   88/62 84/60 88/56  97/64  PM BP readings - takes around 4-5 pm -   101/66 100/68 101/66 100/65 99/62

## 2023-05-22 NOTE — Telephone Encounter (Signed)
Pt c/o medication issue:  1. Name of Medication: furosemide (LASIX) 20 MG tablet  apixaban (ELIQUIS) 5 MG TABS tablet  midodrine (PROAMATINE) 5 MG tablet    2. How are you currently taking this medication (dosage and times per day)? As prescribed   3. Are you having a reaction (difficulty breathing--STAT)?   4. What is your medication issue? Patient states that she needs further instructions for the lasix medication. She is also having a hard time picking up her Eliquis due to pharmacy stating it is not time for this to picked up, but states she is out of this medication soon. Please advise.

## 2023-05-23 ENCOUNTER — Emergency Department (HOSPITAL_COMMUNITY)
Admission: EM | Admit: 2023-05-23 | Discharge: 2023-05-24 | Disposition: A | Discharge status: Home / Self Care | Payer: Medicare Other | Attending: Emergency Medicine | Admitting: Emergency Medicine

## 2023-05-23 ENCOUNTER — Encounter (HOSPITAL_COMMUNITY): Payer: Self-pay | Admitting: *Deleted

## 2023-05-23 ENCOUNTER — Other Ambulatory Visit: Payer: Self-pay

## 2023-05-23 ENCOUNTER — Encounter: Payer: Self-pay | Admitting: *Deleted

## 2023-05-23 ENCOUNTER — Telehealth: Payer: Self-pay | Admitting: Cardiology

## 2023-05-23 DIAGNOSIS — I959 Hypotension, unspecified: Secondary | ICD-10-CM | POA: Diagnosis present

## 2023-05-23 DIAGNOSIS — Z7901 Long term (current) use of anticoagulants: Secondary | ICD-10-CM | POA: Insufficient documentation

## 2023-05-23 DIAGNOSIS — R600 Localized edema: Secondary | ICD-10-CM | POA: Insufficient documentation

## 2023-05-23 DIAGNOSIS — L0390 Cellulitis, unspecified: Secondary | ICD-10-CM

## 2023-05-23 DIAGNOSIS — N3000 Acute cystitis without hematuria: Secondary | ICD-10-CM

## 2023-05-23 LAB — CBC
HCT: 35.9 % — ABNORMAL LOW (ref 36.0–46.0)
Hemoglobin: 11.4 g/dL — ABNORMAL LOW (ref 12.0–15.0)
MCH: 32.8 pg (ref 26.0–34.0)
MCHC: 31.8 g/dL (ref 30.0–36.0)
MCV: 103.2 fL — ABNORMAL HIGH (ref 80.0–100.0)
Platelets: 339 10*3/uL (ref 150–400)
RBC: 3.48 MIL/uL — ABNORMAL LOW (ref 3.87–5.11)
RDW: 15.7 % — ABNORMAL HIGH (ref 11.5–15.5)
WBC: 10.2 10*3/uL (ref 4.0–10.5)
nRBC: 0 % (ref 0.0–0.2)

## 2023-05-23 LAB — URINALYSIS, ROUTINE W REFLEX MICROSCOPIC
Bilirubin Urine: NEGATIVE
Glucose, UA: NEGATIVE mg/dL
Hgb urine dipstick: NEGATIVE
Ketones, ur: 5 mg/dL — AB
Nitrite: POSITIVE — AB
Protein, ur: NEGATIVE mg/dL
Specific Gravity, Urine: 1.03 (ref 1.005–1.030)
pH: 5 (ref 5.0–8.0)

## 2023-05-23 LAB — COMPREHENSIVE METABOLIC PANEL
ALT: 18 U/L (ref 0–44)
AST: 17 U/L (ref 15–41)
Albumin: 1.7 g/dL — ABNORMAL LOW (ref 3.5–5.0)
Alkaline Phosphatase: 51 U/L (ref 38–126)
Anion gap: 6 (ref 5–15)
BUN: 21 mg/dL (ref 8–23)
CO2: 26 mmol/L (ref 22–32)
Calcium: 7.5 mg/dL — ABNORMAL LOW (ref 8.9–10.3)
Chloride: 104 mmol/L (ref 98–111)
Creatinine, Ser: 0.46 mg/dL (ref 0.44–1.00)
GFR, Estimated: >60 mL/min (ref >60)
Glucose, Bld: 94 mg/dL (ref 70–99)
Potassium: 3.5 mmol/L (ref 3.5–5.1)
Sodium: 136 mmol/L (ref 135–145)
Total Bilirubin: 0.5 mg/dL (ref 0.3–1.2)
Total Protein: 4.7 g/dL — ABNORMAL LOW (ref 6.5–8.1)

## 2023-05-23 LAB — LIPASE, BLOOD: Lipase: 23 U/L (ref 11–51)

## 2023-05-23 MED ORDER — ABDOMINAL BINDER/ELASTIC MED MISC: 1 refills | Status: AC

## 2023-05-23 NOTE — Telephone Encounter (Signed)
Notified via detailed voice message.  Also sent message via mychart.

## 2023-05-23 NOTE — ED Triage Notes (Addendum)
Pt seen by home health for PT who called the Philis Nettle, NP with cardiology re: R leg redness and hypotension and the pt reports being told to come to the ED for further eval, pt denies CP & SOB, A&O x4, upon assessment in triage the R anterior lower leg is oozing clear liquid with minimal redness noted, pt reports n/v/d with x 2 black stools today, pt reports vomiting x 2 today

## 2023-05-23 NOTE — Telephone Encounter (Signed)
Caller is following-up on early call regarding patients swelling in legs.

## 2023-05-23 NOTE — Telephone Encounter (Signed)
Patient notified and verbalized understanding.  Binder prescription sent to pharmacy now.

## 2023-05-23 NOTE — Telephone Encounter (Signed)
Pt is returning call.  

## 2023-05-23 NOTE — Telephone Encounter (Signed)
Pt c/o swelling/edema: STAT if pt has developed SOB within 24 hours  If swelling, where is the swelling located? Right lower extremity   How much weight have you gained and in what time span? No   Have you gained 2 pounds in a day or 5 pounds in a week? No   Do you have a log of your daily weights (if so, list)? No   Are you currently taking a fluid pill? No, was told not to take if BP is under 90/60  Are you currently SOB? No   Have you traveled recently in a car or plane for an extended period of time? No   Patient's lower right extremity is weeping and red.

## 2023-05-24 ENCOUNTER — Other Ambulatory Visit: Payer: Self-pay | Admitting: *Deleted

## 2023-05-24 ENCOUNTER — Encounter: Payer: Self-pay | Admitting: *Deleted

## 2023-05-24 MED ORDER — MIDODRINE HCL 5 MG PO TABS: 15.0000 mg | ORAL_TABLET | Freq: Three times a day (TID) | ORAL | 6 refills | Status: DC

## 2023-05-24 MED ORDER — CEPHALEXIN 500 MG PO CAPS
500.0000 mg | ORAL_CAPSULE | Freq: Once | ORAL | Status: AC
  Administered 2023-05-24: 500 mg via ORAL
  Filled 2023-05-24: qty 1

## 2023-05-24 MED ORDER — CEPHALEXIN 500 MG PO CAPS: 500.0000 mg | ORAL_CAPSULE | Freq: Three times a day (TID) | ORAL | 0 refills | Status: DC

## 2023-05-24 NOTE — Discharge Instructions (Addendum)
Begin taking Keflex as prescribed.  Follow-up with your primary doctor in the next few days, and return to the ER if symptoms worsen or change.

## 2023-05-24 NOTE — Telephone Encounter (Signed)
Already addressed- see my chart messages.

## 2023-05-24 NOTE — ED Provider Notes (Signed)
[facility] AT [facility] Provider Note   CSN: [id number] Arrival date & time: 05/23/23  1906     History  Chief Complaint  Patient presents with   Hypotension    Kayla Dean is a 72 y.o. female.  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  This was performed several months ago.  Patient developed hypotension during her postoperative course and spent time in the common ICU.  She is currently taking midodrine for blood pressure support.  Patient was seen by home health today who was concerned about an area on her right lower leg that was weeping clear fluid.  There was mild redness surrounding it and there was concern for cellulitis.  The nurse practitioner at her primary's office recommended patient have ED evaluation due to her having low blood pressure.  Patient denies to me she is having any discomfort.  He has no other symptoms.  The history is provided by the patient.       Home Medications Prior to Admission medications   Medication Sig Start Date End Date Taking? Authorizing Provider  Elastic Bandages & Supports (ABDOMINAL BINDER/ELASTIC MED) MISC Apply as directed for orthostatic hypotension 05/23/23   Sharlene Dory, NP  acetaminophen (TYLENOL) 500 MG tablet Take 1,000 mg by mouth every 8 (eight) hours as needed for moderate pain.    [provider]  apixaban (ELIQUIS) 5 MG TABS tablet Take 1 tablet (5 mg total) by mouth 2 (two) times daily. 05/11/23 08/09/23  Sharlene Dory, NP  Calcium Carb-Cholecalciferol (CALCIUM 600 + D) 600-200 MG-UNIT TABS Take 1 tablet by mouth every morning.    [provider]  Cholecalciferol (VITAMIN D-3 PO) Take 1 capsule by mouth daily.    [provider]  ferrous sulfate 325 (65 FE) MG tablet Take 325 mg by mouth every morning.    [provider]  furosemide (LASIX) 20 MG tablet Take 0.5 tablets (10 mg total) by  mouth daily. 05/17/23   Sharlene Dory, NP  hydrocortisone (CORTEF) 5 MG tablet 4 tablets daily for 1 day 3 tablets daily for 1 day 2 tablets daily for 1 day 1 tablet daily for 1 day 04/27/23   Dorcas Carrow, MD  levothyroxine (SYNTHROID, LEVOTHROID) 137 MCG tablet Take 137 mcg by mouth daily before breakfast.    [provider]  midodrine (PROAMATINE) 5 MG tablet Take 3 tablets (15 mg total) by mouth every 8 (eight) hours. 04/26/23 05/26/23  Dorcas Carrow, MD  Multiple Vitamin (MULTIVITAMIN WITH MINERALS) TABS tablet Take 1 tablet by mouth every morning.    [provider]  ondansetron (ZOFRAN-ODT) 4 MG disintegrating tablet Take 1 tablet (4 mg total) by mouth every 6 (six) hours as needed for nausea. 02/08/23   Lucretia Roers, MD  oxyCODONE (OXY IR/ROXICODONE) 5 MG immediate release tablet Take 1 tablet (5 mg total) by mouth every 4 (four) hours as needed for severe pain or breakthrough pain. 05/14/23   Lucretia Roers, MD  zolpidem (AMBIEN) 5 MG tablet Take 1 tablet (5 mg total) by mouth at bedtime as needed for sleep. 02/15/23   Lucretia Roers, MD      Allergies    Patient has no known allergies.    Review of Systems   Review of Systems  All other systems reviewed and are negative.   Physical Exam Updated Vital Signs BP 104/89 (BP Location: Right Arm)   Pulse 76  Temp 97.7 F (36.5 C) (Oral)   Resp 19   SpO2 98%  Physical Exam Vitals and nursing note reviewed.  Constitutional:      General: She is not in acute distress.    Appearance: She is well-developed. She is not diaphoretic.  HENT:     Head: Normocephalic and atraumatic.  Cardiovascular:     Rate and Rhythm: Normal rate and regular rhythm.     Heart sounds: No murmur heard.    No friction rub. No gallop.  Pulmonary:     Effort: Pulmonary effort is normal. No respiratory distress.     Breath sounds: Normal breath sounds. No wheezing.  Abdominal:     General: Bowel sounds are normal. There is  no distension.     Palpations: Abdomen is soft.     Tenderness: There is no abdominal tenderness.  Musculoskeletal:        General: Normal range of motion.     Cervical back: Normal range of motion and neck supple.     Comments: There is 1-2+ pitting edema of both lower extremities.  To the anterior aspect of the right lower leg, there is an area of mild erythema with clear fluid weeping.  DP pulses are palpable bilaterally.  Skin:    General: Skin is warm and dry.  Neurological:     General: No focal deficit present.     Mental Status: She is alert and oriented to person, place, and time.     ED Results / Procedures / Treatments   Labs (all labs ordered are listed, but only abnormal results are displayed) Labs Reviewed  COMPREHENSIVE METABOLIC PANEL - Abnormal; Notable for the following components:      Result Value   Calcium 7.5 (*)    Total Protein 4.7 (*)    Albumin 1.7 (*)    All other components within normal limits  CBC - Abnormal; Notable for the following components:   RBC 3.48 (*)    Hemoglobin 11.4 (*)    HCT 35.9 (*)    MCV 103.2 (*)    RDW 15.7 (*)    All other components within normal limits  URINALYSIS, ROUTINE W REFLEX MICROSCOPIC - Abnormal; Notable for the following components:   APPearance HAZY (*)    Ketones, ur 5 (*)    Nitrite POSITIVE (*)    Leukocytes,Ua SMALL (*)    Bacteria, UA MANY (*)    All other components within normal limits  LIPASE, BLOOD    EKG EKG Interpretation Date/Time:  Wednesday May 23 2023 19:32:09 EDT Ventricular Rate:  80 PR Interval:  156 QRS Duration:  80 QT Interval:  366 QTC Calculation: 422 R Axis:   -51  Text Interpretation: Normal sinus rhythm Low voltage QRS Left anterior fascicular block Abnormal ECG Confirmed by Geoffery Lyons ([id number]) on 05/24/2023 2:44:48 AM  Radiology No results found.  Procedures Procedures    Medications Ordered in ED Medications - No data to display  ED Course/ Medical Decision  Making/ A&P  Patient sent here by primary care provider for evaluation after physical therapy noted redness and weeping to her right lower leg during a routine home visit.  They were also concerned that her blood pressure was low, however her blood pressure has been low since she had emergency surgery several months ago.  Workup initiated here including CBC, CMP, and lipase, all of which were unremarkable.  Urinalysis suggestive of UTI.  At this point, in review of the records, I  do not see where her condition has significantly changed enough to require admission.  Her blood pressure while I was speaking with her was 104/89.  Patient is without symptom other than weeping from her right leg.  There was mild erythema that could potentially be the beginning of cellulitis.  She also has findings in her urinalysis suggestive of UTI.  I feel as though Keflex would be a good choice of an antibiotic as it would cover both.  But ultimately, I do not see an indication for admission.  I will treat with antibiotics and have patient follow-up with primary.  Final Clinical Impression(s) / ED Diagnoses Final diagnoses:  None    Rx / DC Orders ED Discharge Orders     None         Geoffery Lyons, MD 05/24/23 [id number]

## 2023-05-25 ENCOUNTER — Telehealth: Payer: Self-pay

## 2023-05-25 NOTE — Transitions of Care (Post Inpatient/ED Visit) (Signed)
   05/25/2023  Name: NGINA COTNOIR MRN: [id number] DOB: 1951/08/06  Today's TOC FU Call Status: Today's TOC FU Call Status:: Unsuccessful Call (1st Attempt) Unsuccessful Call (1st Attempt) Date: 05/25/23  Attempted to reach the patient regarding the most recent Inpatient/ED visit.  Follow Up Plan: Additional outreach attempts will be made to reach the patient to complete the Transitions of Care (Post Inpatient/ED visit) call.   Signature tb,cma

## 2023-05-26 DIAGNOSIS — I2699 Other pulmonary embolism without acute cor pulmonale: Secondary | ICD-10-CM | POA: Insufficient documentation

## 2023-05-28 ENCOUNTER — Encounter: Payer: Self-pay | Admitting: Oncology

## 2023-05-28 ENCOUNTER — Inpatient Hospital Stay: Payer: Medicare Other | Admitting: Oncology

## 2023-05-28 VITALS — BP 93/67 | HR 90 | Temp 97.8°F | Resp 18 | Ht 62.21 in | Wt 136.4 lb

## 2023-05-28 DIAGNOSIS — W19XXXS Unspecified fall, sequela: Secondary | ICD-10-CM

## 2023-05-28 DIAGNOSIS — Z9181 History of falling: Secondary | ICD-10-CM | POA: Insufficient documentation

## 2023-05-28 DIAGNOSIS — I82451 Acute embolism and thrombosis of right peroneal vein: Secondary | ICD-10-CM | POA: Insufficient documentation

## 2023-05-28 DIAGNOSIS — D649 Anemia, unspecified: Secondary | ICD-10-CM | POA: Diagnosis not present

## 2023-05-28 DIAGNOSIS — I2693 Single subsegmental pulmonary embolism without acute cor pulmonale: Secondary | ICD-10-CM | POA: Insufficient documentation

## 2023-05-28 DIAGNOSIS — R296 Repeated falls: Secondary | ICD-10-CM | POA: Insufficient documentation

## 2023-05-28 DIAGNOSIS — M7989 Other specified soft tissue disorders: Secondary | ICD-10-CM | POA: Diagnosis not present

## 2023-05-28 DIAGNOSIS — Z7901 Long term (current) use of anticoagulants: Secondary | ICD-10-CM | POA: Diagnosis not present

## 2023-05-28 DIAGNOSIS — E8809 Other disorders of plasma-protein metabolism, not elsewhere classified: Secondary | ICD-10-CM | POA: Insufficient documentation

## 2023-05-28 DIAGNOSIS — W19XXXA Unspecified fall, initial encounter: Secondary | ICD-10-CM | POA: Insufficient documentation

## 2023-05-28 NOTE — Patient Instructions (Addendum)
Continue Eliquis for 2 more months Fall precautions Call if you fall Stop taking Iron pills  What is a pulmonary embolism? A pulmonary embolism ("PE") is a blockage in 1 or more of the blood vessels that supply blood to the lungs. Most often, these blockages are caused by blood clots that form somewhere else and then travel to the lungs. In rare cases, blockages can also be caused by air bubbles, tiny globs of fat, or pieces of tumor that travel to the lungs.  Why are blood clots dangerous? If a blood clot forms or gets stuck inside a blood vessel, it can clog the vessel and keep blood from getting where it needs to go. When that happens in the lungs, the lungs can get damaged. Having blocked arteries in the lung can also make it hard to breathe and can even lead to death.  Most blood clots in the lungs actually form in the legs or pelvic area and then travel to the lungs. When a clot forms in the deep veins of the leg, it is called "deep vein thrombosis" ("DVT") (figure 1). DVT can cause swelling, pain, warmth, and redness in the leg. If you have any of these symptoms, it's important to see a doctor right away. They can do tests to find out if you do have a clot and where it is. There are treatments that can help prevent a clot from getting bigger and traveling to the lungs.  What are the symptoms of blood clots in the lungs? Common symptoms include:  ?Panting, shortness of breath, or trouble breathing  ?Sharp, knife-like chest pain when you breathe in or strain  ?Coughing or coughing up blood  ?Rapid heartbeat  If you get any of these symptoms, especially if they happen over a short period of time (hours or days) or get worse quickly, call for an ambulance (in the [place] and [place], call 9-1-1). At the hospital, doctors can do tests to find out if you do have a clot. Blood clots in the lungs can lead to death. That's why it's important to act fast and find out if there is a clot.  Is there a  test for PE? Yes. There are several tests that doctors can use to find out if a person has a blood clot in a lung. The most common tests include:  ?D-dimer blood test - D-dimer is a substance in the blood. The amount of D-dimer often goes up in people with a blood clot in a lung. This blood test is often done together with other tests.  ?CT pulmonary angiography ("CT-PA") - A CT pulmonary angiogram is a special kind of X-ray that uses computers. During this test, a dye is injected into 1 of your veins. The dye shows up on X-rays and can show if any blood vessels are blocked.  ?Ventilation/perfusion lung scan ("V/Q scan") - For this test, you breathe in a small amount of a radioactive substance. You also have a radioactive dye injected into 1 of your veins. Then, doctors look at how the different substances look on the scan. The scan can show if 1 of the arteries in the lung is blocked.  Less commonly, other tests are done, such as:  ?Pulmonary angiography - For this test, you have a small tube called a catheter inserted into 1 of the large veins in your body, usually in your leg. Then, doctors gently push this tube up into your chest to where the major blood vessels of  the lung are found. When the tube is in place, the doctors inject a dye that shows up on an X-ray.  ?MRI pulmonary angiography ("MRI-PA") - An MRI is an imaging test that uses a magnet to create pictures. During this test, a dye is injected into 1 of your veins. The dye shows up on X-rays and can show if any blood vessels are blocked.  How is a PE treated? Blood clots in the lungs are treated with medicines that keep clots from getting bigger. These medicines are called "anticoagulants." They are sometimes also called "blood thinners," although they do not actually thin the blood. Some come in shots, and others come in pills. PE is usually treated first in the hospital.  ?If you have had a clot, your doctor will prescribe an  anticoagulant medicine to lower your risk of getting more clots in the future. You will need to take the medicine for at least 3 months (and sometimes longer). Some people are first given a medicine that comes as a shot, called heparin. You might get this shot for a few days, or longer if for some reason you can't take pills.  ?The medicines do not dissolve existing blood clots, but they do keep them from getting bigger. They also help keep new blood clots from forming. Taking the medicine for a few months is important because it gives your body time to dissolve the old clot. It's also important because people who have a clot are at risk of developing another clot, especially in the first few months.  ?There are different oral medicines (pills) used to prevent and treat blood clots. They include apixaban (brand name: Eliquis), dabigatran (brand name: Pradaxa), edoxaban (brand names: Argie Ramming), rivaroxaban (brand name: Xarelto), and warfarin (brand name: Jantoven). Each medicine is different in the dose, how often you take it, the cost, and how your diet or other medicines might affect it (table 1). Your doctor can talk to you about your options and preferences.  If your doctor prescribes 1 of these medicines:  ?Take it exactly as your doctor tells you to - If you forget or miss a dose, call your doctor to find out what to do. When you start taking the medicine, you will need to have your blood tested. If you take warfarin, you will need regular blood tests to check how your blood is clotting. This is important to make sure that you get the correct dose of warfarin for you.  ?Follow your doctor's instructions about diet and medicines - Depending on which medicine you take, you might need to pay special attention to what you eat. Also, certain other medicines can affect the way these medicines work.  ?Watch for signs of bleeding - Abnormal bleeding is a risk with any of the medicines used to  prevent and treat blood clots. That's because while these medicines help prevent dangerous blood clots, they also make it harder for your body to control bleeding after an injury. Try to avoid getting injured, and tell your doctor right away if you do have signs of bleeding.  People who cannot take medicines to prevent and treat clots, or who do not get enough benefit from the medicines, can get a different treatment. This is called an "inferior vena cava filter" ("IVC filter"). The inferior vena cava is the large vein that carries blood from your legs and the lower half of your body back up to your heart. IVC filters go inside the inferior vena cava.  They filter and trap any large clots that form below the location of the filter. Your doctor might suggest this if:  ?You cannot safely take a medicine for blood clots.  ?You form clots even while taking a medicine for blood clots.  ?You have a dangerous bleeding problem while taking a medicine for blood clots.  ?Your lungs and heart might not be able to handle another PE.  In some cases, a severe clot can cause low blood pressure and even shock. (Shock is when blood pressure gets too low, and not enough blood can get to the body's organs and tissues.) If this happens, doctors can give medicine to dissolve the clot. This is sometimes called "clot-busting" medicine, and is given through a catheter (a small tube inserted into the vein). In some cases, doctors will do surgery to remove the clot.  Can I do anything on my own to prevent blood clots? Yes. People sometimes form clots because they have been sitting still for too long. People who travel on long airplane flights, for example, are at increased risk of blood clots. Some things that you can do to help prevent a clot during a long flight include:  ?Stand up and walk around at least once every hour.  ?Do not smoke just before your trip.  ?Wear loose, comfortable clothes.  ?Shift your position  while seated, and move your legs and feet often.  ?Wear knee-high compression stockings.  ?Avoid alcohol and medicines that make you sleepy, because they can impair your ability to move around.

## 2023-05-28 NOTE — Assessment & Plan Note (Signed)
Patient has mild macrocytic anemia with a hemoglobin of 11.4 It has significantly improved from before No signs of acute blood loss Continue to monitor for now Patient has normal iron and ferritin levels.  Discontinue p.o. iron

## 2023-05-28 NOTE — Assessment & Plan Note (Signed)
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

## 2023-05-28 NOTE — Progress Notes (Signed)
[facility] at [facility] HEMATOLOGY NEW VISIT  Billie Lade, MD  REASON FOR REFERRAL: Pulmonary Embolism  SUMMARY OF HEMATOLOGIC HISTORY: PE 1st episode: 04/13/23 Location: right interlobar pulmonary artery and proximal segmental and subsegmental pulmonary arteries. Risk factors: post surgery, acute illness-sepsis and ICU bound Rx: Eliquis since 04/26/23  DVT 1st episode: 04/15/23 Location: Right peroneal vein Risk factors: post surgery, acute illness-sepsis and IOCU bound Rx: Eliquis since 04/26/23   HISTORY OF PRESENT ILLNESS: Kayla Dean 72 y.o. female referred for Pulmonary embolism and DVT.  Patient is accompanied by her daughter today.  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  Patient was recently admitted to ICU on 04/12/2023 with sepsis and hypotension.  During this time she was diagnosed of acute PE and DVT and was started on heparin drip and transition to Eliquis on discharge.  Since discharge patient had 2 episodes of fall one of them requiring an ER visit with a finger fracture.  Both of these are due to loss of balance, patient did not lose consciousness. Patient reports feeling tired all the time since discharge and is working with physical therapy to get stronger.  She has no other complaints.  She is in a wheelchair today and stated that since the last hospital admission her physical status has been declined a lot.  Patient denies past history of thrombus, family history of thrombosis and any blood condition.  She is working on gaining weight and eating protein rich food.  We discussed in detail the risk of using Eliquis in the setting of falls. Patient has a provoked clot and would need anticoagulation for 3 to 6 months.  Considering how fragile she is I would only recommend anticoagulation for 3 months and rediscuss at 3 months if you want  to continue or hold or do a prophylactic dose.  I also recommended her to stop using Eliquis if she falls again.   I have reviewed the past medical history, past surgical history, social history and family history with the patient   ALLERGIES:  has No Known Allergies.  MEDICATIONS:  Current Outpatient Medications  Medication Sig Dispense Refill   acetaminophen (TYLENOL) 500 MG tablet Take 1,000 mg by mouth every 8 (eight) hours as needed for moderate pain.     apixaban (ELIQUIS) 5 MG TABS tablet Take 1 tablet (5 mg total) by mouth 2 (two) times daily. 60 tablet 2   Calcium Carb-Cholecalciferol (CALCIUM 600 + D) 600-200 MG-UNIT TABS Take 1 tablet by mouth every morning.     cephALEXin (KEFLEX) 500 MG capsule Take 1 capsule (500 mg total) by mouth 3 (three) times daily. 21 capsule 0   Cholecalciferol (VITAMIN D-3 PO) Take 1 capsule by mouth daily.     Elastic Bandages & Supports (ABDOMINAL BINDER/ELASTIC MED) MISC Apply as directed for orthostatic hypotension 1 each 1   ferrous sulfate 325 (65 FE) MG tablet Take 325 mg by mouth every morning.     levothyroxine (SYNTHROID, LEVOTHROID) 137 MCG tablet Take 137 mcg by mouth daily before breakfast.     midodrine (PROAMATINE) 5 MG tablet Take 3 tablets (15 mg total) by mouth every 8 (eight) hours. 270 tablet 6   Multiple Vitamin (MULTIVITAMIN WITH MINERALS) TABS tablet Take 1 tablet by mouth every morning.     ondansetron (ZOFRAN-ODT) 4 MG disintegrating tablet Take 1 tablet (4 mg total) by mouth every 6 (  six) hours as needed for nausea. 20 tablet 0   oxyCODONE (OXY IR/ROXICODONE) 5 MG immediate release tablet Take 1 tablet (5 mg total) by mouth every 4 (four) hours as needed for severe pain or breakthrough pain. 20 tablet 0   zolpidem (AMBIEN) 5 MG tablet Take 1 tablet (5 mg total) by mouth at bedtime as needed for sleep. 30 tablet 0   No current facility-administered medications for this visit.     REVIEW OF SYSTEMS:   Constitutional: Denies  fevers, chills or night sweats Eyes: Denies blurriness of vision Ears, nose, mouth, throat, and face: Denies mucositis or sore throat Respiratory: Denies cough, dyspnea or wheezes Cardiovascular: Denies palpitation, chest discomfort or lower extremity swelling Gastrointestinal:  Denies nausea, heartburn or change in bowel habits Skin: Denies abnormal skin rashes Lymphatics: Denies new lymphadenopathy or easy bruising Neurological:Denies numbness, tingling or new weaknesses Behavioral/Psych: Mood is stable, no new changes  All other systems were reviewed with the patient and are negative.  PHYSICAL EXAMINATION:   Vitals:   05/28/23 1403  BP: 93/67  Pulse: 90  Resp: 18  Temp: 97.8 F (36.6 C)  SpO2: 96%    GENERAL:alert, no distress and comfortable, in a wheel chair LUNGS: clear to auscultation and percussion with normal breathing effort HEART: regular rate & rhythm and no murmurs and significant bilateral pedal edema extending up to the knees and cellulitis in the right leg ABDOMEN:abdomen soft, non-tender and normal bowel sounds Musculoskeletal:no cyanosis of digits and no clubbing  NEURO: alert & oriented x 3 with fluent speech, no focal motor/sensory deficits  LABORATORY DATA:  I have reviewed the data as listed  Lab Results  Component Value Date   WBC 10.2 05/23/2023   NEUTROABS 10.1 (H) 03/09/2023   HGB 11.4 (L) 05/23/2023   HCT 35.9 (L) 05/23/2023   MCV 103.2 (H) 05/23/2023   PLT 339 05/23/2023      Component Value Date/Time   NA 136 05/23/2023 2000   K 3.5 05/23/2023 2000   CL 104 05/23/2023 2000   CO2 26 05/23/2023 2000   GLUCOSE 94 05/23/2023 2000   BUN 21 05/23/2023 2000   CREATININE 0.46 05/23/2023 2000   CREATININE 0.56 (L) 02/15/2023 1556   CALCIUM 7.5 (L) 05/23/2023 2000   PROT 4.7 (L) 05/23/2023 2000   ALBUMIN 1.7 (L) 05/23/2023 2000   AST 17 05/23/2023 2000   ALT 18 05/23/2023 2000   ALKPHOS 51 05/23/2023 2000   BILITOT 0.5 05/23/2023 2000    GFRNONAA >60 05/23/2023 2000   GFRAA >60 08/14/2018 0525       Chemistry      Component Value Date/Time   NA 136 05/23/2023 2000   K 3.5 05/23/2023 2000   CL 104 05/23/2023 2000   CO2 26 05/23/2023 2000   BUN 21 05/23/2023 2000   CREATININE 0.46 05/23/2023 2000   CREATININE 0.56 (L) 02/15/2023 1556      Component Value Date/Time   CALCIUM 7.5 (L) 05/23/2023 2000   ALKPHOS 51 05/23/2023 2000   AST 17 05/23/2023 2000   ALT 18 05/23/2023 2000   BILITOT 0.5 05/23/2023 2000       RADIOGRAPHIC STUDIES: CT Ap: 04/13/23: IMPRESSION: 1. Pulmonary embolus of the right interlobar pulmonary artery and proximal segmental and subsegmental pulmonary arteries. 2. Multiple thick-walled loops of small bowel between two anastomotic suture lines with associated mesenteric edema and prominent mesenteric lymph nodes, findings are similar to prior exam and consistent with history of chronic mesenteric ischemia. 3.  Trace left-greater-than-right pleural effusions and bibasilar atelectasis. 4. Aortic Atherosclerosis (ICD10-I70.0).  Lower venous DVT study: 04/20/2023: Right: Findings consistent with acute deep vein thrombosis involving the right peroneal veins.  No cystic structure found in the popliteal fossa Left: There is no evidence of DVT in the lower extremity.  No cystic structure found in the popliteal fossa  ASSESSMENT & PLAN:   Patient is a 72 year old female with past medical history of hypertension, recently had multiple hospital visits for ischemic bowel and its complications.  She was diagnosed of acute PE and DVT during a long ICU admission and was referred for the same.  Pulmonary embolism (HCC) Pulmonary embolism likely secondary to acute illness and post surgery (Provoked)- Ct from 04/13/23 Continue Eliquis for now For provoked clot: Would recommend anticoagulation for 3-6 months, but with patient having high risk of falls, would do 3 months and reconsider at 3 month mark Patient  has enough Eliquis to last until next visit RTC in 3 months for re-assessment  Acute deep vein thrombosis (DVT) of right peroneal vein (HCC) Peroneal DVT found on ultrasound done on 04/20/2023 plan  Patient continues to have bilateral leg swelling likely unrelated to DVT  Continue Eliquis as discussed above   Anemia Patient has mild macrocytic anemia with a hemoglobin of 11.4 It has significantly improved from before No signs of acute blood loss Continue to monitor for now Patient has normal iron and ferritin levels.  Discontinue p.o. iron   Hypoalbuminemia Albumin is 1.9 Discussed protein intake and supplementing with protein shakes  Falls Patient reported falling twice since discharge from the hospital Both of them due to loss of balance Discussed risks vs benefits of using Eliquis with falls Advised patient to stop Eliquis if she falls again   No orders of the defined types were placed in this encounter.   The total time spent in the appointment was 60 minutes encounter with patients including review of chart and various tests results, discussions about plan of care and coordination of care plan and documentation   All questions were answered. The patient knows to call the clinic with any problems, questions or concerns. No barriers to learning was detected.   Cindie Crumbly, MD 8/12/20244:55 PM

## 2023-05-28 NOTE — Assessment & Plan Note (Signed)
Patient reported falling twice since discharge from the hospital Both of them due to loss of balance Discussed risks vs benefits of using Eliquis with falls Advised patient to stop Eliquis if she falls again

## 2023-05-28 NOTE — Assessment & Plan Note (Signed)
Albumin is 1.9 Discussed protein intake and supplementing with protein shakes

## 2023-05-28 NOTE — Assessment & Plan Note (Addendum)
Pulmonary embolism likely secondary to acute illness and post surgery (Provoked)- Ct from 04/13/23 Continue Eliquis for now For provoked clot: Would recommend anticoagulation for 3-6 months, but with patient having high risk of falls, would do 3 months and reconsider at 3 month mark Patient has enough Eliquis to last until next visit RTC in 3 months for re-assessment

## 2023-05-30 ENCOUNTER — Other Ambulatory Visit: Payer: Self-pay | Admitting: Oncology

## 2023-05-30 DIAGNOSIS — D649 Anemia, unspecified: Secondary | ICD-10-CM

## 2023-06-06 ENCOUNTER — Other Ambulatory Visit: Payer: Self-pay | Admitting: *Deleted

## 2023-06-06 MED ORDER — FLUDROCORTISONE ACETATE 0.1 MG PO TABS: 0.1000 mg | ORAL_TABLET | Freq: Every day | ORAL | 6 refills | Status: DC

## 2023-06-12 ENCOUNTER — Ambulatory Visit: Payer: Medicare Other | Admitting: Orthopaedic Surgery

## 2023-06-12 ENCOUNTER — Encounter: Payer: Self-pay | Admitting: Orthopaedic Surgery

## 2023-06-12 ENCOUNTER — Other Ambulatory Visit (INDEPENDENT_AMBULATORY_CARE_PROVIDER_SITE_OTHER): Payer: Medicare Other

## 2023-06-12 DIAGNOSIS — S62645D Nondisplaced fracture of proximal phalanx of left ring finger, subsequent encounter for fracture with routine healing: Secondary | ICD-10-CM

## 2023-06-12 DIAGNOSIS — S62645A Nondisplaced fracture of proximal phalanx of left ring finger, initial encounter for closed fracture: Secondary | ICD-10-CM | POA: Diagnosis not present

## 2023-06-12 NOTE — Progress Notes (Signed)
I am better.  Her left ring finger is doing well.  She has been using the dorsal aluminum splint.  She has no new problem.  X-rays were done of the left ring finger, reported separately.  The left ring finger NV intact. ROM is limited.   Encounter Diagnosis  Name Primary?   Closed nondisplaced fracture of proximal phalanx of left ring finger with routine healing, subsequent encounter Yes   I told her to come out of splint and do gentle exercises of the finger.  Return in three weeks. X-rays then.  Call if any problem.  Precautions discussed.  Electronically Signed Darreld Mclean, MD 8/27/20242:08 PM

## 2023-06-15 ENCOUNTER — Other Ambulatory Visit: Payer: Self-pay

## 2023-06-15 ENCOUNTER — Ambulatory Visit: Payer: Medicare Other | Admitting: Internal Medicine

## 2023-06-15 DIAGNOSIS — Z79899 Other long term (current) drug therapy: Secondary | ICD-10-CM

## 2023-06-15 MED ORDER — FUROSEMIDE 20 MG PO TABS: 20.0000 mg | ORAL_TABLET | Freq: Every day | ORAL | 3 refills | Status: DC

## 2023-06-19 ENCOUNTER — Other Ambulatory Visit (HOSPITAL_COMMUNITY)
Admission: RE | Admit: 2023-06-19 | Discharge: 2023-06-19 | Disposition: A | Discharge status: Home / Self Care | Payer: Medicare Other | Source: Ambulatory Visit | Attending: Nurse Practitioner | Admitting: Nurse Practitioner

## 2023-06-19 DIAGNOSIS — Z79899 Other long term (current) drug therapy: Secondary | ICD-10-CM | POA: Insufficient documentation

## 2023-06-19 LAB — BASIC METABOLIC PANEL
Anion gap: 7 (ref 5–15)
BUN: 13 mg/dL (ref 8–23)
CO2: 27 mmol/L (ref 22–32)
Calcium: 6.9 mg/dL — ABNORMAL LOW (ref 8.9–10.3)
Chloride: 99 mmol/L (ref 98–111)
Creatinine, Ser: 0.6 mg/dL (ref 0.44–1.00)
GFR, Estimated: >60 mL/min (ref >60)
Glucose, Bld: 106 mg/dL — ABNORMAL HIGH (ref 70–99)
Potassium: 4.2 mmol/L (ref 3.5–5.1)
Sodium: 133 mmol/L — ABNORMAL LOW (ref 135–145)

## 2023-06-25 ENCOUNTER — Other Ambulatory Visit: Payer: Self-pay

## 2023-06-25 ENCOUNTER — Inpatient Hospital Stay (HOSPITAL_COMMUNITY)
Admission: EM | Admit: 2023-06-25 | Discharge: 2023-06-28 | DRG: 871 | Disposition: A | Discharge status: Home Health | Payer: Medicare Other | Attending: Family Medicine | Admitting: Family Medicine

## 2023-06-25 ENCOUNTER — Encounter (HOSPITAL_COMMUNITY): Payer: Self-pay | Admitting: Emergency Medicine

## 2023-06-25 ENCOUNTER — Emergency Department (HOSPITAL_COMMUNITY): Payer: Medicare Other

## 2023-06-25 DIAGNOSIS — Z8249 Family history of ischemic heart disease and other diseases of the circulatory system: Secondary | ICD-10-CM | POA: Diagnosis not present

## 2023-06-25 DIAGNOSIS — Z7901 Long term (current) use of anticoagulants: Secondary | ICD-10-CM | POA: Diagnosis not present

## 2023-06-25 DIAGNOSIS — E46 Unspecified protein-calorie malnutrition: Secondary | ICD-10-CM | POA: Diagnosis not present

## 2023-06-25 DIAGNOSIS — E039 Hypothyroidism, unspecified: Secondary | ICD-10-CM | POA: Diagnosis present

## 2023-06-25 DIAGNOSIS — Z9884 Bariatric surgery status: Secondary | ICD-10-CM

## 2023-06-25 DIAGNOSIS — E871 Hypo-osmolality and hyponatremia: Secondary | ICD-10-CM | POA: Diagnosis not present

## 2023-06-25 DIAGNOSIS — I959 Hypotension, unspecified: Secondary | ICD-10-CM | POA: Diagnosis present

## 2023-06-25 DIAGNOSIS — R6520 Severe sepsis without septic shock: Secondary | ICD-10-CM

## 2023-06-25 DIAGNOSIS — Z6830 Body mass index (BMI) 30.0-30.9, adult: Secondary | ICD-10-CM

## 2023-06-25 DIAGNOSIS — E43 Unspecified severe protein-calorie malnutrition: Secondary | ICD-10-CM | POA: Diagnosis present

## 2023-06-25 DIAGNOSIS — E876 Hypokalemia: Secondary | ICD-10-CM | POA: Diagnosis present

## 2023-06-25 DIAGNOSIS — Z79899 Other long term (current) drug therapy: Secondary | ICD-10-CM

## 2023-06-25 DIAGNOSIS — I2693 Single subsegmental pulmonary embolism without acute cor pulmonale: Secondary | ICD-10-CM | POA: Diagnosis not present

## 2023-06-25 DIAGNOSIS — I878 Other specified disorders of veins: Secondary | ICD-10-CM | POA: Diagnosis present

## 2023-06-25 DIAGNOSIS — Z86718 Personal history of other venous thrombosis and embolism: Secondary | ICD-10-CM | POA: Diagnosis not present

## 2023-06-25 DIAGNOSIS — E8809 Other disorders of plasma-protein metabolism, not elsewhere classified: Secondary | ICD-10-CM | POA: Diagnosis not present

## 2023-06-25 DIAGNOSIS — A419 Sepsis, unspecified organism: Principal | ICD-10-CM | POA: Diagnosis present

## 2023-06-25 DIAGNOSIS — Z79890 Hormone replacement therapy (postmenopausal): Secondary | ICD-10-CM

## 2023-06-25 DIAGNOSIS — I9589 Other hypotension: Secondary | ICD-10-CM | POA: Diagnosis present

## 2023-06-25 DIAGNOSIS — R109 Unspecified abdominal pain: Secondary | ICD-10-CM | POA: Diagnosis present

## 2023-06-25 DIAGNOSIS — Z96641 Presence of right artificial hip joint: Secondary | ICD-10-CM | POA: Diagnosis present

## 2023-06-25 DIAGNOSIS — Z86711 Personal history of pulmonary embolism: Secondary | ICD-10-CM

## 2023-06-25 DIAGNOSIS — Z96651 Presence of right artificial knee joint: Secondary | ICD-10-CM | POA: Diagnosis present

## 2023-06-25 DIAGNOSIS — R600 Localized edema: Secondary | ICD-10-CM | POA: Diagnosis present

## 2023-06-25 DIAGNOSIS — L03114 Cellulitis of left upper limb: Secondary | ICD-10-CM | POA: Diagnosis present

## 2023-06-25 DIAGNOSIS — K219 Gastro-esophageal reflux disease without esophagitis: Secondary | ICD-10-CM | POA: Diagnosis present

## 2023-06-25 DIAGNOSIS — Z7952 Long term (current) use of systemic steroids: Secondary | ICD-10-CM | POA: Diagnosis not present

## 2023-06-25 DIAGNOSIS — D539 Nutritional anemia, unspecified: Secondary | ICD-10-CM | POA: Diagnosis present

## 2023-06-25 DIAGNOSIS — E669 Obesity, unspecified: Secondary | ICD-10-CM | POA: Diagnosis present

## 2023-06-25 DIAGNOSIS — R6521 Severe sepsis with septic shock: Secondary | ICD-10-CM | POA: Diagnosis present

## 2023-06-25 DIAGNOSIS — I2699 Other pulmonary embolism without acute cor pulmonale: Secondary | ICD-10-CM | POA: Diagnosis present

## 2023-06-25 DIAGNOSIS — D649 Anemia, unspecified: Secondary | ICD-10-CM | POA: Diagnosis present

## 2023-06-25 LAB — CBC WITH DIFFERENTIAL/PLATELET
Abs Immature Granulocytes: 0.2 10*3/uL — ABNORMAL HIGH (ref 0.00–0.07)
Basophils Absolute: 0.1 10*3/uL (ref 0.0–0.1)
Basophils Relative: 0 %
Eosinophils Absolute: 0 10*3/uL (ref 0.0–0.5)
Eosinophils Relative: 0 %
HCT: 36.1 % (ref 36.0–46.0)
Hemoglobin: 11.6 g/dL — ABNORMAL LOW (ref 12.0–15.0)
Immature Granulocytes: 1 %
Lymphocytes Relative: 8 %
Lymphs Abs: 1.9 10*3/uL (ref 0.7–4.0)
MCH: 32 pg (ref 26.0–34.0)
MCHC: 32.1 g/dL (ref 30.0–36.0)
MCV: 99.7 fL (ref 80.0–100.0)
Monocytes Absolute: 0.9 10*3/uL (ref 0.1–1.0)
Monocytes Relative: 4 %
Neutro Abs: 19.9 10*3/uL — ABNORMAL HIGH (ref 1.7–7.7)
Neutrophils Relative %: 87 %
Platelets: 225 10*3/uL (ref 150–400)
RBC: 3.62 MIL/uL — ABNORMAL LOW (ref 3.87–5.11)
RDW: 14.2 % (ref 11.5–15.5)
WBC: 22.9 10*3/uL — ABNORMAL HIGH (ref 4.0–10.5)
nRBC: 0 % (ref 0.0–0.2)

## 2023-06-25 LAB — URINALYSIS, W/ REFLEX TO CULTURE (INFECTION SUSPECTED)
Bilirubin Urine: NEGATIVE
Glucose, UA: NEGATIVE mg/dL
Hgb urine dipstick: NEGATIVE
Ketones, ur: NEGATIVE mg/dL
Nitrite: POSITIVE — AB
Protein, ur: NEGATIVE mg/dL
Specific Gravity, Urine: 1.01 (ref 1.005–1.030)
pH: 6 (ref 5.0–8.0)

## 2023-06-25 LAB — COMPREHENSIVE METABOLIC PANEL
ALT: 22 U/L (ref 0–44)
AST: 18 U/L (ref 15–41)
Albumin: <1.5 g/dL — ABNORMAL LOW (ref 3.5–5.0)
Alkaline Phosphatase: 96 U/L (ref 38–126)
Anion gap: 8 (ref 5–15)
BUN: 14 mg/dL (ref 8–23)
CO2: 25 mmol/L (ref 22–32)
Calcium: 6.6 mg/dL — ABNORMAL LOW (ref 8.9–10.3)
Chloride: 98 mmol/L (ref 98–111)
Creatinine, Ser: 0.61 mg/dL (ref 0.44–1.00)
GFR, Estimated: >60 mL/min (ref >60)
Glucose, Bld: 125 mg/dL — ABNORMAL HIGH (ref 70–99)
Potassium: 3.2 mmol/L — ABNORMAL LOW (ref 3.5–5.1)
Sodium: 131 mmol/L — ABNORMAL LOW (ref 135–145)
Total Bilirubin: 0.4 mg/dL (ref 0.3–1.2)
Total Protein: 3.2 g/dL — ABNORMAL LOW (ref 6.5–8.1)

## 2023-06-25 LAB — LACTIC ACID, PLASMA
Lactic Acid, Venous: 4.4 mmol/L (ref 0.5–1.9)
Lactic Acid, Venous: 3 mmol/L (ref 0.5–1.9)

## 2023-06-25 LAB — BRAIN NATRIURETIC PEPTIDE: B Natriuretic Peptide: 77 pg/mL (ref 0.0–100.0)

## 2023-06-25 LAB — PROTIME-INR
INR: 1.6 — ABNORMAL HIGH (ref 0.8–1.2)
Prothrombin Time: 19.7 s — ABNORMAL HIGH (ref 11.4–15.2)

## 2023-06-25 LAB — APTT: aPTT: 38 s — ABNORMAL HIGH (ref 24–36)

## 2023-06-25 LAB — MRSA NEXT GEN BY PCR, NASAL: MRSA by PCR Next Gen: NOT DETECTED

## 2023-06-25 MED ORDER — NOREPINEPHRINE 4 MG/250ML-% IV SOLN
2.0000 ug/min | INTRAVENOUS | Status: DC
  Administered 2023-06-25 – 2023-06-26 (×2): 2 ug/min via INTRAVENOUS
  Filled 2023-06-25 (×2): qty 250

## 2023-06-25 MED ORDER — POTASSIUM CHLORIDE CRYS ER 20 MEQ PO TBCR
40.0000 meq | EXTENDED_RELEASE_TABLET | Freq: Once | ORAL | Status: AC
  Administered 2023-06-25: 40 meq via ORAL
  Filled 2023-06-25: qty 2

## 2023-06-25 MED ORDER — ACETAMINOPHEN 650 MG RE SUPP: 650.0000 mg | Freq: Four times a day (QID) | RECTAL | Status: DC | PRN

## 2023-06-25 MED ORDER — VANCOMYCIN HCL IN DEXTROSE 1-5 GM/200ML-% IV SOLN: 1000.0000 mg | Freq: Once | INTRAVENOUS | Status: DC

## 2023-06-25 MED ORDER — VANCOMYCIN HCL 1500 MG/300ML IV SOLN
1500.0000 mg | Freq: Once | INTRAVENOUS | Status: AC
  Administered 2023-06-25: 1500 mg via INTRAVENOUS
  Filled 2023-06-25: qty 300

## 2023-06-25 MED ORDER — SODIUM CHLORIDE 0.9 % IV SOLN
2.0000 g | Freq: Two times a day (BID) | INTRAVENOUS | Status: DC
  Administered 2023-06-25 – 2023-06-27 (×4): 2 g via INTRAVENOUS
  Filled 2023-06-25 (×4): qty 12.5

## 2023-06-25 MED ORDER — ONDANSETRON HCL 4 MG PO TABS: 4.0000 mg | ORAL_TABLET | Freq: Four times a day (QID) | ORAL | Status: DC | PRN

## 2023-06-25 MED ORDER — FLUDROCORTISONE ACETATE 0.1 MG PO TABS
0.1000 mg | ORAL_TABLET | Freq: Two times a day (BID) | ORAL | Status: DC
  Administered 2023-06-25 – 2023-06-28 (×6): 0.1 mg via ORAL
  Filled 2023-06-25 (×10): qty 1

## 2023-06-25 MED ORDER — CHLORHEXIDINE GLUCONATE CLOTH 2 % EX PADS
6.0000 | MEDICATED_PAD | Freq: Every day | CUTANEOUS | Status: DC
  Administered 2023-06-26 – 2023-06-27 (×2): 6 via TOPICAL

## 2023-06-25 MED ORDER — LACTATED RINGERS IV BOLUS
1000.0000 mL | Freq: Once | INTRAVENOUS | Status: AC
  Administered 2023-06-25: 1000 mL via INTRAVENOUS

## 2023-06-25 MED ORDER — LACTATED RINGERS IV BOLUS
1500.0000 mL | Freq: Once | INTRAVENOUS | Status: AC
  Administered 2023-06-25: 1500 mL via INTRAVENOUS

## 2023-06-25 MED ORDER — LEVOTHYROXINE SODIUM 137 MCG PO TABS
137.0000 ug | ORAL_TABLET | Freq: Every day | ORAL | Status: DC
  Administered 2023-06-26 – 2023-06-28 (×3): 137 ug via ORAL
  Filled 2023-06-25 (×3): qty 1

## 2023-06-25 MED ORDER — SODIUM CHLORIDE 0.9 % IV BOLUS
500.0000 mL | Freq: Once | INTRAVENOUS | Status: AC
  Administered 2023-06-25: 500 mL via INTRAVENOUS

## 2023-06-25 MED ORDER — VANCOMYCIN HCL IN DEXTROSE 1-5 GM/200ML-% IV SOLN
1000.0000 mg | INTRAVENOUS | Status: DC
  Administered 2023-06-26: 1000 mg via INTRAVENOUS
  Filled 2023-06-25: qty 200

## 2023-06-25 MED ORDER — SODIUM CHLORIDE 0.9 % IV SOLN: INTRAVENOUS | Status: DC

## 2023-06-25 MED ORDER — LACTATED RINGERS IV SOLN: INTRAVENOUS | Status: DC

## 2023-06-25 MED ORDER — ACETAMINOPHEN 325 MG PO TABS
650.0000 mg | ORAL_TABLET | Freq: Four times a day (QID) | ORAL | Status: DC | PRN
  Administered 2023-06-26 – 2023-06-27 (×3): 650 mg via ORAL
  Filled 2023-06-25 (×3): qty 2

## 2023-06-25 MED ORDER — APIXABAN 5 MG PO TABS
5.0000 mg | ORAL_TABLET | Freq: Two times a day (BID) | ORAL | Status: DC
  Administered 2023-06-25 – 2023-06-28 (×6): 5 mg via ORAL
  Filled 2023-06-25 (×6): qty 1

## 2023-06-25 MED ORDER — ONDANSETRON HCL 4 MG/2ML IJ SOLN: 4.0000 mg | Freq: Four times a day (QID) | INTRAMUSCULAR | Status: DC | PRN

## 2023-06-25 MED ORDER — ORAL CARE MOUTH RINSE: 15.0000 mL | OROMUCOSAL | Status: DC | PRN

## 2023-06-25 MED ORDER — METRONIDAZOLE 500 MG/100ML IV SOLN
500.0000 mg | Freq: Once | INTRAVENOUS | Status: AC
  Administered 2023-06-25: 500 mg via INTRAVENOUS
  Filled 2023-06-25: qty 100

## 2023-06-25 MED ORDER — SODIUM CHLORIDE 0.9 % IV SOLN
2.0000 g | Freq: Once | INTRAVENOUS | Status: AC
  Administered 2023-06-25: 2 g via INTRAVENOUS
  Filled 2023-06-25: qty 12.5

## 2023-06-25 MED ORDER — ALBUMIN HUMAN 25 % IV SOLN
25.0000 g | Freq: Once | INTRAVENOUS | Status: AC
  Administered 2023-06-25: 25 g via INTRAVENOUS
  Filled 2023-06-25: qty 100

## 2023-06-25 NOTE — Plan of Care (Signed)

## 2023-06-25 NOTE — ED Provider Notes (Signed)
[facility] AT [facility] Provider Note   CSN: [id number] Arrival date & time: 06/25/23  7829     History  Chief Complaint  Patient presents with   Arm Injury    Kayla Dean is a 72 y.o. female w/ pmhx of arthritis, SBO, hypothyroid, gastric bypass, DVT, PE on BT presenting with bilateral lower extremity edema and left-sided forearm edema,  erythema, warmth and pain.  Patient reports that the bilateral lower extremity edema has been an issue since April the severity comes and goes, but she has been wearing compression stockings with some improvement.  She also has left-sided anterior superficial nonhealing wound on shin.   Patient reports that on Saturday she noticed she had erythema and edema over left forearm her daughter is a Engineer, civil (consulting) and marked the outer edges with a pen.  Streaking erythema has now passed marked lines to about her wrist and mid upper arm on the left side. She reports recent fall in Aug, no recent wounds or IV s on that side.  Patient denies any fever, chills, nausea vomiting diarrhea, chest pain, weakness, dizziness.  She reports she has been taking all of her medications regularly and has not missed a dose of the Eliquis.   Arm Injury      Home Medications Prior to Admission medications   Medication Sig Start Date End Date Taking? Authorizing Provider  acetaminophen (TYLENOL) 500 MG tablet Take 1,000 mg by mouth every 8 (eight) hours as needed for moderate pain.    [provider]  apixaban (ELIQUIS) 5 MG TABS tablet Take 1 tablet (5 mg total) by mouth 2 (two) times daily. 05/11/23 08/09/23  Sharlene Dory, NP  Calcium Carb-Cholecalciferol (CALCIUM 600 + D) 600-200 MG-UNIT TABS Take 1 tablet by mouth every morning.    [provider]  cephALEXin (KEFLEX) 500 MG capsule Take 1 capsule (500 mg total) by mouth 3 (three) times daily. Patient not taking: Reported on 06/12/2023 05/24/23   Geoffery Lyons, MD  Cholecalciferol  (VITAMIN D-3 PO) Take 1 capsule by mouth daily.    [provider]  Elastic Bandages & Supports (ABDOMINAL BINDER/ELASTIC MED) MISC Apply as directed for orthostatic hypotension 05/23/23   Sharlene Dory, NP  ferrous sulfate 325 (65 FE) MG tablet Take 325 mg by mouth every morning.    [provider]  fludrocortisone (FLORINEF) 0.1 MG tablet Take 1 tablet (0.1 mg total) by mouth daily. 06/06/23   Sharlene Dory, NP  furosemide (LASIX) 20 MG tablet Take 1 tablet (20 mg total) by mouth daily. 06/15/23 09/13/23  Sharlene Dory, NP  levothyroxine (SYNTHROID, LEVOTHROID) 137 MCG tablet Take 137 mcg by mouth daily before breakfast.    [provider]  midodrine (PROAMATINE) 5 MG tablet Take 3 tablets (15 mg total) by mouth every 8 (eight) hours. 05/24/23   Sharlene Dory, NP  Multiple Vitamin (MULTIVITAMIN WITH MINERALS) TABS tablet Take 1 tablet by mouth every morning.    [provider]  ondansetron (ZOFRAN-ODT) 4 MG disintegrating tablet Take 1 tablet (4 mg total) by mouth every 6 (six) hours as needed for nausea. 02/08/23   Lucretia Roers, MD  oxyCODONE (OXY IR/ROXICODONE) 5 MG immediate release tablet Take 1 tablet (5 mg total) by mouth every 4 (four) hours as needed for severe pain or breakthrough pain. 05/14/23   Lucretia Roers, MD  zolpidem (AMBIEN) 5 MG tablet Take 1 tablet (5 mg total) by mouth at bedtime as needed for sleep. 02/15/23  Lucretia Roers, MD      Allergies    Patient has no known allergies.    Review of Systems   Review of Systems  Skin:  Positive for rash.    Physical Exam Updated Vital Signs BP 102/67   Pulse (!) 106   Temp 98.4 F (36.9 C)   Resp 18   Ht 5\' 2"  (1.575 m)   Wt 67.1 kg   SpO2 100%   BMI 27.07 kg/m  Physical Exam Vitals and nursing note reviewed.  Constitutional:      General: She is not in acute distress.    Appearance: She is not toxic-appearing.  HENT:     Head: Normocephalic and atraumatic.  Eyes:      General: No scleral icterus.    Conjunctiva/sclera: Conjunctivae normal.  Cardiovascular:     Rate and Rhythm: Regular rhythm. Tachycardia present.     Pulses: Normal pulses.     Heart sounds: Normal heart sounds.  Pulmonary:     Effort: Pulmonary effort is normal. No respiratory distress.     Breath sounds: Normal breath sounds.  Abdominal:     General: Abdomen is flat. Bowel sounds are normal.     Palpations: Abdomen is soft.     Tenderness: There is no abdominal tenderness.  Musculoskeletal:     Right lower leg: Edema present.     Left lower leg: Edema present.  Skin:    General: Skin is warm and dry.     Capillary Refill: Capillary refill takes less than 2 seconds.     Findings: Erythema and lesion present.  Neurological:     General: No focal deficit present.     Mental Status: She is alert and oriented to person, place, and time. Mental status is at baseline.     ED Results / Procedures / Treatments   Labs (all labs ordered are listed, but only abnormal results are displayed) Labs Reviewed - No data to display  EKG None  Radiology No results found.  Procedures Procedures    Medications Ordered in ED Medications - No data to display  ED Course/ Medical Decision Making/ A&P Clinical Course as of 06/25/23 1445  Mon Jun 25, 2023  1321 Re-assessed, pt resting comfortably in bed, BP in room in 90s, MAP mid 70s. Pt has no new complaints.  [JB]    Clinical Course User Index [JB] Krrish Freund, Horald Chestnut, PA-C                                 Medical Decision Making Amount and/or Complexity of Data Reviewed Labs: ordered. Radiology: ordered. ECG/medicine tests: ordered.  Risk Prescription drug management. Decision regarding hospitalization.   Kayla Dean 72 y.o. presented today for BLE edema and cellulitis Working Ddx: dependent edema, venous insufficiency, thrombophlebitis, secondary to medications, CHF, edema, AKI, nephrotic syndrome, dvt, cellulitis,  abscess, sepsis, HOTN   Review of prior external notes: recent admission notes  Unique Tests and My Interpretation:  CBC: WBC 22, hgb 12  Ptt - 38 PT 19.7 INR 1.6 CMP: Na 131, K 3.2 (given PO K), gfr wnl BNP: 77 Lactic: 4.4 EKG: sinus, LAD Chest x-ray: mild, b/l pleural effusions     Problem List / ED Course / Critical interventions / Medication management  Pt reporting to the emergency room with left-sided cellulitis that started Saturday and has gradually gotten worse throughout the last 3 days.  She  denies fever or chills or associated symptoms. Given presentation and patient's symptoms, drew blood cultures and started 500 L bolus.  Upon reassessment patients vitals hypotensive, patient also has white count of 26, lactic of 4.4 immediately started to liter bolus.  Antibiotics given to patient. Pt remains A&O I ordered medication including Abx, Fluids  for cellulitis, sepsis   Reevaluation of the patient after these medicines showed that the patient slightly improved Patients vitals assessed. Upon arrival patient is hemodynamically stable, started undiff sepsis workup -- upon re-assessment patient was slightly hypotensive as well as increase RR, started 2L bolus, sepsis workup and antibiotics ordered.  I have reviewed the patients home medicines and have made adjustments as needed   Pts BP 94/71, sinus, lactic down trending from 4.4 to 3.0 after 2L bolus. Will admit for sepsis, left forearm extremity. Consult: Dr Sherryll Burger, agrees for admission now that vitals stable.   Repeat BP is now 79/59, repaged hospitalist to make aware, gave third 1L bolus and started on Levophed drip.    Plan: Patient stable for admission.  Started on Levophed drip due to continued hypotension after 2 L bolus.  Given antibiotics. Admit for cellulitis, sepsis         Final Clinical Impression(s) / ED Diagnoses Final diagnoses:  None    Rx / DC Orders ED Discharge Orders     None          Smitty Knudsen, PA-C 06/25/23 1547    Derwood Kaplan, MD 06/28/23 2231

## 2023-06-25 NOTE — Consult Note (Signed)
CODE SEPSIS - PHARMACY COMMUNICATION  **Broad Spectrum Antibiotics should be administered within 1 hour of Sepsis diagnosis**  Time Code Sepsis Called/Page Received: 1046  Antibiotics Ordered: Cefepime, Vancomycin, Metronidazole  Time of 1st antibiotic administration: 1056  Additional action taken by pharmacy: N/A  If necessary, Name of Provider/Nurse Contacted: N/A    Will M. Dareen Piano, PharmD Clinical Pharmacist 06/25/2023 1:09 PM

## 2023-06-25 NOTE — ED Notes (Signed)
PA-C noted and marked petal pulses heard via [place] on bilateral feet

## 2023-06-25 NOTE — H&P (Signed)
History and Physical    GENIFER DARNEY ION:[id number] DOB: 07-11-1951 DOA: 06/25/2023  PCP: Billie Lade, MD   Patient coming from: Home  Chief Complaint: Left forearm erythema  HPI: Kayla Dean is a 72 y.o. 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  This began approximately 2-3 days ago.  She has had ongoing bilateral lower extremity edema which is a chronic issue.  She denies any acute injuries and states that she has been taking her medications as prescribed.  She recently fractured a finger on her left upper extremity with a recent fall and has a splint.   ED Course: Vital signs with some initial soft blood pressure readings which have responded to fluid bolus.  Initial lactic acid 4.4 which has down trended to 3.  Leukocytosis of 22,900 noted and hemoglobin 11.6.  Potassium 3.2 and sodium 131.  Chest x-ray with no acute findings noted and left forearm x-ray with no acute findings noted.  She was started on multiple IV antibiotics empirically and given fluid boluses in the ED.  Review of Systems: Reviewed as noted above, otherwise negative.  Past Medical History:  Diagnosis Date   Aortic aneurysm (HCC)    Arthritis    H/O gastric bypass    Hiatal hernia    Hypothyroidism    Small bowel obstruction (HCC)     Past Surgical History:  Procedure Laterality Date   ABDOMINAL HYSTERECTOMY     CHOLECYSTECTOMY     COLON SURGERY     HERNIA REPAIR     LAPAROTOMY N/A 02/02/2023   Procedure: EXPLORATORY LAPAROTOMY, [name], EXPLANT OF ABDOMINAL MESH;  Surgeon: Lucretia Roers, MD;  Location: [facility];  Service: General;  Laterality: N/A;   REPLACEMENT TOTAL KNEE Right    ROTATOR CUFF REPAIR Right    TOTAL  HIP ARTHROPLASTY Right      reports that she has never smoked. She has never used smokeless tobacco. She reports that she does not drink alcohol and does not use drugs.  No Known Allergies  Family History  Problem Relation Age of Onset   CAD Father    CAD Brother     Prior to Admission medications   Medication Sig Start Date End Date Taking? Authorizing Provider  acetaminophen (TYLENOL) 500 MG tablet Take 1,000 mg by mouth every 8 (eight) hours as needed for moderate pain.   Yes [provider]  apixaban (ELIQUIS) 5 MG TABS tablet Take 1 tablet (5 mg total) by mouth 2 (two) times daily. 05/11/23 08/09/23 Yes Sharlene Dory, NP  Calcium Carb-Cholecalciferol (CALCIUM 600 + D) 600-200 MG-UNIT TABS Take 1 tablet by mouth every morning.   Yes [provider]  Cholecalciferol (VITAMIN D-3 PO) Take 1 capsule by mouth daily.   Yes [provider]  fludrocortisone (FLORINEF) 0.1 MG tablet Take 1 tablet (0.1 mg total) by mouth daily. 06/06/23  Yes Sharlene Dory, NP  furosemide (LASIX) 20 MG tablet Take 1 tablet (20 mg total) by mouth daily. 06/15/23 09/13/23 Yes Sharlene Dory, NP  levothyroxine (SYNTHROID, LEVOTHROID) 137 MCG tablet Take 137 mcg by mouth daily before breakfast.   Yes [provider]  Multiple Vitamin (MULTIVITAMIN WITH MINERALS) TABS tablet Take 1 tablet by mouth every morning.   Yes [provider]  ondansetron (ZOFRAN-ODT) 4 MG disintegrating tablet Take 1 tablet (4 mg total) by mouth every 6 (six) hours as needed for nausea. 02/08/23  Yes Lucretia Roers, MD  oxyCODONE (OXY IR/ROXICODONE) 5 MG immediate release tablet Take 1 tablet (5 mg total) by mouth every 4 (four) hours as needed for severe pain or breakthrough pain. 05/14/23  Yes Lucretia Roers, MD  zolpidem (AMBIEN) 5 MG tablet Take 1 tablet (5 mg total) by mouth at bedtime as needed for sleep. 02/15/23  Yes Lucretia Roers, MD  Elastic Bandages & Supports (ABDOMINAL  BINDER/ELASTIC MED) MISC Apply as directed for orthostatic hypotension 05/23/23   Sharlene Dory, NP    Physical Exam: Vitals:   06/25/23 1320 06/25/23 1430 06/25/23 1506 06/25/23 1509  BP: 94/71 (!) 79/59  (!) 89/58  Pulse:   87   Resp: 14 12 11 18   Temp:   98 F (36.7 C)   TempSrc:   Oral   SpO2: 96%  93%   Weight:   71 kg   Height:   5\' 2"  (1.575 m)     Constitutional: NAD, calm, comfortable Vitals:   06/25/23 1320 06/25/23 1430 06/25/23 1506 06/25/23 1509  BP: 94/71 (!) 79/59  (!) 89/58  Pulse:   87   Resp: 14 12 11 18   Temp:   98 F (36.7 C)   TempSrc:   Oral   SpO2: 96%  93%   Weight:   71 kg   Height:   5\' 2"  (1.575 m)    Eyes: lids and conjunctivae normal Neck: normal, supple Respiratory: clear to auscultation bilaterally. Normal respiratory effort. No accessory muscle use.  Cardiovascular: Regular rate and rhythm, no murmurs. Abdomen: no tenderness, no distention. Bowel sounds positive.  Musculoskeletal:  No edema. Skin:    Psychiatric: Flat affect  Labs on Admission: I have personally reviewed following labs and imaging studies  CBC: Recent Labs  Lab 06/25/23 1029  WBC 22.9*  NEUTROABS 19.9*  HGB 11.6*  HCT 36.1  MCV 99.7  PLT 225   Basic Metabolic Panel: Recent Labs  Lab 06/19/23 1051 06/25/23 1029  NA 133* 131*  K 4.2 3.2*  CL 99 98  CO2 27 25  GLUCOSE 106* 125*  BUN 13 14  CREATININE 0.60 0.61  CALCIUM 6.9* 6.6*   GFR: Estimated Creatinine Clearance: 57.8 mL/min (by C-G formula based on SCr of 0.61 mg/dL). Liver Function Tests: Recent Labs  Lab 06/25/23 1029  AST 18  ALT 22  ALKPHOS 96  BILITOT 0.4  PROT 3.2*  ALBUMIN <1.5*   No results for input(s): "LIPASE", "AMYLASE" in the last 168 hours. No results for input(s): "AMMONIA" in the last 168 hours. Coagulation Profile: Recent Labs  Lab 06/25/23 1029  INR 1.6*   Cardiac Enzymes: No results for input(s): "CKTOTAL", "CKMB", "CKMBINDEX", "TROPONINI" in the last 168  hours. BNP (last 3 results) No results for input(s): "PROBNP" in the last 8760 hours. HbA1C: No results for input(s): "HGBA1C" in the last 72 hours. CBG: No results for input(s): "GLUCAP" in the last 168 hours. Lipid Profile: No results for input(s): "CHOL", "HDL", "LDLCALC", "TRIG", "CHOLHDL", "LDLDIRECT" in the last 72 hours. Thyroid Function Tests: No results for input(s): "TSH", "T4TOTAL", "FREET4", "T3FREE", "THYROIDAB" in the last 72 hours. Anemia Panel: No results for input(s): "VITAMINB12", "FOLATE", "FERRITIN", "TIBC", "IRON", "RETICCTPCT" in the last 72 hours. Urine analysis:    Component Value Date/Time   COLORURINE YELLOW 05/23/2023 2233   APPEARANCEUR HAZY (A) 05/23/2023 2233  LABSPEC 1.030 05/23/2023 2233   PHURINE 5.0 05/23/2023 2233   GLUCOSEU NEGATIVE 05/23/2023 2233   HGBUR NEGATIVE 05/23/2023 2233   BILIRUBINUR NEGATIVE 05/23/2023 2233   KETONESUR 5 (A) 05/23/2023 2233   PROTEINUR NEGATIVE 05/23/2023 2233   NITRITE POSITIVE (A) 05/23/2023 2233   LEUKOCYTESUR SMALL (A) 05/23/2023 2233    Radiological Exams on Admission: DG Chest Port 1 View  Result Date: 06/25/2023 CLINICAL DATA:  Left arm pain/redness, swelling. Questionable sepsis. EXAM: PORTABLE CHEST 1 VIEW COMPARISON:  Chest radiograph 04/12/2023 FINDINGS: The cardiomediastinal silhouette is normal There are small bilateral pleural effusions with minimal adjacent atelectasis. There is no other focal airspace opacity. There is no pneumothorax There is no acute osseous abnormality. IMPRESSION: Mild bilateral pleural effusions with minimal adjacent atelectasis. Electronically Signed   By: Lesia Hausen M.D.   On: 06/25/2023 11:28   DG Forearm Left  Result Date: 06/25/2023 CLINICAL DATA:  Edema, swelling and redness beginning 3 days ago. EXAM: LEFT FOREARM - 2 VIEW COMPARISON:  None Available. FINDINGS: No sign of foreign object.  No abnormal bone or joint finding. IMPRESSION: Negative. Electronically Signed   By:  Paulina Fusi M.D.   On: 06/25/2023 11:27    EKG: Independently reviewed. SR 91bpm.  Assessment/Plan Principal Problem:   Severe sepsis (HCC) Active Problems:   Acquired hypothyroidism   Gastroesophageal reflux disease   H/O gastric bypass   Bilateral lower extremity edema   Pulmonary embolism (HCC)   Anemia    Severe sepsis secondary to left upper extremity cellulitis -Continue on vancomycin obtain MRSA PCR -Follow-up blood cultures -Follow CBC for leukocytosis as well as lactic acidosis -Continue aggressive IV fluid with normal saline -Monitor closely in stepdown unit in case pressors are required due to borderline blood pressure readings  Mild hypokalemia/hyponatremia -Continue to replete and monitor  Hypotension -Exacerbated by sepsis, but responded to fluid bolus -Continue fludrocortisone/midodrine  History of recent acute PE/right lower extremity DVT -Continue on Eliquis for at least 3 months per hematology  Mild macrocytic anemia -Follows hematology, no signs of blood loss -Recently discontinued oral iron  Hypothyroidism -Continue Synthroid  Abdominal pain with questionable chronic mesenteric ischemia -Follows with Dr. Henreitta Leber who does not feel that there is chronic mesenteric ischemia present  DVT prophylaxis: Eliquis Code Status: Full Family Communication: Sister, Pam at bedside Disposition Plan: Admit for treatment of sepsis/cellulitis Consults called: None Admission status: Inpatient, stepdown  Severity of Illness: The appropriate patient status for this patient is INPATIENT. Inpatient status is judged to be reasonable and necessary in order to provide the required intensity of service to ensure the patient's safety. The patient's presenting symptoms, physical exam findings, and initial radiographic and laboratory data in the context of their chronic comorbidities is felt to place them at high risk for further clinical deterioration. Furthermore, it is not  anticipated that the patient will be medically stable for discharge from the hospital within 2 midnights of admission.   * I certify that at the point of admission it is my clinical judgment that the patient will require inpatient hospital care spanning beyond 2 midnights from the point of admission due to high intensity of service, high risk for further deterioration and high frequency of surveillance required.*   Adeana Grilliot D Sangita Zani DO [facility]  If 7PM-7AM, please contact night-coverage [email or web address]  06/25/2023, 3:21 PM

## 2023-06-25 NOTE — Sepsis Progress Note (Signed)
Sepsis protocol monitored by eLink ?

## 2023-06-25 NOTE — ED Notes (Signed)
Pt states her BP typically runs in the 80s/60s at baseline

## 2023-06-25 NOTE — Progress Notes (Signed)
Levophed able to be weaned off at 1515. Dr. Sherryll Burger aware.

## 2023-06-25 NOTE — ED Triage Notes (Addendum)
Left arm pain/redness and swelling. Sx began Saturday. Pt also c/o le edema since July.

## 2023-06-25 NOTE — Consult Note (Addendum)
Pharmacy Antibiotic Note  ASSESSMENT: 72 y.o. female with PMH including SBO, gastric bypass is presenting with cellulitis. Patient reports ongoing bilateral lower extremity edema since April 2024 with superficial nonhealing wound on left shin. Unusual for cellulitis to be bilateral. Patient is hypotensive with leukocytosis. Pharmacy has been consulted to manage cefepime and vancomycin dosing.  Patient measurements: Height: 5\' 2"  (157.5 cm) Weight: 67.1 kg (148 lb) IBW/kg (Calculated) : 50.1  Vital signs: Temp: 98.4 F (36.9 C) (09/09 0946) BP: 85/62 (09/09 1205) Pulse Rate: 88 (09/09 1037) Recent Labs  Lab 06/19/23 1051 06/25/23 1029  WBC  --  22.9*  CREATININE 0.60 0.61   Estimated Creatinine Clearance: 56.3 mL/min (by C-G formula based on SCr of 0.61 mg/dL).  Allergies: No Known Allergies  Antimicrobials this admission: Cefepime 9/9 >>  Vancomycin 9/9 >> Metronidazole x 1  Dose adjustments this admission: N/A  Microbiology results: 9/9 BCx: pending  PLAN: Initiate cefepime 2 g IV q12H Administer vancomycin 1500 mg IV x 1 as a loading dose and initiate vancomycin 1000 mg IV q24H thereafter eAUC 455, Cmax 31, Cmin 11 Scr 0.8, IBW, Vd 0.72 Follow up culture results to assess for antibiotic optimization. Monitor renal function to assess for any necessary antibiotic dosing changes.   Thank you for allowing pharmacy to be a part of this patient's care.  Will M. Dareen Piano, PharmD Clinical Pharmacist 06/25/2023 1:10 PM

## 2023-06-25 NOTE — ED Notes (Signed)
Both sets of blood cultures obtained before any antibiotic administration  

## 2023-06-26 ENCOUNTER — Inpatient Hospital Stay (HOSPITAL_COMMUNITY): Payer: Medicare Other

## 2023-06-26 ENCOUNTER — Other Ambulatory Visit: Payer: Self-pay

## 2023-06-26 DIAGNOSIS — E876 Hypokalemia: Secondary | ICD-10-CM | POA: Diagnosis not present

## 2023-06-26 DIAGNOSIS — E8809 Other disorders of plasma-protein metabolism, not elsewhere classified: Secondary | ICD-10-CM

## 2023-06-26 DIAGNOSIS — E46 Unspecified protein-calorie malnutrition: Secondary | ICD-10-CM

## 2023-06-26 DIAGNOSIS — I9589 Other hypotension: Secondary | ICD-10-CM | POA: Diagnosis not present

## 2023-06-26 DIAGNOSIS — A419 Sepsis, unspecified organism: Secondary | ICD-10-CM | POA: Diagnosis not present

## 2023-06-26 LAB — BASIC METABOLIC PANEL
Anion gap: 6 (ref 5–15)
BUN: 11 mg/dL (ref 8–23)
CO2: 24 mmol/L (ref 22–32)
Calcium: 6.7 mg/dL — ABNORMAL LOW (ref 8.9–10.3)
Chloride: 105 mmol/L (ref 98–111)
Creatinine, Ser: 0.45 mg/dL (ref 0.44–1.00)
GFR, Estimated: >60 mL/min (ref >60)
Glucose, Bld: 95 mg/dL (ref 70–99)
Potassium: 3.3 mmol/L — ABNORMAL LOW (ref 3.5–5.1)
Sodium: 135 mmol/L (ref 135–145)

## 2023-06-26 LAB — CBC
HCT: 31.6 % — ABNORMAL LOW (ref 36.0–46.0)
Hemoglobin: 10.2 g/dL — ABNORMAL LOW (ref 12.0–15.0)
MCH: 32.2 pg (ref 26.0–34.0)
MCHC: 32.3 g/dL (ref 30.0–36.0)
MCV: 99.7 fL (ref 80.0–100.0)
Platelets: 215 10*3/uL (ref 150–400)
RBC: 3.17 MIL/uL — ABNORMAL LOW (ref 3.87–5.11)
RDW: 14.4 % (ref 11.5–15.5)
WBC: 23.4 10*3/uL — ABNORMAL HIGH (ref 4.0–10.5)
nRBC: 0 % (ref 0.0–0.2)

## 2023-06-26 LAB — MAGNESIUM: Magnesium: 1.7 mg/dL (ref 1.7–2.4)

## 2023-06-26 LAB — LACTIC ACID, PLASMA: Lactic Acid, Venous: 1.4 mmol/L (ref 0.5–1.9)

## 2023-06-26 MED ORDER — SODIUM CHLORIDE 0.9% FLUSH: 10.0000 mL | INTRAVENOUS | Status: DC | PRN

## 2023-06-26 MED ORDER — POTASSIUM CHLORIDE CRYS ER 20 MEQ PO TBCR
40.0000 meq | EXTENDED_RELEASE_TABLET | Freq: Once | ORAL | Status: AC
  Administered 2023-06-26: 40 meq via ORAL
  Filled 2023-06-26: qty 2

## 2023-06-26 MED ORDER — ALBUMIN HUMAN 25 % IV SOLN
25.0000 g | Freq: Once | INTRAVENOUS | Status: AC
  Administered 2023-06-26: 25 g via INTRAVENOUS
  Filled 2023-06-26: qty 100

## 2023-06-26 MED ORDER — SODIUM CHLORIDE 0.9% FLUSH
10.0000 mL | Freq: Two times a day (BID) | INTRAVENOUS | Status: DC
  Administered 2023-06-26 – 2023-06-27 (×3): 10 mL

## 2023-06-26 MED ORDER — MIDODRINE HCL 5 MG PO TABS
10.0000 mg | ORAL_TABLET | Freq: Three times a day (TID) | ORAL | Status: DC
  Administered 2023-06-26 – 2023-06-28 (×8): 10 mg via ORAL
  Filled 2023-06-26 (×8): qty 2

## 2023-06-26 MED ORDER — MAGNESIUM SULFATE 2 GM/50ML IV SOLN
2.0000 g | Freq: Once | INTRAVENOUS | Status: AC
  Administered 2023-06-26: 2 g via INTRAVENOUS
  Filled 2023-06-26: qty 50

## 2023-06-26 NOTE — TOC Initial Note (Signed)
Transition of Care [facility]) - Initial/Assessment Note    Patient Details  Name: Kayla Dean MRN: [id number] Date of Birth: [birth year]  [facility] CM/SW Contact:    Elliot Gault, LCSW Phone Number: 06/26/2023, 2:18 PM  Clinical Narrative:                  Pt admitted from home. She has a high readmission risk score. Pt sleeping at time of attempted [facility] visit today.  Spoke with pt's daughter, Marcelino Duster, to complete assessment. Per Marcelino Duster, pt lives alone. She has a walker for ambulation. Pt's sister or one of her daughters help make sure she can get to appointments and obtain her medications.  Pt had [facility] up until about three weeks ago. Marcelino Duster aware that [facility] can be arranged again at dc if needed.  TOC will follow and continue to assess and assist with dc planning.  Expected Discharge Plan: Home w Home Health Services Barriers to Discharge: Continued Medical Work up   Patient Goals and CMS Choice Patient states their goals for this hospitalization and ongoing recovery are:: get better CMS Medicare.gov Compare Post Acute Care list provided to:: Patient Represenative (must comment) Choice offered to / list presented to : Adult Children      Expected Discharge Plan and Services In-house Referral: Clinical Social Work     Living arrangements for the past 2 months: Single Family Home                                      Prior Living Arrangements/Services Living arrangements for the past 2 months: Single Family Home Lives with:: Self Patient language and need for interpreter reviewed:: Yes Do you feel safe going back to the place where you live?: Yes      Need for Family Participation in Patient Care: No (Comment) Care giver support system in place?: Yes (comment) Current home services: DME Criminal Activity/Legal Involvement Pertinent to Current Situation/Hospitalization: No - Comment as needed  Activities of Daily Living Home Assistive  Devices/Equipment: Eyeglasses, Dentures (specify type), Shower chair without back, Walker (specify type), Wheelchair ADL Screening (condition at time of admission) Patient's cognitive ability adequate to safely complete daily activities?: Yes Is the patient deaf or have difficulty hearing?: No Does the patient have difficulty seeing, even when wearing glasses/contacts?: No Does the patient have difficulty concentrating, remembering, or making decisions?: No Patient able to express need for assistance with ADLs?: Yes Does the patient have difficulty dressing or bathing?: No Independently performs ADLs?: Yes (appropriate for developmental age) Does the patient have difficulty walking or climbing stairs?: Yes Weakness of Legs: None Weakness of Arms/Hands: None  Permission Sought/Granted                  Emotional Assessment Appearance:: Appears stated age     Orientation: : Oriented to Self, Oriented to Place, Oriented to  Time, Oriented to Situation Alcohol / Substance Use: Not Applicable Psych Involvement: No (comment)  Admission diagnosis:  Severe sepsis (HCC) [A41.9, R65.20] Patient Active Problem List   Diagnosis Date Noted   Severe sepsis (HCC) 06/25/2023   Anemia 05/28/2023   Falls 05/28/2023   Pulmonary embolism (HCC) 05/26/2023   Acute deep vein thrombosis (DVT) of right peroneal vein (HCC) 04/21/2023   Malnutrition of moderate degree 04/17/2023   Hypotension 04/13/2023   Elevated brain natriuretic peptide (BNP) level 04/13/2023   Hypoalbuminemia 04/13/2023  Bilateral lower extremity edema 04/12/2023   Peripheral edema 04/12/2023   Abdominal pain, right lateral 02/27/2023   Generalized body aches 02/15/2023   Shock liver 02/03/2023   H/O gastric bypass 02/02/2023   Incarcerated ventral hernia 02/02/2023   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   Gastroesophageal reflux disease    Acquired hypothyroidism 08/13/2018   Chest pain 08/12/2018   Incisional  hernia, without obstruction or gangrene 04/25/2018   S/P repair of paraesophageal hernia 03/20/2017   Bilateral carpal tunnel syndrome 11/02/2015   Numbness and tingling 10/09/2015   BPV (benign positional vertigo) 10/04/2015   Chronic tension-type headache, not intractable 10/04/2015   Vasovagal syncope 10/04/2015   PCP:  Billie Lade, MD Pharmacy:   [facility] - [place], [place] - [address] [place] [address] Phone: [phone] Fax: [phone]     Social Determinants of Health (SDOH) Social History: SDOH Screenings   Food Insecurity: No Food Insecurity (06/25/2023)  Housing: Low Risk  (06/25/2023)  Transportation Needs: No Transportation Needs (06/25/2023)  Utilities: Not At Risk (06/25/2023)  Tobacco Use: Low Risk  (06/25/2023)   SDOH Interventions:     Readmission Risk Interventions    06/26/2023    2:18 PM 04/26/2023   12:09 PM  Readmission Risk Prevention Plan  Transportation Screening Complete Complete  PCP or Specialist Appt within 3-5 Days  Complete  HRI or Home Care Consult Complete Complete  Social Work Consult for Recovery Care Planning/Counseling Complete Complete  Palliative Care Screening Not Applicable Not Applicable  Medication Review Oceanographer) Complete Complete

## 2023-06-26 NOTE — Progress Notes (Addendum)
Progress Note  RN called due to patient having low blood pressure 74/51.  Chart was reviewed and it was noted that patient was admitted for severe sepsis due to left upper extremity cellulitis.  Albumin was noted to be less than 1.5 and at bedside, patient was noted to be third spacing from upper extremity.  IV albumin 25 g x 1 without much improvement in BP.  Midodrine was started as indicated in admission H&P.  Systolic was still in the high 70s.  Patient was then started on IV Levophed to maintain a MAP of at least 65%.  IV albumin 25 g x 1 will be given Patient was not in an acute distress at bedside.  She states that SBP was usually in the 110s, but since she had an emergency gastric bypass due to her incarcerated hernia, her BP value in the high 80s and low 90s.  BP (!) 74/51 Comment: MD notified, okay to start LEVO back  Pulse 85   Temp 99.1 F (37.3 C) (Axillary)   Resp 13   Ht 5\' 2"  (1.575 m)   Wt 71 kg   SpO2 95%   BMI 28.63 kg/m   Assessment and plan Hypotension in the setting of sepsis due to left upper extremity cellulitis Continue management as described above and continue management as described in admission H&P Patient was transferred from stepdown unit to ICU  Hypoalbuminemia possibly due to severe protein calorie malnutrition Albumin was provided as described above  Hypokalemia K+ 3.3, this will be replenished  Note: Please refer to admission H&P for details regarding the care of this patient.  Critical time: 37 minutes   Critical care personally provided  managing the patient due to high probability of clinically significant and life threatening deterioration. This critical care time included obtaining a history; examining the patient, pulse oximetry; ordering and review of studies; arranging urgent treatment with development of a management plan; evaluation of patient's response of treatment; frequent reassessment; and discussions with other providers.  This  critical care time was performed to assess and manage the high probability of imminent and life threatening deterioration that could result in multi-organ failure.

## 2023-06-26 NOTE — Progress Notes (Signed)
Peripherally Inserted Central Catheter Placement  The IV Nurse has discussed with the patient and/or persons authorized to consent for the patient, the purpose of this procedure and the potential benefits and risks involved with this procedure.  The benefits include less needle sticks, lab draws from the catheter, and the patient may be discharged home with the catheter. Risks include, but not limited to, infection, bleeding, blood clot (thrombus formation), and puncture of an artery; nerve damage and irregular heartbeat and possibility to perform a PICC exchange if needed/ordered by physician.  Alternatives to this procedure were also discussed.  Bard Power PICC patient education guide, fact sheet on infection prevention and patient information card has been provided to patient /or left at bedside.    PICC Placement Documentation  PICC Triple Lumen 06/26/23 Right Brachial 37 cm 0 cm (Active)  Indication for Insertion or Continuance of Line Vasoactive infusions 06/26/23 1746  Exposed Catheter (cm) 0 cm 06/26/23 1746  Site Assessment Clean, Dry, Intact 06/26/23 1746  Lumen #1 Status Flushed;Saline locked;Blood return noted 06/26/23 1746  Lumen #2 Status Flushed;Saline locked;Blood return noted 06/26/23 1746  Lumen #3 Status Flushed;Saline locked;Blood return noted 06/26/23 1746  Dressing Type Transparent;Securing device 06/26/23 1746  Dressing Status Antimicrobial disc in place;Clean, Dry, Intact 06/26/23 1746  Line Care Connections checked and tightened 06/26/23 1746  Line Adjustment (NICU/IV Team Only) No 06/26/23 1746  Dressing Intervention New dressing 06/26/23 1746  Dressing Change Due 07/03/23 06/26/23 1746    Already swollen from medial and below the RUA PICC site to the R hand prior to insertion.   Annett Fabian 06/26/2023, 5:47 PM

## 2023-06-26 NOTE — Progress Notes (Addendum)
PICC placed with ECG technology. Image failed to send to chart due to intranet outage. CXR ordered, however, images attached through media to this note. RUA PICC is ready to use. Order sets entered. RN aware.

## 2023-06-26 NOTE — Progress Notes (Addendum)
PROGRESS NOTE    Kayla Dean  WUJ:[id number] DOB: 15-Jun-1951 DOA: 06/25/2023 PCP: Billie Lade, MD   Brief Narrative:     Kayla Dean is a 72 y.o. 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  Patient was admitted with severe sepsis secondary to left upper extremity cellulitis and also had some exacerbation in her chronic hypotension and is now requiring some norepinephrine infusion.  She will require PICC line placement today.  Assessment & Plan:   Principal Problem:   Severe sepsis (HCC) Active Problems:   Acquired hypothyroidism   Gastroesophageal reflux disease   H/O gastric bypass   Bilateral lower extremity edema   Pulmonary embolism (HCC)   Anemia  Assessment and Plan:  Severe sepsis secondary to left upper extremity cellulitis -Continue on vancomycin obtain MRSA PCR; plan to discontinue vancomycin once initial blood cultures result -Follow-up blood cultures still pending -Follow CBC for leukocytosis as well as lactic acidosis which has now improved -Continue aggressive IV fluid with normal saline -Monitor closely in stepdown unit in case pressors are required due to borderline blood pressure readings   Mild hypokalemia -Continue to replete and monitor   Hypotension -Exacerbated by sepsis, which initially responded to fluid bolus -Continue fludrocortisone/midodrine -Resumed norepinephrine overnight   History of recent acute PE/right lower extremity DVT -Continue on Eliquis for at least 3 months per hematology   Mild macrocytic anemia -Follows hematology, no signs of blood loss -Recently discontinued oral iron -Continue to monitor CBC   Hypothyroidism -Continue Synthroid   Abdominal pain with  questionable chronic mesenteric ischemia -Follows with Dr. Henreitta Leber who does not feel that there is chronic mesenteric ischemia present  Lower extremity wound -Appreciate wound care evaluation -Recommend outpatient wound care management  Obesity -BMI 30.6   DVT prophylaxis:Eliquis Code Status: Full Family Communication: None at bedside Disposition Plan: Continue ongoing treatment for cellulitis/hypotension Status is: Inpatient Remains inpatient appropriate because: Need for IV antibiotics  Consultants:  None  Procedures:  None  Antimicrobials:  Anti-infectives (From admission, onward)    Start     Dose/Rate Route Frequency Ordered Stop   06/26/23 1400  vancomycin (VANCOCIN) IVPB 1000 mg/200 mL premix        1,000 mg 200 mL/hr over 60 Minutes Intravenous Every 24 hours 06/25/23 1322     06/25/23 2200  ceFEPIme (MAXIPIME) 2 g in sodium chloride 0.9 % 100 mL IVPB        2 g 200 mL/hr over 30 Minutes Intravenous Every 12 hours 06/25/23 1322     06/25/23 1100  ceFEPIme (MAXIPIME) 2 g in sodium chloride 0.9 % 100 mL IVPB        2 g 200 mL/hr over 30 Minutes Intravenous  Once 06/25/23 1046 06/25/23 1503   06/25/23 1100  metroNIDAZOLE (FLAGYL) IVPB 500 mg        500 mg 100 mL/hr over 60 Minutes Intravenous  Once 06/25/23 1046 06/25/23 1503   06/25/23 1100  vancomycin (VANCOCIN) IVPB 1000 mg/200 mL premix  Status:  Discontinued        1,000 mg 200 mL/hr over 60 Minutes Intravenous  Once 06/25/23 1046 06/25/23 1048   06/25/23 1100  vancomycin (VANCOREADY) IVPB 1500 mg/300 mL        1,500 mg 150 mL/hr over 120 Minutes Intravenous  Once 06/25/23 1048 06/25/23 1537      Subjective: Patient seen and evaluated today with no new acute complaints or concerns. No acute concerns or events noted overnight.  She continues to have significant upper extremity edema, but otherwise feels well.  Noted to have significant worsening hypotension overnight which required reinitiation of  norepinephrine.  Objective: Vitals:   06/26/23 0945 06/26/23 1000 06/26/23 1015 06/26/23 1110  BP: (!) 96/49 (!) 96/50 (!) 88/48   Pulse: 86 63 68   Resp: 17 14 12    Temp:    97.6 F (36.4 C)  TempSrc:    Oral  SpO2: 96% 94% 94%   Weight:      Height:        Intake/Output Summary (Last 24 hours) at 06/26/2023 1204 Last data filed at 06/26/2023 0539 Gross per 24 hour  Intake 3136.86 ml  Output 200 ml  Net 2936.86 ml   Filed Weights   06/25/23 0946 06/25/23 1506 06/26/23 0411  Weight: 67.1 kg 71 kg 75.9 kg    Examination:  General exam: Appears calm and comfortable  Respiratory system: Clear to auscultation. Respiratory effort normal. Cardiovascular system: S1 & S2 heard, RRR.  Gastrointestinal system: Abdomen is soft Central nervous system: Alert and awake Extremities: Edematous in all 4 extremities, with erythema to left upper extremity Skin: No significant lesions noted Psychiatry: Flat affect.    Data Reviewed: I have personally reviewed following labs and imaging studies  CBC: Recent Labs  Lab 06/25/23 1029 06/26/23 0435  WBC 22.9* 23.4*  NEUTROABS 19.9*  --   HGB 11.6* 10.2*  HCT 36.1 31.6*  MCV 99.7 99.7  PLT 225 215   Basic Metabolic Panel: Recent Labs  Lab 06/25/23 1029 06/26/23 0435  NA 131* 135  K 3.2* 3.3*  CL 98 105  CO2 25 24  GLUCOSE 125* 95  BUN 14 11  CREATININE 0.61 0.45  CALCIUM 6.6* 6.7*  MG  --  1.7   GFR: Estimated Creatinine Clearance: 59.7 mL/min (by C-G formula based on SCr of 0.45 mg/dL). Liver Function Tests: Recent Labs  Lab 06/25/23 1029  AST 18  ALT 22  ALKPHOS 96  BILITOT 0.4  PROT 3.2*  ALBUMIN <1.5*   No results for input(s): "LIPASE", "AMYLASE" in the last 168 hours. No results for input(s): "AMMONIA" in the last 168 hours. Coagulation Profile: Recent Labs  Lab 06/25/23 1029  INR 1.6*   Cardiac Enzymes: No results for input(s): "CKTOTAL", "CKMB", "CKMBINDEX", "TROPONINI" in the last 168  hours. BNP (last 3 results) No results for input(s): "PROBNP" in the last 8760 hours. HbA1C: No results for input(s): "HGBA1C" in the last 72 hours. CBG: No results for input(s): "GLUCAP" in the last 168 hours. Lipid Profile: No results for input(s): "CHOL", "HDL", "LDLCALC", "TRIG", "CHOLHDL", "LDLDIRECT" in the last 72 hours. Thyroid Function Tests: No results for input(s): "TSH", "T4TOTAL", "FREET4", "T3FREE", "THYROIDAB" in the last 72 hours. Anemia Panel: No results for input(s): "VITAMINB12", "FOLATE", "FERRITIN", "TIBC", "IRON", "RETICCTPCT" in the last 72 hours. Sepsis Labs: Recent Labs  Lab 06/25/23 1029 06/25/23 1311 06/26/23 0435  LATICACIDVEN 4.4* 3.0* 1.4    Recent Results (from the past 240 hour(s))  Blood Culture (routine x 2)     Status: None (Preliminary result)   Collection Time: 06/25/23 10:25 AM   Specimen: Right Antecubital; Blood  Result Value Ref Range Status   Specimen Description RIGHT ANTECUBITAL  Final   Special Requests   Final    BOTTLES DRAWN  AEROBIC AND ANAEROBIC Blood Culture results may not be optimal due to an excessive volume of blood received in culture bottles Performed at [facility], [address], [place], [place] [address]    Culture PENDING  Incomplete   Report Status PENDING  Incomplete  Blood Culture (routine x 2)     Status: None (Preliminary result)   Collection Time: 06/25/23 10:35 AM   Specimen: Right Antecubital; Blood  Result Value Ref Range Status   Specimen Description RIGHT ANTECUBITAL  Final   Special Requests   Final    BOTTLES DRAWN AEROBIC AND ANAEROBIC Blood Culture results may not be optimal due to an inadequate volume of blood received in culture bottles Performed at [facility], [address] [place], [place] [address]    Culture PENDING  Incomplete   Report Status PENDING  Incomplete  MRSA Next Gen by PCR, Nasal     Status: None   Collection Time: 06/25/23  3:15 PM   Specimen: Nasal Mucosa; Nasal Swab   Result Value Ref Range Status   MRSA by PCR Next Gen NOT DETECTED NOT DETECTED Final    Comment: (NOTE) The GeneXpert MRSA Assay (FDA approved for NASAL specimens only), is one component of a comprehensive MRSA colonization surveillance program. It is not intended to diagnose MRSA infection nor to guide or monitor treatment for MRSA infections. Test performance is not FDA approved in patients less than 43 years old. Performed at [facility], [address] [place], [place] [address]          Radiology Studies: [place] EKG SITE RITE  Result Date: 06/26/2023 If Site Rite image not attached, placement could not be confirmed due to current cardiac rhythm.  DG Chest Port 1 View  Result Date: 06/25/2023 CLINICAL DATA:  Left arm pain/redness, swelling. Questionable sepsis. EXAM: PORTABLE CHEST 1 VIEW COMPARISON:  Chest radiograph 04/12/2023 FINDINGS: The cardiomediastinal silhouette is normal There are small bilateral pleural effusions with minimal adjacent atelectasis. There is no other focal airspace opacity. There is no pneumothorax There is no acute osseous abnormality. IMPRESSION: Mild bilateral pleural effusions with minimal adjacent atelectasis. Electronically Signed   By: Lesia Hausen M.D.   On: 06/25/2023 11:28   DG Forearm Left  Result Date: 06/25/2023 CLINICAL DATA:  Edema, swelling and redness beginning 3 days ago. EXAM: LEFT FOREARM - 2 VIEW COMPARISON:  None Available. FINDINGS: No sign of foreign object.  No abnormal bone or joint finding. IMPRESSION: Negative. Electronically Signed   By: Paulina Fusi M.D.   On: 06/25/2023 11:27        Scheduled Meds:  apixaban  5 mg Oral BID   Chlorhexidine Gluconate Cloth  6 each Topical Daily   fludrocortisone  0.1 mg Oral BID   levothyroxine  137 mcg Oral QAC breakfast   midodrine  10 mg Oral TID WC   Continuous Infusions:  sodium chloride 75 mL/hr at 06/26/23 0539   ceFEPime (MAXIPIME) IV 2 g (06/26/23 0901)   norepinephrine  (LEVOPHED) Adult infusion 7 mcg/min (06/26/23 0539)   vancomycin       LOS: 1 day   Total critical care time: 40 minutes.    Branndon Tuite Hoover Brunette, DO [facility]  If 7PM-7AM, please contact night-coverage [email or web address] 06/26/2023, 12:04 PM

## 2023-06-26 NOTE — Consult Note (Signed)
WOC Nurse Consult Note: patient states she has longstanding edema to bilateral legs with intermittent wounds; L leg usually worse than R; denies any wounds on R leg today, blood pressure cuff covering Reason for Consult: L leg wound  Wound type: full thickness, ? R/t venous insufficiency  Pressure Injury POA: NA  Measurement: L anterior lower leg 2 cm x 3 cm  Wound bed: 80% pink moist 20% yellow slough  Drainage (amount, consistency, odor) minimal serosanguinous at this time however patient states this leg frequently weeps and she will wake up with her sheets wet from this  Periwound: pitting edema  Dressing procedure/placement/frequency: Clean L lower anterior leg wound with NS, apply silver hydrofiber [place] [phone]) cut to fit wound bed daily, cover with ABD pad and wrap leg in Kerlix roll gauze beginning just above toes and ending right below knee.  May secure with Ace wrap in same fashion as Kerlix for light compression.   Patient states she does wear compression hose at home, these are not prescription and are some she had gotten on a former hospitalization. Patient states she has not followed with anyone for these legs other than her cardiologist.  Patient may warrant vascular consult or referral to wound care center at discharge for ongoing treatment of her edema and recurrent ulcers.   POC discussed with patient, bedside nurse and primary MD.  WOC team will not follow at this time. Re-consult if further needs arise.   Thank you,    Priscella Mann MSN, RN-BC, [employer] [phone]

## 2023-06-26 NOTE — Plan of Care (Signed)

## 2023-06-27 ENCOUNTER — Ambulatory Visit: Payer: Medicare Other | Admitting: Nurse Practitioner

## 2023-06-27 DIAGNOSIS — R6520 Severe sepsis without septic shock: Secondary | ICD-10-CM | POA: Diagnosis not present

## 2023-06-27 DIAGNOSIS — E43 Unspecified severe protein-calorie malnutrition: Secondary | ICD-10-CM | POA: Diagnosis present

## 2023-06-27 DIAGNOSIS — R600 Localized edema: Secondary | ICD-10-CM | POA: Diagnosis not present

## 2023-06-27 DIAGNOSIS — I2693 Single subsegmental pulmonary embolism without acute cor pulmonale: Secondary | ICD-10-CM

## 2023-06-27 DIAGNOSIS — A419 Sepsis, unspecified organism: Secondary | ICD-10-CM | POA: Diagnosis not present

## 2023-06-27 LAB — CBC
HCT: 28.4 % — ABNORMAL LOW (ref 36.0–46.0)
Hemoglobin: 9 g/dL — ABNORMAL LOW (ref 12.0–15.0)
MCH: 31.7 pg (ref 26.0–34.0)
MCHC: 31.7 g/dL (ref 30.0–36.0)
MCV: 100 fL (ref 80.0–100.0)
Platelets: 158 10*3/uL (ref 150–400)
RBC: 2.84 MIL/uL — ABNORMAL LOW (ref 3.87–5.11)
RDW: 14.4 % (ref 11.5–15.5)
WBC: 9.7 10*3/uL (ref 4.0–10.5)
nRBC: 0 % (ref 0.0–0.2)

## 2023-06-27 LAB — MAGNESIUM: Magnesium: 2 mg/dL (ref 1.7–2.4)

## 2023-06-27 LAB — BASIC METABOLIC PANEL
Anion gap: 3 — ABNORMAL LOW (ref 5–15)
BUN: 8 mg/dL (ref 8–23)
CO2: 27 mmol/L (ref 22–32)
Calcium: 6.6 mg/dL — ABNORMAL LOW (ref 8.9–10.3)
Chloride: 103 mmol/L (ref 98–111)
Creatinine, Ser: 0.42 mg/dL — ABNORMAL LOW (ref 0.44–1.00)
GFR, Estimated: >60 mL/min (ref >60)
Glucose, Bld: 128 mg/dL — ABNORMAL HIGH (ref 70–99)
Potassium: 3.3 mmol/L — ABNORMAL LOW (ref 3.5–5.1)
Sodium: 133 mmol/L — ABNORMAL LOW (ref 135–145)

## 2023-06-27 LAB — ALBUMIN: Albumin: <1.5 g/dL — ABNORMAL LOW (ref 3.5–5.0)

## 2023-06-27 MED ORDER — CALCIUM CARBONATE ANTACID 500 MG PO CHEW
1.0000 | CHEWABLE_TABLET | Freq: Three times a day (TID) | ORAL | Status: DC
  Administered 2023-06-27 – 2023-06-28 (×3): 200 mg via ORAL
  Filled 2023-06-27 (×3): qty 1

## 2023-06-27 MED ORDER — BOOST / RESOURCE BREEZE PO LIQD CUSTOM
1.0000 | Freq: Three times a day (TID) | ORAL | Status: DC
  Administered 2023-06-27: 1 via ORAL

## 2023-06-27 MED ORDER — POTASSIUM CHLORIDE CRYS ER 20 MEQ PO TBCR
40.0000 meq | EXTENDED_RELEASE_TABLET | Freq: Once | ORAL | Status: AC
  Administered 2023-06-27: 40 meq via ORAL
  Filled 2023-06-27: qty 2

## 2023-06-27 MED ORDER — SACCHAROMYCES BOULARDII 250 MG PO CAPS
250.0000 mg | ORAL_CAPSULE | Freq: Two times a day (BID) | ORAL | Status: DC
  Administered 2023-06-27 – 2023-06-28 (×3): 250 mg via ORAL
  Filled 2023-06-27 (×3): qty 1

## 2023-06-27 MED ORDER — ADULT MULTIVITAMIN W/MINERALS CH
1.0000 | ORAL_TABLET | Freq: Two times a day (BID) | ORAL | Status: DC
  Administered 2023-06-27 – 2023-06-28 (×2): 1 via ORAL
  Filled 2023-06-27 (×2): qty 1

## 2023-06-27 MED ORDER — SODIUM CHLORIDE 0.9 % IV SOLN
2.0000 g | INTRAVENOUS | Status: DC
  Administered 2023-06-27: 2 g via INTRAVENOUS
  Filled 2023-06-27: qty 20

## 2023-06-27 MED ORDER — PROSOURCE PLUS PO LIQD
30.0000 mL | Freq: Two times a day (BID) | ORAL | Status: DC
  Administered 2023-06-27 – 2023-06-28 (×2): 30 mL via ORAL
  Filled 2023-06-27 (×2): qty 30

## 2023-06-27 NOTE — Progress Notes (Signed)
PROGRESS NOTE   Kayla Dean  WUJ:[id number] DOB: 1951/03/14 DOA: 06/25/2023 PCP: Billie Lade, MD   Chief Complaint  Patient presents with   Arm Injury   Level of care: ICU  Brief Admission History:  72 y.o. 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  This began approximately 2-3 days prior to arrival.  She has had ongoing bilateral lower extremity edema which is a chronic issue.  She denies any acute injuries and states that she has been taking her medications as prescribed.  She recently fractured a finger on her left upper extremity with a recent fall and has a splint.   ED Course: Vital signs with some initial soft blood pressure readings which have responded to fluid bolus.  Initial lactic acid 4.4 which has down trended to 3.  Leukocytosis of 22,900 noted and hemoglobin 11.6.  Potassium 3.2 and sodium 131.  Chest x-ray with no acute findings noted and left forearm x-ray with no acute findings noted.  She was admitted with severe sepsis from severe cellulitis.    Assessment and Plan:  Severe sepsis secondary to severe left upper extremity cellulitis -planning to de-escalate antibiotics on 9/12;  -Follow-up blood cultures - no growth to date -WBC trending down with treatment -Continue aggressive IV fluid with normal saline -weaned off norepinephrine infusion this morning    Mild hypokalemia -Continue to replete and monitor   Hypotension -Exacerbated by sepsis, which initially responded to fluid bolus -Continue fludrocortisone/midodrine -weaned off norepinephrine infusion this morning -transfer to stepdown ICU    History of recent acute PE/right lower extremity DVT -Continue on apixaban for at least 3 months per  hematology   Mild macrocytic anemia -Follows hematology, no signs of blood loss -Recently discontinued oral iron -Continue to monitor CBC   Hypothyroidism -Continue Synthroid   Abdominal pain with questionable chronic mesenteric ischemia -Follows with Dr. Henreitta Leber who does not feel that there is chronic mesenteric ischemia present   Lower extremity wound -Appreciate wound care evaluation -Recommend outpatient wound care management   Obesity -BMI 30.6   DVT prophylaxis: apixaban Code Status: Full  Family Communication:  Disposition: transfer to stepdown ICU   Consultants:   Procedures:   Antimicrobials:  Vancomycin 9/10>> Cefepime 9/10>>  Subjective: Pt reports that she has some abdominal distension.  Pt says she had a BM yesterday.   Objective: Vitals:   06/27/23 0745 06/27/23 0800 06/27/23 0815 06/27/23 0930  BP: 136/80 121/72 125/63 125/72  Pulse: 84 71 71 63  Resp: 18 17 18 20   Temp:      TempSrc:      SpO2: 99% 100% 98% 98%  Weight:      Height:        Intake/Output Summary (Last 24 hours) at 06/27/2023 1119 Last data filed at 06/27/2023 0932 Gross per 24 hour  Intake 992.28 ml  Output --  Net 992.28 ml   Filed Weights   06/25/23 0946 06/25/23 1506 06/26/23 0411  Weight: 67.1 kg 71 kg 75.9 kg   Examination:  General exam: Appears calm and comfortable, sitting up in chair Respiratory system: Clear to auscultation. Respiratory effort normal. Cardiovascular system: normal S1 & S2 heard. No JVD, murmurs, rubs, gallops or clicks. No pedal edema. Gastrointestinal system: Abdomen is mildly distended, soft and nontender. No organomegaly  or masses felt. Normal bowel sounds heard. Central nervous system: Alert and oriented. No focal neurological deficits. Extremities: chronic venous stasis bilateral LEs. Erythema and edema of LUE, no drainage seen, no fluctuance appreciated.  Skin: warmth and erythema of LUE.  Psychiatry: Judgement and insight appear  normal. Mood & affect appropriate.   Data Reviewed: I have personally reviewed following labs and imaging studies  CBC: Recent Labs  Lab 06/25/23 1029 06/26/23 0435 06/27/23 0432  WBC 22.9* 23.4* 9.7  NEUTROABS 19.9*  --   --   HGB 11.6* 10.2* 9.0*  HCT 36.1 31.6* 28.4*  MCV 99.7 99.7 100.0  PLT 225 215 158    Basic Metabolic Panel: Recent Labs  Lab 06/25/23 1029 06/26/23 0435 06/27/23 0432  NA 131* 135 133*  K 3.2* 3.3* 3.3*  CL 98 105 103  CO2 25 24 27   GLUCOSE 125* 95 128*  BUN 14 11 8   CREATININE 0.61 0.45 0.42*  CALCIUM 6.6* 6.7* 6.6*  MG  --  1.7 2.0    CBG: No results for input(s): "GLUCAP" in the last 168 hours.  Recent Results (from the past 240 hour(s))  Blood Culture (routine x 2)     Status: None (Preliminary result)   Collection Time: 06/25/23 10:25 AM   Specimen: Right Antecubital; Blood  Result Value Ref Range Status   Specimen Description RIGHT ANTECUBITAL  Final   Special Requests   Final    BOTTLES DRAWN AEROBIC AND ANAEROBIC Blood Culture results may not be optimal due to an excessive volume of blood received in culture bottles   Culture   Final    NO GROWTH 2 DAYS Performed at [facility], [address], [place], [place] [address]    Report Status PENDING  Incomplete  Blood Culture (routine x 2)     Status: None (Preliminary result)   Collection Time: 06/25/23 10:35 AM   Specimen: Right Antecubital; Blood  Result Value Ref Range Status   Specimen Description RIGHT ANTECUBITAL  Final   Special Requests   Final    BOTTLES DRAWN AEROBIC AND ANAEROBIC Blood Culture results may not be optimal due to an inadequate volume of blood received in culture bottles   Culture   Final    NO GROWTH 2 DAYS Performed at [facility], [address] [place], [place] [address]    Report Status PENDING  Incomplete  MRSA Next Gen by PCR, Nasal     Status: None   Collection Time: 06/25/23  3:15 PM   Specimen: Nasal Mucosa; Nasal Swab  Result Value Ref  Range Status   MRSA by PCR Next Gen NOT DETECTED NOT DETECTED Final    Comment: (NOTE) The GeneXpert MRSA Assay (FDA approved for NASAL specimens only), is one component of a comprehensive MRSA colonization surveillance program. It is not intended to diagnose MRSA infection nor to guide or monitor treatment for MRSA infections. Test performance is not FDA approved in patients less than 20 years old. Performed at [facility], [address], [place], [place] [address]      Radiology Studies: [facility] 1 View  Result Date: 06/26/2023 CLINICAL DATA:  PICC placement. EXAM: PORTABLE CHEST 1 VIEW COMPARISON:  Chest radiograph 06/25/2023 FINDINGS: A right upper extremity PICC has been placed in terminates over the lower SVC. The cardiomediastinal silhouette is unchanged with normal heart size. There are unchanged small bilateral pleural effusions. Bibasilar lung opacities are mildly increased. No pneumothorax is identified. No acute osseous abnormality is seen.  IMPRESSION: 1. PICC placement as above. 2. Unchanged small bilateral pleural effusions. 3. Increased bibasilar atelectasis or consolidation. Electronically Signed   By: Sebastian Ache M.D.   On: 06/26/2023 19:38   [place] EKG SITE RITE  Result Date: 06/26/2023 If Site Rite image not attached, placement could not be confirmed due to current cardiac rhythm.   Scheduled Meds:  apixaban  5 mg Oral BID   Chlorhexidine Gluconate Cloth  6 each Topical Daily   fludrocortisone  0.1 mg Oral BID   levothyroxine  137 mcg Oral QAC breakfast   midodrine  10 mg Oral TID WC   sodium chloride flush  10-40 mL Intracatheter Q12H   sodium chloride flush  10-40 mL Intracatheter Q12H   Continuous Infusions:  ceFEPime (MAXIPIME) IV 2 g (06/27/23 0917)   norepinephrine (LEVOPHED) Adult infusion 2 mcg/min (06/27/23 0600)   vancomycin Stopped (06/26/23 1438)    LOS: 2 days   Critical Care Procedure Note Authorized and Performed by: Maryln Manuel MD  Total  Critical Care time:  43 mins  Due to a high probability of clinically significant, life threatening deterioration, the patient required my highest level of preparedness to intervene emergently and I personally spent this critical care time directly and personally managing the patient.  This critical care time included obtaining a history; examining the patient, pulse oximetry; ordering and review of studies; arranging urgent treatment with development of a management plan; evaluation of patient's response of treatment; frequent reassessment; and discussions with other providers.  This critical care time was performed to assess and manage the high probability of imminent and life threatening deterioration that could result in multi-organ failure.  It was exclusive of separately billable procedures and treating other patients and teaching time.   Standley Dakins, MD How to contact the [facility] Attending or Consulting provider 7A - 7P or covering provider during after hours 7P -7A, for this patient?  Check the care team in [facility] and look for a) attending/consulting TRH provider listed and b) the [facility] team listed Log into [email or web address] and use [facility]'s universal password to access. If you do not have the password, please contact the hospital operator. Locate the [facility] provider you are looking for under [facility] and page to a number that you can be directly reached. If you still have difficulty reaching the provider, please page the [facility] (Director on Call) for the Hospitalists listed on amion for assistance.  06/27/2023, 11:19 AM

## 2023-06-27 NOTE — Evaluation (Signed)
Physical Therapy Evaluation Patient Details Name: Kayla Dean MRN: [id number] DOB: 29-Nov-1950 Today's Date: 06/27/2023  History of Present Illness  Kayla Dean is a 72 y.o. 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  This began approximately 2-3 days ago.  She has had ongoing bilateral lower extremity edema which is a chronic issue.  She denies any acute injuries and states that she has been taking her medications as prescribed.  She recently fractured a finger on her left upper extremity with a recent fall and has a splint.   Clinical Impression  Patient demonstrates good return for sitting up at bedside with c/o mild discomfort/pain LUE and able to ambulate in room/hallway without loss of balance using RW.  Patient tolerated sitting up in chair after therapy.  Patient will benefit from continued skilled physical therapy in hospital and recommended venue below to increase strength, balance, endurance for safe ADLs and gait.          If plan is discharge home, recommend the following: A little help with walking and/or transfers;A little help with bathing/dressing/bathroom;Help with stairs or ramp for entrance;Assistance with cooking/housework   Can travel by private vehicle        Equipment Recommendations None recommended by PT  Recommendations for Other Services       Functional Status Assessment Patient has had a recent decline in their functional status and demonstrates the ability to make significant improvements in function in a reasonable and predictable amount of time.     Precautions / Restrictions Precautions Precautions: Fall Restrictions Weight Bearing Restrictions: No      Mobility  Bed Mobility Overal bed  mobility: Needs Assistance Bed Mobility: Supine to Sit     Supine to sit: Supervision     General bed mobility comments: slightly labored movement    Transfers Overall transfer level: Needs assistance Equipment used: Rolling walker (2 wheels) Transfers: Sit to/from Stand, Bed to chair/wheelchair/BSC Sit to Stand: Supervision, Contact guard assist   Step pivot transfers: Supervision, Contact guard assist       General transfer comment: slightly labored movement    Ambulation/Gait Ambulation/Gait assistance: Supervision Gait Distance (Feet): 100 Feet Assistive device: Rolling walker (2 wheels) Gait Pattern/deviations: Decreased step length - right, Decreased step length - left, Decreased stride length Gait velocity: decreased     General Gait Details: slightly labored movement without loss of balance with good return for ambulating in room, hallway without loss of balance  Stairs            Wheelchair Mobility     Tilt Bed    Modified Rankin (Stroke Patients Only)       Balance Overall balance assessment: Needs assistance Sitting-balance support: Feet supported, No upper extremity supported Sitting balance-Leahy Scale: Good Sitting balance - Comments: seated at EOB   Standing balance support: During functional activity, Bilateral upper extremity supported Standing balance-Leahy Scale: Fair Standing balance comment: fair/good using RW                             Pertinent Vitals/Pain Pain Assessment Pain Assessment: Faces Faces Pain Scale: Hurts a little bit Pain Location: LUE Pain Descriptors / Indicators: Sore, Discomfort Pain Intervention(s): Limited activity within patient's tolerance, Monitored during session, Repositioned  Home Living Family/patient expects to be discharged to:: Private residence Living Arrangements: Alone Available Help at Discharge: Family;Available 24 hours/day Type of Home: House Home Access: Level entry        Home Layout: One level Home Equipment: Wheelchair - Forensic psychologist (2 wheels);Cane - single point;Shower seat      Prior Function Prior Level of Function : Independent/Modified Independent;Driving             Mobility Comments: Household and short distanced community ambulator using SPC PRN ADLs Comments: Independent     Extremity/Trunk Assessment   Upper Extremity Assessment Upper Extremity Assessment: Overall WFL for tasks assessed;LUE deficits/detail LUE Deficits / Details: grossly -4/5 LUE: Unable to fully assess due to pain LUE Sensation: WNL LUE Coordination: WNL    Lower Extremity Assessment Lower Extremity Assessment: Generalized weakness    Cervical / Trunk Assessment Cervical / Trunk Assessment: Normal  Communication   Communication Communication: No apparent difficulties  Cognition Arousal: Alert Behavior During Therapy: WFL for tasks assessed/performed Overall Cognitive Status: Within Functional Limits for tasks assessed                                          General Comments      Exercises     Assessment/Plan    PT Assessment Patient needs continued PT services  PT Problem List Decreased strength;Decreased activity tolerance;Decreased balance;Decreased mobility       PT Treatment Interventions DME instruction;Gait training;Stair training;Functional mobility training;Therapeutic activities;Therapeutic exercise;Balance training;Patient/family education    PT Goals (Current goals can be found in the Care Plan section)  Acute Rehab PT Goals Patient Stated Goal: return home with family to assist PT Goal Formulation: With patient Time For Goal Achievement: 07/04/23 Potential to Achieve Goals: Good    Frequency Min 3X/week     Co-evaluation               AM-PAC PT "6 Clicks" Mobility  Outcome Measure Help needed turning from your back to your side while in a flat bed without using bedrails?: None Help  needed moving from lying on your back to sitting on the side of a flat bed without using bedrails?: None Help needed moving to and from a bed to a chair (including a wheelchair)?: A Little Help needed standing up from a chair using your arms (e.g., wheelchair or bedside chair)?: A Little Help needed to walk in hospital room?: A Little Help needed climbing 3-5 steps with a railing? : A Little 6 Click Score: 20    End of Session   Activity Tolerance: Patient tolerated treatment well;Patient limited by fatigue Patient left: in chair;with call bell/phone within reach Nurse Communication: Mobility status PT Visit Diagnosis: Unsteadiness on feet (R26.81);Other abnormalities of gait and mobility (R26.89);Muscle weakness (generalized) (M62.81)    Time: 2440-1027 PT Time Calculation (min) (ACUTE ONLY): 20 min   Charges:   PT Evaluation $PT Eval Moderate Complexity: 1 Mod PT Treatments $Therapeutic Exercise: 8-22 mins PT General Charges $$ ACUTE PT VISIT: 1 Visit         3:52 PM, 06/27/23 Ocie Bob, MPT Physical Therapist with [facility] 336 [phone] office [phone] mobile phone

## 2023-06-27 NOTE — Hospital Course (Signed)
72 y.o. 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  This began approximately 2-3 days prior to arrival.  She has had ongoing bilateral lower extremity edema which is a chronic issue.  She denies any acute injuries and states that she has been taking her medications as prescribed.  She recently fractured a finger on her left upper extremity with a recent fall and has a splint.   ED Course: Vital signs with some initial soft blood pressure readings which have responded to fluid bolus.  Initial lactic acid 4.4 which has down trended to 3.  Leukocytosis of 22,900 noted and hemoglobin 11.6.  Potassium 3.2 and sodium 131.  Chest x-ray with no acute findings noted and left forearm x-ray with no acute findings noted.  She was admitted with severe sepsis from severe cellulitis.

## 2023-06-27 NOTE — Plan of Care (Signed)
  Problem: Acute Rehab PT Goals(only PT should resolve) Goal: Pt Will Go Supine/Side To Sit Outcome: Progressing Flowsheets (Taken 06/27/2023 1553) Pt will go Supine/Side to Sit:  with modified independence  with supervision Goal: Patient Will Transfer Sit To/From Stand Outcome: Progressing Flowsheets (Taken 06/27/2023 1553) Patient will transfer sit to/from stand:  with modified independence  with supervision Goal: Pt Will Transfer Bed To Chair/Chair To Bed Outcome: Progressing Flowsheets (Taken 06/27/2023 1553) Pt will Transfer Bed to Chair/Chair to Bed:  with modified independence  with supervision Goal: Pt Will Ambulate Outcome: Progressing Flowsheets (Taken 06/27/2023 1553) Pt will Ambulate:  > 125 feet  with modified independence  with least restrictive assistive device  with rolling walker  with cane   3:54 PM, 06/27/23 Ocie Bob, MPT Physical Therapist with [facility] [phone] office [phone] mobile phone

## 2023-06-27 NOTE — Plan of Care (Signed)

## 2023-06-27 NOTE — Progress Notes (Signed)
Initial Nutrition Assessment  DOCUMENTATION CODES:   Severe malnutrition in context of chronic illness  INTERVENTION:   Boost Breeze po TID, each supplement provides 250 kcal and 9 grams of protein Prosource Plus 30 ml PO BID, each packet provides 100 kcal and 15 gm protein MVI with minerals BID (due to hx gastric bypass) Calcium supplement (TUMS) TID (due to hx gastric bypass)  NUTRITION DIAGNOSIS:   Moderate Malnutrition related to chronic illness as evidenced by moderate muscle depletion, severe muscle depletion, moderate fat depletion.  GOAL:   Patient will meet greater than or equal to 90% of their needs  MONITOR:   PO intake, Supplement acceptance, Skin  REASON FOR ASSESSMENT:   Consult Assessment of nutrition requirement/status  ASSESSMENT:   72 yo female admitted with severe sepsis LUE d/t cellulitis. PMH includes hiatal hernia, SBO, gastric bypass, hypothyroidism, aortic aneurysm.  Spoke with patient and her daughter at bedside. They report that patient has been eating a regular diet at home, consuming small, frequent meals and snacks 5-6 times per day. She has had a few setbacks requiring recent hospitalization. Daughter feels she is not eating/drinking enough protein at home. Patient states she is tired of the protein shakes she has been drinking at home [employer], [place]). She likes the berry flavored Boost Breeze, but it is too expensive and too hard to get. She likes [employer]; daughter to purchase for patient after discharge.   Currently on a regular diet; meal intakes 40-75%  Labs reviewed. Na 133, K 3.3  Medications reviewed and include florastor, KCl, IV antibiotics. Levophed has been discontinued.  Weight hx reviewed. Recent increase in weight is related to edema from cellulitis. Unable to determine actual dry weight changes, but suspect she has lost weight.   NUTRITION - FOCUSED PHYSICAL EXAM:  Flowsheet Row Most Recent Value   Orbital Region Moderate depletion  Upper Arm Region Moderate depletion  Thoracic and Lumbar Region Moderate depletion  Buccal Region Moderate depletion  Temple Region Severe depletion  Clavicle Bone Region Severe depletion  Clavicle and Acromion Bone Region Severe depletion  Scapular Bone Region Moderate depletion  Dorsal Hand Moderate depletion  Patellar Region No depletion  Anterior Thigh Region No depletion  Posterior Calf Region Unable to assess  Edema (RD Assessment) Severe  Hair Reviewed  Eyes Reviewed  Mouth Reviewed  Skin Reviewed  Nails Reviewed       Diet Order:   Diet Order             Diet regular Room service appropriate? Yes; Fluid consistency: Thin  Diet effective now                   EDUCATION NEEDS:   Education needs have been addressed  Skin:  Skin Assessment: Skin Integrity Issues: Skin Integrity Issues:: Other (Comment) Other: L pretibial wound, cellulitis LUE  Last BM:  9/10 type 5  Height:   Ht Readings from Last 1 Encounters:  06/26/23 5\' 2"  (1.575 m)    Weight:   Wt Readings from Last 1 Encounters:  06/26/23 75.9 kg    Ideal Body Weight:  50 kg  BMI:  Body mass index is 30.6 kg/m.  Estimated Nutritional Needs:   Kcal:  1700-1900  Protein:  90-110 gm  Fluid:  >/= 1.7 L   Gabriel Rainwater RD, LDN, CNSC Please refer to Amion for contact information.

## 2023-06-27 NOTE — Progress Notes (Addendum)
[facility]  Checked on patient socially. This is not a billable encounter.   Admitted with LUE cellulitis and swelling. On antibiotics.   Her abdominal pain is better and she is having Bms.   Patient s/p Ex lap, primary repair of ventral hernia. After this episode of incarceration her intestines have been edematous and have not fully recovered. She is overall improving from an abdominal standpoint and hopefully will not need revision in the future.    Continue diet.  I am suppose to see her next week. I asked dietitian about prosource to help with protein too. I am worried her extremity swelling exacerbated by her low albumin. I think PT to keep her strong while in hospital would be helpful.  Sent Dr. Laural Benes a message.  I can still follow up with her next week as she has had her PCP appt with Dr. Durwin Nora rescheduled for November now.  Future Appointments  Date Time Provider Department Center  07/03/2023 11:45 AM Lucretia Roers, MD RS-RS None  07/04/2023  8:20 AM Darreld Mclean, MD OCR-OCR None  07/26/2023 10:15 AM AP-ACAPA LAB [facility] None  08/02/2023  9:30 AM Cindie Crumbly, MD [facility]-APCC None  08/17/2023  4:00 PM Billie Lade, MD RPC-RPC RPC     Algis Greenhouse, MD [facility] [address] Vella Raring [place], [place] [address] [phone] (office)

## 2023-06-27 NOTE — TOC Progression Note (Signed)
Transition of Care [facility]) - Progression Note    Patient Details  Name: STEFANEE GRIGAS MRN: [id number] Date of Birth: 07-21-1951  [facility]) CM/SW Contact  Villa Herb, [place] Phone Number: 06/27/2023, 3:30 PM  Clinical Narrative:    CSW updated that PT is recommending HH PT. Pt has been active with [facility] in the past. CSW spoke with pts daughter Marcelino Duster who states that they would like to use [facility] again. TOC to make referral. TOC to follow.   Expected Discharge Plan: Home w Home Health Services Barriers to Discharge: Continued Medical Work up  Expected Discharge Plan and Services In-house Referral: Clinical Social Work     Living arrangements for the past 2 months: Single Family Home                                       Social Determinants of Health (SDOH) Interventions SDOH Screenings   Food Insecurity: No Food Insecurity (06/25/2023)  Housing: Low Risk  (06/25/2023)  Transportation Needs: No Transportation Needs (06/25/2023)  Utilities: Not At Risk (06/25/2023)  Tobacco Use: Low Risk  (06/25/2023)    Readmission Risk Interventions    06/26/2023    2:18 PM 04/26/2023   12:09 PM  Readmission Risk Prevention Plan  Transportation Screening Complete Complete  PCP or Specialist Appt within 3-5 Days  Complete  HRI or Home Care Consult Complete Complete  Social Work Consult for Recovery Care Planning/Counseling Complete Complete  Palliative Care Screening Not Applicable Not Applicable  Medication Review Oceanographer) Complete Complete

## 2023-06-28 DIAGNOSIS — I959 Hypotension, unspecified: Secondary | ICD-10-CM

## 2023-06-28 DIAGNOSIS — E43 Unspecified severe protein-calorie malnutrition: Secondary | ICD-10-CM | POA: Diagnosis not present

## 2023-06-28 DIAGNOSIS — R600 Localized edema: Secondary | ICD-10-CM | POA: Diagnosis not present

## 2023-06-28 DIAGNOSIS — A419 Sepsis, unspecified organism: Secondary | ICD-10-CM | POA: Diagnosis not present

## 2023-06-28 LAB — COMPREHENSIVE METABOLIC PANEL
ALT: 14 U/L (ref 0–44)
AST: 14 U/L — ABNORMAL LOW (ref 15–41)
Albumin: <1.5 g/dL — ABNORMAL LOW (ref 3.5–5.0)
Alkaline Phosphatase: 69 U/L (ref 38–126)
Anion gap: 5 (ref 5–15)
BUN: 8 mg/dL (ref 8–23)
CO2: 26 mmol/L (ref 22–32)
Calcium: 6.8 mg/dL — ABNORMAL LOW (ref 8.9–10.3)
Chloride: 104 mmol/L (ref 98–111)
Creatinine, Ser: 0.37 mg/dL — ABNORMAL LOW (ref 0.44–1.00)
GFR, Estimated: >60 mL/min (ref >60)
Glucose, Bld: 79 mg/dL (ref 70–99)
Potassium: 3.1 mmol/L — ABNORMAL LOW (ref 3.5–5.1)
Sodium: 135 mmol/L (ref 135–145)
Total Bilirubin: 0.2 mg/dL — ABNORMAL LOW (ref 0.3–1.2)
Total Protein: 3.2 g/dL — ABNORMAL LOW (ref 6.5–8.1)

## 2023-06-28 LAB — MAGNESIUM: Magnesium: 1.9 mg/dL (ref 1.7–2.4)

## 2023-06-28 MED ORDER — POTASSIUM CHLORIDE CRYS ER 10 MEQ PO TBCR: 10.0000 meq | EXTENDED_RELEASE_TABLET | Freq: Every day | ORAL | 0 refills | Status: DC

## 2023-06-28 MED ORDER — MAGNESIUM SULFATE 2 GM/50ML IV SOLN
2.0000 g | Freq: Once | INTRAVENOUS | Status: AC
  Administered 2023-06-28: 2 g via INTRAVENOUS
  Filled 2023-06-28: qty 50

## 2023-06-28 MED ORDER — MIDODRINE HCL 10 MG PO TABS: 10.0000 mg | ORAL_TABLET | Freq: Three times a day (TID) | ORAL | 0 refills | Status: DC

## 2023-06-28 MED ORDER — POTASSIUM CHLORIDE CRYS ER 20 MEQ PO TBCR
60.0000 meq | EXTENDED_RELEASE_TABLET | Freq: Once | ORAL | Status: AC
  Administered 2023-06-28: 60 meq via ORAL
  Filled 2023-06-28: qty 3

## 2023-06-28 MED ORDER — SACCHAROMYCES BOULARDII 250 MG PO CAPS
250.0000 mg | ORAL_CAPSULE | Freq: Two times a day (BID) | ORAL | 0 refills | Status: AC
Start: 2023-06-28 — End: ?

## 2023-06-28 MED ORDER — PROSOURCE PLUS PO LIQD: 30.0000 mL | Freq: Two times a day (BID) | ORAL | 1 refills | Status: DC

## 2023-06-28 MED ORDER — DOXYCYCLINE HYCLATE 100 MG PO CAPS: 100.0000 mg | ORAL_CAPSULE | Freq: Two times a day (BID) | ORAL | 0 refills | Status: AC

## 2023-06-28 NOTE — Care Management Important Message (Signed)
Important Message  Patient Details  Name: Kayla Dean MRN: [id number] Date of Birth: [birth year]   Medicare Important Message Given:  Yes     Corey Harold 06/28/2023, 2:37 PM

## 2023-06-28 NOTE — Discharge Instructions (Signed)
IMPORTANT INFORMATION: PAY CLOSE ATTENTION   PHYSICIAN DISCHARGE INSTRUCTIONS  Follow with Primary care provider  Kayla Abraham, MD  and other consultants as instructed by your Hospitalist Physician  Quentin IF SYMPTOMS COME BACK, WORSEN OR NEW PROBLEM DEVELOPS   Please note: You were cared for by a hospitalist during your hospital stay. Every effort will be made to forward records to your primary care provider.  You can request that your primary care provider send for your hospital records if they have not received them.  Once you are discharged, your primary care physician will handle any further medical issues. Please note that NO REFILLS for any discharge medications will be authorized once you are discharged, as it is imperative that you return to your primary care physician (or establish a relationship with a primary care physician if you do not have one) for your post hospital discharge needs so that they can reassess your need for medications and monitor your lab values.  Please get a complete blood count and chemistry panel checked by your Primary MD at your next visit, and again as instructed by your Primary MD.  Get Medicines reviewed and adjusted: Please take all your medications with you for your next visit with your Primary MD  Laboratory/radiological data: Please request your Primary MD to go over all hospital tests and procedure/radiological results at the follow up, please ask your primary care provider to get all Hospital records sent to his/her office.  In some cases, they will be blood work, cultures and biopsy results pending at the time of your discharge. Please request that your primary care provider follow up on these results.  If you are diabetic, please bring your blood sugar readings with you to your follow up appointment with primary care.    Please call and make your follow up appointments as soon as possible.    Also Note  the following: If you experience worsening of your admission symptoms, develop shortness of breath, life threatening emergency, suicidal or homicidal thoughts you must seek medical attention immediately by calling 911 or calling your MD immediately  if symptoms less severe.  You must read complete instructions/literature along with all the possible adverse reactions/side effects for all the Medicines you take and that have been prescribed to you. Take any new Medicines after you have completely understood and accpet all the possible adverse reactions/side effects.   Do not drive when taking Pain medications or sleeping medications (Benzodiazepines)  Do not take more than prescribed Pain, Sleep and Anxiety Medications. It is not advisable to combine anxiety,sleep and pain medications without talking with your primary care practitioner  Special Instructions: If you have smoked or chewed Tobacco  in the last 2 yrs please stop smoking, stop any regular Alcohol  and or any Recreational drug use.  Wear Seat belts while driving.  Do not drive if taking any narcotic, mind altering or controlled substances or recreational drugs or alcohol.

## 2023-06-28 NOTE — TOC Transition Note (Signed)
Transition of Care [facility]) - CM/SW Discharge Note   Patient Details  Name: Kayla Dean MRN: [id number] Date of Birth: [birth year]  [facility] CM/SW Contact:  Elliot Gault, LCSW Phone Number: 06/28/2023, 1:13 PM   Clinical Narrative:     Pt medically stable for dc today per MD. Updated Kandee Keen at [place] and they will follow at home.  No other TOC needs identified for dc.  Final next level of care: Home w Home Health Services Barriers to Discharge: Barriers Resolved   Patient Goals and CMS Choice CMS Medicare.gov Compare Post Acute Care list provided to:: Patient Represenative (must comment) Choice offered to / list presented to : Adult Children  Discharge Placement                         Discharge Plan and Services Additional resources added to the After Visit Summary for   In-house Referral: Clinical Social Work                        HH Arranged: PT [facility] Agency: [facility] Date [facility] Agency Contacted: 06/28/23   Representative spoke with at [facility] Agency: Kandee Keen  Social Determinants of Health (SDOH) Interventions SDOH Screenings   Food Insecurity: No Food Insecurity (06/25/2023)  Housing: Low Risk  (06/25/2023)  Transportation Needs: No Transportation Needs (06/25/2023)  Utilities: Not At Risk (06/25/2023)  Tobacco Use: Low Risk  (06/25/2023)     Readmission Risk Interventions    06/26/2023    2:18 PM 04/26/2023   12:09 PM  Readmission Risk Prevention Plan  Transportation Screening Complete Complete  PCP or Specialist Appt within 3-5 Days  Complete  HRI or Home Care Consult Complete Complete  Social Work Consult for Recovery Care Planning/Counseling Complete Complete  Palliative Care Screening Not Applicable Not Applicable  Medication Review Oceanographer) Complete Complete

## 2023-06-28 NOTE — Discharge Summary (Signed)
Physician Discharge Summary  Kayla Dean ZOX:[id number] DOB: 1951-04-03 DOA: 06/25/2023  PCP: Billie Lade, MD  Admit date: 06/25/2023 Discharge date: 06/28/2023  Admitted From:  HOME  Disposition: HOME   Recommendations for Outpatient Follow-up:  Follow up with PCP in 1 weeks Follow up with surgery as scheduled  Follow up with ortho as scheduled  Please recheck BMP in 1-2 weeks to follow up potassium   Home Health: Physical Therapy   Discharge Condition: STABLE   CODE STATUS: FULL  DIET: heart healthy low sodium    Brief Hospitalization Summary: Please see all hospital notes, images, labs for full details of the hospitalization. Admission provider HPI:  72 y.o. 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  This began approximately 2-3 days prior to arrival.  She has had ongoing bilateral lower extremity edema which is a chronic issue.  She denies any acute injuries and states that she has been taking her medications as prescribed.  She recently fractured a finger on her left upper extremity with a recent fall and has a splint.   ED Course: Vital signs with some initial soft blood pressure readings which have responded to fluid bolus.  Initial lactic acid 4.4 which has down trended to 3.  Leukocytosis of 22,900 noted and hemoglobin 11.6.  Potassium 3.2 and sodium 131.  Chest x-ray with no acute findings noted and left forearm x-ray with no acute findings noted.  She was admitted with severe sepsis from severe cellulitis.   Hospital Course by Problem list   Severe sepsis secondary to severe left upper extremity cellulitis -de-escalated antibiotics on 9/11;  -Follow-up blood cultures - no growth to date -WBC trended down with  treatment -weaned off norepinephrine infusion  -sepsis physiology resolved     Mild hypokalemia -repleted    Chronic Hypotension -Exacerbated by sepsis -Continue fludrocortisone/midodrine as BPs have remained stable on this regimen -weaned off norepinephrine infusion -transferred out of stepdown ICU    History of recent acute PE/right lower extremity DVT -Continue on apixaban for at least 3 months per hematology   Mild macrocytic anemia -Follows hematology, no signs of blood loss -Recently discontinued oral iron in the outpatient setting   Hypothyroidism -Continue Synthroid   Abdominal pain with questionable chronic mesenteric ischemia -Follows with Dr. Henreitta Leber who does not feel that there is chronic mesenteric ischemia present -pt has outpatient follow up with Dr. Henreitta Leber  Lower extremity wound -Appreciate wound care evaluation -Recommend outpatient wound care management   Obesity -BMI 30.6   Discharge Diagnoses:  Principal Problem:   Severe sepsis (HCC) Active Problems:   Acquired hypothyroidism   Gastroesophageal reflux disease   H/O gastric bypass   Bilateral lower extremity edema   Peripheral edema   Hypotension   Pulmonary embolism (HCC)   Anemia   Protein-calorie malnutrition, severe   Discharge Instructions:  Allergies as of 06/28/2023   No Known Allergies      Medication List     TAKE these medications    (feeding supplement) PROSource Plus liquid Take 30 mLs by mouth 2 (two) times daily between meals.   Abdominal Binder/Elastic Med Misc Apply as directed for orthostatic hypotension   acetaminophen 500 MG tablet Commonly known as: TYLENOL Take 1,000 mg by mouth every 8 (eight) hours as needed for moderate  pain.   apixaban 5 MG Tabs tablet Commonly known as: ELIQUIS Take 1 tablet (5 mg total) by mouth 2 (two) times daily.   Calcium 600 + D 600-200 MG-UNIT Tabs Generic drug: Calcium Carb-Cholecalciferol Take 1 tablet by mouth every  morning.   doxycycline 100 MG capsule Commonly known as: VIBRAMYCIN Take 1 capsule (100 mg total) by mouth 2 (two) times daily for 5 days. Start taking on: June 29, 2023   fludrocortisone 0.1 MG tablet Commonly known as: FLORINEF Take 1 tablet (0.1 mg total) by mouth daily.   furosemide 20 MG tablet Commonly known as: LASIX Take 1 tablet (20 mg total) by mouth daily.   levothyroxine 137 MCG tablet Commonly known as: SYNTHROID Take 137 mcg by mouth daily before breakfast.   midodrine 10 MG tablet Commonly known as: PROAMATINE Take 1 tablet (10 mg total) by mouth 3 (three) times daily with meals.   multivitamin with minerals Tabs tablet Take 1 tablet by mouth every morning.   ondansetron 4 MG disintegrating tablet Commonly known as: ZOFRAN-ODT Take 1 tablet (4 mg total) by mouth every 6 (six) hours as needed for nausea.   oxyCODONE 5 MG immediate release tablet Commonly known as: Oxy IR/ROXICODONE Take 1 tablet (5 mg total) by mouth every 4 (four) hours as needed for severe pain or breakthrough pain.   potassium chloride 10 MEQ tablet Commonly known as: KLOR-CON M Take 1 tablet (10 mEq total) by mouth daily. Start taking on: June 29, 2023   saccharomyces boulardii 250 MG capsule Commonly known as: FLORASTOR Take 1 capsule (250 mg total) by mouth 2 (two) times daily.   VITAMIN D-3 PO Take 1 capsule by mouth daily.   zolpidem 5 MG tablet Commonly known as: Ambien Take 1 tablet (5 mg total) by mouth at bedtime as needed for sleep.        Follow-up Information     Billie Lade, MD. Schedule an appointment as soon as possible for a visit in 1 week(s).   Specialty: Internal Medicine Why: Hospital Follow Up Contact information: [address] [place] [address] [phone]         Lucretia Roers, MD. Go on 07/03/2023.   Specialty: General Surgery Contact information: [address] [place] [facility] [address] [phone]          Darreld Mclean, MD. Go on 07/04/2023.   Specialty: Orthopedic Surgery Contact information: [address] [place] [address] [phone]                No Known Allergies Allergies as of 06/28/2023   No Known Allergies      Medication List     TAKE these medications    (feeding supplement) PROSource Plus liquid Take 30 mLs by mouth 2 (two) times daily between meals.   Abdominal Binder/Elastic Med Misc Apply as directed for orthostatic hypotension   acetaminophen 500 MG tablet Commonly known as: TYLENOL Take 1,000 mg by mouth every 8 (eight) hours as needed for moderate pain.   apixaban 5 MG Tabs tablet Commonly known as: ELIQUIS Take 1 tablet (5 mg total) by mouth 2 (two) times daily.   Calcium 600 + D 600-200 MG-UNIT Tabs Generic drug: Calcium Carb-Cholecalciferol Take 1 tablet by mouth every morning.   doxycycline 100 MG capsule Commonly known as: VIBRAMYCIN Take 1 capsule (100 mg total) by mouth 2 (two) times daily for 5 days. Start taking on: June 29, 2023   fludrocortisone 0.1 MG  tablet Commonly known as: FLORINEF Take 1 tablet (0.1 mg total) by mouth daily.   furosemide 20 MG tablet Commonly known as: LASIX Take 1 tablet (20 mg total) by mouth daily.   levothyroxine 137 MCG tablet Commonly known as: SYNTHROID Take 137 mcg by mouth daily before breakfast.   midodrine 10 MG tablet Commonly known as: PROAMATINE Take 1 tablet (10 mg total) by mouth 3 (three) times daily with meals.   multivitamin with minerals Tabs tablet Take 1 tablet by mouth every morning.   ondansetron 4 MG disintegrating tablet Commonly known as: ZOFRAN-ODT Take 1 tablet (4 mg total) by mouth every 6 (six) hours as needed for nausea.   oxyCODONE 5 MG immediate release tablet Commonly known as: Oxy IR/ROXICODONE Take 1 tablet (5 mg total) by mouth every 4 (four) hours as needed for severe pain or breakthrough pain.   potassium chloride 10 MEQ  tablet Commonly known as: KLOR-CON M Take 1 tablet (10 mEq total) by mouth daily. Start taking on: June 29, 2023   saccharomyces boulardii 250 MG capsule Commonly known as: FLORASTOR Take 1 capsule (250 mg total) by mouth 2 (two) times daily.   VITAMIN D-3 PO Take 1 capsule by mouth daily.   zolpidem 5 MG tablet Commonly known as: Ambien Take 1 tablet (5 mg total) by mouth at bedtime as needed for sleep.       Procedures/Studies: DG Chest Port 1 View  Result Date: 06/26/2023 CLINICAL DATA:  PICC placement. EXAM: PORTABLE CHEST 1 VIEW COMPARISON:  Chest radiograph 06/25/2023 FINDINGS: A right upper extremity PICC has been placed in terminates over the lower SVC. The cardiomediastinal silhouette is unchanged with normal heart size. There are unchanged small bilateral pleural effusions. Bibasilar lung opacities are mildly increased. No pneumothorax is identified. No acute osseous abnormality is seen. IMPRESSION: 1. PICC placement as above. 2. Unchanged small bilateral pleural effusions. 3. Increased bibasilar atelectasis or consolidation. Electronically Signed   By: Sebastian Ache M.D.   On: 06/26/2023 19:38   [place] EKG SITE RITE  Result Date: 06/26/2023 If Site Rite image not attached, placement could not be confirmed due to current cardiac rhythm.  DG Chest Port 1 View  Result Date: 06/25/2023 CLINICAL DATA:  Left arm pain/redness, swelling. Questionable sepsis. EXAM: PORTABLE CHEST 1 VIEW COMPARISON:  Chest radiograph 04/12/2023 FINDINGS: The cardiomediastinal silhouette is normal There are small bilateral pleural effusions with minimal adjacent atelectasis. There is no other focal airspace opacity. There is no pneumothorax There is no acute osseous abnormality. IMPRESSION: Mild bilateral pleural effusions with minimal adjacent atelectasis. Electronically Signed   By: Lesia Hausen M.D.   On: 06/25/2023 11:28   DG Forearm Left  Result Date: 06/25/2023 CLINICAL DATA:  Edema, swelling  and redness beginning 3 days ago. EXAM: LEFT FOREARM - 2 VIEW COMPARISON:  None Available. FINDINGS: No sign of foreign object.  No abnormal bone or joint finding. IMPRESSION: Negative. Electronically Signed   By: Paulina Fusi M.D.   On: 06/25/2023 11:27   DG Finger Ring Left  Result Date: 06/12/2023 Clinical:  fracture of left ring finger X-rays were done of the left ring finger, three views. There is a nondisplaced fracture of the proximal phalanx of the left ring finger, more laterally, nondisplaced.  Bone quality is good. Impression:  nondisplaced fracture of the proximal phalanx of the left ring finger. Electronically Signed Darreld Mclean, MD 8/27/20242:04 PM    Subjective: Pt reports feeling a lot better and asking to go home  today.  She says she will be sure to take her antibiotics and follow up as recommended.  No specific complaints.  No abdominal pain and had BM yesterday. She is agreeable to home health.    Discharge Exam: Vitals:   06/28/23 0607 06/28/23 1309  BP: 108/62 110/67  Pulse: 80 79  Resp: 18 18  Temp: 97.8 F (36.6 C) 97.8 F (36.6 C)  SpO2: 99% 99%   Vitals:   06/27/23 2158 06/28/23 0202 06/28/23 0607 06/28/23 1309  BP: 111/71 107/64 108/62 110/67  Pulse: 77 76 80 79  Resp: 18 18 18 18   Temp: 98.6 F (37 C) 97.8 F (36.6 C) 97.8 F (36.6 C) 97.8 F (36.6 C)  TempSrc: Oral Oral Oral Oral  SpO2: 97% 96% 99% 99%  Weight:      Height:       General: Pt is alert, awake, not in acute distress Cardiovascular: normal S1/S2 +, no rubs, no gallops Respiratory: CTA bilaterally, no wheezing, no rhonchi Abdominal: Soft, NT, ND, bowel sounds + Extremities: markedly improved erythema of LUE and dimpling of skin as sign of resolving infection; peripheral edema unchanged;    The results of significant diagnostics from this hospitalization (including imaging, microbiology, ancillary and laboratory) are listed below for reference.     Microbiology: Recent Results  (from the past 240 hour(s))  Blood Culture (routine x 2)     Status: None (Preliminary result)   Collection Time: 06/25/23 10:25 AM   Specimen: Right Antecubital; Blood  Result Value Ref Range Status   Specimen Description RIGHT ANTECUBITAL  Final   Special Requests   Final    BOTTLES DRAWN AEROBIC AND ANAEROBIC Blood Culture results may not be optimal due to an excessive volume of blood received in culture bottles   Culture   Final    NO GROWTH 3 DAYS Performed at [facility], [address] [place], [place] [address]    Report Status PENDING  Incomplete  Blood Culture (routine x 2)     Status: None (Preliminary result)   Collection Time: 06/25/23 10:35 AM   Specimen: Right Antecubital; Blood  Result Value Ref Range Status   Specimen Description RIGHT ANTECUBITAL  Final   Special Requests   Final    BOTTLES DRAWN AEROBIC AND ANAEROBIC Blood Culture results may not be optimal due to an inadequate volume of blood received in culture bottles   Culture   Final    NO GROWTH 3 DAYS Performed at [facility], [address] [place], [place] [address]    Report Status PENDING  Incomplete  MRSA Next Gen by PCR, Nasal     Status: None   Collection Time: 06/25/23  3:15 PM   Specimen: Nasal Mucosa; Nasal Swab  Result Value Ref Range Status   MRSA by PCR Next Gen NOT DETECTED NOT DETECTED Final    Comment: (NOTE) The GeneXpert MRSA Assay (FDA approved for NASAL specimens only), is one component of a comprehensive MRSA colonization surveillance program. It is not intended to diagnose MRSA infection nor to guide or monitor treatment for MRSA infections. Test performance is not FDA approved in patients less than 62 years old. Performed at [facility], [address], [place], [place] [address]      Labs: BNP (last 3 results) Recent Labs    03/21/23 1146 04/12/23 1730 06/25/23 1029  BNP 70.0 135.0* 77.0   Basic Metabolic Panel: Recent Labs  Lab 06/25/23 1029 06/26/23 0435  06/27/23 0432 06/28/23 0427  NA 131* 135 133* 135  K 3.2* 3.3* 3.3* 3.1*  CL 98 105 103 104  CO2 25 24 27 26   GLUCOSE 125* 95 128* 79  BUN 14 11 8 8   CREATININE 0.61 0.45 0.42* 0.37*  CALCIUM 6.6* 6.7* 6.6* 6.8*  MG  --  1.7 2.0 1.9   Liver Function Tests: Recent Labs  Lab 06/25/23 1029 06/27/23 0432 06/28/23 0427  AST 18  --  14*  ALT 22  --  14  ALKPHOS 96  --  69  BILITOT 0.4  --  0.2*  PROT 3.2*  --  3.2*  ALBUMIN <1.5* <1.5* <1.5*   No results for input(s): "LIPASE", "AMYLASE" in the last 168 hours. No results for input(s): "AMMONIA" in the last 168 hours. CBC: Recent Labs  Lab 06/25/23 1029 06/26/23 0435 06/27/23 0432  WBC 22.9* 23.4* 9.7  NEUTROABS 19.9*  --   --   HGB 11.6* 10.2* 9.0*  HCT 36.1 31.6* 28.4*  MCV 99.7 99.7 100.0  PLT 225 215 158   Cardiac Enzymes: No results for input(s): "CKTOTAL", "CKMB", "CKMBINDEX", "TROPONINI" in the last 168 hours. BNP: Invalid input(s): "POCBNP" CBG: No results for input(s): "GLUCAP" in the last 168 hours. D-Dimer No results for input(s): "DDIMER" in the last 72 hours. Hgb A1c No results for input(s): "HGBA1C" in the last 72 hours. Lipid Profile No results for input(s): "CHOL", "HDL", "LDLCALC", "TRIG", "CHOLHDL", "LDLDIRECT" in the last 72 hours. Thyroid function studies No results for input(s): "TSH", "T4TOTAL", "T3FREE", "THYROIDAB" in the last 72 hours.  Invalid input(s): "FREET3" Anemia work up No results for input(s): "VITAMINB12", "FOLATE", "FERRITIN", "TIBC", "IRON", "RETICCTPCT" in the last 72 hours. Urinalysis    Component Value Date/Time   COLORURINE YELLOW 06/25/2023 1627   APPEARANCEUR CLEAR 06/25/2023 1627   LABSPEC 1.010 06/25/2023 1627   PHURINE 6.0 06/25/2023 1627   GLUCOSEU NEGATIVE 06/25/2023 1627   HGBUR NEGATIVE 06/25/2023 1627   BILIRUBINUR NEGATIVE 06/25/2023 1627   KETONESUR NEGATIVE 06/25/2023 1627   PROTEINUR NEGATIVE 06/25/2023 1627   NITRITE POSITIVE (A) 06/25/2023 1627    LEUKOCYTESUR SMALL (A) 06/25/2023 1627   Sepsis Labs Recent Labs  Lab 06/25/23 1029 06/26/23 0435 06/27/23 0432  WBC 22.9* 23.4* 9.7   Microbiology Recent Results (from the past 240 hour(s))  Blood Culture (routine x 2)     Status: None (Preliminary result)   Collection Time: 06/25/23 10:25 AM   Specimen: Right Antecubital; Blood  Result Value Ref Range Status   Specimen Description RIGHT ANTECUBITAL  Final   Special Requests   Final    BOTTLES DRAWN AEROBIC AND ANAEROBIC Blood Culture results may not be optimal due to an excessive volume of blood received in culture bottles   Culture   Final    NO GROWTH 3 DAYS Performed at [facility], [address] [place], [place] [address]    Report Status PENDING  Incomplete  Blood Culture (routine x 2)     Status: None (Preliminary result)   Collection Time: 06/25/23 10:35 AM   Specimen: Right Antecubital; Blood  Result Value Ref Range Status   Specimen Description RIGHT ANTECUBITAL  Final   Special Requests   Final    BOTTLES DRAWN AEROBIC AND ANAEROBIC Blood Culture results may not be optimal due to an inadequate volume of blood received in culture bottles   Culture   Final    NO GROWTH 3 DAYS Performed at [facility], [address] [place], [place] [address]  Report Status PENDING  Incomplete  MRSA Next Gen by PCR, Nasal     Status: None   Collection Time: 06/25/23  3:15 PM   Specimen: Nasal Mucosa; Nasal Swab  Result Value Ref Range Status   MRSA by PCR Next Gen NOT DETECTED NOT DETECTED Final    Comment: (NOTE) The GeneXpert MRSA Assay (FDA approved for NASAL specimens only), is one component of a comprehensive MRSA colonization surveillance program. It is not intended to diagnose MRSA infection nor to guide or monitor treatment for MRSA infections. Test performance is not FDA approved in patients less than 73 years old. Performed at [facility], [address] [place], [place] [address]    Time coordinating  discharge: 44 mins  SIGNED:  Standley Dakins, MD  [facility] 06/28/2023, 1:15 PM How to contact the [facility] Attending or Consulting provider 7A - 7P or covering provider during after hours 7P -7A, for this patient?  Check the care team in [facility] and look for a) attending/consulting [facility] provider listed and b) the [facility] team listed Log into [email or web address] and use [facility] universal password to access. If you do not have the password, please contact the hospital operator. Locate the [facility] provider you are looking for under [facility] and page to a number that you can be directly reached. If you still have difficulty reaching the provider, please page the [facility] (Director on Call) for the Hospitalists listed on amion for assistance.

## 2023-06-28 NOTE — Care Management Important Message (Deleted)
Important Message  Patient Details  Name: DESTANEY PETRI MRN: [id number] Date of Birth: 10-23-1950   Medicare Important Message Given:  N/A - LOS <3 / Initial given by admissions     Corey Harold 06/28/2023, 2:34 PM

## 2023-06-30 LAB — CULTURE, BLOOD (ROUTINE X 2)
Culture: NO GROWTH
Culture: NO GROWTH

## 2023-07-02 ENCOUNTER — Telehealth: Payer: Self-pay

## 2023-07-02 NOTE — Transitions of Care (Post Inpatient/ED Visit) (Signed)
07/02/2023  Name: Kayla Dean MRN: [id number] DOB: 1951/09/10  Today's TOC FU Call Status: Today's TOC FU Call Status:: Successful TOC FU Call Completed TOC FU Call Complete Date: 07/02/23 Patient's Name and Date of Birth confirmed.  Transition Care Management Follow-up Telephone Call Date of Discharge: 06/28/23 [facility] Type of Discharge: Inpatient Admission Primary Inpatient Discharge Diagnosis:: cellulitis How have you been since you were released from the hospital?: Worse Any questions or concerns?: No  Items Reviewed: Did you receive and understand the discharge instructions provided?: Yes Medications obtained,verified, and reconciled?: Yes (Medications Reviewed) Any new allergies since your discharge?: No Dietary orders reviewed?: Yes Do you have support at home?: Yes People in Home: child(ren), adult  Medications Reviewed Today: Medications Reviewed Today     Reviewed by Karena Addison, LPN (Licensed Practical Nurse) on 07/02/23 at 1511  Med List Status: <None>   Medication Order Taking? Sig Documenting Provider Last Dose Status Informant  acetaminophen (TYLENOL) 500 MG tablet [id number] No Take 1,000 mg by mouth every 8 (eight) hours as needed for moderate pain. [provider] Past Week Active Self, Pharmacy Records, Family Member  apixaban (ELIQUIS) 5 MG TABS tablet [id number] No Take 1 tablet (5 mg total) by mouth 2 (two) times daily. Sharlene Dory, NP 06/25/2023 0800 Active Self, Family Member, Pharmacy Records  Calcium Carb-Cholecalciferol (CALCIUM 600 + D) 600-200 MG-UNIT TABS [id number] No Take 1 tablet by mouth every morning. [provider] 06/24/2023 Active Self, Pharmacy Records, Family Member  Cholecalciferol (VITAMIN D-3 PO) [id number] No Take 1 capsule by mouth daily. [provider] 06/24/2023 Active Self, Pharmacy Records, Family Member  doxycycline (VIBRAMYCIN) 100 MG capsule [id number]  Take 1 capsule  (100 mg total) by mouth 2 (two) times daily for 5 days. Cleora Fleet, MD  Active   Elastic Bandages & Supports (ABDOMINAL BINDER/ELASTIC MED) MISC [id number] No Apply as directed for orthostatic hypotension Sharlene Dory, NP Taking Active Self, Family Member, Pharmacy Records  fludrocortisone (FLORINEF) 0.1 MG tablet [id number] No Take 1 tablet (0.1 mg total) by mouth daily. Sharlene Dory, NP 06/25/2023 Active Self, Family Member, Pharmacy Records  furosemide (LASIX) 20 MG tablet [id number] No Take 1 tablet (20 mg total) by mouth daily. Sharlene Dory, NP 06/24/2023 Active Self, Family Member, Pharmacy Records           Med Note Mayford Knife, Shelba Flake Jun 25, 2023  1:56 PM) Dr Philis Nettle sent text to take 40 mg daily as of 06/25/2023  levothyroxine (SYNTHROID, LEVOTHROID) 137 MCG tablet [id number] No Take 137 mcg by mouth daily before breakfast. [provider] 06/25/2023 Active Self, Pharmacy Records, Family Member  midodrine (PROAMATINE) 10 MG tablet [id number]  Take 1 tablet (10 mg total) by mouth 3 (three) times daily with meals. Cleora Fleet, MD  Active   Multiple Vitamin (MULTIVITAMIN WITH MINERALS) TABS tablet [id number] No Take 1 tablet by mouth every morning. [provider] 06/24/2023 Active Self, Pharmacy Records, Family Member  Nutritional Supplements (,FEEDING SUPPLEMENT, PROSOURCE PLUS) liquid [id number]  Take 30 mLs by mouth 2 (two) times daily between meals. Johnson, Clanford L, MD  Active   ondansetron (ZOFRAN-ODT) 4 MG disintegrating tablet [id number] No Take 1 tablet (4 mg total) by mouth every 6 (six) hours as needed for nausea. Lucretia Roers, MD unknown Active Self, Pharmacy Records, Family Member  oxyCODONE (OXY IR/ROXICODONE) 5 MG immediate release tablet [id number] No Take 1 tablet (5 mg total) by mouth every 4 (four)  hours as needed for severe pain or breakthrough pain. Lucretia Roers, MD 06/25/2023 Active Self, Family Member, Pharmacy Records  potassium  chloride (KLOR-CON M) 10 MEQ tablet [id number]  Take 1 tablet (10 mEq total) by mouth daily. Johnson, Clanford L, MD  Active   saccharomyces boulardii (FLORASTOR) 250 MG capsule [id number]  Take 1 capsule (250 mg total) by mouth 2 (two) times daily. Johnson, Clanford L, MD  Active   zolpidem (AMBIEN) 5 MG tablet [id number] No Take 1 tablet (5 mg total) by mouth at bedtime as needed for sleep. Lucretia Roers, MD Past Month Active Self, Pharmacy Records, Family Member            Home Care and Equipment/Supplies: Were Home Health Services Ordered?: Yes [facility] of [facility] Health Agency[facility] Has Agency set up a time to come to your home?: Yes First Home Health Visit Date: 07/02/23 Any new equipment or medical supplies ordered?: NA  Functional Questionnaire: Do you need assistance with bathing/showering or dressing?: No Do you need assistance with meal preparation?: No Do you need assistance with eating?: No Do you have difficulty maintaining continence: No Do you need assistance with getting out of bed/getting out of a chair/moving?: No Do you have difficulty managing or taking your medications?: No  Follow up appointments reviewed: PCP Follow-up appointment confirmed?: Yes Date of PCP follow-up appointment?: 07/12/23 [facility]-up Provider: [facility] Follow-up appointment confirmed?: NA Do you need transportation to your follow-up appointment?: No Do you understand care options if your condition(s) worsen?: Yes-patient verbalized understanding    SIGNATURE Karena Addison, LPN [facility] Nurse Health Advisor Direct Dial [phone]

## 2023-07-03 ENCOUNTER — Encounter: Payer: Self-pay | Admitting: General Surgery

## 2023-07-03 ENCOUNTER — Encounter: Payer: Medicare Other | Admitting: General Surgery

## 2023-07-03 ENCOUNTER — Ambulatory Visit (INDEPENDENT_AMBULATORY_CARE_PROVIDER_SITE_OTHER): Payer: Medicare Other | Admitting: General Surgery

## 2023-07-03 ENCOUNTER — Telehealth: Payer: Self-pay | Admitting: Internal Medicine

## 2023-07-03 VITALS — BP 107/76 | HR 79 | Temp 98.1°F | Resp 12 | Ht 62.0 in | Wt 159.0 lb

## 2023-07-03 DIAGNOSIS — R1084 Generalized abdominal pain: Secondary | ICD-10-CM | POA: Diagnosis not present

## 2023-07-03 NOTE — Telephone Encounter (Signed)
Mierva  with [facility] called in on patient behalf  Wants to know if provider will be following patient after Discharge from [facility]   Wants a call back  [phone]

## 2023-07-03 NOTE — Patient Instructions (Addendum)
CT a/p will be done. Will see what intestines look like Do the prosource, try to get in 100-150g of protein a day.  Will call with results.  Take some probiotic for the loose stools.

## 2023-07-03 NOTE — Telephone Encounter (Signed)
Awaiting PCP response

## 2023-07-03 NOTE — Progress Notes (Signed)
[facility]  Doing fair after admission for LUE edema/ cellulitis. Doing fair but says arm is still swollen.   Still has intermittent abdominal pain on the right side but is eating and taking in more protein. Has been trying to get in 100 g a day with ensure and now has ordered some prosource.  It has been 3 months since the last CT scan was ordered.  BP 107/76   Pulse 79   Temp 98.1 F (36.7 C) (Oral)   Resp 12   Ht 5\' 2"  (1.575 m)   Wt 159 lb (72.1 kg)   SpO2 95%   BMI 29.08 kg/m  Soft abdomen, tender with deep palpation on the right  LUE with swelling, volar forearm with some redness   Patient well known to me. She is s/p Ex lap and reduction of incarcerated hernia 4/19 with viable bowel intraoperatively (See photos intraoperative) and she had very odd anatomy with the alimentary limb having an additional anastomosis (See Photo of drawing in operative note).    Post operative she has had multiple complications including post operative right sided pain and edematous bowel and mesentery and a question of a small area of intussusception that was not obstructive on CT scan. Subsequent CTs have questions a picture looking like chronic mesenteric ischemia but I think what we are seeing is bowel with poor venous outflow and thus poor perfusion. We tried a low residue diet to see if the bowel would heal, and she has been eating and tolerating diet for the most part. She is still now 100%. In this time she has lost weight and her albumin remains low. She is trying to eat more protein but this is not increasing her albumin.   In June she had a CT a/p and this showed a PE and she has been on Eliquis since that time. She has seen Hematology and they recommend 3 months of Eliquis as they think the PE was from inactivity and weakness.   With her low albumin, I think she continues to have this peripheral edema.   We have discussed her options and I am afraid she is going to need  revision of her gastric bypass in order to resect the edematous SB that has remained on the CTs. She would like to stay local in [place].   I will reach out to Dr. Cliffton Asters about her options for surgery in the future.   CT a/p will be done. Will see what intestines look like Do the prosource, try to get in 100-150g of protein a day.  Will call with results.  Take some probiotic for the loose stools.    Algis Greenhouse, MD [facility] [address] [place], [place] [address] [phone] (office)

## 2023-07-03 NOTE — Telephone Encounter (Signed)
Kayla Dean calling back.

## 2023-07-04 ENCOUNTER — Encounter: Payer: Self-pay | Admitting: Orthopaedic Surgery

## 2023-07-04 ENCOUNTER — Other Ambulatory Visit (INDEPENDENT_AMBULATORY_CARE_PROVIDER_SITE_OTHER): Payer: Medicare Other

## 2023-07-04 ENCOUNTER — Ambulatory Visit (INDEPENDENT_AMBULATORY_CARE_PROVIDER_SITE_OTHER): Payer: Medicare Other | Admitting: Orthopaedic Surgery

## 2023-07-04 DIAGNOSIS — S62645D Nondisplaced fracture of proximal phalanx of left ring finger, subsequent encounter for fracture with routine healing: Secondary | ICD-10-CM

## 2023-07-04 NOTE — Progress Notes (Signed)
I feel better.  She has less pain and swelling of the left ring finger.  She lacks full flexion.  NV intact.  X-rays were done of the left ring finger, reported separately.  Encounter Diagnosis  Name Primary?   Closed nondisplaced fracture of proximal phalanx of left ring finger with routine healing, subsequent encounter Yes   I have gone over exercises with her for the finger.  Return in two weeks.  X-rays on return.  Call if any problem.  Precautions discussed.  Electronically Signed Darreld Mclean, MD 9/18/20248:32 AM

## 2023-07-05 ENCOUNTER — Ambulatory Visit (HOSPITAL_COMMUNITY)
Admission: RE | Admit: 2023-07-05 | Discharge: 2023-07-05 | Disposition: A | Discharge status: Home / Self Care | Payer: Medicare Other | Source: Ambulatory Visit | Attending: General Surgery | Admitting: General Surgery

## 2023-07-05 DIAGNOSIS — R1084 Generalized abdominal pain: Secondary | ICD-10-CM | POA: Insufficient documentation

## 2023-07-05 MED ORDER — IOHEXOL 9 MG/ML PO SOLN
ORAL | Status: AC
  Filled 2023-07-05: qty 1000

## 2023-07-05 MED ORDER — IOHEXOL 300 MG/ML  SOLN
100.0000 mL | Freq: Once | INTRAMUSCULAR | Status: AC | PRN
  Administered 2023-07-05: 100 mL via INTRAVENOUS

## 2023-07-05 NOTE — Telephone Encounter (Signed)
Left detailed message for [facility]

## 2023-07-09 ENCOUNTER — Ambulatory Visit: Payer: Medicare Other | Admitting: Nurse Practitioner

## 2023-07-12 ENCOUNTER — Ambulatory Visit: Payer: Medicare Other | Admitting: Internal Medicine

## 2023-07-12 ENCOUNTER — Encounter: Payer: Self-pay | Admitting: Internal Medicine

## 2023-07-12 VITALS — BP 105/69 | HR 85 | Ht 62.0 in | Wt 140.2 lb

## 2023-07-12 DIAGNOSIS — A419 Sepsis, unspecified organism: Secondary | ICD-10-CM

## 2023-07-12 DIAGNOSIS — D649 Anemia, unspecified: Secondary | ICD-10-CM | POA: Diagnosis not present

## 2023-07-12 DIAGNOSIS — Z09 Encounter for follow-up examination after completed treatment for conditions other than malignant neoplasm: Secondary | ICD-10-CM | POA: Diagnosis not present

## 2023-07-12 DIAGNOSIS — K529 Noninfective gastroenteritis and colitis, unspecified: Secondary | ICD-10-CM | POA: Insufficient documentation

## 2023-07-12 DIAGNOSIS — R197 Diarrhea, unspecified: Secondary | ICD-10-CM

## 2023-07-12 DIAGNOSIS — R6520 Severe sepsis without septic shock: Secondary | ICD-10-CM | POA: Diagnosis not present

## 2023-07-12 DIAGNOSIS — E8809 Other disorders of plasma-protein metabolism, not elsewhere classified: Secondary | ICD-10-CM

## 2023-07-12 NOTE — Progress Notes (Signed)
Let patient know still with an area of thickened bowel but I think less bowel is involved now than in June. Overall I think it has improved but definitely not resolved. I have sent Dr. Andrey Campanile a message but have not heard back. I will try to reach out to him again to review her information to see how he feels about any bariatric revision in her future.

## 2023-07-12 NOTE — Progress Notes (Signed)
Established Patient Office Visit  Subjective:  Patient ID: Kayla Dean, female    DOB: [birth year]  Age: 72 y.o. MRN: [id number]  CC:  Chief Complaint  Patient presents with   Hospitalization Follow-up    Hospital Follow up     HPI Kayla Dean is a 72 y.o. 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  She presented to the ED with worsening left-sided forearm edema with erythema, warmth, and pain. This began approximately 2-3 days prior to arrival. She has had ongoing bilateral lower extremity edema which is a chronic issue. She denies any acute injuries and states that she has been taking her medications as prescribed. She recently fractured a finger on her left upper extremity with a recent fall and has a splint.  Today, she reports improvement in swelling and redness over left UE.  She still feels soft tissue swelling/mass in the left forearm area.  Of note, she takes Eliquis for history of DVT of RLE.  She was placed on Levophed initially, which was weaned off later. She takes Fludrocortisone and Midodrine for hypotension.  Leg swelling and wound: She has chronic bilateral leg swelling, and has had weeping edema in the past.  She has 2 small open wounds, 1 on each leg, which is erythematous.  Denies any pus discharge or bleeding currently.  She has wrapped bandage over them currently.  Denies any fever or chills.  She has hypoalbuminemia, which is likely contributing to generalized swelling including bilateral LE.  She has been given Lasix by cardiology in the past, but she does not tolerate it due to her borderline low BP.  She takes protein supplement, but her daughter reports that she needs to improve her protein supplement.  Diarrhea: She reports watery  diarrhea for the last 5 months since her exploratory laparotomy in 04/24 for SBO.  She recently had CT abdomen, which showed possible enteritis, which is apparently improving compared to prior.  Denies any melena or hematochezia.  Past Medical History:  Diagnosis Date   Aortic aneurysm (HCC)    Arthritis    H/O gastric bypass    Hiatal hernia    Hypothyroidism    Small bowel obstruction (HCC)     Past Surgical History:  Procedure Laterality Date   ABDOMINAL HYSTERECTOMY     CHOLECYSTECTOMY     COLON SURGERY     HERNIA REPAIR     LAPAROTOMY N/A 02/02/2023   Procedure: EXPLORATORY LAPAROTOMY, [name], EXPLANT OF ABDOMINAL MESH;  Surgeon: Lucretia Roers, MD;  Location: [facility];  Service: General;  Laterality: N/A;   REPLACEMENT TOTAL KNEE Right    ROTATOR CUFF REPAIR Right    TOTAL HIP ARTHROPLASTY Right     Family History  Problem Relation Age of Onset   CAD Father    CAD Brother     Social History   Socioeconomic History   Marital status: Widowed    Spouse name: Not on file   Number of children: Not on file   Years of education: Not on file   Highest education level: Not on file  Occupational History   Not on file  Tobacco Use   Smoking status: Never   Smokeless tobacco: Never  Vaping Use   Vaping status: Never Used  Substance and Sexual Activity  Alcohol use: Never   Drug use: Never   Sexual activity: Not on file  Other Topics Concern   Not on file  Social History Narrative   Not on file   Social Determinants of Health   Financial Resource Strain: Not on file  Food Insecurity: No Food Insecurity (06/25/2023)   Hunger Vital Sign    Worried About Running Out of Food in the Last Year: Never true    Ran Out of Food in the Last Year: Never true  Transportation Needs: No Transportation Needs (06/25/2023)   PRAPARE - Administrator, Civil Service (Medical): No    Lack of Transportation (Non-Medical): No  Physical Activity:  Not on file  Stress: Not on file  Social Connections: Not on file  Intimate Partner Violence: Not At Risk (06/25/2023)   Humiliation, Afraid, Rape, and Kick questionnaire    Fear of Current or Ex-Partner: No    Emotionally Abused: No    Physically Abused: No    Sexually Abused: No    Outpatient Medications Prior to Visit  Medication Sig Dispense Refill   Lactobacillus (PROBIOTIC ACIDOPHILUS) CAPS Take 1 capsule by mouth 2 (two) times daily.     acetaminophen (TYLENOL) 500 MG tablet Take 1,000 mg by mouth every 8 (eight) hours as needed for moderate pain.     apixaban (ELIQUIS) 5 MG TABS tablet Take 1 tablet (5 mg total) by mouth 2 (two) times daily. 60 tablet 2   Calcium Carb-Cholecalciferol (CALCIUM 600 + D) 600-200 MG-UNIT TABS Take 1 tablet by mouth every morning.     Cholecalciferol (VITAMIN D-3 PO) Take 1 capsule by mouth daily.     Elastic Bandages & Supports (ABDOMINAL BINDER/ELASTIC MED) MISC Apply as directed for orthostatic hypotension 1 each 1   fludrocortisone (FLORINEF) 0.1 MG tablet Take 1 tablet (0.1 mg total) by mouth daily. 30 tablet 6   furosemide (LASIX) 20 MG tablet Take 1 tablet (20 mg total) by mouth daily. 90 tablet 3   levothyroxine (SYNTHROID, LEVOTHROID) 137 MCG tablet Take 137 mcg by mouth daily before breakfast.     midodrine (PROAMATINE) 10 MG tablet Take 1 tablet (10 mg total) by mouth 3 (three) times daily with meals. 90 tablet 0   Multiple Vitamin (MULTIVITAMIN WITH MINERALS) TABS tablet Take 1 tablet by mouth every morning.     Nutritional Supplements (,FEEDING SUPPLEMENT, PROSOURCE PLUS) liquid Take 30 mLs by mouth 2 (two) times daily between meals. 887 mL 1   ondansetron (ZOFRAN-ODT) 4 MG disintegrating tablet Take 1 tablet (4 mg total) by mouth every 6 (six) hours as needed for nausea. 20 tablet 0   oxyCODONE (OXY IR/ROXICODONE) 5 MG immediate release tablet Take 1 tablet (5 mg total) by mouth every 4 (four) hours as needed for severe pain or breakthrough  pain. 20 tablet 0   potassium chloride (KLOR-CON M) 10 MEQ tablet Take 1 tablet (10 mEq total) by mouth daily. 7 tablet 0   saccharomyces boulardii (FLORASTOR) 250 MG capsule Take 1 capsule (250 mg total) by mouth 2 (two) times daily. 60 capsule 0   zolpidem (AMBIEN) 5 MG tablet Take 1 tablet (5 mg total) by mouth at bedtime as needed for sleep. 30 tablet 0   No facility-administered medications prior to visit.    No Known Allergies  ROS Review of Systems  Constitutional:  Positive for fatigue. Negative for chills and fever.  HENT:  Negative for congestion, sinus pressure, sinus pain and sore throat.  Eyes:  Negative for pain and discharge.  Respiratory:  Negative for cough and shortness of breath.   Cardiovascular:  Positive for leg swelling. Negative for chest pain and palpitations.  Gastrointestinal:  Positive for diarrhea. Negative for abdominal pain, nausea and vomiting.  Endocrine: Negative for polydipsia and polyuria.  Genitourinary:  Negative for dysuria and hematuria.  Musculoskeletal:  Negative for neck pain and neck stiffness.  Skin:  Negative for rash.  Neurological:  Positive for weakness. Negative for dizziness.  Psychiatric/Behavioral:  Negative for agitation and behavioral problems.       Objective:    Physical Exam Vitals reviewed.  Constitutional:      General: She is not in acute distress.    Appearance: She is not diaphoretic.  HENT:     Head: Normocephalic and atraumatic.     Mouth/Throat:     Mouth: Mucous membranes are moist.     Pharynx: No posterior oropharyngeal erythema.  Eyes:     General: No scleral icterus.    Extraocular Movements: Extraocular movements intact.  Cardiovascular:     Rate and Rhythm: Normal rate and regular rhythm.     Heart sounds: Normal heart sounds. No murmur heard. Pulmonary:     Breath sounds: Normal breath sounds. No wheezing or rales.  Abdominal:     Palpations: Abdomen is soft.     Tenderness: There is no  abdominal tenderness.  Musculoskeletal:     Cervical back: Neck supple. No tenderness.     Right lower leg: Edema (2+) present.     Left lower leg: Edema (2+) present.  Skin:    General: Skin is warm.     Comments: Has about 1 cm in diameter wound on b/l legs - healthy granulation tissue noted, well-healing, likely ruptured blisters Has a blister on LUE near elbow - about 2 cm in length  Neurological:     General: No focal deficit present.     Mental Status: She is alert and oriented to person, place, and time.  Psychiatric:        Mood and Affect: Mood normal.        Behavior: Behavior normal.     BP 105/69 (BP Location: Left Arm, Patient Position: Sitting, Cuff Size: Normal)   Pulse 85   Ht 5\' 2"  (1.575 m)   Wt 140 lb 3.2 oz (63.6 kg)   SpO2 96%   BMI 25.64 kg/m  Wt Readings from Last 3 Encounters:  07/12/23 140 lb 3.2 oz (63.6 kg)  07/03/23 159 lb (72.1 kg)  06/27/23 167 lb 8.8 oz (76 kg)    Lab Results  Component Value Date   TSH 3.284 04/13/2023   Lab Results  Component Value Date   WBC 9.7 06/27/2023   HGB 9.0 (L) 06/27/2023   HCT 28.4 (L) 06/27/2023   MCV 100.0 06/27/2023   PLT 158 06/27/2023   Lab Results  Component Value Date   NA 135 06/28/2023   K 3.1 (L) 06/28/2023   CO2 26 06/28/2023   GLUCOSE 79 06/28/2023   BUN 8 06/28/2023   CREATININE 0.37 (L) 06/28/2023   BILITOT 0.2 (L) 06/28/2023   ALKPHOS 69 06/28/2023   AST 14 (L) 06/28/2023   ALT 14 06/28/2023   PROT 3.2 (L) 06/28/2023   ALBUMIN <1.5 (L) 06/28/2023   CALCIUM 6.8 (L) 06/28/2023   ANIONGAP 5 06/28/2023   Lab Results  Component Value Date   CHOL 126 08/13/2018   Lab Results  Component Value Date  HDL 37 (L) 08/13/2018   Lab Results  Component Value Date   LDLCALC 77 08/13/2018   Lab Results  Component Value Date   TRIG 60 08/13/2018   Lab Results  Component Value Date   CHOLHDL 3.4 08/13/2018   Lab Results  Component Value Date   HGBA1C 4.7 (L) 08/13/2018       Assessment & Plan:   Problem List Items Addressed This Visit       Digestive   Enteritis    Noted on recent CT abdomen -apparently improving Since she has chronic diarrhea, check stool GI profile Advised to use Benefiber for diarrhea, has tried probiotic without much relief      Relevant Orders   GI Profile, Stool, PCR     Other   Hypoalbuminemia    Likely contributing to anasarca Needs to increase fluid intake Her low BP does not allow use of Lasix       Normocytic anemia    Unclear etiology Followed by Hematology Last CBC during hospitalization showed Hb of 9.0, had improved to 11 in 08/24 On Eliquis for h/o DVT Check CBC      Relevant Orders   CBC with Differential/Platelet   Severe sepsis (HCC) - Primary    Likely due to left UE cellulitis -now resolved Completed IV  followed by oral antibiotics BP WNL today with midodrine      Relevant Orders   CMP14+EGFR   CBC with Differential/Platelet   Hospital discharge follow-up    Hospital chart reviewed, including discharge summary Medications reconciled and reviewed with the patient in detail      Relevant Orders   CMP14+EGFR   CBC with Differential/Platelet   Other Visit Diagnoses     Diarrhea, unspecified type       Relevant Orders   GI Profile, Stool, PCR       No orders of the defined types were placed in this encounter.   Follow-up: Return if symptoms worsen or fail to improve.    Anabel Halon, MD

## 2023-07-12 NOTE — Patient Instructions (Addendum)
Please continue protein supplements.  Please take Benefiber for diarrhea.  Please get stool test in this week.  Please continue taking other medications as prescribed.

## 2023-07-12 NOTE — Assessment & Plan Note (Signed)
Likely contributing to anasarca Needs to increase fluid intake Her low BP does not allow use of Lasix

## 2023-07-12 NOTE — Assessment & Plan Note (Addendum)
Unclear etiology Followed by Hematology Last CBC during hospitalization showed Hb of 9.0, had improved to 11 in 08/24 On Eliquis for h/o DVT Check CBC

## 2023-07-12 NOTE — Assessment & Plan Note (Signed)
Likely due to left UE cellulitis -now resolved Completed IV  followed by oral antibiotics BP WNL today with midodrine

## 2023-07-12 NOTE — Assessment & Plan Note (Signed)
Noted on recent CT abdomen -apparently improving Since she has chronic diarrhea, check stool GI profile Advised to use Benefiber for diarrhea, has tried probiotic without much relief

## 2023-07-12 NOTE — Assessment & Plan Note (Signed)
Hospital chart reviewed, including discharge summary Medications reconciled and reviewed with the patient in detail

## 2023-07-13 LAB — CMP14+EGFR
ALT: 18 [IU]/L (ref 0–32)
AST: 19 [IU]/L (ref 0–40)
Albumin: 2 g/dL — ABNORMAL LOW (ref 3.8–4.8)
Alkaline Phosphatase: 71 [IU]/L (ref 44–121)
BUN/Creatinine Ratio: 36 — ABNORMAL HIGH (ref 12–28)
BUN: 16 mg/dL (ref 8–27)
Bilirubin Total: <0.2 mg/dL (ref 0.0–1.2)
CO2: 26 mmol/L (ref 20–29)
Calcium: 7.3 mg/dL — ABNORMAL LOW (ref 8.7–10.3)
Chloride: 102 mmol/L (ref 96–106)
Creatinine, Ser: 0.45 mg/dL — ABNORMAL LOW (ref 0.57–1.00)
Globulin, Total: 2.5 g/dL (ref 1.5–4.5)
Glucose: 78 mg/dL (ref 70–99)
Potassium: 3.9 mmol/L (ref 3.5–5.2)
Sodium: 139 mmol/L (ref 134–144)
Total Protein: 4.5 g/dL — ABNORMAL LOW (ref 6.0–8.5)
eGFR: 102 mL/min/{1.73_m2} (ref >59)

## 2023-07-13 LAB — CBC WITH DIFFERENTIAL/PLATELET
Basophils Absolute: 0.1 10*3/uL (ref 0.0–0.2)
Basos: 1 %
EOS (ABSOLUTE): 0 10*3/uL (ref 0.0–0.4)
Eos: 0 %
Hematocrit: 33.8 % — ABNORMAL LOW (ref 34.0–46.6)
Hemoglobin: 10.7 g/dL — ABNORMAL LOW (ref 11.1–15.9)
Immature Grans (Abs): 0 10*3/uL (ref 0.0–0.1)
Immature Granulocytes: 0 %
Lymphocytes Absolute: 4 10*3/uL — ABNORMAL HIGH (ref 0.7–3.1)
Lymphs: 62 %
MCH: 32.1 pg (ref 26.6–33.0)
MCHC: 31.7 g/dL (ref 31.5–35.7)
MCV: 102 fL — ABNORMAL HIGH (ref 79–97)
Monocytes Absolute: 0.6 10*3/uL (ref 0.1–0.9)
Monocytes: 9 %
Neutrophils Absolute: 1.8 10*3/uL (ref 1.4–7.0)
Neutrophils: 28 %
Platelets: 334 10*3/uL (ref 150–450)
RBC: 3.33 x10E6/uL — ABNORMAL LOW (ref 3.77–5.28)
RDW: 13.4 % (ref 11.7–15.4)
WBC: 6.6 10*3/uL (ref 3.4–10.8)

## 2023-07-17 ENCOUNTER — Telehealth: Payer: Self-pay | Admitting: *Deleted

## 2023-07-17 NOTE — Telephone Encounter (Signed)
Call placed to patient. LMTRC.   Appointment scheduled.

## 2023-07-17 NOTE — Telephone Encounter (Signed)
-----   Message from Lucretia Roers sent at 07/17/2023  9:42 AM EDT ----- Regarding: check on Noon I still have not heard back from [facility] about thoughts and I resent the information.   I will talk with some other people and get their opinion.   Lets plan to see her in a few weeks to check on her, make it 4-6 weeks from when I saw her last.   Make sure she is doing ok.   Lillia Abed

## 2023-07-17 NOTE — Telephone Encounter (Signed)
-----   Message from Lucretia Roers sent at 07/12/2023 12:34 PM EDT ----- Regarding: Bypass revision possibly needed? Dr. Andrey Campanile,   I wanted to see your opinion on this patient and accidentally sent the information to Dr. Cliffton Asters last week. In the past when I had asked Dwain Sarna about bariatric surgeons he had recommended you for these more complicated bariatric patients.  Patient well known to me. She is s/p Ex lap and reduction of incarcerated hernia 4/19 with viable bowel intraoperatively (See photos intraoperative) and she had very odd anatomy with the alimentary limb having an additional anastomosis (See Photo of drawing in operative note).  She had her surgery at [facility] for the bypass and says she had to go back for a second soon after maybe an obstruction ? I think this explains her odd anatomy.  I had a hard time finding the notes from [place].   Post operative she has had multiple complications including post operative right sided pain and edematous bowel and mesentery and a question of a small area of intussusception that was not obstructive on CT scan. Subsequent CTs have questions a picture looking like chronic mesenteric ischemia but I think what we are seeing is bowel with poor venous outflow and thus poor perfusion.   I worry that her multiple resections and anastomosis have resulted in bowel that is not as well perfused and did not tolerate the ischemic event even though intraoperatively the bowel looked like it was viable.   During this time, we tried a low residue diet to see if the bowel would heal, and she has been eating and tolerating diet for the most part. She is still now 100%. In this time she has lost weight and her albumin remains low. She is trying to eat more protein but this is not increasing her albumin.   In June she had a CT a/p during and admission for low BP and pain, and this CT showed a PE and she has been on Eliquis since that time. She has seen Hematology and they  recommend 3 months of Eliquis as they think the PE was from inactivity and weakness.  Should be ending in About October.   We have discussed her options and I am afraid she is going to need revision of her gastric bypass in order to resect the edematous SB that has remained on the CTs. I think she may not be absorbing nutrients as well / thus the low protein?   This low protein I am thinking is causing this peripheral edema in the lower and upper extremities. She had a recent admission for LUE cellulitis and her arms are just weeping. Cards has seen her and ECHO normal. This is not her heart causing this edema. It is all so strange.   She would like to stay local in [place]. Is this something you could look at and tell me your thoughts? I am happy to help and discuss this over the phone if that helps too.   Her most recent CT done last week shows improving thickening of the small bowel on my review with less of the bowel having the thickened appearance.   Algis Greenhouse, MD  [facility]  [address]  [place], [place] [address]  [phone] (office)  [phone] CELL

## 2023-07-18 ENCOUNTER — Other Ambulatory Visit (INDEPENDENT_AMBULATORY_CARE_PROVIDER_SITE_OTHER): Payer: Medicare Other

## 2023-07-18 ENCOUNTER — Encounter: Payer: Self-pay | Admitting: Orthopaedic Surgery

## 2023-07-18 ENCOUNTER — Ambulatory Visit (INDEPENDENT_AMBULATORY_CARE_PROVIDER_SITE_OTHER): Payer: Medicare Other | Admitting: Orthopaedic Surgery

## 2023-07-18 DIAGNOSIS — S62645D Nondisplaced fracture of proximal phalanx of left ring finger, subsequent encounter for fracture with routine healing: Secondary | ICD-10-CM | POA: Diagnosis not present

## 2023-07-18 NOTE — Progress Notes (Signed)
I feel OK.  She has no pain of the left ring finger. She has some decreased flexion at the PIP joint but has no swelling or pain, no redness.  NV intact.  X-rays were done of the left ring finger, reported separately.  Encounter Diagnosis  Name Primary?   Closed nondisplaced fracture of proximal phalanx of left ring finger with routine healing, subsequent encounter Yes   I will see her prn.  Continue her exercises of the finger.  Call if any problem.  Precautions discussed.  Electronically Signed Darreld Mclean, MD 10/2/20248:48 AM

## 2023-07-18 NOTE — Telephone Encounter (Signed)
Call placed to patient and patient made aware.  

## 2023-07-19 ENCOUNTER — Telehealth (INDEPENDENT_AMBULATORY_CARE_PROVIDER_SITE_OTHER): Payer: Medicare Other | Admitting: General Surgery

## 2023-07-19 DIAGNOSIS — Z9884 Bariatric surgery status: Secondary | ICD-10-CM

## 2023-07-19 DIAGNOSIS — K639 Disease of intestine, unspecified: Secondary | ICD-10-CM

## 2023-07-19 MED ORDER — PANTOPRAZOLE SODIUM 40 MG PO TBEC: 40.0000 mg | DELAYED_RELEASE_TABLET | Freq: Two times a day (BID) | ORAL | 2 refills | Status: DC

## 2023-07-19 MED ORDER — SUCRALFATE 1 G PO TABS: 1.0000 g | ORAL_TABLET | Freq: Three times a day (TID) | ORAL | 2 refills | Status: DC

## 2023-07-19 NOTE — Telephone Encounter (Signed)
[facility]  Discussed case with bariatric surgeon colleague in [place] (not in our system). She recommends a few additional test (EGD, UGI), starting PPI and carafate, and continue to hold the course with this non operative management as the patient is able to eat and have Bms    Will get PPI and Carafate started as my colleague had suggested due to possibility of any margin ulcers causing issues or discomfort. Patient has not been on a PPI.   Discussed EGD and UGI to further assess the mucosa and the anatomy.  I have reached out to Dr. Marletta Lor regarding Egd and will discuss with him further.  I will order the UGI SBFT.  Patient in agree with this.   Will plan to see her in a few weeks and follow up on the test. Continue with prosource and protein intake.   Algis Greenhouse, MD [facility] [address] [place], [place] [address] [phone] (office)

## 2023-07-19 NOTE — Telephone Encounter (Signed)
DG UGI W SMALL BOWEL scheduled for 07/25/2023 @ 9 AM.  NPO p midnight.   Call placed to patient and patient made aware.

## 2023-07-20 ENCOUNTER — Telehealth: Payer: Self-pay | Admitting: Internal Medicine

## 2023-07-20 ENCOUNTER — Other Ambulatory Visit: Payer: Self-pay | Admitting: Internal Medicine

## 2023-07-20 DIAGNOSIS — A0472 Enterocolitis due to Clostridium difficile, not specified as recurrent: Secondary | ICD-10-CM | POA: Insufficient documentation

## 2023-07-20 DIAGNOSIS — Z8619 Personal history of other infectious and parasitic diseases: Secondary | ICD-10-CM

## 2023-07-20 HISTORY — DX: Personal history of other infectious and parasitic diseases: Z86.19

## 2023-07-20 HISTORY — DX: Enterocolitis due to Clostridium difficile, not specified as recurrent: A04.72

## 2023-07-20 LAB — GI PROFILE, STOOL, PCR

## 2023-07-20 MED ORDER — VANCOMYCIN HCL 125 MG PO CAPS
125.0000 mg | ORAL_CAPSULE | Freq: Four times a day (QID) | ORAL | 0 refills | Status: AC
Start: 2023-07-20 — End: 2023-07-30

## 2023-07-20 NOTE — Telephone Encounter (Signed)
Can we please arrange OV for this patient with me next week? Dx enteritis

## 2023-07-23 ENCOUNTER — Other Ambulatory Visit (INDEPENDENT_AMBULATORY_CARE_PROVIDER_SITE_OTHER): Payer: Self-pay | Admitting: *Deleted

## 2023-07-23 DIAGNOSIS — R1084 Generalized abdominal pain: Secondary | ICD-10-CM

## 2023-07-23 DIAGNOSIS — K639 Disease of intestine, unspecified: Secondary | ICD-10-CM

## 2023-07-23 NOTE — Telephone Encounter (Signed)
Received call from patient [phone]~ telephone.   Patient reports that she has had increased pain in abdomen and requested refill on Oxycodone.   Last prescription given on 05/14/2023, # 20 tabs.   Ok to refill?

## 2023-07-24 ENCOUNTER — Encounter: Payer: Self-pay | Admitting: Nurse Practitioner

## 2023-07-24 ENCOUNTER — Telehealth: Payer: Self-pay | Admitting: Cardiology

## 2023-07-24 ENCOUNTER — Other Ambulatory Visit: Payer: Self-pay | Admitting: Nurse Practitioner

## 2023-07-24 ENCOUNTER — Ambulatory Visit: Payer: Medicare Other | Attending: Nurse Practitioner | Admitting: Nurse Practitioner

## 2023-07-24 ENCOUNTER — Ambulatory Visit: Payer: Medicare Other | Attending: Nurse Practitioner

## 2023-07-24 VITALS — BP 110/70 | HR 91 | Ht 62.0 in | Wt 147.2 lb

## 2023-07-24 DIAGNOSIS — R002 Palpitations: Secondary | ICD-10-CM

## 2023-07-24 DIAGNOSIS — I4891 Unspecified atrial fibrillation: Secondary | ICD-10-CM

## 2023-07-24 DIAGNOSIS — S81802A Unspecified open wound, left lower leg, initial encounter: Secondary | ICD-10-CM

## 2023-07-24 DIAGNOSIS — I82451 Acute embolism and thrombosis of right peroneal vein: Secondary | ICD-10-CM | POA: Diagnosis not present

## 2023-07-24 DIAGNOSIS — I2699 Other pulmonary embolism without acute cor pulmonale: Secondary | ICD-10-CM

## 2023-07-24 DIAGNOSIS — R600 Localized edema: Secondary | ICD-10-CM

## 2023-07-24 DIAGNOSIS — I77819 Aortic ectasia, unspecified site: Secondary | ICD-10-CM | POA: Diagnosis not present

## 2023-07-24 DIAGNOSIS — I959 Hypotension, unspecified: Secondary | ICD-10-CM

## 2023-07-24 DIAGNOSIS — K551 Chronic vascular disorders of intestine: Secondary | ICD-10-CM

## 2023-07-24 DIAGNOSIS — Z01810 Encounter for preprocedural cardiovascular examination: Secondary | ICD-10-CM

## 2023-07-24 DIAGNOSIS — Z79899 Other long term (current) drug therapy: Secondary | ICD-10-CM

## 2023-07-24 MED ORDER — OXYCODONE HCL 5 MG PO TABS: 5.0000 mg | ORAL_TABLET | ORAL | 0 refills | Status: DC | PRN

## 2023-07-24 NOTE — Telephone Encounter (Signed)
Call placed to patient. LMTRC.  

## 2023-07-24 NOTE — Telephone Encounter (Signed)
Unable to reach patient. Left VM for a return call to schedule her with Dr Marletta Lor this week.

## 2023-07-24 NOTE — Telephone Encounter (Signed)
Sent to Darl Pikes to schedule

## 2023-07-24 NOTE — Patient Instructions (Addendum)
Medication Instructions:  Your physician has recommended you make the following change in your medication:  For the next 3 days increase Lasix to 20 Mg Twice daily then return to normal  During these 3 days double your potassium- Double check dose and let us know  After 3 days return to normal dosage   Labwork: BMET, MAG in 1 week   Testing/Procedures: Your physician has recommended that you wear a Zio monitor.   This monitor is a medical device that records the heart's electrical activity. Doctors most often use these monitors to diagnose arrhythmias. Arrhythmias are problems with the speed or rhythm of the heartbeat. The monitor is a small device applied to your chest. You can wear one while you do your normal daily activities. While wearing this monitor if you have any symptoms to push the button and record what you felt. Once you have worn this monitor for the period of time provider prescribed (for 14 days), you will return the monitor device in the postage paid box. Once it is returned they will download the data collected and provide [place] with a report which the provider will then review and we will call you with those results. Important tips:  Avoid showering during the first 24 hours of wearing the monitor. Avoid excessive sweating to help maximize wear time. Do not submerge the device, no hot tubs, and no swimming pools. Keep any lotions or oils away from the patch. After 24 hours you may shower with the patch on. Take brief showers with your back facing the shower head.  Do not remove patch once it has been placed because that will interrupt data and decrease adhesive wear time. Push the button when you have any symptoms and write down what you were feeling. Once you have completed wearing your monitor, remove and place into box which has postage paid and place in your outgoing mailbox.  If for some reason you have misplaced your box then call our office and we can provide another box  and/or mail it off for you.   Follow-Up: Your physician recommends that you schedule a follow-up appointment in: 3-4 weeks   Any Other Special Instructions Will Be Listed Below (If Applicable).  If you need a refill on your cardiac medications before your next appointment, please call your pharmacy.

## 2023-07-24 NOTE — Telephone Encounter (Signed)
Checking percert on the following patient for  2wk zio xt for afib asses

## 2023-07-24 NOTE — Telephone Encounter (Signed)
Pt just tried to call

## 2023-07-24 NOTE — Telephone Encounter (Signed)
Ok to refill. Has she started carafate and protonix?   Algis Greenhouse, MD [facility] [address] [place], [place] [address] [phone] (office)

## 2023-07-24 NOTE — Progress Notes (Signed)
Cardiology Office Note:  .   Date:  07/24/2023 ID:  Kayla Dean, DOB 12-28-1950, MRN [id number] PCP: Billie Lade, MD  [facility] Providers Cardiologist:  Nona Dell, MD    History of Present Illness: .   Kayla Dean is a 72 y.o. female with a PMH of chest pain, palpitations, hypothyroidism, aortic dilatation, hiatal hernia, and hx of gastric bypass, history of hypertension, recent acute PE/DVT, chronic mesenteric ischemia, who presents today for swelling and DOE evaluation.   Last seen by Dr. Diona Browner on November 15, 2022.  She was referred for evaluation by her PCP after ER visit with chest discomfort.  Was ruled out for ACS at that time.  Chest CTA was negative for PE, incidentally noted moderate size hiatal hernia, mild cardiomegaly, ascending thoracic aortic dimension of 4 cm.  At office visit with Dr. Diona Browner, she endorsed right-sided thoracic discomfort/tightness, denied any specific triggers. CCTA revealed mild nonobstructive CAD with evidence of hiatal hernia, borderline dilatation of ascending aorta 39 mm. Cardiac monitor arranged was overall reassuring.   Underwent exploratory laparotomy and reduction of incarcerated hernia in April 2024.  Hospital admission from end of June 2024 to early July 2024 for hypotension, was transferred to [facility] and eventually admitted to ICU and resuscitated with fluids and then subsequently treated with vasopressors, also treated with antibiotics.  Was started on midodrine and hydrocortisone, blood pressure was stabilized.  Etiology found to be multifactorial.  Suspected volume depletion, ischemic colitis.  Sepsis ruled out.  Hospital course complicated by acute PE and acute right peroneal vein DVT that was felt to be probably aggravated with recent surgery, was started on Eliquis.  05/11/2023 -  BP log that showed consistently low SBP since leaving the hospital with more recently improved BP readings over the last 24  hours with max BP reading 121/83. Weight is stable.Was feeling better since leaving the hospital, but not as active, admitted to some DOE and leg swelling inhibited her mobility. Denied any chest pain, sustained palpitations, syncope, presyncope, dizziness, orthopnea, PND, significant weight changes, acute bleeding, or claudication. Noted leg swelling had reduced, however still noted pedal/ankle swelling, was currently wearing compression stockings.   After office visit, she noted worsening extremity edema.  Per review of telephone notes, she is also noted low blood pressures while on Midodrine. Consulted Dr. Diona Browner. Recommended abdominal binder and was eventually started on Florinef. Parameters previously given for Lasix.   Mechanical fall on 05/16/2023. Denied LOC. CT of head and neck were negative for acute findings. X-ray of left hand revealed acute fracture of proximal metadiaphysis of left fourth proximal phalanx.   ED visit on 05/23/2023 for leg edema. Along anterior aspect of right lower leg, exam revealed mild area of erythema with clear fluid weeping. Labwork unremarkable, UA revealed UTI. Thought to be beginning of cellulitis. Sent in a Rx for Keflex.   Recent hospital admission due to severe sepsis secondary to severe left upper extremity cellulitis.  Treated with antibiotics.  She was weaned off norepinephrine infusion, was in ICU for part of her care, transfer to stepdown ICU.  She was evaluated by wound care.  07/24/2023 - Today she presents for hospital follow-up with her daughter.  She states she is overall doing well, continues to note leg edema and weeping coming from her left lower extremity.  Leg swelling appears slightly improved per her report.  Says her skin appears darker than before.  She is continuing antibiotic treatment for C. Difficile,  being managed by PCP.  Blood pressures are better than prior office visit.  Now SBP averaging recently in the 110s/ low 100s. Compliant with  meds. Patient states that nursing staff told her during hospital visit that she had 2-3 episodes of A-fib noted on telemetry.  I do not see documentation regarding this. Also tells me she scheduled for endoscopy tomorrow, has not been holding her Eliquis. She will follow-up with hematology next Thursday to see if she can come off Eliquis due to her DVT.  Overall, she is doing well from a cardiac perspective.  Does admit to rare, mild and brief palpitations about 3 times since I last saw her. Denies any recent chest pain, shortness of breath, syncope, presyncope, orthopnea, PND, swelling or significant weight changes, acute bleeding, or claudication.  Studies Reviewed: Marland Kitchen    EKG:  EKG Interpretation Date/Time:  Tuesday July 24 2023 09:44:14 EDT Ventricular Rate:  79 PR Interval:  160 QRS Duration:  72 QT Interval:  384 QTC Calculation: 440 R Axis:   -52  Text Interpretation: Normal sinus rhythm Low voltage QRS Left anterior fascicular block When compared with ECG of 25-Jun-2023 10:14, PREVIOUS ECG IS PRESENT Confirmed by Sharlene Dory [phone]) on 07/24/2023 9:46:50 AM   Vascular ultrasound lower extremity venous bilateral (04/21/2023): Summary:  RIGHT:  - Findings consistent with acute deep vein thrombosis involving the right  peroneal veins.  - No cystic structure found in the popliteal fossa.    LEFT:  - There is no evidence of deep vein thrombosis in the lower extremity.    - No cystic structure found in the popliteal fossa.  Echo 03/2023: 1. Left ventricular ejection fraction, by estimation, is 60 to 65%. The  left ventricle has normal function. The left ventricle has no regional  wall motion abnormalities. Left ventricular diastolic parameters were  normal.   2. Right ventricular systolic function is normal. The right ventricular  size is normal.   3. Left atrial size was mildly dilated.   4. The mitral valve is abnormal. Trivial mitral valve regurgitation. No  evidence of  mitral stenosis.   5. The aortic valve is tricuspid. There is moderate calcification of the  aortic valve. There is moderate thickening of the aortic valve. Aortic  valve regurgitation is mild. Aortic valve sclerosis is present, with no  evidence of aortic valve stenosis.   6. The inferior vena cava is normal in size with greater than 50%  respiratory variability, suggesting right atrial pressure of 3 mmHg.  Cardiac monitor 11/2022: Predominant rhythm is sinus with heart rate ranging from 53 bpm up to 145 bpm and average heart rate 77 bpm. There were rare PACs including atrial couplets and triplets representing less than 1% total beats. There were rare PVCs including ventricular couplets representing less than 1% total beats.  Also limited episodes of ventricular bigeminy and trigeminy. There were two brief episodes of SVT, the longest of which lasted for 7 beats.  Neither of these were patient triggered events. No sustained arrhythmias or pauses.  CCTA 11/2022: IMPRESSION: 1. Mild nonobstructive CAD, CADRADS = 2.   2. Coronary calcium score of 122. This was 71st percentile for age and sex matched control.   3. Normal coronary origin with right dominance.   4.  Hiatal hernia present.   5.  Borderline dilation of ascending aorta at 39 mm.  Lexiscan 07/2018: There was no ST segment deviation noted during stress. The study is normal. There are no perfusion defects consistent with  prior infarct or ischemia. The left ventricular ejection fraction is hyperdynamic (>65%). This is a low risk study.  Physical Exam:   VS:  BP 110/70   Pulse 91   Ht 5\' 2"  (1.575 m)   Wt 147 lb 3.2 oz (66.8 kg)   SpO2 97%   BMI 26.92 kg/m    Wt Readings from Last 3 Encounters:  07/24/23 147 lb 3.2 oz (66.8 kg)  07/12/23 140 lb 3.2 oz (63.6 kg)  07/03/23 159 lb (72.1 kg)    GEN: Well nourished, well developed in no acute distress NECK: No JVD; No carotid bruits CARDIAC: S1/S2, RRR, no murmurs, rubs,  gallops RESPIRATORY:  Clear to auscultation without rales, wheezing or rhonchi  ABDOMEN: Soft, non-tender, non-distended EXTREMITIES:  2+ pitting edema to BLE (compression stockings to BLE - were taken off for better assessment), weeping clear, with some blood tinged discharge from scab to anterior portion of left lower leg; No deformity   ASSESSMENT AND PLAN: .    Hypotension Recent BP improvement with SBP averaging low -110's. Continue midodrine and florinef.  Discussed to monitor BP at home at least 2 hours after medications and sitting for 5-10 minutes. Given BP log today. Care and ED precautions discussed. Continue to follow with PCP.  Acute PE/DVT  CT of abdomen 04/2023 revealed PE of right interlobar pulmonary artery and proximal and subsegmental pulmonary arteries. Etiology felt to be aggravated by recent surgery and acute DVT noted along right peroneal veins. Tolerating Eliquis well and on appropriate dosage. Continue Eliquis and continue to follow-up with Hematology/Oncology as scheduled.   Aortic dilatation CCTA 11/2022 showed borderline dilatation of ascending aorta at 39 mm. Recommend repeating imaging in 1 year for monitoring. Care and ED precautions discussed. Continue to follow with PCP.  Chronic mesenteric ischemia Denies any recent symptoms. Currently being followed by general surgery. Continue to follow-up with Dr. Henreitta Leber as scheduled.   Leg swelling, medication management, wounds along BLE Some improvement per her report, was wearing her compression stockings during exam, however left stocking was becoming saturated with clear/blood tinged discharge d/t weeping skin. No signs of cellulitis on exam. Compression stockings removed bilaterally and wound care performed. Echo 03/2023 showed EF 60-65%. Leg edema secondary d/t low albumin from GI surgery. Will increase Lasix to 20 mg BID x 3 days, then return to normal dosing, she will verify with [place] which dosage of potassium  supplementation she is on as there appeared to be some confusion (stated protocol is to double potassium), she will check which dose she is on first. Will place referral to wound care clinic outpatient for further management as she has scattered scabs along anterior BLE from what appear to be possibly former blisters, weeping noted to LLE.  Salty six diet sheet given, leg elevation discussed. Will continue to monitor. Heart healthy diet encouraged. Continue to follow with PCP. Will obtain BMET and Mag in 1 week.   6. Palpitations Does admit to rare, brief palpitations since last visit. Overall sounds benign, but pt states nursing staff noted A-fib on telemetry. Do not see documentation regarding this from last hospitalization. Will arrange 2 week Zio XT monitor for further evaluation. EKG confirms NSR today.   7. Pre-operative cardiovascular risk assessment Patient states she is scheduled for pending EGD tomorrow. Has not been holding Eliquis. Discussed at this time that she needs to continue Eliquis uninterrupted and EGD will need to be rescheduled. Will route note to Dr. Marletta Lor so he is aware.  Dispo: Follow-up with me or APP in 3-4 weeks or sooner if anything changes.    Signed, Sharlene Dory, NP

## 2023-07-24 NOTE — Telephone Encounter (Signed)
Call placed to patient.   Reports that she has started the Carafate and Protonix on 07/20/2023, but has not noted any improvements at this time.

## 2023-07-25 ENCOUNTER — Other Ambulatory Visit (HOSPITAL_COMMUNITY): Payer: Medicare Other

## 2023-07-25 NOTE — Telephone Encounter (Signed)
Patient aware of OV tomorrow at [phone] with Dr Marletta Lor

## 2023-07-26 ENCOUNTER — Encounter: Payer: Self-pay | Admitting: *Deleted

## 2023-07-26 ENCOUNTER — Encounter: Payer: Self-pay | Admitting: Internal Medicine

## 2023-07-26 ENCOUNTER — Ambulatory Visit: Payer: Medicare Other | Admitting: Nurse Practitioner

## 2023-07-26 ENCOUNTER — Inpatient Hospital Stay: Payer: Medicare Other | Attending: Oncology

## 2023-07-26 ENCOUNTER — Ambulatory Visit: Payer: Medicare Other | Admitting: Internal Medicine

## 2023-07-26 ENCOUNTER — Other Ambulatory Visit: Payer: Self-pay | Admitting: Nurse Practitioner

## 2023-07-26 VITALS — BP 116/79 | HR 89 | Temp 97.7°F | Ht 62.0 in | Wt 144.8 lb

## 2023-07-26 DIAGNOSIS — Z86711 Personal history of pulmonary embolism: Secondary | ICD-10-CM | POA: Diagnosis present

## 2023-07-26 DIAGNOSIS — R600 Localized edema: Secondary | ICD-10-CM | POA: Insufficient documentation

## 2023-07-26 DIAGNOSIS — R1319 Other dysphagia: Secondary | ICD-10-CM

## 2023-07-26 DIAGNOSIS — K219 Gastro-esophageal reflux disease without esophagitis: Secondary | ICD-10-CM

## 2023-07-26 DIAGNOSIS — Z9181 History of falling: Secondary | ICD-10-CM | POA: Diagnosis not present

## 2023-07-26 DIAGNOSIS — Z86718 Personal history of other venous thrombosis and embolism: Secondary | ICD-10-CM | POA: Insufficient documentation

## 2023-07-26 DIAGNOSIS — R1033 Periumbilical pain: Secondary | ICD-10-CM

## 2023-07-26 DIAGNOSIS — K529 Noninfective gastroenteritis and colitis, unspecified: Secondary | ICD-10-CM | POA: Diagnosis not present

## 2023-07-26 DIAGNOSIS — D649 Anemia, unspecified: Secondary | ICD-10-CM

## 2023-07-26 LAB — CBC WITH DIFFERENTIAL/PLATELET
Abs Immature Granulocytes: 0.04 10*3/uL (ref 0.00–0.07)
Basophils Absolute: 0.1 10*3/uL (ref 0.0–0.1)
Basophils Relative: 1 %
Eosinophils Absolute: 0.1 10*3/uL (ref 0.0–0.5)
Eosinophils Relative: 1 %
HCT: 36.8 % (ref 36.0–46.0)
Hemoglobin: 11.4 g/dL — ABNORMAL LOW (ref 12.0–15.0)
Immature Granulocytes: 1 %
Lymphocytes Relative: 49 %
Lymphs Abs: 2.8 10*3/uL (ref 0.7–4.0)
MCH: 31.6 pg (ref 26.0–34.0)
MCHC: 31 g/dL (ref 30.0–36.0)
MCV: 101.9 fL — ABNORMAL HIGH (ref 80.0–100.0)
Monocytes Absolute: 0.5 10*3/uL (ref 0.1–1.0)
Monocytes Relative: 9 %
Neutro Abs: 2.2 10*3/uL (ref 1.7–7.7)
Neutrophils Relative %: 39 %
Platelets: 312 10*3/uL (ref 150–400)
RBC: 3.61 MIL/uL — ABNORMAL LOW (ref 3.87–5.11)
RDW: 15.4 % (ref 11.5–15.5)
WBC: 5.5 10*3/uL (ref 4.0–10.5)
nRBC: 0 % (ref 0.0–0.2)

## 2023-07-26 LAB — VITAMIN B12: Vitamin B-12: 1051 pg/mL — ABNORMAL HIGH (ref 180–914)

## 2023-07-26 LAB — FOLATE: Folate: 19.8 ng/mL (ref >5.9)

## 2023-07-26 MED ORDER — FLUDROCORTISONE ACETATE 0.1 MG PO TABS: 0.1000 mg | ORAL_TABLET | Freq: Every day | ORAL | 6 refills | Status: DC

## 2023-07-26 MED ORDER — FUROSEMIDE 20 MG PO TABS: 10.0000 mg | ORAL_TABLET | Freq: Two times a day (BID) | ORAL | 0 refills | Status: DC

## 2023-07-26 NOTE — Progress Notes (Signed)
Primary Care Physician:  Billie Lade, MD Primary Gastroenterologist:  Dr. Marletta Lor  Chief Complaint  Patient presents with   New Patient (Initial Visit)    Pt being seen for a lot of abd issues. In and out of hospital. [facility] pt and Dr Henreitta Leber has been seeing her    HPI:   Kayla Dean is a 72 y.o. female who presents to clinic today by referral from her general surgeon Dr. Henreitta Leber for evaluation for enteritis. Complicated history as below:  Previous surgical history:  Paraesophageal hernia repair with Niesen fundoplication 12/17/2013.  Laparoscopic gastrojejunostomy for recurrent GERD and paraesophageal hernia 09/19/2017.  09/19/2018 acquired an obstruction at the JJ anastomosis requiring resection of old JJ and 2 entero-enterostomy's.  05/15/2018 laparoscopic cholecystectomy.  10/11/2018 open ventral hernia repair, incisional hernia repair  02/02/2023 presented with incarcerated hernia with subsequent exploratory laparotomy, reduction of incarcerated hernia, hernia repair, explant of mesh, closure of mesenteric defect.  Found to have ischemic bowel from the alimentary JJ anastomosis to the JJ bilobed pancreatic anastomosis though bowel appeared pink and viable.  Colonoscopy 05/26/2022 1 diminutive polyp transverse colon, diverticulosis of the sigmoid colon, external and internal hemorrhoids.  Has multiple GI complaints for me today.  Notably she has periumbilical abdominal pain.  Constant, all the time, occasionally worse after meals.  Also with chronic diarrhea.  States she never has a formed bowel movement.  States she on average has 3-4 loose bowel movements daily.  Abdominal pain does not improve after BM.  Does note some rectal bleeding which she attributes to hemorrhoids.  Bright red blood, no clots.  Also with worsening esophageal dysphagia.  Feels as though food gets stuck in her epigastric region when she eats.  Occasional food regurgitation/vomiting.  CT abdomen pelvis  with contrast 07/05/2023 which I personally reviewed showed moderate size hiatal hernia, gastrojejunostomy, stool throughout the colon, mildly dilated jejunal loop noted with wall thickening and surrounding inflammatory changes concerning for enteritis.  Consider mesenteric ischemia.  Following with Dr. Henreitta Leber who has discussed case with bariatric surgery who is recommending upper GI series as well as upper endoscopy to evaluate.  Patient scheduled for upper GI series 08/10/2023.  Past Medical History:  Diagnosis Date   Aortic aneurysm (HCC)    Arthritis    H/O gastric bypass    Hiatal hernia    Hypothyroidism    Small bowel obstruction (HCC)     Past Surgical History:  Procedure Laterality Date   ABDOMINAL HYSTERECTOMY     CHOLECYSTECTOMY     COLON SURGERY     HERNIA REPAIR     LAPAROTOMY N/A 02/02/2023   Procedure: EXPLORATORY LAPAROTOMY, [name], EXPLANT OF ABDOMINAL MESH;  Surgeon: Lucretia Roers, MD;  Location: [facility];  Service: General;  Laterality: N/A;   REPLACEMENT TOTAL KNEE Right    ROTATOR CUFF REPAIR Right    TOTAL HIP ARTHROPLASTY Right     Current Outpatient Medications  Medication Sig Dispense Refill   acetaminophen (TYLENOL) 500 MG tablet Take 1,000 mg by mouth every 8 (eight) hours as needed for moderate pain.     apixaban (ELIQUIS) 5 MG TABS tablet Take 1 tablet (5 mg total) by mouth 2 (two) times daily. 60 tablet 2   Calcium Carb-Cholecalciferol (CALCIUM 600 + D) 600-200 MG-UNIT TABS Take 1 tablet by mouth every morning.     Cholecalciferol (VITAMIN D-3 PO) Take 1 capsule by mouth daily.     Elastic Bandages &  Supports (ABDOMINAL BINDER/ELASTIC MED) MISC Apply as directed for orthostatic hypotension 1 each 1   fludrocortisone (FLORINEF) 0.1 MG tablet Take 1 tablet (0.1 mg total) by mouth daily. 30 tablet 6   furosemide (LASIX) 20 MG tablet Take 1 tablet (20 mg total) by mouth daily. 90 tablet 3   Lactobacillus (PROBIOTIC  ACIDOPHILUS) CAPS Take 1 capsule by mouth 2 (two) times daily.     levothyroxine (SYNTHROID, LEVOTHROID) 137 MCG tablet Take 137 mcg by mouth daily before breakfast.     midodrine (PROAMATINE) 10 MG tablet Take 1 tablet (10 mg total) by mouth 3 (three) times daily with meals. 90 tablet 0   Multiple Vitamin (MULTIVITAMIN WITH MINERALS) TABS tablet Take 1 tablet by mouth every morning.     Nutritional Supplements (,FEEDING SUPPLEMENT, PROSOURCE PLUS) liquid Take 30 mLs by mouth 2 (two) times daily between meals. 887 mL 1   ondansetron (ZOFRAN-ODT) 4 MG disintegrating tablet Take 1 tablet (4 mg total) by mouth every 6 (six) hours as needed for nausea. 20 tablet 0   oxyCODONE (OXY IR/ROXICODONE) 5 MG immediate release tablet Take 1 tablet (5 mg total) by mouth every 4 (four) hours as needed for severe pain or breakthrough pain. 20 tablet 0   pantoprazole (PROTONIX) 40 MG tablet Take 1 tablet (40 mg total) by mouth 2 (two) times daily. 60 tablet 2   potassium chloride (KLOR-CON M) 10 MEQ tablet Take 1 tablet (10 mEq total) by mouth daily. 7 tablet 0   saccharomyces boulardii (FLORASTOR) 250 MG capsule Take 1 capsule (250 mg total) by mouth 2 (two) times daily. 60 capsule 0   sucralfate (CARAFATE) 1 g tablet Take 1 tablet (1 g total) by mouth 4 (four) times daily -  with meals and at bedtime. 120 tablet 2   zolpidem (AMBIEN) 5 MG tablet Take 1 tablet (5 mg total) by mouth at bedtime as needed for sleep. 30 tablet 0   furosemide (LASIX) 20 MG tablet Take 0.5 tablets (10 mg total) by mouth 2 (two) times daily for 3 days. (Patient not taking: Reported on 07/26/2023) 10 tablet 0   vancomycin (VANCOCIN) 125 MG capsule Take 1 capsule (125 mg total) by mouth 4 (four) times daily for 10 days. (Patient not taking: Reported on 07/26/2023) 40 capsule 0   No current facility-administered medications for this visit.    Allergies as of 07/26/2023   (No Known Allergies)    Family History  Problem Relation Age of  Onset   CAD Father    CAD Brother     Social History   Socioeconomic History   Marital status: Widowed    Spouse name: Not on file   Number of children: Not on file   Years of education: Not on file   Highest education level: Not on file  Occupational History   Not on file  Tobacco Use   Smoking status: Never   Smokeless tobacco: Never  Vaping Use   Vaping status: Never Used  Substance and Sexual Activity   Alcohol use: Never   Drug use: Never   Sexual activity: Not on file  Other Topics Concern   Not on file  Social History Narrative   Not on file   Social Determinants of Health   Financial Resource Strain: Not on file  Food Insecurity: No Food Insecurity (06/25/2023)   Hunger Vital Sign    Worried About Running Out of Food in the Last Year: Never true    Ran Out of  Food in the Last Year: Never true  Transportation Needs: No Transportation Needs (06/25/2023)   PRAPARE - Administrator, Civil Service (Medical): No    Lack of Transportation (Non-Medical): No  Physical Activity: Not on file  Stress: Not on file  Social Connections: Not on file  Intimate Partner Violence: Not At Risk (06/25/2023)   Humiliation, Afraid, Rape, and Kick questionnaire    Fear of Current or Ex-Partner: No    Emotionally Abused: No    Physically Abused: No    Sexually Abused: No    Subjective: Review of Systems  Constitutional:  Negative for chills and fever.  HENT:  Negative for congestion and hearing loss.   Eyes:  Negative for blurred vision and double vision.  Respiratory:  Negative for cough and shortness of breath.   Cardiovascular:  Negative for chest pain and palpitations.  Gastrointestinal:  Positive for abdominal pain, diarrhea, heartburn and vomiting. Negative for blood in stool, constipation and melena.  Genitourinary:  Negative for dysuria and urgency.  Musculoskeletal:  Negative for joint pain and myalgias.  Skin:  Negative for itching and rash.  Neurological:   Negative for dizziness and headaches.  Psychiatric/Behavioral:  Negative for depression. The patient is not nervous/anxious.        Objective: BP 116/79   Pulse 89   Temp 97.7 F (36.5 C)   Ht 5\' 2"  (1.575 m)   Wt 144 lb 12.8 oz (65.7 kg)   BMI 26.48 kg/m  Physical Exam Constitutional:      Appearance: Normal appearance.  HENT:     Head: Normocephalic and atraumatic.  Eyes:     Extraocular Movements: Extraocular movements intact.     Conjunctiva/sclera: Conjunctivae normal.  Cardiovascular:     Rate and Rhythm: Normal rate and regular rhythm.  Pulmonary:     Effort: Pulmonary effort is normal.     Breath sounds: Normal breath sounds.  Abdominal:     General: Bowel sounds are normal.     Palpations: Abdomen is soft.  Musculoskeletal:        General: No swelling. Normal range of motion.     Cervical back: Normal range of motion and neck supple.  Skin:    General: Skin is warm and dry.     Coloration: Skin is not jaundiced.  Neurological:     General: No focal deficit present.     Mental Status: She is alert and oriented to person, place, and time.  Psychiatric:        Mood and Affect: Mood normal.        Behavior: Behavior normal.      Assessment/Plan:  1.  Enteritis-discussed in depth with patient today her significantly altered upper GI anatomy as well as findings of enteritis on most recent CT.  Also discussed her case with Dr. Henreitta Leber of surgery.  Will schedule for EGD/enteroscopy to further evaluate.  May perform esophageal dilation depending on findings given her worsening dysphagia.  Proceed with upper GI series as previously scheduled.  May consider CT angiogram to rule out mesenteric ischemia depending on EGD/enteroscopy findings.  2.  Chronic diarrhea-may be due to above.  Does have moderate amount of stool on recent CT scan.  Possibly overflow diarrhea in the setting of chronic constipation.  Trial of fiber therapy.  3.  Esophageal dysphagia-may  perform esophageal dilation depending on findings as above.  4.  Chronic GERD-well-controlled on PPI twice daily.  Will continue.  Thank you Dr. Henreitta Leber for  the kind referral.  07/26/2023 8:55 AM   Disclaimer: This note was dictated with voice recognition software. Similar sounding words can inadvertently be transcribed and may not be corrected upon review.

## 2023-07-26 NOTE — Patient Instructions (Signed)
We will schedule you for upper endoscopy/small bowel enteroscopy to further evaluate your enteritis, dysphagia, abdominal pain.  I may elect to stretch your esophagus depending on findings.  You will need to hold your Eliquis x 2 days prior.  Continue on pantoprazole twice daily.  It was very nice meeting both you today.  Dr. Marletta Lor

## 2023-07-27 ENCOUNTER — Telehealth: Payer: Self-pay | Admitting: Nurse Practitioner

## 2023-07-27 ENCOUNTER — Other Ambulatory Visit: Payer: Self-pay | Admitting: Nurse Practitioner

## 2023-07-27 MED ORDER — POTASSIUM CHLORIDE CRYS ER 10 MEQ PO TBCR: 10.0000 meq | EXTENDED_RELEASE_TABLET | Freq: Every day | ORAL | 0 refills | Status: DC

## 2023-07-27 MED ORDER — MIDODRINE HCL 10 MG PO TABS: 10.0000 mg | ORAL_TABLET | Freq: Three times a day (TID) | ORAL | 0 refills | Status: DC

## 2023-07-27 NOTE — Telephone Encounter (Signed)
*  STAT* If patient is at the pharmacy, call can be transferred to refill team.   1. Which medications need to be refilled? (please list name of each medication and dose if known)   midodrine (PROAMATINE) 10 MG tablet    2. Which pharmacy/location (including street and city if local pharmacy) is medication to be sent to?  [facility] - [place], [place] - [address]      3. Do they need a 30 day or 90 day supply? 90 day    Pt is out of medication

## 2023-07-27 NOTE — Telephone Encounter (Signed)
Clarified with pharmacy and sent med refills that was needed for heart medications meds corrected in med list

## 2023-07-27 NOTE — Telephone Encounter (Signed)
filled

## 2023-07-27 NOTE — Telephone Encounter (Signed)
Caller Cala Bradford) wants to confirm patient should also be taking potassium chloride (KLOR-CON M) 10 MEQ tablet.

## 2023-07-30 ENCOUNTER — Telehealth: Payer: Self-pay | Admitting: Internal Medicine

## 2023-07-30 NOTE — Telephone Encounter (Signed)
Kayla Dean called from Physical Therapist Frances Furbish [phone] needs to speak to nurse about pain control of abdominal.  As as possible

## 2023-07-31 ENCOUNTER — Ambulatory Visit: Payer: Medicare Other | Admitting: General Surgery

## 2023-07-31 NOTE — Telephone Encounter (Signed)
Thayer Ohm PT from [facility] contacted office for patient. States patient is having LRQ abdominal pain x 3 weeks. She can not have NSAID , and she was only prescribed 10- Oxycodone 5 mg tabs, which she is out of. Pt. Recently had surgery w/ Algis Greenhouse. Who was the provider who originally prescribed the Oxycodone. Referred pt. Back to Dr. Henreitta Leber for further treatment.

## 2023-08-02 ENCOUNTER — Other Ambulatory Visit (HOSPITAL_COMMUNITY)
Admission: RE | Admit: 2023-08-02 | Discharge: 2023-08-02 | Disposition: A | Discharge status: Home / Self Care | Payer: Medicare Other | Source: Ambulatory Visit | Attending: Nurse Practitioner | Admitting: Nurse Practitioner

## 2023-08-02 ENCOUNTER — Inpatient Hospital Stay: Payer: Medicare Other | Admitting: Oncology

## 2023-08-02 ENCOUNTER — Encounter: Payer: Self-pay | Admitting: Oncology

## 2023-08-02 VITALS — BP 90/70 | HR 90 | Temp 98.7°F | Resp 16 | Wt 144.4 lb

## 2023-08-02 DIAGNOSIS — R600 Localized edema: Secondary | ICD-10-CM

## 2023-08-02 DIAGNOSIS — Z79899 Other long term (current) drug therapy: Secondary | ICD-10-CM | POA: Insufficient documentation

## 2023-08-02 DIAGNOSIS — Z86718 Personal history of other venous thrombosis and embolism: Secondary | ICD-10-CM | POA: Diagnosis not present

## 2023-08-02 DIAGNOSIS — I4891 Unspecified atrial fibrillation: Secondary | ICD-10-CM

## 2023-08-02 DIAGNOSIS — I2699 Other pulmonary embolism without acute cor pulmonale: Secondary | ICD-10-CM | POA: Insufficient documentation

## 2023-08-02 DIAGNOSIS — I2693 Single subsegmental pulmonary embolism without acute cor pulmonale: Secondary | ICD-10-CM

## 2023-08-02 DIAGNOSIS — I8290 Acute embolism and thrombosis of unspecified vein: Secondary | ICD-10-CM

## 2023-08-02 DIAGNOSIS — I959 Hypotension, unspecified: Secondary | ICD-10-CM | POA: Insufficient documentation

## 2023-08-02 LAB — BASIC METABOLIC PANEL
Anion gap: 6 (ref 5–15)
BUN: 13 mg/dL (ref 8–23)
CO2: 29 mmol/L (ref 22–32)
Calcium: 7.3 mg/dL — ABNORMAL LOW (ref 8.9–10.3)
Chloride: 102 mmol/L (ref 98–111)
Creatinine, Ser: 0.55 mg/dL (ref 0.44–1.00)
GFR, Estimated: >60 mL/min (ref >60)
Glucose, Bld: 95 mg/dL (ref 70–99)
Potassium: 3.6 mmol/L (ref 3.5–5.1)
Sodium: 137 mmol/L (ref 135–145)

## 2023-08-02 LAB — MAGNESIUM: Magnesium: 1.8 mg/dL (ref 1.7–2.4)

## 2023-08-02 LAB — BRAIN NATRIURETIC PEPTIDE: B Natriuretic Peptide: 91 pg/mL (ref 0.0–100.0)

## 2023-08-02 NOTE — Patient Instructions (Signed)
VISIT SUMMARY:  During your recent visit, we discussed your recovery from cellulitis and sepsis, your completion of anticoagulation therapy, your leg wound, and the possibility of atrial fibrillation. You've shown significant improvement since your hospitalization, and you're now able to walk with a cane without recent falls. You've also completed your course of antibiotics for the cellulitis and sepsis.  YOUR PLAN:  -VENOUS THROMBOEMBOLISM: You've completed your three-month course of Eliquis for your blood clot. we can now stop this medication.  -CELLULITIS AND SEPSIS: You've successfully completed your antibiotic treatment for the skin infection and blood infection. No further action is needed at this time.  -LOWER EXTREMITY EDEMA: Despite the swelling in your legs, your wound on the left leg is healing. Continue taking your water pill and make sure to attend your wound care appointment.  -POSSIBLE ATRIAL FIBRILLATION: You're currently wearing a heart monitor to check for an irregular heartbeat. Once we have the results, we may consider restarting Eliquis if an irregular heartbeat is confirmed.  INSTRUCTIONS:  You don't need any further follow-up with this specialty service. Continue with your primary care and attend your scheduled appointments with the wound care specialist and cardiologist. If you have any concerns or if your condition changes, please contact your primary care provider.

## 2023-08-02 NOTE — Assessment & Plan Note (Signed)
Persistent despite diuretic use. Wound on left medial leg, currently scabbed over. -Continue current diuretic regimen. -Attend scheduled wound care appointment.

## 2023-08-02 NOTE — Progress Notes (Signed)
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 She was diagnosed of acute PE and DVT during a long ICU admission and is following up for the same.   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 Completed 3 months of anticoagulation with Eliquis following provoked clot. Improved mobility with cane use. No recent falls. -Discussed risk versus benefits in detail and continuing Eliquis and I think her risks of bleeding outweigh benefits at this time so, will discontinue Eliquis.  Patient does not need further follow-up with hematology for this concern.  Recommend follow-up with primary care and recommended patient to reach out to [place] with any further concerns  Afib [facility]) Possible Atrial Fibrillation Recent hospital staff queried about possible AFib. Currently wearing heart monitor for further evaluation. -Await results from heart monitor. If AFib confirmed, consider restarting Eliquis.  Lower extremity edema Persistent despite diuretic use. Wound on left medial leg, currently scabbed over. -Continue current diuretic regimen. -Attend scheduled wound care appointment.   The total time spent in the appointment was 15 minutes encounter with patients including review of chart and various tests results, discussions about plan of care and coordination of care plan   All questions were answered. The patient knows to call the clinic with any problems, questions or concerns. No barriers to learning was detected.  Cindie Crumbly, MD 10/17/202410:22 AM   SUMMARY OF HEMATOLOGIC HISTORY:  PE 1st episode: 04/13/23 Location: right interlobar pulmonary artery and proximal segmental and subsegmental pulmonary  arteries. Risk factors: post surgery, acute illness-sepsis and ICU bound Rx: Eliquis since 04/26/23   DVT 1st episode: 04/15/23 Location: Right peroneal vein Risk factors: post surgery, acute illness-sepsis and IOCU bound Rx: Eliquis since 04/26/23   INTERVAL HISTORY: Kayla Dean 72 y.o. female is here for follow-up for DVT and PE.  She is accompanied by her daughter today. She reports significant improvement since hospitalization, now ambulating with a cane and no recent falls. She was treated with antibiotics for the cellulitis and sepsis.  She also has a history of a provoked clot for which she was prescribed Eliquis. She has completed three months of anticoagulation therapy and is eager to discontinue the medication.  She has a history of GI issues and has been experiencing diarrhea for the past four months. She is scheduled for an endoscopy in November. She has a wound on her left leg, which is scabbed over. She is scheduled to see a wound care specialist.The patient is also wearing a heart monitor due to concerns raised by nurses during her hospital stay about possible AFib. She is scheduled to see her cardiologist in November for follow-up.  I have reviewed the past medical history, past surgical history, social history and family history with the patient   ALLERGIES:  has No Known Allergies.  MEDICATIONS:  Current Outpatient Medications  Medication Sig Dispense Refill   ascorbic acid (VITAMIN C) 500 MG tablet Take 500 mg by mouth daily.     BIOTIN 5000 PO Take by mouth.     Calcium Carb-Cholecalciferol (CALCIUM 600 + D) 600-200 MG-UNIT TABS Take 1 tablet by mouth every morning.     Cholecalciferol (VITAMIN D-3 PO) Take 1 capsule by mouth daily.     Elastic Bandages & Supports (  ABDOMINAL BINDER/ELASTIC MED) MISC Apply as directed for orthostatic hypotension 1 each 1   fludrocortisone (FLORINEF) 0.1 MG tablet Take 1 tablet (0.1 mg total) by mouth daily. 30 tablet 6   furosemide  (LASIX) 20 MG tablet Take 1 tablet (20 mg total) by mouth daily. 90 tablet 3   Lactobacillus (PROBIOTIC ACIDOPHILUS) CAPS Take 1 capsule by mouth 2 (two) times daily.     levothyroxine (SYNTHROID, LEVOTHROID) 137 MCG tablet Take 137 mcg by mouth daily before breakfast.     midodrine (PROAMATINE) 10 MG tablet Take 1 tablet (10 mg total) by mouth 3 (three) times daily with meals. 270 tablet 0   Multiple Vitamin (MULTIVITAMIN WITH MINERALS) TABS tablet Take 1 tablet by mouth every morning.     Nutritional Supplements (,FEEDING SUPPLEMENT, PROSOURCE PLUS) liquid Take 30 mLs by mouth 2 (two) times daily between meals. 887 mL 1   pantoprazole (PROTONIX) 40 MG tablet Take 1 tablet (40 mg total) by mouth 2 (two) times daily. 60 tablet 2   potassium chloride (KLOR-CON M) 10 MEQ tablet Take 1 tablet (10 mEq total) by mouth daily. 90 tablet 0   saccharomyces boulardii (FLORASTOR) 250 MG capsule Take 1 capsule (250 mg total) by mouth 2 (two) times daily. 60 capsule 0   sucralfate (CARAFATE) 1 g tablet Take 1 tablet (1 g total) by mouth 4 (four) times daily -  with meals and at bedtime. 120 tablet 2   ondansetron (ZOFRAN-ODT) 4 MG disintegrating tablet Take 1 tablet (4 mg total) by mouth every 6 (six) hours as needed for nausea. (Patient not taking: Reported on 08/02/2023) 20 tablet 0   oxyCODONE (OXY IR/ROXICODONE) 5 MG immediate release tablet Take 1 tablet (5 mg total) by mouth every 4 (four) hours as needed for severe pain or breakthrough pain. (Patient not taking: Reported on 08/02/2023) 20 tablet 0   zolpidem (AMBIEN) 5 MG tablet Take 1 tablet (5 mg total) by mouth at bedtime as needed for sleep. (Patient not taking: Reported on 08/02/2023) 30 tablet 0   No current facility-administered medications for this visit.     REVIEW OF SYSTEMS:   Constitutional: Denies fevers, chills or night sweats Eyes: Denies blurriness of vision Ears, nose, mouth, throat, and face: Denies mucositis or sore  throat Respiratory: Denies cough, dyspnea or wheezes Cardiovascular: Denies palpitation, chest discomfort or lower extremity swelling Gastrointestinal:  Denies nausea, heartburn or change in bowel habits Skin: Denies abnormal skin rashes Lymphatics: Denies new lymphadenopathy or easy bruising Neurological:Denies numbness, tingling or new weaknesses Behavioral/Psych: Mood is stable, no new changes  All other systems were reviewed with the patient and are negative.  PHYSICAL EXAMINATION:   Vitals:   08/02/23 0950  BP: 90/70  Pulse: 90  Resp: 16  Temp: 98.7 F (37.1 C)  SpO2: 98%    GENERAL:alert, no distress and comfortable SKIN: Wound on left leg, medial side, scabbed over. LUNGS: clear to auscultation and percussion with normal breathing effort HEART: regular rate & rhythm and no murmurs and bilateral pitting pedal edema up to the thigh ABDOMEN:abdomen soft, non-tender and normal bowel sounds Musculoskeletal:no cyanosis of digits and no clubbing  NEURO: alert & oriented x 3 with fluent speech  LABORATORY DATA:  I have reviewed the data as listed  Lab Results  Component Value Date   WBC 5.5 07/26/2023   NEUTROABS 2.2 07/26/2023   HGB 11.4 (L) 07/26/2023   HCT 36.8 07/26/2023   MCV 101.9 (H) 07/26/2023   PLT 312 07/26/2023  Component Value Date/Time   NA 139 07/12/2023 1605   K 3.9 07/12/2023 1605   CL 102 07/12/2023 1605   CO2 26 07/12/2023 1605   GLUCOSE 78 07/12/2023 1605   GLUCOSE 79 06/28/2023 0427   BUN 16 07/12/2023 1605   CREATININE 0.45 (L) 07/12/2023 1605   CREATININE 0.56 (L) 02/15/2023 1556   CALCIUM 7.3 (L) 07/12/2023 1605   PROT 4.5 (L) 07/12/2023 1605   ALBUMIN 2.0 (L) 07/12/2023 1605   AST 19 07/12/2023 1605   ALT 18 07/12/2023 1605   ALKPHOS 71 07/12/2023 1605   BILITOT <0.2 07/12/2023 1605   GFRNONAA >60 06/28/2023 0427   GFRAA >60 08/14/2018 0525        Chemistry      Component Value Date/Time   NA 139 07/12/2023 1605   K 3.9  07/12/2023 1605   CL 102 07/12/2023 1605   CO2 26 07/12/2023 1605   BUN 16 07/12/2023 1605   CREATININE 0.45 (L) 07/12/2023 1605   CREATININE 0.56 (L) 02/15/2023 1556      Component Value Date/Time   CALCIUM 7.3 (L) 07/12/2023 1605   ALKPHOS 71 07/12/2023 1605   AST 19 07/12/2023 1605   ALT 18 07/12/2023 1605   BILITOT <0.2 07/12/2023 1605

## 2023-08-02 NOTE — Assessment & Plan Note (Signed)
Possible Atrial Fibrillation Recent hospital staff queried about possible AFib. Currently wearing heart monitor for further evaluation. -Await results from heart monitor. If AFib confirmed, consider restarting Eliquis.

## 2023-08-02 NOTE — Assessment & Plan Note (Deleted)
Completed 3 months of anticoagulation with Eliquis following provoked clot. Improved mobility with cane use. No recent falls. -Discussed risk versus benefits in detail and continuing Eliquis and I think her risks of bleeding outweigh benefits at this time so, will discontinue Eliquis.

## 2023-08-02 NOTE — Assessment & Plan Note (Addendum)
History of DVT and PE after prolonged ICU stay. Completed 3 months of anticoagulation with Eliquis following provoked clot. Improved mobility with cane use. No recent falls. -Discussed risk versus benefits in detail and continuing Eliquis and I think her risks of bleeding outweigh benefits at this time so, will discontinue Eliquis.  Patient does not need further follow-up with hematology for this concern.  Recommend follow-up with primary care and recommended patient to reach out to [place] with any further concerns

## 2023-08-03 ENCOUNTER — Telehealth: Payer: Self-pay | Admitting: Internal Medicine

## 2023-08-03 NOTE — Telephone Encounter (Signed)
Kayla Dean [facility] need verbal order to extend home health pt 1 week.  Call back # [phone]

## 2023-08-06 NOTE — Telephone Encounter (Signed)
Verbal orders given  

## 2023-08-09 ENCOUNTER — Ambulatory Visit: Payer: Medicare Other | Admitting: General Surgery

## 2023-08-10 ENCOUNTER — Ambulatory Visit (HOSPITAL_COMMUNITY)
Admission: RE | Admit: 2023-08-10 | Discharge: 2023-08-10 | Disposition: A | Discharge status: Home / Self Care | Payer: Medicare Other | Source: Ambulatory Visit | Attending: General Surgery

## 2023-08-10 ENCOUNTER — Telehealth (INDEPENDENT_AMBULATORY_CARE_PROVIDER_SITE_OTHER): Payer: Medicare Other | Admitting: General Surgery

## 2023-08-10 DIAGNOSIS — Z9884 Bariatric surgery status: Secondary | ICD-10-CM | POA: Insufficient documentation

## 2023-08-10 DIAGNOSIS — K639 Disease of intestine, unspecified: Secondary | ICD-10-CM

## 2023-08-10 NOTE — Telephone Encounter (Signed)
[facility]  UGI, SBFT done today. 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  I have reached out to Dr. Andrey Campanile, Bariatric surgeon, who was looking at her chart to see what he thinks. I have let him know that this new information is available.  I tried to call the patient to update her and left her a message that we would be in touch. Will try to call her back next week.   Algis Greenhouse, MD [facility] [address] [place] [place], [place] [address] [phone] (office)

## 2023-08-14 ENCOUNTER — Telehealth (INDEPENDENT_AMBULATORY_CARE_PROVIDER_SITE_OTHER): Payer: Self-pay | Admitting: General Surgery

## 2023-08-14 DIAGNOSIS — Z9884 Bariatric surgery status: Secondary | ICD-10-CM

## 2023-08-14 DIAGNOSIS — K639 Disease of intestine, unspecified: Secondary | ICD-10-CM

## 2023-08-14 DIAGNOSIS — R1084 Generalized abdominal pain: Secondary | ICD-10-CM

## 2023-08-14 NOTE — Addendum Note (Signed)
Addended by: Phillips Odor on: 08/14/2023 01:42 PM   Modules accepted: Orders

## 2023-08-14 NOTE — Telephone Encounter (Signed)
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 I have reached out to [facility] and multiple of their surgeons have looked at her case and were actually able to find more of her operative notes with Dr. Artist Pais and her surgery for obstruction at the [facility] requiring two entero-enterostomies on the redo in 09/2018.   She is still sore at times and is able to eat. She does feel like the Carafate has helped some. She feels like she is going up hill now and improving.   She is scheduled for EGD 11/12 and that may show [place] some more information too.   Her legs remain swollen and I think this is from the low protein / albumin. She is now off Eliquis for the PE she had in June.   [facility] feels like she would be better served at [facility]. I discussed with her that now that she is off the Eliquis we can pursue more aggressive management and that [facility] may even want to put her on TPN for her albumin/ nutrition if that remains low before any surgery.   She is understanding. I will see her next week. We will get her referred back to Dr. Artist Pais and send him my H&P, my OR note, my progress notes and telephone notes, the CT scans, UGI via PACS for him to review.   Will get my office to work on this. Will still plan for EGD unless [facility] tells [place] otherwise.  Algis Greenhouse, MD [facility] [address] [place], [place] [address] [phone] (office)

## 2023-08-14 NOTE — Telephone Encounter (Signed)
Referral orders placed

## 2023-08-15 ENCOUNTER — Ambulatory Visit: Payer: Medicare Other | Admitting: General Surgery

## 2023-08-15 NOTE — Telephone Encounter (Signed)
Thank you for the update.  She is scheduled for her enteroscopy 08/28/2023, let me know if we need to make any changes.  Thanks

## 2023-08-16 ENCOUNTER — Other Ambulatory Visit: Payer: Self-pay | Admitting: General Surgery

## 2023-08-16 ENCOUNTER — Other Ambulatory Visit: Payer: Self-pay | Admitting: Nurse Practitioner

## 2023-08-16 DIAGNOSIS — E039 Hypothyroidism, unspecified: Secondary | ICD-10-CM

## 2023-08-16 DIAGNOSIS — I471 Supraventricular tachycardia, unspecified: Secondary | ICD-10-CM

## 2023-08-17 ENCOUNTER — Ambulatory Visit: Payer: Medicare Other | Admitting: Internal Medicine

## 2023-08-17 ENCOUNTER — Encounter: Payer: Self-pay | Admitting: Internal Medicine

## 2023-08-17 VITALS — BP 108/68 | HR 84 | Ht 62.0 in | Wt 145.6 lb

## 2023-08-17 DIAGNOSIS — I77810 Thoracic aortic ectasia: Secondary | ICD-10-CM | POA: Insufficient documentation

## 2023-08-17 DIAGNOSIS — Z79899 Other long term (current) drug therapy: Secondary | ICD-10-CM | POA: Diagnosis not present

## 2023-08-17 DIAGNOSIS — E8809 Other disorders of plasma-protein metabolism, not elsewhere classified: Secondary | ICD-10-CM | POA: Diagnosis not present

## 2023-08-17 DIAGNOSIS — G8929 Other chronic pain: Secondary | ICD-10-CM

## 2023-08-17 DIAGNOSIS — I2693 Single subsegmental pulmonary embolism without acute cor pulmonale: Secondary | ICD-10-CM

## 2023-08-17 DIAGNOSIS — I959 Hypotension, unspecified: Secondary | ICD-10-CM

## 2023-08-17 DIAGNOSIS — F5104 Psychophysiologic insomnia: Secondary | ICD-10-CM

## 2023-08-17 DIAGNOSIS — E039 Hypothyroidism, unspecified: Secondary | ICD-10-CM

## 2023-08-17 DIAGNOSIS — R109 Unspecified abdominal pain: Secondary | ICD-10-CM

## 2023-08-17 DIAGNOSIS — I471 Supraventricular tachycardia, unspecified: Secondary | ICD-10-CM

## 2023-08-17 DIAGNOSIS — G4700 Insomnia, unspecified: Secondary | ICD-10-CM | POA: Insufficient documentation

## 2023-08-17 NOTE — Progress Notes (Signed)
New Patient Office Visit  Subjective    Patient ID: Kayla Dean, female    DOB: 1951-01-23  Age: 72 y.o. MRN: [id number]  CC:  Chief Complaint  Patient presents with   New Patient (Initial Visit)    Establishing care.    HPI Kayla Dean presents to establish care.  She is a 72 year old woman with an extensive and complicated past medical history notable for chronic abdominal pain in the setting of multiple abdominal surgeries, most recently ventral hernia reduction and primary repair in the setting of incarceration (April 2024), PSVT, DVT/PE, bilateral lower extremity edema, chronic hypotension, hypothyroidism, insomnia, and recent treatment for C. difficile colitis.  Previously followed by [facility] and is followed by multiple providers within [facility].  Kayla Dean reports feeling well today.  She endorses chronic abdominal pain and bilateral lower extremity edema but is otherwise asymptomatic and has no acute concerns to discuss aside from desiring to establish care.  Chronic medical conditions and outstanding preventative care items discussed today are individually addressed in A/P below.  Outpatient Encounter Medications as of 08/17/2023  Medication Sig   amoxicillin-clavulanate (AUGMENTIN) 875-125 MG tablet Take 1 tablet by mouth 2 (two) times daily.   ascorbic acid (VITAMIN C) 500 MG tablet Take 500 mg by mouth daily.   BIOTIN 5000 PO Take by mouth.   Calcium Carb-Cholecalciferol (CALCIUM 600 + D) 600-200 MG-UNIT TABS Take 1 tablet by mouth every morning.   Cholecalciferol (VITAMIN D-3 PO) Take 1 capsule by mouth daily.   Elastic Bandages & Supports (ABDOMINAL BINDER/ELASTIC MED) MISC Apply as directed for orthostatic hypotension   fludrocortisone (FLORINEF) 0.1 MG tablet Take 1 tablet (0.1 mg total) by mouth daily.   furosemide (LASIX) 20 MG tablet Take 1 tablet (20 mg total) by mouth daily.   Lactobacillus (PROBIOTIC ACIDOPHILUS) CAPS Take 1 capsule by mouth  2 (two) times daily.   levothyroxine (SYNTHROID, LEVOTHROID) 137 MCG tablet Take 137 mcg by mouth daily before breakfast.   midodrine (PROAMATINE) 10 MG tablet Take 1 tablet (10 mg total) by mouth 3 (three) times daily with meals.   Multiple Vitamin (MULTIVITAMIN WITH MINERALS) TABS tablet Take 1 tablet by mouth every morning.   Nutritional Supplements (,FEEDING SUPPLEMENT, PROSOURCE PLUS) liquid Take 30 mLs by mouth 2 (two) times daily between meals.   ondansetron (ZOFRAN-ODT) 4 MG disintegrating tablet Take 1 tablet (4 mg total) by mouth every 6 (six) hours as needed for nausea.   oxyCODONE (OXY IR/ROXICODONE) 5 MG immediate release tablet Take 1 tablet (5 mg total) by mouth every 4 (four) hours as needed for severe pain or breakthrough pain.   pantoprazole (PROTONIX) 40 MG tablet Take 1 tablet (40 mg total) by mouth 2 (two) times daily.   potassium chloride (KLOR-CON M) 10 MEQ tablet Take 1 tablet (10 mEq total) by mouth daily.   saccharomyces boulardii (FLORASTOR) 250 MG capsule Take 1 capsule (250 mg total) by mouth 2 (two) times daily.   sucralfate (CARAFATE) 1 g tablet Take 1 tablet (1 g total) by mouth 4 (four) times daily -  with meals and at bedtime.   zolpidem (AMBIEN) 5 MG tablet Take 1 tablet (5 mg total) by mouth at bedtime as needed for sleep.   No facility-administered encounter medications on file as of 08/17/2023.    Past Medical History:  Diagnosis Date   Aortic aneurysm (HCC)    Arthritis    H/O gastric bypass    Hiatal hernia  Hypothyroidism    Small bowel obstruction (HCC)     Past Surgical History:  Procedure Laterality Date   ABDOMINAL HYSTERECTOMY     CHOLECYSTECTOMY     COLON SURGERY     HERNIA REPAIR     LAPAROTOMY N/A 02/02/2023   Procedure: EXPLORATORY LAPAROTOMY, [name], EXPLANT OF ABDOMINAL MESH;  Surgeon: Lucretia Roers, MD;  Location: [facility];  Service: General;  Laterality: N/A;   REPLACEMENT TOTAL KNEE Right     ROTATOR CUFF REPAIR Right    TOTAL HIP ARTHROPLASTY Right     Family History  Problem Relation Age of Onset   CAD Father    CAD Brother     Social History   Socioeconomic History   Marital status: Widowed    Spouse name: Not on file   Number of children: Not on file   Years of education: Not on file   Highest education level: Associate degree: occupational, Scientist, product/process development, or vocational program  Occupational History   Not on file  Tobacco Use   Smoking status: Never   Smokeless tobacco: Never  Vaping Use   Vaping status: Never Used  Substance and Sexual Activity   Alcohol use: Never   Drug use: Never   Sexual activity: Not on file  Other Topics Concern   Not on file  Social History Narrative   Not on file   Social Determinants of Health   Financial Resource Strain: Low Risk  (08/10/2023)   Overall Financial Resource Strain (CARDIA)    Difficulty of Paying Living Expenses: Not hard at all  Food Insecurity: No Food Insecurity (08/10/2023)   Hunger Vital Sign    Worried About Running Out of Food in the Last Year: Never true    Ran Out of Food in the Last Year: Never true  Transportation Needs: No Transportation Needs (08/10/2023)   PRAPARE - Administrator, Civil Service (Medical): No    Lack of Transportation (Non-Medical): No  Physical Activity: Unknown (08/10/2023)   Exercise Vital Sign    Days of Exercise per Week: Patient declined    Minutes of Exercise per Session: Not on file  Stress: No Stress Concern Present (08/10/2023)   [employer] of Occupational Health - Occupational Stress Questionnaire    Feeling of Stress : Not at all  Social Connections: Moderately Integrated (08/10/2023)   Social Connection and Isolation Panel [NHANES]    Frequency of Communication with Friends and Family: More than three times a week    Frequency of Social Gatherings with Friends and Family: More than three times a week    Attends Religious Services: More than 4  times per year    Active Member of [employer] or Organizations: Yes    Attends Banker Meetings: More than 4 times per year    Marital Status: Widowed  Intimate Partner Violence: Not At Risk (06/25/2023)   Humiliation, Afraid, Rape, and Kick questionnaire    Fear of Current or Ex-Partner: No    Emotionally Abused: No    Physically Abused: No    Sexually Abused: No   Review of Systems  Constitutional:  Negative for chills and fever.  HENT:  Negative for sore throat.   Respiratory:  Negative for cough and shortness of breath.   Cardiovascular:  Positive for leg swelling (Bilateral lower extremity edema). Negative for chest pain and palpitations.  Gastrointestinal:  Positive for abdominal pain (Chronic, unchanged from baseline). Negative for blood in stool, constipation,  diarrhea, nausea and vomiting.  Genitourinary:  Negative for dysuria and hematuria.  Musculoskeletal:  Negative for myalgias.  Skin:  Negative for itching and rash.  Neurological:  Negative for dizziness and headaches.  Psychiatric/Behavioral:  Negative for depression and suicidal ideas.    Objective    BP 108/68   Pulse 84   Ht 5\' 2"  (1.575 m)   Wt 145 lb 9.6 oz (66 kg)   SpO2 96%   BMI 26.63 kg/m   Physical Exam Vitals reviewed.  Constitutional:      General: She is not in acute distress.    Appearance: Normal appearance. She is not toxic-appearing.  HENT:     Head: Normocephalic and atraumatic.     Right Ear: External ear normal.     Left Ear: External ear normal.     Nose: Nose normal. No congestion or rhinorrhea.     Mouth/Throat:     Mouth: Mucous membranes are moist.     Pharynx: Oropharynx is clear. No oropharyngeal exudate or posterior oropharyngeal erythema.  Eyes:     General: No scleral icterus.    Extraocular Movements: Extraocular movements intact.     Conjunctiva/sclera: Conjunctivae normal.     Pupils: Pupils are equal, round, and reactive to light.  Cardiovascular:     Rate and  Rhythm: Normal rate and regular rhythm.     Pulses: Normal pulses.     Heart sounds: Normal heart sounds. No murmur heard.    No friction rub. No gallop.  Pulmonary:     Effort: Pulmonary effort is normal.     Breath sounds: Normal breath sounds. No wheezing, rhonchi or rales.  Abdominal:     General: Abdomen is flat. Bowel sounds are normal. There is no distension.     Palpations: Abdomen is soft.     Tenderness: There is no abdominal tenderness.  Musculoskeletal:        General: No swelling. Normal range of motion.     Cervical back: Normal range of motion.     Right lower leg: Edema (2+) present.     Left lower leg: Edema (2+) present.  Lymphadenopathy:     Cervical: No cervical adenopathy.  Skin:    General: Skin is warm and dry.     Capillary Refill: Capillary refill takes less than 2 seconds.     Coloration: Skin is not jaundiced.  Neurological:     General: No focal deficit present.     Mental Status: She is alert and oriented to person, place, and time.  Psychiatric:        Mood and Affect: Mood normal.        Behavior: Behavior normal.   Last CBC Lab Results  Component Value Date   WBC 5.5 07/26/2023   HGB 11.4 (L) 07/26/2023   HCT 36.8 07/26/2023   MCV 101.9 (H) 07/26/2023   MCH 31.6 07/26/2023   RDW 15.4 07/26/2023   PLT 312 07/26/2023   Last metabolic panel Lab Results  Component Value Date   GLUCOSE 95 08/02/2023   NA 137 08/02/2023   K 3.6 08/02/2023   CL 102 08/02/2023   CO2 29 08/02/2023   BUN 13 08/02/2023   CREATININE 0.55 08/02/2023   GFRNONAA >60 08/02/2023   CALCIUM 7.3 (L) 08/02/2023   PHOS 3.4 04/13/2023   PROT 4.5 (L) 07/12/2023   ALBUMIN 2.0 (L) 07/12/2023   LABGLOB 2.5 07/12/2023   BILITOT <0.2 07/12/2023   ALKPHOS 71 07/12/2023   AST 19  07/12/2023   ALT 18 07/12/2023   ANIONGAP 6 08/02/2023   Last lipids Lab Results  Component Value Date   CHOL 126 08/13/2018   HDL 37 (L) 08/13/2018   LDLCALC 77 08/13/2018   TRIG 60  08/13/2018   CHOLHDL 3.4 08/13/2018   Last hemoglobin A1c Lab Results  Component Value Date   HGBA1C 4.7 (L) 08/13/2018   Last thyroid functions Lab Results  Component Value Date   TSH 3.284 04/13/2023   Last vitamin B12 and Folate Lab Results  Component Value Date   VITAMINB12 1,051 (H) 07/26/2023   FOLATE 19.8 07/26/2023   Assessment & Plan:   Problem List Items Addressed This Visit       Hypotension    Chronic issue.  BP stable today.  She is prescribed midodrine and Florinef.  No medication changes have been made.      Pulmonary embolism (HCC)    History of DVT/PE earlier this year.  Treated with Eliquis.  Felt to be provoked in the setting of prolonged hospital admission.  Evaluated by hematology last month and Eliquis was discontinued.      PSVT (paroxysmal supraventricular tachycardia) (HCC)    Recently referred to electrophysiology for evaluation after wearing a monitor x 14 days in the setting of heart palpitations, which revealed PSVT.      Ascending aorta dilation (HCC)    Borderline ascending aortic dilation at 39 mm noted incidentally on imaging from February 2024.  Repeat imaging recommended for 1 year.      Acquired hypothyroidism    She is currently prescribed levothyroxine 137 mcg daily.  TSH WNL when updated in June.  No medication changes are indicated today.      Hypoalbuminemia    Underlying etiology of bilateral lower extremity edema/anasarca.  Her latest albumin improved to 2.0 after previously being undetectably low.  She has focused on increasing her daily protein intake.  Discussing TPN with bariatric surgery.  2+ bilateral lower extremity edema is present on exam today.  I recommended continued use of compression stockings and leg elevation as possible.  She is also prescribed Lasix 20 mg daily. -Repeat CMP ordered today for monitoring of electrolytes and albumin level      Chronic abdominal pain    Complicated history of multiple abdominal  surgeries.  She most recently underwent ventral hernia reduction and primary repair in the setting of incarceration.  She has experienced chronic abdominal pain with question of chronic mesenteric ischemia.  Enteritis has also been noted on prior imaging.  She was recently treated for C. difficile colitis with oral vancomycin.  Evaluated by gastroenterology (Dr. Marletta Lor) on 10/10 and will undergo enteroscopy on 11/12. 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  Her case has been reviewed by general surgery (Dr. Henreitta Leber) and bariatric surgery in [place].  Ultimately, she has been referred to bariatrics at [facility] and reports today that she will be contacted next week to schedule an appointment.      Insomnia    She is prescribed Ambien 5 mg nightly as needed      Return in about 3 months (around 11/17/2023).   Billie Lade, MD

## 2023-08-17 NOTE — Assessment & Plan Note (Signed)
Complicated history of multiple abdominal surgeries.  She most recently underwent ventral hernia reduction and primary repair in the setting of incarceration.  She has experienced chronic abdominal pain with question of chronic mesenteric ischemia.  Enteritis has also been noted on prior imaging.  She was recently treated for C. difficile colitis with oral vancomycin.  Evaluated by gastroenterology (Dr. Marletta Lor) on 10/10 and will undergo enteroscopy on 11/12. 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  Her case has been reviewed by general surgery (Dr. Henreitta Leber) and bariatric surgery in [place].  Ultimately, she has been referred to bariatrics at [facility] and reports today that she will be contacted next week to schedule an appointment.

## 2023-08-17 NOTE — Patient Instructions (Signed)
It was a pleasure to see you today.  Thank you for giving [place] the opportunity to be involved in your care.  Below is a brief recap of your visit and next steps.  We will plan to see you again in 3 months.  Summary You have established care today No medication changes were made Repeat labs ordered Follow up in 3 months

## 2023-08-17 NOTE — Assessment & Plan Note (Signed)
History of DVT/PE earlier this year.  Treated with Eliquis.  Felt to be provoked in the setting of prolonged hospital admission.  Evaluated by hematology last month and Eliquis was discontinued.

## 2023-08-17 NOTE — Assessment & Plan Note (Signed)
Chronic issue.  BP stable today.  She is prescribed midodrine and Florinef.  No medication changes have been made.

## 2023-08-17 NOTE — Assessment & Plan Note (Signed)
She is prescribed Ambien 5 mg nightly as needed

## 2023-08-17 NOTE — Assessment & Plan Note (Signed)
She is currently prescribed levothyroxine 137 mcg daily.  TSH WNL when updated in June.  No medication changes are indicated today.

## 2023-08-17 NOTE — Assessment & Plan Note (Signed)
Borderline ascending aortic dilation at 39 mm noted incidentally on imaging from February 2024.  Repeat imaging recommended for 1 year.

## 2023-08-17 NOTE — Assessment & Plan Note (Signed)
Recently referred to electrophysiology for evaluation after wearing a monitor x 14 days in the setting of heart palpitations, which revealed PSVT.

## 2023-08-17 NOTE — Assessment & Plan Note (Signed)
Underlying etiology of bilateral lower extremity edema/anasarca.  Her latest albumin improved to 2.0 after previously being undetectably low.  She has focused on increasing her daily protein intake.  Discussing TPN with bariatric surgery.  2+ bilateral lower extremity edema is present on exam today.  I recommended continued use of compression stockings and leg elevation as possible.  She is also prescribed Lasix 20 mg daily. -Repeat CMP ordered today for monitoring of electrolytes and albumin level

## 2023-08-18 LAB — CMP14+EGFR
ALT: 11 [IU]/L (ref 0–32)
AST: 18 [IU]/L (ref 0–40)
Albumin: 2.5 g/dL — ABNORMAL LOW (ref 3.8–4.8)
Alkaline Phosphatase: 61 [IU]/L (ref 44–121)
BUN/Creatinine Ratio: 19 (ref 12–28)
BUN: 8 mg/dL (ref 8–27)
Bilirubin Total: <0.2 mg/dL (ref 0.0–1.2)
CO2: 27 mmol/L (ref 20–29)
Calcium: 7.7 mg/dL — ABNORMAL LOW (ref 8.7–10.3)
Chloride: 106 mmol/L (ref 96–106)
Creatinine, Ser: 0.42 mg/dL — ABNORMAL LOW (ref 0.57–1.00)
Globulin, Total: 2.6 g/dL (ref 1.5–4.5)
Glucose: 89 mg/dL (ref 70–99)
Potassium: 3.6 mmol/L (ref 3.5–5.2)
Sodium: 143 mmol/L (ref 134–144)
Total Protein: 5.1 g/dL — ABNORMAL LOW (ref 6.0–8.5)
eGFR: 103 mL/min/{1.73_m2} (ref >59)

## 2023-08-22 ENCOUNTER — Other Ambulatory Visit: Payer: Self-pay

## 2023-08-22 MED ORDER — LEVOTHYROXINE SODIUM 137 MCG PO TABS: 137.0000 ug | ORAL_TABLET | Freq: Every day | ORAL | 0 refills | Status: DC

## 2023-08-23 ENCOUNTER — Encounter (HOSPITAL_BASED_OUTPATIENT_CLINIC_OR_DEPARTMENT_OTHER): Payer: Medicare Other | Admitting: Internal Medicine

## 2023-08-23 ENCOUNTER — Encounter: Payer: Self-pay | Admitting: General Surgery

## 2023-08-23 ENCOUNTER — Ambulatory Visit: Payer: Medicare Other | Admitting: General Surgery

## 2023-08-23 VITALS — BP 118/75 | HR 90 | Temp 97.7°F | Resp 16 | Ht 62.0 in | Wt 141.0 lb

## 2023-08-23 DIAGNOSIS — K436 Other and unspecified ventral hernia with obstruction, without gangrene: Secondary | ICD-10-CM

## 2023-08-23 DIAGNOSIS — Z9884 Bariatric surgery status: Secondary | ICD-10-CM

## 2023-08-23 DIAGNOSIS — K639 Disease of intestine, unspecified: Secondary | ICD-10-CM

## 2023-08-23 MED ORDER — OXYCODONE HCL 5 MG PO TABS: 5.0000 mg | ORAL_TABLET | ORAL | 0 refills | Status: DC | PRN

## 2023-08-23 NOTE — Patient Instructions (Signed)
Call with issues. Let us know what Dr. Artist Pais says. Will follow up with Dr. Marletta Lor also.

## 2023-08-23 NOTE — Progress Notes (Signed)
[facility] Note   HPI:  72 y.o. Female presents to clinic for follow-up evaluation of her small bowel narrowing noted on UGI. She is now scheduled for [facility] Dr. Artist Pais. She is also scheduled for EGD with Dr. Marletta Lor. She says overall she is feeling better. . Patient reports she is feeling better and stronger. She is drinking more protein, prosource and her albumin was up to <2.5 from < 1.5 which is a real improvement.   Review of Systems:  Pain in her abdomen around the middle to the left side and back  All other review of systems: otherwise negative   Vital Signs:  BP 118/75   Pulse 90   Temp 97.7 F (36.5 C) (Oral)   Resp 16   Ht 5\' 2"  (1.575 m)   Wt 141 lb (64 kg)   SpO2 95%   BMI 25.79 kg/m    Physical Exam:  Physical Exam Vitals reviewed.  Cardiovascular:     Rate and Rhythm: Normal rate.  Pulmonary:     Effort: Pulmonary effort is normal.  Abdominal:     General: There is no distension.     Palpations: Abdomen is soft.     Tenderness: There is abdominal tenderness. There is no guarding or rebound.      Assessment:  72 y.o. 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  Plan:  Sees Dr. Artist Pais 12/5 Call with issues. Let us know what Dr. Artist Pais says. Will follow up with Dr. Marletta Lor also, EGD 11/12  PRN follow up  Roxicodone refilled for now  Lucianne Lei may have to put her on TPN for nutrition if she does not continue to get her albumin increased, told her to keep her prosource intake   Algis Greenhouse, MD [facility] [address] [place], [place] [address] [phone] (office)

## 2023-08-24 ENCOUNTER — Ambulatory Visit: Payer: Medicare Other | Attending: Nurse Practitioner | Admitting: Nurse Practitioner

## 2023-08-24 ENCOUNTER — Encounter (HOSPITAL_COMMUNITY)
Admission: RE | Admit: 2023-08-24 | Discharge: 2023-08-24 | Disposition: A | Discharge status: Home / Self Care | Payer: Medicare Other | Source: Ambulatory Visit | Attending: Internal Medicine | Admitting: Internal Medicine

## 2023-08-24 ENCOUNTER — Encounter: Payer: Self-pay | Admitting: Nurse Practitioner

## 2023-08-24 VITALS — BP 118/80 | HR 79 | Ht 62.0 in | Wt 143.8 lb

## 2023-08-24 DIAGNOSIS — Z01810 Encounter for preprocedural cardiovascular examination: Secondary | ICD-10-CM

## 2023-08-24 DIAGNOSIS — I77819 Aortic ectasia, unspecified site: Secondary | ICD-10-CM | POA: Diagnosis not present

## 2023-08-24 DIAGNOSIS — R600 Localized edema: Secondary | ICD-10-CM

## 2023-08-24 DIAGNOSIS — Z86711 Personal history of pulmonary embolism: Secondary | ICD-10-CM | POA: Diagnosis not present

## 2023-08-24 DIAGNOSIS — Z86718 Personal history of other venous thrombosis and embolism: Secondary | ICD-10-CM

## 2023-08-24 DIAGNOSIS — R002 Palpitations: Secondary | ICD-10-CM

## 2023-08-24 DIAGNOSIS — K551 Chronic vascular disorders of intestine: Secondary | ICD-10-CM

## 2023-08-24 DIAGNOSIS — I471 Supraventricular tachycardia, unspecified: Secondary | ICD-10-CM

## 2023-08-24 DIAGNOSIS — I959 Hypotension, unspecified: Secondary | ICD-10-CM | POA: Diagnosis not present

## 2023-08-24 DIAGNOSIS — Z79899 Other long term (current) drug therapy: Secondary | ICD-10-CM

## 2023-08-24 NOTE — Patient Instructions (Addendum)
Medication Instructions:  Your physician has recommended you make the following change in your medication:  Please Increase furosemide (LASIX) to 20 MG tablet Twice daily for the next 7 days, then return to normal dosing  Please Increase potassium chloride (KLOR-CON M) to 20 MEQ daily for the next 7 days then return to normal dosing   Continue all other medications as prescribed   Labwork: In 10 days  Testing/Procedures: None   Follow-Up: Your physician recommends that you schedule a follow-up appointment in: 3 Months   Any Other Special Instructions Will Be Listed Below (If Applicable).  If you need a refill on your cardiac medications before your next appointment, please call your pharmacy.

## 2023-08-24 NOTE — Progress Notes (Unsigned)
Cardiology Office Note:  .   Date:  08/24/2023 ID:  Kayla Dean, DOB 1951/04/17, MRN [id number] PCP: Billie Lade, MD  [facility] Providers Cardiologist:  Nona Dell, MD    History of Present Illness: .   Kayla Dean is a 72 y.o. female with a PMH of chest pain, palpitations, hypothyroidism, aortic dilatation, hiatal hernia, and hx of gastric bypass, history of hypertension, recent acute PE/DVT, chronic mesenteric ischemia, who presents today for follow-up.   Last seen by Dr. Diona Browner on November 15, 2022.  She was referred for evaluation by her PCP after ER visit with chest discomfort.  Was ruled out for ACS at that time.  Chest CTA was negative for PE, incidentally noted moderate size hiatal hernia, mild cardiomegaly, ascending thoracic aortic dimension of 4 cm.  At office visit with Dr. Diona Browner, she endorsed right-sided thoracic discomfort/tightness, denied any specific triggers. CCTA revealed mild nonobstructive CAD with evidence of hiatal hernia, borderline dilatation of ascending aorta 39 mm. Cardiac monitor arranged was overall reassuring.   Underwent exploratory laparotomy and reduction of incarcerated hernia in April 2024.  Hospital admission from end of June 2024 to early July 2024 for hypotension, was transferred to [facility] and eventually admitted to ICU and resuscitated with fluids and then subsequently treated with vasopressors, also treated with antibiotics.  Was started on midodrine and hydrocortisone, blood pressure was stabilized.  Etiology found to be multifactorial.  Suspected volume depletion, ischemic colitis.  Sepsis ruled out.  Hospital course complicated by acute PE and acute right peroneal vein DVT that was felt to be probably aggravated with recent surgery, was started on Eliquis.  05/11/2023 -  BP log that showed consistently low SBP since leaving the hospital with more recently improved BP readings over the last 24 hours with max BP  reading 121/83. Weight is stable.Was feeling better since leaving the hospital, but not as active, admitted to some DOE and leg swelling inhibited her mobility. Denied any chest pain, sustained palpitations, syncope, presyncope, dizziness, orthopnea, PND, significant weight changes, acute bleeding, or claudication. Noted leg swelling had reduced, however still noted pedal/ankle swelling, was currently wearing compression stockings.   After office visit, she noted worsening extremity edema.  Per review of telephone notes, she is also noted low blood pressures while on Midodrine. Consulted Dr. Diona Browner. Recommended abdominal binder and was eventually started on Florinef. Parameters previously given for Lasix.   Mechanical fall on 05/16/2023. Denied LOC. CT of head and neck were negative for acute findings. X-ray of left hand revealed acute fracture of proximal metadiaphysis of left fourth proximal phalanx.   ED visit on 05/23/2023 for leg edema. Along anterior aspect of right lower leg, exam revealed mild area of erythema with clear fluid weeping. Labwork unremarkable, UA revealed UTI. Thought to be beginning of cellulitis. Sent in a Rx for Keflex.   Recent hospital admission due to severe sepsis secondary to severe left upper extremity cellulitis.  Treated with antibiotics.  She was weaned off norepinephrine infusion, was in ICU for part of her care, transfer to stepdown ICU.  She was evaluated by wound care.  07/24/2023 - Today she presents for hospital follow-up with her daughter.  She states she is overall doing well, continues to note leg edema and weeping coming from her left lower extremity.  Leg swelling appears slightly improved per her report.  Says her skin appears darker than before.  She is continuing antibiotic treatment for C. Difficile, being managed by  PCP.  Blood pressures are better than prior office visit.  Now SBP averaging recently in the 110s/ low 100s. Compliant with meds. Patient states  that nursing staff told her during hospital visit that she had 2-3 episodes of A-fib noted on telemetry.  I do not see documentation regarding this. Also tells me she scheduled for endoscopy tomorrow, has not been holding her Eliquis. She will follow-up with hematology next Thursday to see if she can come off Eliquis due to her DVT.  Overall, she is doing well from a cardiac perspective.  Does admit to rare, mild and brief palpitations about 3 times since I last saw her. Denies any recent chest pain, shortness of breath, syncope, presyncope, orthopnea, PND, swelling or significant weight changes, acute bleeding, or claudication.  08/24/2023 -she is doing better since when I last saw her. Weight is stable. No longer on Eliquis as she completed 3 months of anticoagulation, medication d/c by Hem/Onc. Denies any chest pain, shortness of breath, palpitations, syncope, presyncope, orthopnea, PND, swelling or significant weight changes, acute bleeding, or claudication. Scheduled for enteroscopy on 08/28/2023.   Studies Reviewed: Marland Kitchen    EKG: EKG is not ordered today.   Cardiac monitor 07/2023: Predominant rhythm is sinus with heart rate ranging from 58 bpm up to 148 bpm and average heart rate 86 bpm. There were rare PACs including atrial couplets and triplets representing less than 1% total beats. There were rare PVCs including ventricular couplets and triplets representing less than 1% total beats. There were 2 episodes of NSVT, the longest of which was 7 beats. Multiple (39) episodes of PSVT were noted, the longest of which lasted 27 seconds with average heart rate in the 150s.  No atrial fibrillation noted. No pauses or high degree heart block.  Vascular ultrasound lower extremity venous bilateral (04/21/2023): Summary:  RIGHT:  - Findings consistent with acute deep vein thrombosis involving the right  peroneal veins.  - No cystic structure found in the popliteal fossa.    LEFT:  - There is no evidence  of deep vein thrombosis in the lower extremity.    - No cystic structure found in the popliteal fossa.  Echo 03/2023: 1. Left ventricular ejection fraction, by estimation, is 60 to 65%. The  left ventricle has normal function. The left ventricle has no regional  wall motion abnormalities. Left ventricular diastolic parameters were  normal.   2. Right ventricular systolic function is normal. The right ventricular  size is normal.   3. Left atrial size was mildly dilated.   4. The mitral valve is abnormal. Trivial mitral valve regurgitation. No  evidence of mitral stenosis.   5. The aortic valve is tricuspid. There is moderate calcification of the  aortic valve. There is moderate thickening of the aortic valve. Aortic  valve regurgitation is mild. Aortic valve sclerosis is present, with no  evidence of aortic valve stenosis.   6. The inferior vena cava is normal in size with greater than 50%  respiratory variability, suggesting right atrial pressure of 3 mmHg.  Cardiac monitor 11/2022: Predominant rhythm is sinus with heart rate ranging from 53 bpm up to 145 bpm and average heart rate 77 bpm. There were rare PACs including atrial couplets and triplets representing less than 1% total beats. There were rare PVCs including ventricular couplets representing less than 1% total beats.  Also limited episodes of ventricular bigeminy and trigeminy. There were two brief episodes of SVT, the longest of which lasted for 7 beats.  Neither of these were patient triggered events. No sustained arrhythmias or pauses.  CCTA 11/2022: IMPRESSION: 1. Mild nonobstructive CAD, CADRADS = 2.   2. Coronary calcium score of 122. This was 71st percentile for age and sex matched control.   3. Normal coronary origin with right dominance.   4.  Hiatal hernia present.   5.  Borderline dilation of ascending aorta at 39 mm.  Lexiscan 07/2018: There was no ST segment deviation noted during stress. The study is  normal. There are no perfusion defects consistent with prior infarct or ischemia. The left ventricular ejection fraction is hyperdynamic (>65%). This is a low risk study.  Physical Exam:   VS:  BP 118/80   Pulse 79   Ht 5\' 2"  (1.575 m)   Wt 143 lb 12.8 oz (65.2 kg)   SpO2 98%   BMI 26.30 kg/m    Wt Readings from Last 3 Encounters:  08/24/23 143 lb 12.8 oz (65.2 kg)  08/23/23 141 lb (64 kg)  08/17/23 145 lb 9.6 oz (66 kg)    GEN: Well nourished, well developed in no acute distress NECK: No JVD; No carotid bruits CARDIAC: S1/S2, RRR, no murmurs, rubs, gallops RESPIRATORY:  Clear to auscultation without rales, wheezing or rhonchi  ABDOMEN: Soft, non-tender, non-distended EXTREMITIES:  nonpitting edema to BLE; No deformity   ASSESSMENT AND PLAN: .    Hypotension Recent BP improvement with SBP. Continue midodrine and florinef.  Discussed to monitor BP at home at least 2 hours after medications and sitting for 5-10 minutes. Given BP log today. Care and ED precautions discussed. Continue to follow with PCP.  Hx of PE/DVT  CT of abdomen 04/2023 revealed PE of right interlobar pulmonary artery and proximal and subsegmental pulmonary arteries. Etiology felt to be aggravated by recent surgery and acute DVT noted along right peroneal veins. Completed Eliquis and medication d/c by Hematology/Oncology. Continue to follow with PCP.  Aortic dilatation CCTA 11/2022 showed borderline dilatation of ascending aorta at 39 mm. Recommend repeating imaging in 1 year for monitoring. Care and ED precautions discussed. Continue to follow with PCP.  Chronic mesenteric ischemia Denies any recent symptoms. Currently being followed by general surgery. Continue to follow-up with Dr. Henreitta Leber as scheduled.   Leg swelling, medication management Improvement per her report. Echo 03/2023 showed EF 60-65%. Leg edema secondary d/t low albumin from GI surgery. Will increase Lasix to 20 mg BID x 1 week and double  potassium supplement and obtain BMET and Mag level in 10 days. Previously placed referral to wound care clinic outpatient.  Salty six diet sheet given, leg elevation discussed. Will continue to monitor. Heart healthy diet encouraged. Continue to follow with PCP.   6. Palpitations, PSVT Does admit to rare, brief palpitations since last visit. Monitor revealed multiple episodes of PSVT - see report noted above. Has upcoming appt with EP. No medication changes at this time. Care and ED precautions discussed.  7. Pre-operative cardiovascular risk assessment Kayla Dean's perioperative risk of a major cardiac event is 0.4% according to the Revised Cardiac Risk Index (RCRI).  Therefore, she is at low risk for perioperative complications.   Her functional capacity is fair at 4.4 METs according to the Duke Activity Status Index (DASI). Recommendations: According to ACC/AHA guidelines, no further cardiovascular testing needed.  The patient may proceed to surgery at acceptable risk.   Antiplatelet and/or Anticoagulation Recommendations: She is not on antiplatelet or anticoagulation therapy/medication that needs to be held prior to procedure. Will route note  to requesting party.   Dispo: Follow-up with me or APP in 3 months or sooner if anything changes.    Signed, Sharlene Dory, NP

## 2023-08-24 NOTE — Pre-Procedure Instructions (Signed)
Attempted pre-op phonecall. Left VM for her to call us back. 

## 2023-08-27 ENCOUNTER — Encounter (HOSPITAL_COMMUNITY): Payer: Self-pay

## 2023-08-27 ENCOUNTER — Other Ambulatory Visit: Payer: Self-pay

## 2023-08-28 ENCOUNTER — Encounter (HOSPITAL_COMMUNITY)
Admission: RE | Disposition: A | Discharge status: Home / Self Care | Payer: Self-pay | Source: Home / Self Care | Attending: Internal Medicine

## 2023-08-28 ENCOUNTER — Ambulatory Visit (HOSPITAL_COMMUNITY): Payer: Medicare Other | Admitting: Anesthesiology

## 2023-08-28 ENCOUNTER — Encounter (HOSPITAL_COMMUNITY): Payer: Self-pay

## 2023-08-28 ENCOUNTER — Ambulatory Visit (HOSPITAL_COMMUNITY)
Admission: RE | Admit: 2023-08-28 | Discharge: 2023-08-28 | Disposition: A | Discharge status: Home / Self Care | Payer: Medicare Other | Attending: Internal Medicine | Admitting: Internal Medicine

## 2023-08-28 DIAGNOSIS — K219 Gastro-esophageal reflux disease without esophagitis: Secondary | ICD-10-CM | POA: Insufficient documentation

## 2023-08-28 DIAGNOSIS — Z9884 Bariatric surgery status: Secondary | ICD-10-CM | POA: Diagnosis not present

## 2023-08-28 DIAGNOSIS — K529 Noninfective gastroenteritis and colitis, unspecified: Secondary | ICD-10-CM | POA: Insufficient documentation

## 2023-08-28 DIAGNOSIS — K449 Diaphragmatic hernia without obstruction or gangrene: Secondary | ICD-10-CM | POA: Insufficient documentation

## 2023-08-28 DIAGNOSIS — K56600 Partial intestinal obstruction, unspecified as to cause: Secondary | ICD-10-CM | POA: Diagnosis not present

## 2023-08-28 DIAGNOSIS — R933 Abnormal findings on diagnostic imaging of other parts of digestive tract: Secondary | ICD-10-CM

## 2023-08-28 DIAGNOSIS — Z980 Intestinal bypass and anastomosis status: Secondary | ICD-10-CM | POA: Insufficient documentation

## 2023-08-28 DIAGNOSIS — K56699 Other intestinal obstruction unspecified as to partial versus complete obstruction: Secondary | ICD-10-CM | POA: Insufficient documentation

## 2023-08-28 DIAGNOSIS — E039 Hypothyroidism, unspecified: Secondary | ICD-10-CM | POA: Diagnosis not present

## 2023-08-28 HISTORY — PX: ESOPHAGOGASTRODUODENOSCOPY (EGD) WITH PROPOFOL: SHX5813

## 2023-08-28 HISTORY — PX: BIOPSY: SHX5522

## 2023-08-28 HISTORY — PX: ENTEROSCOPY: SHX5533

## 2023-08-28 SURGERY — ENTEROSCOPY
Anesthesia: General

## 2023-08-28 MED ORDER — LACTATED RINGERS IV SOLN: INTRAVENOUS | Status: DC | PRN

## 2023-08-28 MED ORDER — LIDOCAINE HCL (CARDIAC) PF 100 MG/5ML IV SOSY
PREFILLED_SYRINGE | INTRAVENOUS | Status: DC | PRN
  Administered 2023-08-28: 50 mg via INTRAVENOUS

## 2023-08-28 MED ORDER — PROPOFOL 500 MG/50ML IV EMUL
INTRAVENOUS | Status: DC | PRN
  Administered 2023-08-28: 200 ug/kg/min via INTRAVENOUS

## 2023-08-28 MED ORDER — PROPOFOL 10 MG/ML IV BOLUS
INTRAVENOUS | Status: DC | PRN
  Administered 2023-08-28: 50 mg via INTRAVENOUS

## 2023-08-28 NOTE — Anesthesia Preprocedure Evaluation (Addendum)
Anesthesia Evaluation  Patient identified by MRN, date of birth, ID band Patient awake    Reviewed: Allergy & Precautions, H&P , NPO status , Patient's Chart, lab work & pertinent test results, reviewed documented beta blocker date and time   Airway Mallampati: III  TM Distance: >3 FB Neck ROM: Full    Dental  (+) Upper Dentures, Lower Dentures   Pulmonary neg pulmonary ROS   Pulmonary exam normal breath sounds clear to auscultation       Cardiovascular Exercise Tolerance: Good (-) hypertensionPt. on home beta blockers Normal cardiovascular exam Rhythm:Regular Rate:Normal  02-Feb-2023 14:20:37 [facility] ROUTINE RECORD 1951/02/24 (71 yr) Female Caucasian Vent. rate 54 BPM PR interval 169 ms QRS duration 98 ms QT/QTcB 474/450 ms P-R-T axes 59 -1 32 Sinus rhythm No significant change since prior 12/23 Confirmed by Meridee Score [phone]) on 02/02/2023 3:03:03 PM [name], MD 11/24/2022  1:19 PM EST   Results reviewed.  Coronary CTA showed a calcium score of 122, but importantly only mild nonobstructive coronary atherosclerosis.  Ascending aorta borderline dilated at 39 mm and also incidentally noted hiatal hernia.  These results would argue for risk factor modification, no indication to pursue coronary revascularization.  Would suggest statin therapy in the presence of atherosclerosis, consider Crestor 5 mg daily.  This can be started by her PCP with whom she should follow-up at this time.  I do wonder whether her hiatal hernia is leading to any of her symptoms.   Aortic aneurysm- moderate- watching- not repaired   Neuro/Psych  Neuromuscular disease  negative psych ROS   GI/Hepatic Neg liver ROS, hiatal hernia,GERD  Controlled,,Gastric bypass   Endo/Other  Hypothyroidism    Renal/GU negative Renal ROS  negative genitourinary   Musculoskeletal  (+) Arthritis , Osteoarthritis,    Abdominal    Peds negative pediatric ROS (+)  Hematology negative hematology ROS (+)   Anesthesia Other Findings   Reproductive/Obstetrics negative OB ROS                             Anesthesia Physical Anesthesia Plan  ASA: 3  Anesthesia Plan: General   Post-op Pain Management:    Induction: Intravenous  PONV Risk Score and Plan:   Airway Management Planned: Nasal Cannula and Natural Airway  Additional Equipment: None  Intra-op Plan:   Post-operative Plan:   Informed Consent: I have reviewed the patients History and Physical, chart, labs and discussed the procedure including the risks, benefits and alternatives for the proposed anesthesia with the patient or authorized representative who has indicated his/her understanding and acceptance.     Dental advisory given  Plan Discussed with: CRNA  Anesthesia Plan Comments:        Anesthesia Quick Evaluation

## 2023-08-28 NOTE — Transfer of Care (Signed)
Immediate Anesthesia Transfer of Care Note  Patient: Kayla Dean  Procedure(s) Performed: ENTEROSCOPY ESOPHAGOGASTRODUODENOSCOPY (EGD) WITH PROPOFOL BIOPSY  Patient Location: Short Stay  Anesthesia Type:General  Level of Consciousness: awake, alert , oriented, and patient cooperative  Airway & Oxygen Therapy: Patient Spontanous Breathing  Post-op Assessment: Report given to RN and Patient moving all extremities X 4  Post vital signs: Reviewed and stable  Last Vitals:  Vitals Value Taken Time  BP 78/51 08/28/23 0959  Temp 36.5 C 08/28/23 0959  Pulse 81 08/28/23 0959  Resp 14 08/28/23 0959  SpO2 95 % 08/28/23 0959    Last Pain:  Vitals:   08/28/23 0959  TempSrc: Oral  PainSc: 0-No pain         Complications: No notable events documented.

## 2023-08-28 NOTE — Op Note (Signed)
[facility] Patient Name: Kayla Dean Procedure Date: 08/28/2023 9:36 AM MRN: [id number] Date of Birth: 29-May-1951 Attending MD: Hennie Duos. Marletta Lor , [place], [id number] CSN: [id number] Age: 72 Admit Type: Outpatient Procedure:                Small bowel enteroscopy Indications:              Abnormal UGI series (with SBFT) Providers:                Hennie Duos. Marletta Lor, DO, Sheran Fava, Angelica Ran, Elinor Parkinson Referring MD:              Medicines:                See the Anesthesia note for documentation of the                            administered medications Complications:            No immediate complications. Estimated Blood Loss:     Estimated blood loss was minimal. Procedure:                Pre-Anesthesia Assessment:                           - The anesthesia plan was to use monitored                            anesthesia care (MAC).                           After obtaining informed consent, the endoscope was                            passed under direct vision. Throughout the                            procedure, the patient's blood pressure, pulse, and                            oxygen saturations were monitored continuously. The                            [id number]) scope was introduced through                            the mouth and advanced to the efferent jejunal                            loop. The small bowel enteroscopy was accomplished                            without difficulty. The patient tolerated the                            procedure well. Scope In: 9:49:29  AM Scope Out: 9:54:00 AM Total Procedure Duration: 0 hours 4 minutes 31 seconds  Findings:      The examined esophagus was normal.      A small amount of food (residue) was found in the gastric body.      Evidence of a Roux-en-Y gastrojejunostomy was found. This was traversed.       First anastamosis appeared healthy. Food residue found at site of  first       anastamosis. Scope was then progressed to second anastamosis. This was       characterized by inflammation, severe stenosis and ulceration. I was       unable to pass the Ultra Slim colonoscope through stenosis. Food residue       found proximal to stenosis consistent with partial obstruction. Biopsies       were taken with a cold forceps for histology Impression:               - Normal esophagus.                           - A small amount of food (residue) in the stomach.                           - Roux-en-Y gastrojejunostomy.                           - Previous surgical anastomosis, characterized by                            inflammation, ulceration and severe stenosis was                            found in the jejunum. Biopsied. Moderate Sedation:      Per Anesthesia Care Recommendation:           - Patient has a contact number available for                            emergencies. The signs and symptoms of potential                            delayed complications were discussed with the                            patient. Return to normal activities tomorrow.                            Written discharge instructions were provided to the                            patient.                           - Mechanical soft diet.                           - Continue present medications.                           -  Await pathology results.                           - Follow up with Dr. Artist Pais at [facility]. I will forward                            him my findings as well. Procedure Code(s):        --- Professional ---                           [phone], Small intestinal endoscopy, enteroscopy                            beyond second portion of duodenum, not including                            ileum; with biopsy, single or multiple Diagnosis Code(s):        --- Professional ---                           Z98.0, Intestinal bypass and anastomosis status                           R93.3,  Abnormal findings on diagnostic imaging of                            other parts of digestive tract CPT copyright 2022 American Medical Association. All rights reserved. The codes documented in this report are preliminary and upon coder review may  be revised to meet current compliance requirements. Hennie Duos. Marletta Lor, DO Hennie Duos. Marletta Lor, DO 08/28/2023 10:07:27 AM This report has been signed electronically. Number of Addenda: 0

## 2023-08-28 NOTE — Discharge Instructions (Signed)
Enteroscopy Discharge instructions Please read the instructions outlined below and refer to this sheet in the next few weeks. These discharge instructions provide you with general information on caring for yourself after you leave the hospital. Your doctor may also give you specific instructions. While your treatment has been planned according to the most current medical practices available, unavoidable complications occasionally occur. If you have any problems or questions after discharge, please call your doctor. ACTIVITY You may resume your regular activity but move at a slower pace for the next 24 hours.  Take frequent rest periods for the next 24 hours.  Walking will help expel (get rid of) the air and reduce the bloated feeling in your abdomen.  No driving for 24 hours (because of the anesthesia (medicine) used during the test).  You may shower.  Do not sign any important legal documents or operate any machinery for 24 hours (because of the anesthesia used during the test).  NUTRITION Drink plenty of fluids.  You may resume your normal diet.  Begin with a light meal and progress to your normal diet.  Avoid alcoholic beverages for 24 hours or as instructed by your caregiver.  MEDICATIONS You may resume your normal medications unless your caregiver tells you otherwise.  WHAT YOU CAN EXPECT TODAY You may experience abdominal discomfort such as a feeling of fullness or "gas" pains.  FOLLOW-UP Your doctor will discuss the results of your test with you.  SEEK IMMEDIATE MEDICAL ATTENTION IF ANY OF THE FOLLOWING OCCUR: Excessive nausea (feeling sick to your stomach) and/or vomiting.  Severe abdominal pain and distention (swelling).  Trouble swallowing.  Temperature over 101 F (37.8 C).  Rectal bleeding or vomiting of blood.   Your enteroscopy revealed a healthy first anastomosis from previous gastric bypass.  I was able to advance further down your small bowel to the second anastomosis  which was characterized by significant stricture/tightening.  Mucosa in this area also appeared inflamed as well as ulcerated.  I took biopsies of this.  Food noted proximal to this consistent with partial outlet obstruction.  Await pathology results, office will contact you.  I will forward my findings to Dr. Artist Pais.   I hope you have a great rest of your week!  Hennie Duos. Marletta Lor, D.O. Gastroenterology and Hepatology [facility]

## 2023-08-28 NOTE — H&P (Signed)
Primary Care Physician:  Billie Lade, MD Primary Gastroenterologist:  Dr. Marletta Lor  Pre-Procedure History & Physical: HPI:  Kayla Dean is a 72 y.o. female is here for an EGD/Enteroscopy to be performed for enteritis, small bowel stricture, diarrhea  Past Medical History:  Diagnosis Date   Aortic aneurysm (HCC)    Arthritis    H/O gastric bypass    Hiatal hernia    Hypothyroidism    Small bowel obstruction (HCC)     Past Surgical History:  Procedure Laterality Date   ABDOMINAL HYSTERECTOMY     CATARACT EXTRACTION Bilateral    CHOLECYSTECTOMY     COLON SURGERY     GASTRIC BYPASS     HERNIA REPAIR     hiatal hernia   LAPAROTOMY N/A 02/02/2023   Procedure: EXPLORATORY LAPAROTOMY, [name], EXPLANT OF ABDOMINAL MESH;  Surgeon: Lucretia Roers, MD;  Location: [facility];  Service: General;  Laterality: N/A;   REPLACEMENT TOTAL KNEE Right    ROTATOR CUFF REPAIR Right    TOTAL HIP ARTHROPLASTY Right     Prior to Admission medications   Medication Sig Start Date End Date Taking? Authorizing Provider  furosemide (LASIX) 20 MG tablet Take 1 tablet (20 mg total) by mouth daily. 06/15/23 09/13/23 Yes Sharlene Dory, NP  levothyroxine (SYNTHROID) 137 MCG tablet Take 1 tablet (137 mcg total) by mouth daily before breakfast. 08/22/23  Yes Billie Lade, MD  oxyCODONE (OXY IR/ROXICODONE) 5 MG immediate release tablet Take 1 tablet (5 mg total) by mouth every 4 (four) hours as needed for severe pain (pain score 7-10) or breakthrough pain. 08/23/23  Yes Lucretia Roers, MD  pantoprazole (PROTONIX) 40 MG tablet Take 1 tablet (40 mg total) by mouth 2 (two) times daily. 07/19/23  Yes Lucretia Roers, MD  potassium chloride (KLOR-CON M) 10 MEQ tablet Take 1 tablet (10 mEq total) by mouth daily. 07/27/23  Yes Sharlene Dory, NP  saccharomyces boulardii (FLORASTOR) 250 MG capsule Take 1 capsule (250 mg total) by mouth 2 (two) times daily. 06/28/23  Yes Johnson,  Clanford L, MD  sucralfate (CARAFATE) 1 g tablet Take 1 tablet (1 g total) by mouth 4 (four) times daily -  with meals and at bedtime. 07/19/23 10/17/23 Yes Lucretia Roers, MD  zolpidem (AMBIEN) 5 MG tablet Take 1 tablet (5 mg total) by mouth at bedtime as needed for sleep. 02/15/23  Yes Lucretia Roers, MD  ascorbic acid (VITAMIN C) 500 MG tablet Take 500 mg by mouth daily.    [provider]  BIOTIN 5000 PO Take by mouth.    [provider]  Calcium Carb-Cholecalciferol (CALCIUM 600 + D) 600-200 MG-UNIT TABS Take 1 tablet by mouth every morning.    [provider]  Cholecalciferol (VITAMIN D-3 PO) Take 1 capsule by mouth daily.    [provider]  Elastic Bandages & Supports (ABDOMINAL BINDER/ELASTIC MED) MISC Apply as directed for orthostatic hypotension 05/23/23   Sharlene Dory, NP  fludrocortisone (FLORINEF) 0.1 MG tablet Take 1 tablet (0.1 mg total) by mouth daily. 07/26/23   Sharlene Dory, NP  Lactobacillus (PROBIOTIC ACIDOPHILUS) CAPS Take 1 capsule by mouth 2 (two) times daily. 06/28/23   [provider]  midodrine (PROAMATINE) 10 MG tablet Take 1 tablet (10 mg total) by mouth 3 (three) times daily with meals. 07/27/23   [name], MD  Multiple Vitamin (MULTIVITAMIN WITH MINERALS) TABS tablet Take 1 tablet by mouth every morning.  [provider]  ondansetron (ZOFRAN-ODT) 4 MG disintegrating tablet Take 1 tablet (4 mg total) by mouth every 6 (six) hours as needed for nausea. 02/08/23   Lucretia Roers, MD    Allergies as of 07/26/2023   (No Known Allergies)    Family History  Problem Relation Age of Onset   CAD Father    CAD Brother     Social History   Socioeconomic History   Marital status: Widowed    Spouse name: Not on file   Number of children: Not on file   Years of education: Not on file   Highest education level: Associate degree: occupational, Scientist, product/process development, or vocational program  Occupational History    Not on file  Tobacco Use   Smoking status: Never   Smokeless tobacco: Never  Vaping Use   Vaping status: Never Used  Substance and Sexual Activity   Alcohol use: Never   Drug use: Never   Sexual activity: Not on file  Other Topics Concern   Not on file  Social History Narrative   Not on file   Social Determinants of Health   Financial Resource Strain: Low Risk  (08/10/2023)   Overall Financial Resource Strain (CARDIA)    Difficulty of Paying Living Expenses: Not hard at all  Food Insecurity: No Food Insecurity (08/10/2023)   Hunger Vital Sign    Worried About Running Out of Food in the Last Year: Never true    Ran Out of Food in the Last Year: Never true  Transportation Needs: No Transportation Needs (08/10/2023)   PRAPARE - Administrator, Civil Service (Medical): No    Lack of Transportation (Non-Medical): No  Physical Activity: Unknown (08/10/2023)   Exercise Vital Sign    Days of Exercise per Week: Patient declined    Minutes of Exercise per Session: Not on file  Stress: No Stress Concern Present (08/10/2023)   [employer] of Occupational Health - Occupational Stress Questionnaire    Feeling of Stress : Not at all  Social Connections: Moderately Integrated (08/10/2023)   Social Connection and Isolation Panel [NHANES]    Frequency of Communication with Friends and Family: More than three times a week    Frequency of Social Gatherings with Friends and Family: More than three times a week    Attends Religious Services: More than 4 times per year    Active Member of [employer] or Organizations: Yes    Attends Banker Meetings: More than 4 times per year    Marital Status: Widowed  Intimate Partner Violence: Not At Risk (06/25/2023)   Humiliation, Afraid, Rape, and Kick questionnaire    Fear of Current or Ex-Partner: No    Emotionally Abused: No    Physically Abused: No    Sexually Abused: No    Review of Systems: General: Negative for  fever, chills, fatigue, weakness. Eyes: Negative for vision changes.  ENT: Negative for hoarseness, difficulty swallowing , nasal congestion. CV: Negative for chest pain, angina, palpitations, dyspnea on exertion, peripheral edema.  Respiratory: Negative for dyspnea at rest, dyspnea on exertion, cough, sputum, wheezing.  GI: See history of present illness. GU:  Negative for dysuria, hematuria, urinary incontinence, urinary frequency, nocturnal urination.  MS: Negative for joint pain, low back pain.  Derm: Negative for rash or itching.  Neuro: Negative for weakness, abnormal sensation, seizure, frequent headaches, memory loss, confusion.  Psych: Negative for anxiety, depression Endo: Negative for unusual weight change.  Heme: Negative for bruising or  bleeding. Allergy: Negative for rash or hives.  Physical Exam: Vital signs in last 24 hours: Temp:  [98.2 F (36.8 C)] 98.2 F (36.8 C) (11/12 0822) Pulse Rate:  [86] 86 (11/12 0822) BP: (120)/(85) 120/85 (11/12 0822) SpO2:  [99 %] 99 % (11/12 0822) Weight:  [65.2 kg] 65.2 kg (11/12 0822)   General:   Alert,  Well-developed, well-nourished, pleasant and cooperative in NAD Head:  Normocephalic and atraumatic. Eyes:  Sclera clear, no icterus.   Conjunctiva pink. Ears:  Normal auditory acuity. Nose:  No deformity, discharge,  or lesions. Msk:  Symmetrical without gross deformities. Normal posture. Extremities:  Without clubbing or edema. Neurologic:  Alert and  oriented x4;  grossly normal neurologically. Skin:  Intact without significant lesions or rashes. Psych:  Alert and cooperative. Normal mood and affect.   Impression/Plan: AHLIYA MUSKA is here for an EGD/Enteroscopy to be performed for enteritis, small bowel stricture, diarrhea  Risks, benefits, limitations, imponderables and alternatives regarding procedure have been reviewed with the patient. Questions have been answered. All parties agreeable.

## 2023-08-28 NOTE — Anesthesia Postprocedure Evaluation (Signed)
Anesthesia Post Note  Patient: CHEVELLE PRISCO  Procedure(s) Performed: ENTEROSCOPY ESOPHAGOGASTRODUODENOSCOPY (EGD) WITH PROPOFOL BIOPSY  Patient location during evaluation: PACU Anesthesia Type: General Level of consciousness: awake and alert Pain management: pain level controlled Vital Signs Assessment: post-procedure vital signs reviewed and stable Respiratory status: spontaneous breathing, nonlabored ventilation, respiratory function stable and patient connected to nasal cannula oxygen Cardiovascular status: blood pressure returned to baseline and stable Postop Assessment: no apparent nausea or vomiting Anesthetic complications: no   There were no known notable events for this encounter.   Last Vitals:  Vitals:   08/28/23 0959 08/28/23 1008  BP: (!) 78/51 90/66  Pulse: 81   Resp: 14   Temp: 36.5 C   SpO2: 95%     Last Pain:  Vitals:   08/28/23 0959  TempSrc: Oral  PainSc: 0-No pain                 Ainara Eldridge L Amos Gaber

## 2023-08-30 LAB — SURGICAL PATHOLOGY

## 2023-08-31 ENCOUNTER — Other Ambulatory Visit (HOSPITAL_COMMUNITY)
Admission: RE | Admit: 2023-08-31 | Discharge: 2023-08-31 | Disposition: A | Discharge status: Home / Self Care | Payer: Medicare Other | Source: Ambulatory Visit | Attending: Nurse Practitioner | Admitting: Nurse Practitioner

## 2023-08-31 ENCOUNTER — Telehealth: Payer: Self-pay | Admitting: Nurse Practitioner

## 2023-08-31 ENCOUNTER — Telehealth: Payer: Self-pay | Admitting: Cardiology

## 2023-08-31 DIAGNOSIS — R600 Localized edema: Secondary | ICD-10-CM | POA: Insufficient documentation

## 2023-08-31 DIAGNOSIS — E876 Hypokalemia: Secondary | ICD-10-CM

## 2023-08-31 DIAGNOSIS — Z79899 Other long term (current) drug therapy: Secondary | ICD-10-CM | POA: Diagnosis present

## 2023-08-31 LAB — BASIC METABOLIC PANEL
Anion gap: 8 (ref 5–15)
BUN: 14 mg/dL (ref 8–23)
CO2: 30 mmol/L (ref 22–32)
Calcium: 7.8 mg/dL — ABNORMAL LOW (ref 8.9–10.3)
Chloride: 101 mmol/L (ref 98–111)
Creatinine, Ser: 0.45 mg/dL (ref 0.44–1.00)
GFR, Estimated: >60 mL/min (ref >60)
Glucose, Bld: 95 mg/dL (ref 70–99)
Potassium: 2.7 mmol/L — CL (ref 3.5–5.1)
Sodium: 139 mmol/L (ref 135–145)

## 2023-08-31 LAB — MAGNESIUM: Magnesium: 1.9 mg/dL (ref 1.7–2.4)

## 2023-08-31 MED ORDER — POTASSIUM CHLORIDE CRYS ER 10 MEQ PO TBCR: 40.0000 meq | EXTENDED_RELEASE_TABLET | Freq: Three times a day (TID) | ORAL | Status: DC

## 2023-08-31 MED ORDER — POTASSIUM CHLORIDE CRYS ER 20 MEQ PO TBCR: 40.0000 meq | EXTENDED_RELEASE_TABLET | Freq: Three times a day (TID) | ORAL | 0 refills | Status: DC

## 2023-08-31 MED ORDER — FUROSEMIDE 20 MG PO TABS: 20.0000 mg | ORAL_TABLET | Freq: Every day | ORAL | Status: DC

## 2023-08-31 NOTE — Telephone Encounter (Signed)
Reports she will not have enough potassium pills and will need a new prescription.  Advised that a new potassium rx will be sent to her pharmacy.

## 2023-08-31 NOTE — Addendum Note (Signed)
Addended by: Eustace Moore on: 08/31/2023 01:29 PM   Modules accepted: Orders

## 2023-08-31 NOTE — Telephone Encounter (Signed)
Patient informed and verbalized understanding of plan. Lab order placed for [facility].

## 2023-08-31 NOTE — Telephone Encounter (Signed)
Pt is confused about instructions just given and would like a call back

## 2023-08-31 NOTE — Telephone Encounter (Signed)
Spoke with Kayla Dean at [facility] calling to report critical potassium 2.7. Result will be sent to chart. Will forward to ordering provider.

## 2023-08-31 NOTE — Telephone Encounter (Signed)
Calling with critical potassium. Please advise

## 2023-09-03 ENCOUNTER — Telehealth: Payer: Self-pay

## 2023-09-03 DIAGNOSIS — E039 Hypothyroidism, unspecified: Secondary | ICD-10-CM

## 2023-09-03 NOTE — Telephone Encounter (Signed)
Copied from CRM [id number]. Topic: Clinical - Request for Lab/Test Order >> Sep 03, 2023  9:34 AM Theodis Sato wrote: Reason for CRM: PT will be in [facility] tomorrow and would like to come get blood work done for her thyroid.

## 2023-09-03 NOTE — Telephone Encounter (Signed)
Patient advised.

## 2023-09-04 ENCOUNTER — Other Ambulatory Visit (HOSPITAL_COMMUNITY)
Admission: RE | Admit: 2023-09-04 | Discharge: 2023-09-04 | Disposition: A | Discharge status: Home / Self Care | Payer: Medicare Other | Source: Ambulatory Visit | Attending: Nurse Practitioner | Admitting: Nurse Practitioner

## 2023-09-04 DIAGNOSIS — E876 Hypokalemia: Secondary | ICD-10-CM | POA: Diagnosis present

## 2023-09-04 DIAGNOSIS — Z79899 Other long term (current) drug therapy: Secondary | ICD-10-CM | POA: Diagnosis present

## 2023-09-04 LAB — BASIC METABOLIC PANEL
Anion gap: 6 (ref 5–15)
BUN: 12 mg/dL (ref 8–23)
CO2: 27 mmol/L (ref 22–32)
Calcium: 8.2 mg/dL — ABNORMAL LOW (ref 8.9–10.3)
Chloride: 104 mmol/L (ref 98–111)
Creatinine, Ser: 0.55 mg/dL (ref 0.44–1.00)
GFR, Estimated: >60 mL/min (ref >60)
Glucose, Bld: 105 mg/dL — ABNORMAL HIGH (ref 70–99)
Potassium: 4 mmol/L (ref 3.5–5.1)
Sodium: 137 mmol/L (ref 135–145)

## 2023-09-05 ENCOUNTER — Encounter (HOSPITAL_COMMUNITY): Payer: Self-pay | Admitting: Internal Medicine

## 2023-09-05 LAB — TSH+FREE T4
Free T4: 0.97 ng/dL (ref 0.82–1.77)
TSH: 35.1 u[IU]/mL — ABNORMAL HIGH (ref 0.450–4.500)

## 2023-09-10 ENCOUNTER — Telehealth: Payer: Self-pay

## 2023-09-10 NOTE — Telephone Encounter (Signed)
Spoke to [employer]

## 2023-09-10 NOTE — Telephone Encounter (Signed)
Copied from CRM [id number]. Topic: Clinical - Home Health Verbal Orders >> Sep 07, 2023  2:56 PM Deaijah H wrote: Caller/Agency: [facility] home [facility] Callback Number: [phone] Service Requested: FAXED ORDERS Frequency: N/A Any new concerns about the patient? Yes [facility] home health called in due to faxing over orders for Dr. Durwin Nora to sign/dated on 08/07/2023 & 08/09/2024 order number [id number] // callback # [phone]

## 2023-09-17 ENCOUNTER — Other Ambulatory Visit: Payer: Self-pay | Admitting: Nurse Practitioner

## 2023-09-17 MED ORDER — FUROSEMIDE 20 MG PO TABS: 20.0000 mg | ORAL_TABLET | Freq: Every day | ORAL | 0 refills | Status: AC | PRN

## 2023-09-17 MED ORDER — POTASSIUM CHLORIDE CRYS ER 20 MEQ PO TBCR: 40.0000 meq | EXTENDED_RELEASE_TABLET | Freq: Every day | ORAL | 0 refills | Status: DC | PRN

## 2023-09-27 ENCOUNTER — Ambulatory Visit: Payer: Medicare Other | Admitting: Cardiovascular Disease

## 2023-10-03 ENCOUNTER — Other Ambulatory Visit (HOSPITAL_COMMUNITY): Payer: Self-pay | Admitting: Internal Medicine

## 2023-10-03 DIAGNOSIS — Z1231 Encounter for screening mammogram for malignant neoplasm of breast: Secondary | ICD-10-CM

## 2023-10-08 ENCOUNTER — Ambulatory Visit (HOSPITAL_COMMUNITY)
Admission: RE | Admit: 2023-10-08 | Discharge: 2023-10-08 | Disposition: A | Discharge status: Home / Self Care | Payer: Medicare Other | Source: Ambulatory Visit | Attending: Internal Medicine | Admitting: Internal Medicine

## 2023-10-08 ENCOUNTER — Other Ambulatory Visit: Payer: Self-pay | Admitting: General Surgery

## 2023-10-08 DIAGNOSIS — Z1231 Encounter for screening mammogram for malignant neoplasm of breast: Secondary | ICD-10-CM | POA: Insufficient documentation

## 2023-10-09 ENCOUNTER — Other Ambulatory Visit: Payer: Self-pay | Admitting: General Surgery

## 2023-10-09 ENCOUNTER — Other Ambulatory Visit: Payer: Self-pay | Admitting: Cardiology

## 2023-10-11 ENCOUNTER — Other Ambulatory Visit: Payer: Self-pay | Admitting: Nurse Practitioner

## 2023-10-11 MED ORDER — POTASSIUM CHLORIDE CRYS ER 20 MEQ PO TBCR: 40.0000 meq | EXTENDED_RELEASE_TABLET | Freq: Every day | ORAL | 0 refills | Status: DC | PRN

## 2023-10-15 ENCOUNTER — Encounter: Payer: Self-pay | Admitting: Cardiovascular Disease

## 2023-10-15 ENCOUNTER — Other Ambulatory Visit (HOSPITAL_COMMUNITY): Payer: Self-pay | Admitting: Internal Medicine

## 2023-10-15 ENCOUNTER — Ambulatory Visit: Payer: Medicare Other | Attending: Cardiovascular Disease | Admitting: Cardiovascular Disease

## 2023-10-15 VITALS — BP 140/88 | HR 75 | Ht 62.0 in | Wt 131.8 lb

## 2023-10-15 DIAGNOSIS — I4891 Unspecified atrial fibrillation: Secondary | ICD-10-CM

## 2023-10-15 DIAGNOSIS — R002 Palpitations: Secondary | ICD-10-CM | POA: Diagnosis not present

## 2023-10-15 DIAGNOSIS — R928 Other abnormal and inconclusive findings on diagnostic imaging of breast: Secondary | ICD-10-CM

## 2023-10-15 DIAGNOSIS — I471 Supraventricular tachycardia, unspecified: Secondary | ICD-10-CM | POA: Diagnosis not present

## 2023-10-15 NOTE — Patient Instructions (Signed)

## 2023-10-15 NOTE — Progress Notes (Signed)
Electrophysiology Office Note:    Date:  10/15/2023   ID:  Kayla Dean, DOB [birth year], MRN [id number]  PCP:  Billie Lade, MD   [facility] Providers Cardiologist:  Nona Dell, MD     Referring MD: Sharlene Dory, NP   History of Present Illness:    Kayla Dean is a 72 y.o. female with a medical history significant for intestinal hernias, PE/DVT referred for evaluation and management of palpitations and SVT.     I discussed the use of AI scribe software for clinical note transcription with the patient, who gave verbal consent to proceed.  The patient, with a history of multiple ICU admissions due to cellulitis and low blood pressure, presents with rapid heart rates that she has been experiencing for the past two or three months. She first noticed these symptoms after her hospital stays when she was told by some nurses that her monitor showed AF. No strips documenting AF were saved to her chart. She describes the sensation as a sudden racing of her heart that subsides after a few seconds. The patient also reports severe pain related to her intestinal issues, which sometimes distracts her from the heart symptoms. 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 The patient is currently on midodrine and fludrocortisone for her low blood pressure, which she feels has helped. However, she expresses a desire to reduce her medication intake.     Today, she reports that she feels well.  She occasionally has palpitations these rarely last anything longer than a few seconds.  EKGs/Labs/Other Studies Reviewed Today:     Echocardiogram:  TTE 03/24/2023 LV EF 60-65%. Mildly dilated LA   Monitors:  Zio monitor 14 days  08/07/2023 -- my interpretation Sinus rhythm heart rate 58 to 140 bpm, average 86 bpm There were multiple episodes of SVT, the longest of which was less than half a minute.  Rate 150 bpm    EKG:   EKG  Interpretation Date/Time:  Monday October 15 2023 16:00:29 EST Ventricular Rate:  75 PR Interval:  168 QRS Duration:  76 QT Interval:  390 QTC Calculation: 435 R Axis:   -51  Text Interpretation: Normal sinus rhythm Left anterior fascicular block When compared with ECG of 24-Jul-2023 09:44, QRS voltage has increased Confirmed by York Pellant [phone]) on 10/15/2023 4:35:54 PM     Physical Exam:    VS:  BP (!) 140/88 (BP Location: Left Arm, Patient Position: Sitting, Cuff Size: Normal)   Pulse 75   Ht 5\' 2"  (1.575 m)   Wt 131 lb 12.8 oz (59.8 kg)   SpO2 96%   BMI 24.11 kg/m     Wt Readings from Last 3 Encounters:  10/15/23 131 lb 12.8 oz (59.8 kg)  08/28/23 143 lb 11.8 oz (65.2 kg)  08/24/23 143 lb 12.8 oz (65.2 kg)     GEN: Well nourished, well developed in no acute distress CARDIAC: RRR, no murmurs, rubs, gallops RESPIRATORY:  Normal work of breathing MUSCULOSKELETAL: no edema    ASSESSMENT & PLAN:     SVT Detected on 14-day Zio patch after AF was reportedly seen during a hospitalization Longest episode was less than 30 seconds Pt has experienced brief palpitations lasting only a few seconds Due to hypotension, low arrhythmia burden, minimal symptoms, would avoid betablockers and calcium channel blockers I don't think EP study/ablation are warranted at this time She will continue to monitor and call us if she has recurrence  of symptoms lasting more than a few minutes Her arrhythmia may resolve as her GI issues improve     Signed, Maurice Small, MD  10/15/2023 4:53 PM    [facility]

## 2023-10-18 ENCOUNTER — Other Ambulatory Visit: Payer: Self-pay

## 2023-10-18 DIAGNOSIS — K529 Noninfective gastroenteritis and colitis, unspecified: Secondary | ICD-10-CM

## 2023-10-18 MED ORDER — PANTOPRAZOLE SODIUM 40 MG PO TBEC: 40.0000 mg | DELAYED_RELEASE_TABLET | Freq: Two times a day (BID) | ORAL | 2 refills | Status: DC

## 2023-10-24 ENCOUNTER — Ambulatory Visit (INDEPENDENT_AMBULATORY_CARE_PROVIDER_SITE_OTHER): Payer: Medicare Other

## 2023-10-24 VITALS — BP 125/82 | HR 79 | Ht 62.0 in | Wt 126.1 lb

## 2023-10-24 DIAGNOSIS — Z780 Asymptomatic menopausal state: Secondary | ICD-10-CM | POA: Diagnosis not present

## 2023-10-24 DIAGNOSIS — Z1159 Encounter for screening for other viral diseases: Secondary | ICD-10-CM

## 2023-10-24 DIAGNOSIS — Z0000 Encounter for general adult medical examination without abnormal findings: Secondary | ICD-10-CM | POA: Diagnosis not present

## 2023-10-24 NOTE — Progress Notes (Signed)
 Subjective:   Kayla Dean is a 73 y.o. female who presents for Medicare Annual (Subsequent) preventive examination.  Visit Complete: In person  Patient Medicare AWV questionnaire was completed by the patient on 10/20/2023; I have confirmed that all information answered by patient is correct and no changes since this date.  Cardiac Risk Factors include: advanced age (>58men, >71 women);sedentary lifestyle     Objective:    Today's Vitals   10/20/23 0949 10/24/23 1503  BP:  125/82  Pulse:  79  SpO2:  94%  Weight:  126 lb 1.9 oz (57.2 kg)  Height:  5' 2 (1.575 m)  PainSc: 9     Body mass index is 23.07 kg/m.     10/24/2023    3:01 PM 08/28/2023    8:18 AM 08/27/2023    9:44 AM 08/02/2023    9:42 AM 06/25/2023    4:39 PM 06/25/2023    9:47 AM 05/28/2023    2:16 PM  Advanced Directives  Does Patient Have a Medical Advance Directive? No No No Yes  No Yes  Type of Theme [employer] Manager;Living will   Healthcare Power of [place];Living will  Does patient want to make changes to medical advance directive?    No - Patient declined   No - Patient declined  Copy of Healthcare Power of Attorney in Chart?    No - copy requested   No - copy requested  Would patient like information on creating a medical advance directive? No - Patient declined  No - Patient declined No - Patient declined No - Patient declined  No - Patient declined    Current Medications (verified) Outpatient Encounter Medications as of 10/24/2023  Medication Sig   ascorbic acid (VITAMIN C) 500 MG tablet Take 500 mg by mouth daily.   BIOTIN 5000 PO Take by mouth.   Calcium  Carb-Cholecalciferol (CALCIUM  600 + D) 600-200 MG-UNIT TABS Take 1 tablet by mouth every morning.   Cholecalciferol (VITAMIN D -3 PO) Take 1 capsule by mouth daily.   Elastic Bandages & Supports (ABDOMINAL BINDER/ELASTIC MED) MISC Apply as directed for orthostatic hypotension   fludrocortisone  (FLORINEF ) 0.1 MG tablet Take  1 tablet (0.1 mg total) by mouth daily.   furosemide  (LASIX ) 20 MG tablet Take 1 tablet (20 mg total) by mouth daily as needed for fluid or edema.   Lactobacillus (PROBIOTIC ACIDOPHILUS) CAPS Take 1 capsule by mouth 2 (two) times daily.   levothyroxine  (SYNTHROID ) 137 MCG tablet Take 1 tablet (137 mcg total) by mouth daily before breakfast.   midodrine  (PROAMATINE ) 10 MG tablet TAKE ONE TABLET BY MOUTH THREE TIMES DAILY WITH FOOD   Multiple Vitamin (MULTIVITAMIN WITH MINERALS) TABS tablet Take 1 tablet by mouth every morning.   ondansetron  (ZOFRAN -ODT) 4 MG disintegrating tablet Take 1 tablet (4 mg total) by mouth every 6 (six) hours as needed for nausea.   oxyCODONE  (OXY IR/ROXICODONE ) 5 MG immediate release tablet Take 1 tablet (5 mg total) by mouth every 4 (four) hours as needed for severe pain (pain score 7-10) or breakthrough pain.   pantoprazole  (PROTONIX ) 40 MG tablet Take 1 tablet (40 mg total) by mouth 2 (two) times daily.   potassium chloride  SA (KLOR-CON  M) 20 MEQ tablet Take 2 tablets (40 mEq total) by mouth daily as needed (When taking Lasix ).   saccharomyces boulardii (FLORASTOR) 250 MG capsule Take 1 capsule (250 mg total) by mouth 2 (two) times daily.   zolpidem  (AMBIEN ) 5 MG  tablet Take 1 tablet (5 mg total) by mouth at bedtime as needed for sleep.   sucralfate  (CARAFATE ) 1 g tablet Take 1 tablet (1 g total) by mouth 4 (four) times daily -  with meals and at bedtime.   No facility-administered encounter medications on file as of 10/24/2023.    Allergies (verified) Patient has no known allergies.   History: Past Medical History:  Diagnosis Date   Anemia    Aortic aneurysm (HCC)    Arthritis    GERD (gastroesophageal reflux disease)    H/O gastric bypass    Hiatal hernia    Hypothyroidism    Osteoporosis    Small bowel obstruction [facility])    Past Surgical History:  Procedure Laterality Date   ABDOMINAL HYSTERECTOMY     BIOPSY  08/28/2023   Procedure: BIOPSY;  Surgeon:  Cindie Carlin POUR, DO;  Location: [facility];  Service: Endoscopy;;   CATARACT EXTRACTION Bilateral    CHOLECYSTECTOMY     COLON SURGERY     ENTEROSCOPY N/A 08/28/2023   Procedure: ENTEROSCOPY;  Surgeon: Cindie Carlin POUR, DO;  Location: [facility];  Service: Endoscopy;  Laterality: N/A;  1015am, asa 3   ESOPHAGOGASTRODUODENOSCOPY (EGD) WITH PROPOFOL  N/A 08/28/2023   Procedure: ESOPHAGOGASTRODUODENOSCOPY (EGD) WITH PROPOFOL ;  Surgeon: Cindie Carlin POUR, DO;  Location: [facility];  Service: Endoscopy;  Laterality: N/A;   EYE SURGERY     Cataract   FRACTURE SURGERY     GASTRIC BYPASS     HERNIA REPAIR     hiatal hernia   JOINT REPLACEMENT     LAPAROTOMY N/A 02/02/2023   Procedure: EXPLORATORY LAPAROTOMY, [name], EXPLANT OF ABDOMINAL MESH;  Surgeon: Kallie Manuelita BROCKS, MD;  Location: [facility];  Service: General;  Laterality: N/A;   REPLACEMENT TOTAL KNEE Right    ROTATOR CUFF REPAIR Right    SMALL INTESTINE SURGERY  02/02/2023   TOTAL HIP ARTHROPLASTY Right    Family History  Problem Relation Age of Onset   CAD Father    Heart disease Father    CAD Brother    Heart disease Brother    Social History   Socioeconomic History   Marital status: Widowed    Spouse name: Not on file   Number of children: Not on file   Years of education: Not on file   Highest education level: Associate degree: occupational, scientist, product/process development, or vocational program  Occupational History   Not on file  Tobacco Use   Smoking status: Never   Smokeless tobacco: Never  Vaping Use   Vaping status: Never Used  Substance and Sexual Activity   Alcohol use: Never   Drug use: Never   Sexual activity: Not Currently    Birth control/protection: Abstinence, None  Other Topics Concern   Not on file  Social History Narrative   Not on file   Social Drivers of Health   Financial Resource Strain: Low Risk  (10/24/2023)   Overall Financial Resource Strain (CARDIA)    Difficulty of  Paying Living Expenses: Not hard at all  Food Insecurity: No Food Insecurity (10/24/2023)   Hunger Vital Sign    Worried About Running Out of Food in the Last Year: Never true    Ran Out of Food in the Last Year: Never true  Transportation Needs: No Transportation Needs (10/24/2023)   PRAPARE - Administrator, Civil Service (Medical): No    Lack of Transportation (Non-Medical): No  Physical Activity: Patient  Declined (10/24/2023)   Exercise Vital Sign    Days of Exercise per Week: Patient declined    Minutes of Exercise per Session: Patient declined  Stress: No Stress Concern Present (10/24/2023)   [employer] of Occupational Health - Occupational Stress Questionnaire    Feeling of Stress : Not at all  Social Connections: Moderately Integrated (10/24/2023)   Social Connection and Isolation Panel [NHANES]    Frequency of Communication with Friends and Family: More than three times a week    Frequency of Social Gatherings with Friends and Family: More than three times a week    Attends Religious Services: More than 4 times per year    Active Member of [employer] or Organizations: Yes    Attends Banker Meetings: More than 4 times per year    Marital Status: Widowed    Tobacco Counseling Counseling given: Yes   Clinical Intake:  Pre-visit preparation completed: Yes  Pain : 0-10 Pain Score: 9  Pain Type: Chronic pain Pain Location: Abdomen Pain Orientation: Anterior, Lower Pain Descriptors / Indicators: Constant     BMI - recorded: 23.07 Nutritional Risks: None Diabetes: No  How often do you need to have someone help you when you read instructions, pamphlets, or other written materials from your doctor or pharmacy?: 1 - Never  Interpreter Needed?: No  Information entered by :: Rommel Hogston, CMA   Activities of Daily Living    10/20/2023    9:49 AM 08/27/2023    9:46 AM  In your present state of health, do you have any difficulty performing the  following activities:  Hearing? 0   Vision? 0   Difficulty concentrating or making decisions? 0   Walking or climbing stairs? 0   Dressing or bathing? 0   Doing errands, shopping? 0 0  Preparing Food and eating ? N   Using the Toilet? N   In the past six months, have you accidently leaked urine? N   Do you have problems with loss of bowel control? N   Managing your Medications? N   Managing your Finances? N   Housekeeping or managing your Housekeeping? N     Patient Care Team: Melvenia Manus BRAVO, MD as PCP - General (Internal Medicine) Debera Jayson MATSU, MD as PCP - Cardiology (Cardiology) Mealor, Eulas BRAVO, MD as PCP - Electrophysiology (Cardiology)  Indicate any recent Medical Services you may have received from other than [facility] providers in the past year (date may be approximate).     Assessment:   This is a routine wellness examination for [place].  Hearing/Vision screen Hearing Screening - Comments:: Patient denies any hearing difficulties.   Vision Screening - Comments:: Wears rx glasses - up to date with routine eye exams  [facility]   Goals Addressed             This Visit's Progress    Patient Stated       Be pain free in my abdomen area       Depression Screen    10/24/2023    3:22 PM 07/12/2023    3:15 PM 05/16/2017    4:29 PM  PHQ 2/9 Scores  PHQ - 2 Score 0 0 0  PHQ- 9 Score  4     Fall Risk    10/20/2023    9:49 AM 07/12/2023    3:15 PM 05/16/2017    4:29 PM  Fall Risk   Falls in the past year? 1 1 No  Number  falls in past yr: 1 1   Injury with Fall? 1 0   Risk for fall due to : History of fall(s);Impaired balance/gait;Orthopedic patient    Follow up Education provided;Falls prevention discussed;Falls evaluation completed      MEDICARE RISK AT HOME: Medicare Risk at Home Any stairs in or around the home?: (Patient-Rptd) Yes If so, are there any without handrails?: (Patient-Rptd) No Home free of loose throw rugs in walkways, pet beds,  electrical cords, etc?: (Patient-Rptd) Yes Adequate lighting in your home to reduce risk of falls?: (Patient-Rptd) Yes Life alert?: (Patient-Rptd) No Use of a cane, walker or w/c?: (Patient-Rptd) Yes Grab bars in the bathroom?: (Patient-Rptd) Yes Shower chair or bench in shower?: (Patient-Rptd) No Elevated toilet seat or a handicapped toilet?: (Patient-Rptd) No  TIMED UP AND GO:  Was the test performed?  Yes  Length of time to ambulate 10 feet: 5 sec Gait steady and fast without use of assistive device    Cognitive Function:        10/24/2023    3:16 PM  6CIT Screen  What Year? 0 points  What month? 0 points  What time? 0 points  Count back from 20 0 points  Months in reverse 0 points  Repeat phrase 0 points  Total Score 0 points    Immunizations Immunization History  Administered Date(s) Administered   Influenza,inj,quad, With Preservative 08/01/2018   Influenza-Unspecified 07/29/2013, 07/16/2017    TDAP Status: Patient declined vaccine Due, Education has been provided regarding the importance of this vaccine. Advised may receive this vaccine at local pharmacy or Health Dept. Aware to provide a copy of the vaccination record if obtained from local pharmacy or Health Dept. Verbalized acceptance and understanding.   Flu Vaccine: Patient declined flu vaccine Due, Education has been provided regarding the importance of this vaccine. Advised may receive this vaccine at local pharmacy or Health Dept. Aware to provide a copy of the vaccination record if obtained from local pharmacy or Health Dept. Verbalized acceptance and understanding.   Pneumonia Vaccine Status: patient declined pneumonia vaccine Due, Education has been provided regarding the importance of this vaccine. Advised may receive this vaccine at local pharmacy or Health Dept. Aware to provide a copy of the vaccination record if obtained from local pharmacy or Health Dept. Verbalized acceptance and  understanding.   Covid-19 vaccine status: Declined, Education has been provided regarding the importance of this vaccine but patient still declined. Advised may receive this vaccine at local pharmacy or Health Dept.or vaccine clinic. Aware to provide a copy of the vaccination record if obtained from local pharmacy or Health Dept. Verbalized acceptance and understanding.  Qualifies for Shingles Vaccine? Yes   Shingrix Vaccine: Patient declined vaccine.  Due, Education has been provided regarding the importance of this vaccine. Advised may receive this vaccine at local pharmacy or Health Dept. Aware to provide a copy of the vaccination record if obtained from local pharmacy or Health Dept. Verbalized acceptance and understanding.   Screening Tests Health Maintenance  Topic Date Due   COVID-19 Vaccine (1) Never done   Pneumonia Vaccine 5+ Years old (1 of 2 - PCV) Never done   FOOT EXAM  Never done   OPHTHALMOLOGY EXAM  Never done   Diabetic kidney evaluation - Urine ACR  Never done   Hepatitis C Screening  Never done   DTaP/Tdap/Td (1 - Tdap) Never done   Zoster Vaccines- Shingrix (1 of 2) Never done   DEXA SCAN  Never done  HEMOGLOBIN A1C  02/12/2019   INFLUENZA VACCINE  05/17/2023   Diabetic kidney evaluation - eGFR measurement  09/03/2024   Medicare Annual Wellness (AWV)  10/23/2024   MAMMOGRAM  10/07/2025   Colonoscopy  05/26/2032   HPV VACCINES  Aged Out    Health Maintenance  Health Maintenance Due  Topic Date Due   COVID-19 Vaccine (1) Never done   Pneumonia Vaccine 83+ Years old (1 of 2 - PCV) Never done   FOOT EXAM  Never done   OPHTHALMOLOGY EXAM  Never done   Diabetic kidney evaluation - Urine ACR  Never done   Hepatitis C Screening  Never done   DTaP/Tdap/Td (1 - Tdap) Never done   Zoster Vaccines- Shingrix (1 of 2) Never done   DEXA SCAN  Never done   HEMOGLOBIN A1C  02/12/2019   INFLUENZA VACCINE  05/17/2023    Colorectal cancer screening: Type of  screening: Colonoscopy. Completed 05/26/2022. Repeat every 10 years  Mammogram status: Completed 10/08/2023. Repeat every year  Bone Density status: Ordered 10/24/2023. Pt provided with contact info and advised to call to schedule appt.  Lung Cancer Screening: (Low Dose CT Chest recommended if Age 28-80 years, 20 pack-year currently smoking OR have quit w/in 15years.) does not qualify.   Lung Cancer Screening Referral: na  Additional Screening:  Hepatitis C Screening: does qualify; ordered 10/24/2023  Vision Screening: Recommended annual ophthalmology exams for early detection of glaucoma and other disorders of the eye. Is the patient up to date with their annual eye exam?  Yes  Who is the provider or what is the name of the office in which the patient attends annual eye exams? [facility] [place] If pt is not established with a provider, would they like to be referred to a provider to establish care? No .   Dental Screening: Recommended annual dental exams for proper oral hygiene  Diabetic Foot Exam: na// per patient she is not a diabetic  Community Resource Referral / Chronic Care Management: CRR required this visit?  No   CCM required this visit?  No     Plan:     I have personally reviewed and noted the following in the patient's chart:   Medical and social history Use of alcohol, tobacco or illicit drugs  Current medications and supplements including opioid prescriptions. Patient is currently taking opioid prescriptions. Information provided to patient regarding non-opioid alternatives. Patient advised to discuss non-opioid treatment plan with their provider. Functional ability and status Nutritional status Physical activity Advanced directives List of other physicians Hospitalizations, surgeries, and ER visits in previous 12 months Vitals Screenings to include cognitive, depression, and falls Referrals and appointments  In addition, I have reviewed and discussed  with patient certain preventive protocols, quality metrics, and best practice recommendations. A written personalized care plan for preventive services as well as general preventive health recommendations were provided to patient.     [facility] Meleah Demeyer, CMA   10/24/2023   After Visit Summary: (MyChart) Due to this being a telephonic visit, the after visit summary with patients personalized plan was offered to patient via MyChart

## 2023-10-24 NOTE — Patient Instructions (Signed)
 Kayla Dean , Thank you for taking time to come for your Medicare Wellness Visit. I appreciate your ongoing commitment to your health goals. Please review the following plan we discussed and let me know if I can assist you in the future.   Referrals/Orders/Follow-Ups/Clinician Recommendations:  You have an order for:  []   2D Mammogram  []   3D Mammogram  [x]   Bone Density   []   Lung Cancer Screening  Please call for appointment:   [facility] at [facility] [address] [place], [place] [address] [phone]  Make sure to wear two-piece clothing.  No lotions powders or deodorants the day of the appointment Make sure to bring picture ID and insurance card.  Bring list of medications you are currently taking including any supplements.   Schedule your [place] screening mammogram through MyChart!   Log into your MyChart account.  Go to 'Visit' (or 'Appointments' if on mobile App) --> Schedule an Appointment  Under 'Select a Reason for Visit' choose the Mammogram Screening option.  Complete the pre-visit questions and select the time and place that best fits your schedule.  Next Medicare Annual Wellness Visit: October 27, 2024 at 3:50pm telephone visit.   This is a list of the screening recommended for you and due dates:  Health Maintenance  Topic Date Due   DEXA scan (bone density measurement)  Never done   COVID-19 Vaccine (1) Never done   Pneumonia Vaccine (1 of 2 - PCV) Never done   Complete foot exam   Never done   Eye exam for diabetics  Never done   Yearly kidney health urinalysis for diabetes  Never done   Hepatitis C Screening  Never done   DTaP/Tdap/Td vaccine (1 - Tdap) Never done   Zoster (Shingles) Vaccine (1 of 2) Never done   Hemoglobin A1C  02/12/2019   Flu Shot  05/17/2023   Yearly kidney function blood test for diabetes  09/03/2024   Mammogram  10/07/2024   Medicare Annual Wellness Visit  10/23/2024   Colon Cancer Screening   05/26/2032   HPV Vaccine  Aged Out    Advanced directives: (Declined) Advance directive discussed with you today. Even though you declined this today, please call our office should you change your mind, and we can give you the proper paperwork for you to fill out.  Next Medicare Annual Wellness Visit scheduled for next year: Yes  Preventive Care 60 Years and Older, Female Preventive care refers to lifestyle choices and visits with your health care provider that can promote health and wellness. Preventive care visits are also called wellness exams. What can I expect for my preventive care visit? Counseling Your health care provider may ask you questions about your: Medical history, including: Past medical problems. Family medical history. Pregnancy and menstrual history. History of falls. Current health, including: Memory and ability to understand (cognition). Emotional well-being. Home life and relationship well-being. Sexual activity and sexual health. Lifestyle, including: Alcohol, nicotine or tobacco, and drug use. Access to firearms. Diet, exercise, and sleep habits. Work and work astronomer. Sunscreen use. Safety issues such as seatbelt and bike helmet use. Physical exam Your health care provider will check your: Height and weight. These may be used to calculate your BMI (body mass index). BMI is a measurement that tells if you are at a healthy weight. Waist circumference. This measures the distance around your waistline. This measurement also tells if you are at a healthy weight and may help  predict your risk of certain diseases, such as type 2 diabetes and high blood pressure. Heart rate and blood pressure. Body temperature. Skin for abnormal spots. What immunizations do I need?  Vaccines are usually given at various ages, according to a schedule. Your health care provider will recommend vaccines for you based on your age, medical history, and lifestyle or other  factors, such as travel or where you work. What tests do I need? Screening Your health care provider may recommend screening tests for certain conditions. This may include: Lipid and cholesterol levels. Hepatitis C test. Hepatitis B test. HIV (human immunodeficiency virus) test. STI (sexually transmitted infection) testing, if you are at risk. Lung cancer screening. Colorectal cancer screening. Diabetes screening. This is done by checking your blood sugar (glucose) after you have not eaten for a while (fasting). Mammogram. Talk with your health care provider about how often you should have regular mammograms. BRCA-related cancer screening. This may be done if you have a family history of breast, ovarian, tubal, or peritoneal cancers. Bone density scan. This is done to screen for osteoporosis. Talk with your health care provider about your test results, treatment options, and if necessary, the need for more tests. Follow these instructions at home: Eating and drinking  Eat a diet that includes fresh fruits and vegetables, whole grains, lean protein, and low-fat dairy products. Limit your intake of foods with high amounts of sugar, saturated fats, and salt. Take vitamin and mineral supplements as recommended by your health care provider. Do not drink alcohol if your health care provider tells you not to drink. If you drink alcohol: Limit how much you have to 0-1 drink a day. Know how much alcohol is in your drink. In the [place] one drink equals one 12 oz bottle of beer (355 mL), one 5 oz glass of wine (148 mL), or one 1 oz glass of hard liquor (44 mL). Lifestyle Brush your teeth every morning and night with fluoride toothpaste. Floss one time each day. Exercise for at least 30 minutes 5 or more days each week. Do not use any products that contain nicotine or tobacco. These products include cigarettes, chewing tobacco, and vaping devices, such as e-cigarettes. If you need help quitting, ask  your health care provider. Do not use drugs. If you are sexually active, practice safe sex. Use a condom or other form of protection in order to prevent STIs. Take aspirin  only as told by your health care provider. Make sure that you understand how much to take and what form to take. Work with your health care provider to find out whether it is safe and beneficial for you to take aspirin  daily. Ask your health care provider if you need to take a cholesterol-lowering medicine (statin). Find healthy ways to manage stress, such as: Meditation, yoga, or listening to music. Journaling. Talking to a trusted person. Spending time with friends and family. Minimize exposure to UV radiation to reduce your risk of skin cancer. Safety Always wear your seat belt while driving or riding in a vehicle. Do not drive: If you have been drinking alcohol. Do not ride with someone who has been drinking. When you are tired or distracted. While texting. If you have been using any mind-altering substances or drugs. Wear a helmet and other protective equipment during sports activities. If you have firearms in your house, make sure you follow all gun safety procedures. What's next? Visit your health care provider once a year for an annual wellness visit. Ask  your health care provider how often you should have your eyes and teeth checked. Stay up to date on all vaccines. This information is not intended to replace advice given to you by your health care provider. Make sure you discuss any questions you have with your health care provider. Document Revised: 03/30/2021 Document Reviewed: 03/30/2021 Elsevier Patient Education  2024 Elsevier Inc. Managing Pain Without Opioids Opioids are strong medicines used to treat moderate to severe pain. For some people, especially those who have long-term (chronic) pain, opioids may not be the best choice for pain management due to: Side effects like nausea, constipation, and  sleepiness. The risk of addiction (opioid use disorder). The longer you take opioids, the greater your risk of addiction. Pain that lasts for more than 3 months is called chronic pain. Managing chronic pain usually requires more than one approach and is often provided by a team of health care providers working together (multidisciplinary approach). Pain management may be done at a pain management center or pain clinic. How to manage pain without the use of opioids Use non-opioid medicines Non-opioid medicines for pain may include: Over-the-counter or prescription non-steroidal anti-inflammatory drugs (NSAIDs). These may be the first medicines used for pain. They work well for muscle and bone pain, and they reduce swelling. Acetaminophen . This over-the-counter medicine may work well for milder pain but not swelling. Antidepressants. These may be used to treat chronic pain. A certain type of antidepressant (tricyclics) is often used. These medicines are given in lower doses for pain than when used for depression. Anticonvulsants. These are usually used to treat seizures but may also reduce nerve (neuropathic) pain. Muscle relaxants. These relieve pain caused by sudden muscle tightening (spasms). You may also use a pain medicine that is applied to the skin as a patch, cream, or gel (topical analgesic), such as a numbing medicine. These may cause fewer side effects than medicines taken by mouth. Do certain therapies as directed Some therapies can help with pain management. They include: Physical therapy. You will do exercises to gain strength and flexibility. A physical therapist may teach you exercises to move and stretch parts of your body that are weak, stiff, or painful. You can learn these exercises at physical therapy visits and practice them at home. Physical therapy may also involve: Massage. Heat wraps or applying heat or cold to affected areas. Electrical signals that interrupt pain signals  (transcutaneous electrical nerve stimulation, TENS). Weak lasers that reduce pain and swelling (low-level laser therapy). Signals from your body that help you learn to regulate pain (biofeedback). Occupational therapy. This helps you to learn ways to function at home and work with less pain. Recreational therapy. This involves trying new activities or hobbies, such as a physical activity or drawing. Mental health therapy, including: Cognitive behavioral therapy (CBT). This helps you learn coping skills for dealing with pain. Acceptance and commitment therapy (ACT) to change the way you think and react to pain. Relaxation therapies, including muscle relaxation exercises and mindfulness-based stress reduction. Pain management counseling. This may be individual, family, or group counseling.  Receive medical treatments Medical treatments for pain management include: Nerve block injections. These may include a pain blocker and anti-inflammatory medicines. You may have injections: Near the spine to relieve chronic back or neck pain. Into joints to relieve back or joint pain. Into nerve areas that supply a painful area to relieve body pain. Into muscles (trigger point injections) to relieve some painful muscle conditions. A medical device placed near your spine  to help block pain signals and relieve nerve pain or chronic back pain (spinal cord stimulation device). Acupuncture. Follow these instructions at home Medicines Take over-the-counter and prescription medicines only as told by your health care provider. If you are taking pain medicine, ask your health care providers about possible side effects to watch out for. Do not drive or use heavy machinery while taking prescription opioid pain medicine. Lifestyle  Do not use drugs or alcohol to reduce pain. If you drink alcohol, limit how much you have to: 0-1 drink a day for women who are not pregnant. 0-2 drinks a day for men. Know how much  alcohol is in a drink. In the [place] one drink equals one 12 oz bottle of beer (355 mL), one 5 oz glass of wine (148 mL), or one 1 oz glass of hard liquor (44 mL). Do not use any products that contain nicotine or tobacco. These products include cigarettes, chewing tobacco, and vaping devices, such as e-cigarettes. If you need help quitting, ask your health care provider. Eat a healthy diet and maintain a healthy weight. Poor diet and excess weight may make pain worse. Eat foods that are high in fiber. These include fresh fruits and vegetables, whole grains, and beans. Limit foods that are high in fat and processed sugars, such as fried and sweet foods. Exercise regularly. Exercise lowers stress and may help relieve pain. Ask your health care provider what activities and exercises are safe for you. If your health care provider approves, join an exercise class that combines movement and stress reduction. Examples include yoga and tai chi. Get enough sleep. Lack of sleep may make pain worse. Lower stress as much as possible. Practice stress reduction techniques as told by your therapist. General instructions Work with all your pain management providers to find the treatments that work best for you. You are an important member of your pain management team. There are many things you can do to reduce pain on your own. Consider joining an online or in-person support group for people who have chronic pain. Keep all follow-up visits. This is important. Where to find more information You can find more information about managing pain without opioids from: American Academy of Pain Medicine: painmed.org Institute for Chronic Pain: instituteforchronicpain.org American Chronic Pain Association: theacpa.org Contact a health care provider if: You have side effects from pain medicine. Your pain gets worse or does not get better with treatments or home therapy. You are struggling with anxiety or  depression. Summary Many types of pain can be managed without opioids. Chronic pain may respond better to pain management without opioids. Pain is best managed when you and a team of health care providers work together. Pain management without opioids may include non-opioid medicines, medical treatments, physical therapy, mental health therapy, and lifestyle changes. Tell your health care providers if your pain gets worse or is not being managed well enough. This information is not intended to replace advice given to you by your health care provider. Make sure you discuss any questions you have with your health care provider. Document Revised: 01/12/2021 Document Reviewed: 01/12/2021 Elsevier Patient Education  2024 [employer]. Understanding Your Risk for Falls Millions of people have serious injuries from falls each year. It is important to understand your risk of falling. Talk with your health care provider about your risk and what you can do to lower it. If you do have a serious fall, make sure to tell your provider. Falling once raises your risk  of falling again. How can falls affect me? Serious injuries from falls are common. These include: Broken bones, such as hip fractures. Head injuries, such as traumatic brain injuries (TBI) or concussions. A fear of falling can cause you to avoid activities and stay at home. This can make your muscles weaker and raise your risk for a fall. What can increase my risk? There are a number of risk factors that increase your risk for falling. The more risk factors you have, the higher your risk of falling. Serious injuries from a fall happen most often to people who are older than 74 years old. Teenagers and young adults ages 16-29 are also at higher risk. Common risk factors include: Weakness in the lower body. Being generally weak or confused due to long-term (chronic) illness. Dizziness or balance problems. Poor vision. Medicines that cause  dizziness or drowsiness. These may include: Medicines for your blood pressure, heart, anxiety, insomnia, or swelling (edema). Pain medicines. Muscle relaxants. Other risk factors include: Drinking alcohol. Having had a fall in the past. Having foot pain or wearing improper footwear. Working at a dangerous job. Having any of the following in your home: Tripping hazards, such as floor clutter or loose rugs. Poor lighting. Pets. Having dementia or memory loss. What actions can I take to lower my risk of falling?     Physical activity Stay physically fit. Do strength and balance exercises. Consider taking a regular class to build strength and balance. Yoga and tai chi are good options. Vision Have your eyes checked every year and your prescription for glasses or contacts updated as needed. Shoes and walking aids Wear non-skid shoes. Wear shoes that have rubber soles and low heels. Do not wear high heels. Do not walk around the house in socks or slippers. Use a cane or walker as told by your provider. Home safety Attach secure railings on both sides of your stairs. Install grab bars for your bathtub, shower, and toilet. Use a non-skid mat in your bathtub or shower. Attach bath mats securely with double-sided, non-slip rug tape. Use good lighting in all rooms. Keep a flashlight near your bed. Make sure there is a clear path from your bed to the bathroom. Use night-lights. Do not use throw rugs. Make sure all carpeting is taped or tacked down securely. Remove all clutter from walkways and stairways, including extension cords. Repair uneven or broken steps and floors. Avoid walking on icy or slippery surfaces. Walk on the grass instead of on icy or slick sidewalks. Use ice melter to get rid of ice on walkways in the winter. Use a cordless phone. Questions to ask your health care provider Can you help me check my risk for a fall? Do any of my medicines make me more likely to  fall? Should I take a vitamin D  supplement? What exercises can I do to improve my strength and balance? Should I make an appointment to have my vision checked? Do I need a bone density test to check for weak bones (osteoporosis)? Would it help to use a cane or a walker? Where to find more information Centers for Disease Control and Prevention, STEADI: tonerpromos.no Community-Based Fall Prevention Programs: tonerpromos.no [employer] on Aging: baseringtones.pl Contact a health care provider if: You fall at home. You are afraid of falling at home. You feel weak, drowsy, or dizzy. This information is not intended to replace advice given to you by your health care provider. Make sure you discuss any questions you have with  your health care provider. Document Revised: 06/05/2022 Document Reviewed: 06/05/2022 Elsevier Patient Education  2024 Elsevier Inc. Bone Density Test A bone density test uses a type of X-ray to measure the amount of calcium  and other minerals in a person's bones. It can measure bone density in the hip and the spine. The test is similar to having a regular X-ray. This test may also be called: Bone densitometry. Bone mineral density test. Dual-energy X-ray absorptiometry (DEXA). You may have this test to: Diagnose a condition that causes weak or thin bones (osteoporosis). Screen you for osteoporosis. Predict your risk for a broken bone (fracture). Determine how well your osteoporosis treatment is working. Tell a health care provider about: Any allergies you have. All medicines you are taking, including vitamins, herbs, eye drops, creams, and over-the-counter medicines. Any problems you or family members have had with anesthetic medicines. Any blood disorders you have. Any surgeries you have had. Any medical conditions you have. Whether you are pregnant or may be pregnant. Any medical tests you have had within the past 14 days that used contrast material. What are the  risks? Generally, this is a safe test. However, it does expose you to a small amount of radiation, which can slightly increase your cancer risk. What happens before the test? Do not take any calcium  supplements within the 24 hours before your test. You will need to remove all metal jewelry, eyeglasses, removable dental appliances, and any other metal objects on your body. What happens during the test?  You will lie down on an exam table. There will be an X-ray generator below you and an imaging device above you. Other devices, such as boxes or braces, may be used to position your body properly for the scan. The machine will slowly scan your body. You will need to keep very still while the machine does the scan. The images will show up on a screen in the room. Images will be examined by a specialist after your test is finished. The procedure may vary among health care providers and hospitals. What can I expect after the test? It is up to you to get the results of your test. Ask your health care provider, or the department that is doing the test, when your results will be ready. Summary A bone density test is an imaging test that uses a type of X-ray to measure the amount of calcium  and other minerals in your bones. The test may be used to diagnose or screen you for a condition that causes weak or thin bones (osteoporosis), predict your risk for a broken bone (fracture), or determine how well your osteoporosis treatment is working. Do not take any calcium  supplements within 24 hours before your test. Ask your health care provider, or the department that is doing the test, when your results will be ready. This information is not intended to replace advice given to you by your health care provider. Make sure you discuss any questions you have with your health care provider. Document Revised: 06/15/2021 Document Reviewed: 03/18/2020 Elsevier Patient Education  2024 [employer].

## 2023-10-25 ENCOUNTER — Encounter (HOSPITAL_COMMUNITY): Payer: Self-pay

## 2023-10-25 ENCOUNTER — Ambulatory Visit (HOSPITAL_COMMUNITY)
Admission: RE | Admit: 2023-10-25 | Discharge: 2023-10-25 | Disposition: A | Discharge status: Home / Self Care | Payer: Medicare Other | Source: Ambulatory Visit | Attending: Internal Medicine | Admitting: Internal Medicine

## 2023-10-25 DIAGNOSIS — R928 Other abnormal and inconclusive findings on diagnostic imaging of breast: Secondary | ICD-10-CM | POA: Insufficient documentation

## 2023-11-08 ENCOUNTER — Other Ambulatory Visit: Payer: Self-pay | Admitting: Internal Medicine

## 2023-11-08 MED ORDER — SUCRALFATE 1 G PO TABS: 1.0000 g | ORAL_TABLET | Freq: Three times a day (TID) | ORAL | 2 refills | Status: DC

## 2023-11-13 ENCOUNTER — Other Ambulatory Visit: Payer: Self-pay | Admitting: Internal Medicine

## 2023-11-19 ENCOUNTER — Ambulatory Visit: Payer: Medicare Other | Admitting: Internal Medicine

## 2023-11-20 NOTE — Discharge Summary (Signed)
 Discharge Summary   Kayla Dean [id number] 11/21/2023  Admit Date: 11/08/2023 Discharge Date: 11/21/2023 Admitting Physician: Jin Soo Yoo, MD  Discharge Physician: Yoo, Jin Soo, MD  Primary Care Provider: [facility], [place] [facility]  Admission Diagnoses:  Patient Active Problem List  Diagnosis  . Hiatal hernia  . GERD without esophagitis  . Chronic tension-type headache, not intractable  . Vasovagal syncope  . BPV (benign positional vertigo)  . Numbness and tingling  . Bilateral carpal tunnel syndrome  . Paraesophageal hernia  . S/P repair of paraesophageal hernia  . Gastroesophageal reflux disease  . S/P gastric bypass  . S/P bypass gastrojejunostomy  . Abdominal pain, RUQ  . Incisional hernia, without obstruction or gangrene  . Chest pain  . Hypothyroidism    Discharge Diagnoses:  Patient Active Problem List  Diagnosis  . Hiatal hernia  . GERD without esophagitis  . Chronic tension-type headache, not intractable  . Vasovagal syncope  . BPV (benign positional vertigo)  . Numbness and tingling  . Bilateral carpal tunnel syndrome  . Paraesophageal hernia  . S/P repair of paraesophageal hernia  . Gastroesophageal reflux disease  . S/P gastric bypass  . S/P bypass gastrojejunostomy  . Abdominal pain, RUQ  . Incisional hernia, without obstruction or gangrene  . Chest pain  . Hypothyroidism     Consult Orders: IP CONSULT-TPN [facility] IP CASE MANAGEMENT - CONSULT TO CARE COORDINATION SPECIALIST IP CONSULT TO VASCULAR ACCESS TEAM IP CONSULT TO VASCULAR ACCESS TEAM IP CONSULT TO VASCULAR ACCESS TEAM [facility] IP CASE MANAGEMENT - CONSULT TO CARE COORDINATION SPECIALIST    Surgeries Performed: Procedure(s): NONE   Allergies: No Known Allergies   Discharge Exam:  General: alert, cooperative, in no acute distress Lungs: unlabored breathing on RA, speaking in complete sentences Cardiac: regular rate and rhythm Abdomen: soft, non-tender, non-distended  Extremities: no  cyanosis or edema  Brief Hospital Course: Kayla Dean is a 47F with a 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 Since her operation in April, she has suffered from severe daily abdominal pain, nausea, vomiting, and inability to tolerate PO. CT A/P 1/23 shows an abnormal loop of jejunum inferior to the left hepatic lobe with extensive stranding and mesenteric inflammation and multiple lymph nodes; very thickened and edematous when compared to prior CT 07/05/23. No obstruction, free air, or fluid collections. The patient was started on TPN with tentative plans for OR 11/21/23 pending improvement in prealbumin. Her prealbumin increased 10.1 to 13.4, though this is not optimal to help mitigate surgical risks. The patient was ultimately discharged on TPN and OR was postponed until 12/10/23. Nutrition labs were checked and supplemented appropriately. All other laboratory studies remained within normal limits and electrolytes were supplemented as needed. She developed an acute cystitis which was completed with 3 days of Ceftriaxone . The patient was able to void spontaneously. The patient was able to ambulate without difficulty. The patient was discharged on 11/21/2023.    Discharge Disposition:  Home with home health: TPN  Patient Instructions:  Keep your PICC line dry. Do not drive while taking pain medication. Call [phone] [facility] office) for a follow up appointment with your surgeon in 3 weeks if you do not already have one. If we have changed your medications, please see your primary care physician within 10 days of discharge to discuss these medication  changes. Continue your regular diet as tolerated and TPN.   Call [phone] and ask for the BARIATRIC FELLOW ON CALL if you  have: Temperature greater than 100.4 Persistent nausea and vomiting Severe uncontrolled pain Redness, tenderness, or signs of infection (pain, swelling, redness, odor or green/yellow discharge around the site) Difficulty breathing, headache or visual disturbances Hives Persistent dizziness or light-headedness Extreme fatigue Any other questions or concerns you may have after discharge  In an emergency, call 911 or go to [facility] Emergency Department or if you cannot make it to [facility], then a nearby hospital Emergency Department  It is important to bring a complete, current list of your medications to any medical appointments or hospitalizations.  REMINDER:  Carry a list of your medications and allergies with you at all times Call your pharmacy at least 1 week in advance to refill prescriptions    Discharge Medications     New Medications      Details  ferrous sulfate 325 (65 FE) MG EC tablet  325 mg, Oral, Daily Refills: 0   ondansetron  4 MG disintegrating tablet Commonly known as: ZOFRAN -ODT  4 mg, Oral, Every 8 hours PRN Quantity: 20 tablet Refills: 0   scopolamine 1 mg over 3 days patch Commonly known as: TRANSDERM-SCOP Start taking on: November 22, 2023  1 mg, Transdermal, Every 72 hours Quantity: 10 patch Refills: 0       Medications To Continue      Details  ascorbic acid (vitamin C) 500 MG tablet Commonly known as: VITAMIN C  500 mg, Daily Refills: 0   biotin 1 mg tablet  1,000 mcg, 3 times Daily Refills: 0   calcium  carbonate-vitamin D3 600 mg-5 mcg (200 unit) tablet Commonly known as: CALTRATE 600+D  1 tablet, 3 times Daily with meals Refills: 0   calcium  citrate-vitamin D3 315 mg-5 mcg (200 unit) tablet Commonly known as: CITRACAL+D  2 tablets, 2 times Daily with meals Refills: 0   cholecalciferol 1000 unit tablet  Take by mouth 25mg  daily Refills: 0   fludrocortisone  0.1 mg tablet Commonly known as: FLORINEF   0.1 mg,  Daily Refills: 0   gabapentin  300 MG capsule Commonly known as: NEURONTIN   300 mg, Oral, 2 times Daily Quantity: 60 capsule Refills: 11   levothyroxine  137 MCG tablet Commonly known as: SYNTHROID   137 mcg, Daily Refills: 0   midodrine  10 MG tablet Commonly known as: PROAMATINE   10 mg, 3 times Daily Refills: 0   * multivitamin tablet  1 tablet, Daily Refills: 0   * multivitamin tablet  1 tablet, Daily Refills: 0   pantoprazole  40 MG DR tablet Commonly known as: PROTONIX   40 mg, Daily Refills: 0   Saccharomyces boulardii 250 mg capsule Commonly known as: FLORASTOR  250 mg, 2 times Daily Refills: 0   sucralfate  1 gram tablet Commonly known as: CARAFATE   1 g, 4 times Daily Refills: 0      * * There are duplicate medications prescribed to the patient          Stopped Medications    FUROsemide  20 MG tablet Commonly known as: LASIX    hyoscyamine 0.125 mg tablet Commonly known as: LEVSIN   potassium chloride  10 mEq ER tablet Commonly known as: KLOR-CON  M10       Unreviewed Medications      Details  sodium, potassium, and magnesium  oral solution Commonly known as: SUPREP  1 Bottle, Oral, As Directed, One kit contains 2 bottles.  Take both bottles  at the times instructed by your provider. Quantity: 354 mL Refills: 0        Follow-up Recommended:  Please keep your follow up appointments as scheduled below.  Future Appointments  Date Time Provider Department Center  11/26/2023  7:30 AM [facility] PRE-OP HIGH RISK CONSULTS [facility]  12/06/2023  2:20 PM Yoo, Jin Soo, MD [facility]  12/27/2023  1:00 PM Yoo, Jin Soo, MD [facility]      KATHRYN MACARIO POUCH, PA-C   Time spent on this discharge: 35 minutes   *Some images could not be shown.

## 2023-11-26 ENCOUNTER — Ambulatory Visit: Payer: Medicare Other | Admitting: Nurse Practitioner

## 2023-12-05 ENCOUNTER — Inpatient Hospital Stay: Payer: Medicare Other | Admitting: Internal Medicine

## 2023-12-10 DIAGNOSIS — Z86711 Personal history of pulmonary embolism: Secondary | ICD-10-CM

## 2023-12-10 HISTORY — DX: Personal history of pulmonary embolism: Z86.711

## 2024-01-04 ENCOUNTER — Telehealth: Payer: Self-pay | Admitting: Nurse Practitioner

## 2024-01-04 ENCOUNTER — Ambulatory Visit: Payer: Self-pay | Admitting: Internal Medicine

## 2024-01-04 ENCOUNTER — Ambulatory Visit: Payer: Medicare Other | Attending: Nurse Practitioner | Admitting: Nurse Practitioner

## 2024-01-04 ENCOUNTER — Other Ambulatory Visit: Payer: Self-pay | Admitting: Nurse Practitioner

## 2024-01-04 ENCOUNTER — Ambulatory Visit: Attending: Nurse Practitioner

## 2024-01-04 ENCOUNTER — Encounter: Payer: Self-pay | Admitting: Nurse Practitioner

## 2024-01-04 ENCOUNTER — Encounter: Payer: Self-pay | Admitting: Internal Medicine

## 2024-01-04 VITALS — BP 142/90 | HR 67 | Ht 63.0 in | Wt 128.9 lb

## 2024-01-04 VITALS — BP 146/90 | HR 97 | Ht 63.0 in | Wt 129.8 lb

## 2024-01-04 DIAGNOSIS — R0789 Other chest pain: Secondary | ICD-10-CM | POA: Diagnosis not present

## 2024-01-04 DIAGNOSIS — D649 Anemia, unspecified: Secondary | ICD-10-CM | POA: Diagnosis not present

## 2024-01-04 DIAGNOSIS — Z86718 Personal history of other venous thrombosis and embolism: Secondary | ICD-10-CM

## 2024-01-04 DIAGNOSIS — R5383 Other fatigue: Secondary | ICD-10-CM

## 2024-01-04 DIAGNOSIS — I2510 Atherosclerotic heart disease of native coronary artery without angina pectoris: Secondary | ICD-10-CM | POA: Diagnosis not present

## 2024-01-04 DIAGNOSIS — I471 Supraventricular tachycardia, unspecified: Secondary | ICD-10-CM | POA: Diagnosis not present

## 2024-01-04 DIAGNOSIS — Z86711 Personal history of pulmonary embolism: Secondary | ICD-10-CM

## 2024-01-04 DIAGNOSIS — R002 Palpitations: Secondary | ICD-10-CM

## 2024-01-04 DIAGNOSIS — E039 Hypothyroidism, unspecified: Secondary | ICD-10-CM | POA: Diagnosis not present

## 2024-01-04 DIAGNOSIS — E8809 Other disorders of plasma-protein metabolism, not elsewhere classified: Secondary | ICD-10-CM

## 2024-01-04 DIAGNOSIS — I959 Hypotension, unspecified: Secondary | ICD-10-CM

## 2024-01-04 DIAGNOSIS — Z9884 Bariatric surgery status: Secondary | ICD-10-CM

## 2024-01-04 DIAGNOSIS — M79601 Pain in right arm: Secondary | ICD-10-CM

## 2024-01-04 DIAGNOSIS — M79602 Pain in left arm: Secondary | ICD-10-CM

## 2024-01-04 DIAGNOSIS — I77819 Aortic ectasia, unspecified site: Secondary | ICD-10-CM

## 2024-01-04 DIAGNOSIS — Z8679 Personal history of other diseases of the circulatory system: Secondary | ICD-10-CM

## 2024-01-04 DIAGNOSIS — K551 Chronic vascular disorders of intestine: Secondary | ICD-10-CM

## 2024-01-04 NOTE — Progress Notes (Addendum)
 Cardiology Office Note:  .   Date:  01/04/2024 ID:  Kayla Dean, DOB 30-Nov-1950, MRN [id number] PCP: Kayla Lade, MD  [facility] Providers Cardiologist:  Kayla Dell, MD Electrophysiologist:  Kayla Small, MD    History of Present Illness: .   Kayla Dean is a 73 y.o. female with a PMH of chest pain, palpitations, hypothyroidism, aortic dilatation, hiatal hernia, and hx of gastric bypass, history of hypertension, recent acute PE/DVT, chronic mesenteric ischemia, who presents today for follow-up.   Last seen by Dr. Diona Dean on November 15, 2022.  She was referred for evaluation by her PCP after ER visit with chest discomfort.  Was ruled out for ACS at that time.  Chest CTA was negative for PE, incidentally noted moderate size hiatal hernia, mild cardiomegaly, ascending thoracic aortic dimension of 4 cm.  At office visit with Dr. Diona Dean, she endorsed right-sided thoracic discomfort/tightness, denied any specific triggers. CCTA revealed mild nonobstructive CAD with evidence of hiatal hernia, borderline dilatation of ascending aorta 39 mm. Cardiac monitor arranged was overall reassuring.   Underwent exploratory laparotomy and reduction of incarcerated hernia in April 2024.  Hospital admission from end of June 2024 to early July 2024 for hypotension, was transferred to [facility] and eventually admitted to ICU and resuscitated with fluids and then subsequently treated with vasopressors, also treated with antibiotics.  Was started on midodrine and hydrocortisone, blood pressure was stabilized.  Etiology found to be multifactorial.  Suspected volume depletion, ischemic colitis.  Sepsis ruled out.  Hospital course complicated by acute PE and acute right peroneal vein DVT that was felt to be probably aggravated with recent surgery, was started on Eliquis.  05/11/2023 -  BP log that showed consistently low SBP since leaving the hospital with more recently improved BP  readings over the last 24 hours with max BP reading 121/83. Weight is stable.Was feeling better since leaving the hospital, but not as active, admitted to some DOE and leg swelling inhibited her mobility. Denied any chest pain, sustained palpitations, syncope, presyncope, dizziness, orthopnea, PND, significant weight changes, acute bleeding, or claudication. Noted leg swelling had reduced, however still noted pedal/ankle swelling, was currently wearing compression stockings.   After office visit, she noted worsening extremity edema.  Per review of telephone notes, she is also noted low blood pressures while on Midodrine. Consulted Dr. Diona Dean. Recommended abdominal binder and was eventually started on Florinef. Parameters previously given for Lasix.   Mechanical fall on 05/16/2023. Denied LOC. CT of head and neck were negative for acute findings. X-ray of left hand revealed acute fracture of proximal metadiaphysis of left fourth proximal phalanx.   ED visit on 05/23/2023 for leg edema. Along anterior aspect of right lower leg, exam revealed mild area of erythema with clear fluid weeping. Labwork unremarkable, UA revealed UTI. Thought to be beginning of cellulitis. Sent in a Rx for Keflex.   Recent hospital admission due to severe sepsis secondary to severe left upper extremity cellulitis.  Treated with antibiotics.  She was weaned off norepinephrine infusion, was in ICU for part of her care, transfer to stepdown ICU.  She was evaluated by wound care.  07/24/2023 - Today she presents for hospital follow-up with her daughter.  She states she is overall doing well, continues to note leg edema and weeping coming from her left lower extremity.  Leg swelling appears slightly improved per her report.  Says her skin appears darker than before.  She is continuing antibiotic treatment  for C. Difficile, being managed by PCP.  Blood pressures are better than prior office visit.  Now SBP averaging recently in the 110s/  low 100s. Compliant with meds. Patient states that nursing staff told her during hospital visit that she had 2-3 episodes of A-fib noted on telemetry.  I do not see documentation regarding this. Also tells me she scheduled for endoscopy tomorrow, has not been holding her Eliquis. She will follow-up with hematology next Thursday to see if she can come off Eliquis due to her DVT.  Overall, she is doing well from a cardiac perspective.  Does admit to rare, mild and brief palpitations about 3 times since I last saw her. Denies any recent chest pain, shortness of breath, syncope, presyncope, orthopnea, PND, swelling or significant weight changes, acute bleeding, or claudication.  08/24/2023 -she is doing better since when I last saw her. Weight is stable. No longer on Eliquis as she completed 3 months of anticoagulation, medication d/c by Hem/Onc. Denies any chest pain, shortness of breath, palpitations, syncope, presyncope, orthopnea, PND, swelling or significant weight changes, acute bleeding, or claudication. Scheduled for enteroscopy on 08/28/2023.   CT of abdomen in January 2025 revealed abnormal loop of jejunum inferior to the left hepatic lobe with extensive stranding and mesenteric inflammation and multiple lymph nodes, very thickened and edematous when compared to CT scan in September 2024.  There was no obstruction, fluid collections, or free air.  She was started on TPN with tentative plan for OR in February 2025 pending improvement in her prealbumin.  Prealbumin did improve, though not optimal.  She was discharged on TPN in OR was postponed until 12/10/2023.  Did develop acute cystitis and completed antibiotics.  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  Tolerated procedure well.  Today she presents for follow-up.  She notes fatigue and intermittent chest tightness recently, denies any triggers, says it  feels like her arms/shoulders are tired.  She says the chest tightness occurs at random times and denies any alleviating or aggravating factors.  Only lasts a few seconds in duration.  Denies any radiation.  Does admit to more noticeable palpitations that occur about 3 times per day and has been more frequent recently. Palpitations are not long in duration, HR has not been elevated but feels as though it is elevated. Denies any shortness of breath, syncope, presyncope, dizziness, orthopnea, PND, swelling or significant weight changes, acute bleeding, or claudication.  ROS: Negative. See HPI.   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   Studies Reviewed: Marland Kitchen    EKG: EKG is not ordered today.   Cardiac monitor 07/2023: Predominant rhythm is sinus with heart rate ranging from 58 bpm up to 148 bpm and average heart rate 86 bpm. There were rare PACs including atrial couplets and triplets representing less than 1% total beats. There were rare PVCs including ventricular couplets and triplets representing less than 1% total beats. There were 2 episodes of NSVT, the longest of which was 7 beats. Multiple (39) episodes of PSVT were noted, the longest of which lasted 27 seconds with average heart rate in the 150s.  No atrial fibrillation noted. No pauses or high degree heart block.  Vascular ultrasound lower extremity venous bilateral (04/21/2023): Summary:  RIGHT:  - Findings consistent with acute deep vein thrombosis involving the right  peroneal veins.  - No cystic structure found in the popliteal fossa.  LEFT:  - There is no evidence of deep vein thrombosis in the lower extremity.    - No cystic structure found in the popliteal fossa.  Echo 03/2023: 1. Left ventricular ejection fraction, by estimation, is 60 to 65%. The  left ventricle has normal function. The left ventricle has no regional  wall motion abnormalities. Left ventricular diastolic parameters were  normal.   2. Right ventricular  systolic function is normal. The right ventricular  size is normal.   3. Left atrial size was mildly dilated.   4. The mitral valve is abnormal. Trivial mitral valve regurgitation. No  evidence of mitral stenosis.   5. The aortic valve is tricuspid. There is moderate calcification of the  aortic valve. There is moderate thickening of the aortic valve. Aortic  valve regurgitation is mild. Aortic valve sclerosis is present, with no  evidence of aortic valve stenosis.   6. The inferior vena cava is normal in size with greater than 50%  respiratory variability, suggesting right atrial pressure of 3 mmHg.  Cardiac monitor 11/2022: Predominant rhythm is sinus with heart rate ranging from 53 bpm up to 145 bpm and average heart rate 77 bpm. There were rare PACs including atrial couplets and triplets representing less than 1% total beats. There were rare PVCs including ventricular couplets representing less than 1% total beats.  Also limited episodes of ventricular bigeminy and trigeminy. There were two brief episodes of SVT, the longest of which lasted for 7 beats.  Neither of these were patient triggered events. No sustained arrhythmias or pauses.  CCTA 11/2022: IMPRESSION: 1. Mild nonobstructive CAD, CADRADS = 2.   2. Coronary calcium score of 122. This was 71st percentile for age and sex matched control.   3. Normal coronary origin with right dominance.   4.  Hiatal hernia present.   5.  Borderline dilation of ascending aorta at 39 mm.  Lexiscan 07/2018: There was no ST segment deviation noted during stress. The study is normal. There are no perfusion defects consistent with prior infarct or ischemia. The left ventricular ejection fraction is hyperdynamic (>65%). This is a low risk study.  Physical Exam:   VS:  BP (!) 142/90   Pulse 67   Ht 5\' 3"  (1.6 m)   Wt 128 lb 14.4 oz (58.5 kg)   SpO2 98%   BMI 22.83 kg/m    Wt Readings from Last 3 Encounters:  01/04/24 129 lb 12.8 oz  (58.9 kg)  01/04/24 128 lb 14.4 oz (58.5 kg)  10/24/23 126 lb 1.9 oz (57.2 kg)    GEN: Well nourished, well developed in no acute distress NECK: No JVD; No carotid bruits CARDIAC: S1/S2, RRR, no murmurs, rubs, gallops RESPIRATORY:  Clear to auscultation without rales, wheezing or rhonchi  ABDOMEN: Soft, non-tender, non-distended EXTREMITIES:  no edema to BLE; No deformity   ASSESSMENT AND PLAN: .    Chest tightness, mild CAD Etiology unclear, denies any active chest pain. CCTA last year revealed coronary calcium score of 122, mild/nonobstructive CAD. Etiology sounds atypical. No indication for ischemic evaluation at this time. Pt defers labs to her PCP appt this afternoon. I have contacted Dr. Durwin Nora via secure chat to obtain the following labs: CBC, CMET, lipid panel, and others - see below. Continue current medication regimen. Care and ED precautions discussed.   Palpitations, PSVT Does notice more palpitations recently. HR well controlled on exam. Will arrange monitor for further evaluation. Depending on results, may need to consider re-starting low dose Lopressor  to medication regimen now that BP is better controlled. Heart healthy diet and regular cardiovascular exercise encouraged. Care and ED precautions discussed. Have recommended Dr. Durwin Nora obtains CBC, CMET, thyroid panel, and Magnesium level.   Hx of hypotension BP stable on arrival with more recent BP elevation on recheck during exam. Higher threshold for elevated BP readings d/t her past hx of hypotension. Currently on midodrine and florinef - may need to consider slowly weaning her off if having persistent elevated readings.  She will send me her BP log via MyChart to assess her home readings. Discussed to monitor BP at home at least 2 hours after medications and sitting for 5-10 minutes. Given BP log and salty six sheet today. Care and ED precautions discussed. Continue to follow with PCP.  Hx of PE/DVT  CT of abdomen 04/2023  revealed PE of right interlobar pulmonary artery and proximal and subsegmental pulmonary arteries. Etiology felt to be aggravated by recent surgery and acute DVT noted along right peroneal veins. Completed Eliquis and medication d/c by Hematology/Oncology. Continue to follow with PCP.  Aortic dilatation CCTA 11/2022 showed borderline dilatation of ascending aorta at 39 mm. Recommend repeating imaging in 1 year for monitoring. No addressed, but plan to re-visit at next office visit. Care and ED precautions discussed. Continue to follow with PCP.  Chronic mesenteric ischemia She is status post open resection of distal Roux limb. Anastomosed roux to BP more approximately.  Tolerated procedure well. Currently being followed by general surgery. Continue to follow-up with Care team - have requested labs are drawn with PCP as mentioned above.   7. Anemia, fatigue, arm pain Most recent Hgb level was 8.5. Denies any bleeding issues. Fatigue most likely related to her recent hospital stays.Pain along arms is possibly due to deconditioning, may benefit from PT. Have requested labs are obtained with her PCP this afternoon - secure chat sent to Dr. Durwin Nora. No medication changes at this time. Continue to follow with PCP.  Dispo: Follow-up with me or APP in 6-8 weeks or sooner if anything changes.    Signed, Sharlene Dory, NP

## 2024-01-04 NOTE — Progress Notes (Signed)
 Established Patient Office Visit  Subjective   Patient ID: Kayla Dean, female    DOB: 03-15-1951  Age: 73 y.o. MRN: [id number]  Chief Complaint  Patient presents with   Care Management    Pt. Here for a follow up after surgery   [name] return to care today for routine follow-up.  She was last evaluated by me in 2024 as a new patient presenting to establish care.  No medication changes were made at that time, repeat labs ordered, and 39-month follow-up arranged.  In the interim, she underwent exploratory laparotomy with resection of the distal Roux limb in the setting of chronic bowel obstruction.  Her postoperative course has been uncomplicated.  Seen by general surgery for follow-up 3/13.  She has also been seen by cardiology for follow-up.  [name] reports feeling fairly well today.  She endorses fatigue but is otherwise asymptomatic and has no acute concerns to discuss.  Past Medical History:  Diagnosis Date   Anemia    Aortic aneurysm (HCC)    Arthritis    C. difficile colitis 07/20/2023   GERD (gastroesophageal reflux disease)    H/O gastric bypass    Hiatal hernia    Hypothyroidism    Osteoporosis    Shock liver 02/03/2023   Small bowel obstruction [facility])    Past Surgical History:  Procedure Laterality Date   ABDOMINAL HYSTERECTOMY     BIOPSY  08/28/2023   Procedure: BIOPSY;  Surgeon: Vinetta Greening, DO;  Location: [facility];  Service: Endoscopy;;   CATARACT EXTRACTION Bilateral    CHOLECYSTECTOMY     COLON SURGERY     ENTEROSCOPY N/A 08/28/2023   Procedure: ENTEROSCOPY;  Surgeon: Vinetta Greening, DO;  Location: [facility];  Service: Endoscopy;  Laterality: N/A;  1015am, asa 3   ESOPHAGOGASTRODUODENOSCOPY (EGD) WITH PROPOFOL  N/A 08/28/2023   Procedure: ESOPHAGOGASTRODUODENOSCOPY (EGD) WITH PROPOFOL ;  Surgeon: Vinetta Greening, DO;  Location: [facility];  Service: Endoscopy;  Laterality: N/A;   EYE SURGERY     Cataract   FRACTURE SURGERY      GASTRIC BYPASS     HERNIA REPAIR     hiatal hernia   JOINT REPLACEMENT     LAPAROTOMY N/A 02/02/2023   Procedure: EXPLORATORY LAPAROTOMY, [name], EXPLANT OF ABDOMINAL MESH;  Surgeon: Awilda Bogus, MD;  Location: [facility];  Service: General;  Laterality: N/A;   REPLACEMENT TOTAL KNEE Right    ROTATOR CUFF REPAIR Right    SMALL INTESTINE SURGERY  02/02/2023   TOTAL HIP ARTHROPLASTY Right    Social History   Tobacco Use   Smoking status: Never   Smokeless tobacco: Never  Vaping Use   Vaping status: Never Used  Substance Use Topics   Alcohol use: Never   Drug use: Never   Family History  Problem Relation Age of Onset   CAD Father    Heart disease Father    CAD Brother    Heart disease Brother    No Known Allergies  Review of Systems  Constitutional:  Positive for malaise/fatigue. Negative for chills and fever.  HENT:  Negative for sore throat.   Respiratory:  Negative for cough and shortness of breath.   Cardiovascular:  Negative for chest pain, palpitations and leg swelling.  Gastrointestinal:  Negative for abdominal pain, blood in stool, constipation, diarrhea, nausea and vomiting.  Genitourinary:  Negative for dysuria and hematuria.  Musculoskeletal:  Negative for myalgias.  Skin:  Negative for itching and rash.  Neurological:  Negative for dizziness and headaches.  Psychiatric/Behavioral:  Negative for depression and suicidal ideas.      Objective:     BP (!) 146/90 (BP Location: Right Arm, Patient Position: Sitting, Cuff Size: Normal)   Pulse 97   Ht 5\' 3"  (1.6 m)   Wt 129 lb 12.8 oz (58.9 kg)   SpO2 94%   BMI 22.99 kg/m  BP Readings from Last 3 Encounters:  01/04/24 (!) 146/90  01/04/24 (!) 142/90  10/24/23 125/82   Physical Exam Vitals reviewed.  Constitutional:      General: She is not in acute distress.    Appearance: Normal appearance. She is not toxic-appearing.  HENT:     Head: Normocephalic and atraumatic.      Right Ear: External ear normal.     Left Ear: External ear normal.     Nose: Nose normal. No congestion or rhinorrhea.     Mouth/Throat:     Mouth: Mucous membranes are moist.     Pharynx: Oropharynx is clear. No oropharyngeal exudate or posterior oropharyngeal erythema.  Eyes:     General: No scleral icterus.    Extraocular Movements: Extraocular movements intact.     Conjunctiva/sclera: Conjunctivae normal.     Pupils: Pupils are equal, round, and reactive to light.  Cardiovascular:     Rate and Rhythm: Normal rate and regular rhythm.     Pulses: Normal pulses.     Heart sounds: Normal heart sounds. No murmur heard.    No friction rub. No gallop.  Pulmonary:     Effort: Pulmonary effort is normal.     Breath sounds: Normal breath sounds. No wheezing, rhonchi or rales.  Abdominal:     General: Abdomen is flat. Bowel sounds are normal. There is no distension.     Palpations: Abdomen is soft.     Tenderness: There is no abdominal tenderness.     Comments: Healing midline abdominal surgical incision  Musculoskeletal:        General: No swelling. Normal range of motion.     Cervical back: Normal range of motion.     Right lower leg: No edema.     Left lower leg: No edema.  Lymphadenopathy:     Cervical: No cervical adenopathy.  Skin:    General: Skin is warm and dry.     Capillary Refill: Capillary refill takes less than 2 seconds.     Coloration: Skin is not jaundiced.  Neurological:     General: No focal deficit present.     Mental Status: She is alert and oriented to person, place, and time.  Psychiatric:        Mood and Affect: Mood normal.        Behavior: Behavior normal.   Last CBC Lab Results  Component Value Date   WBC 6.2 01/04/2024   HGB 11.4 01/04/2024   HCT 36.8 01/04/2024   MCV 90 01/04/2024   MCH 27.9 01/04/2024   RDW 14.4 01/04/2024   PLT 186 01/04/2024   Last metabolic panel Lab Results  Component Value Date   GLUCOSE 117 (H) 01/18/2024   NA 145  (H) 01/18/2024   K 4.3 01/18/2024   CL 104 01/18/2024   CO2 26 01/18/2024   BUN 11 01/18/2024   CREATININE 0.56 (L) 01/18/2024   GFRNONAA >60 09/04/2023   CALCIUM  9.4 01/18/2024   PHOS 3.4 04/13/2023   PROT 6.7 01/04/2024   ALBUMIN  4.1 01/04/2024  LABGLOB 2.6 01/04/2024   BILITOT 0.3 01/04/2024   ALKPHOS 83 01/04/2024   AST 16 01/04/2024   ALT 17 01/04/2024   ANIONGAP 6 09/04/2023   Last lipids Lab Results  Component Value Date   CHOL 147 01/04/2024   HDL 56 01/04/2024   LDLCALC 75 01/04/2024   TRIG 85 01/04/2024   CHOLHDL 2.6 01/04/2024   Last hemoglobin A1c Lab Results  Component Value Date   HGBA1C 4.9 01/04/2024   Last thyroid  functions Lab Results  Component Value Date   TSH 1.380 01/04/2024   Last vitamin B12 and Folate Lab Results  Component Value Date   VITAMINB12 959 01/04/2024   FOLATE 17.8 01/04/2024     Assessment & Plan:   Problem List Items Addressed This Visit       Hypotension   Stable, chronic issue.  BP is mildly elevated today.  She remains on midodrine  and Florinef .  Managed per cardiology.  She will follow-up in 6-8 weeks.      PSVT (paroxysmal supraventricular tachycardia) (HCC)   Seen by EP for follow-up in December.  New event monitor is pending as palpitations have increased in frequency.      Acquired hypothyroidism - Primary   She remains on levothyroxine  137 mcg daily.  Repeat thyroid  studies ordered today.      H/O gastric bypass   History of Roux-en-Y gastric bypass.  Recently underwent exploratory laparotomy with resection of distal Roux limb in the setting of chronic bowel obstruction.  Her postoperative course has been uncomplicated.  Seen by general surgery for follow-up 3/13.  Healing midline abdominal incision noted on exam today.  Will update basic labs.      Hypoalbuminemia   Noted on previous labs.  She has been encouraged to increase her daily protein intake.  Will update CMP today.      Normocytic anemia    Noted on previous labs.  She is currently prescribed oral iron supplementation.  Will update CBC and iron studies today.       Return in about 3 months (around 04/05/2024).    Tobi Fortes, MD

## 2024-01-04 NOTE — Patient Instructions (Addendum)
 Medication Instructions:   Continue all current medications.   Labwork:  none  Testing/Procedures:  Your physician has recommended that you wear a 7 day event monitor. Event monitors are medical devices that record the heart's electrical activity. Doctors most often [place] these monitors to diagnose arrhythmias. Arrhythmias are problems with the speed or rhythm of the heartbeat. The monitor is a small, portable device. You can wear one while you do your normal daily activities. This is usually used to diagnose what is causing palpitations/syncope (passing out). Office will contact with results via phone, letter or mychart.     Follow-Up:  6-8 weeks    Any Other Special Instructions Will Be Listed Below (If Applicable).  BP log  [place] [facility]   If you need a refill on your cardiac medications before your next appointment, please call your pharmacy.

## 2024-01-04 NOTE — Patient Instructions (Signed)
 It was a pleasure to see you today.  Thank you for giving [place] the opportunity to be involved in your care.  Below is a brief recap of your visit and next steps.  We will plan to see you again in 3 months.  Summary No medication changes today Repeat labs ordered We will plan for follow up in 3 months

## 2024-01-05 ENCOUNTER — Other Ambulatory Visit: Payer: Self-pay | Admitting: Internal Medicine

## 2024-01-05 ENCOUNTER — Encounter: Payer: Self-pay | Admitting: Internal Medicine

## 2024-01-05 DIAGNOSIS — E876 Hypokalemia: Secondary | ICD-10-CM

## 2024-01-05 LAB — MAGNESIUM: Magnesium: 2.1 mg/dL (ref 1.6–2.3)

## 2024-01-05 LAB — CBC WITH DIFFERENTIAL/PLATELET
Basophils Absolute: 0.1 10*3/uL (ref 0.0–0.2)
Basos: 1 %
EOS (ABSOLUTE): 0.1 10*3/uL (ref 0.0–0.4)
Eos: 2 %
Hematocrit: 36.8 % (ref 34.0–46.6)
Hemoglobin: 11.4 g/dL (ref 11.1–15.9)
Immature Grans (Abs): 0 10*3/uL (ref 0.0–0.1)
Immature Granulocytes: 0 %
Lymphocytes Absolute: 2.8 10*3/uL (ref 0.7–3.1)
Lymphs: 45 %
MCH: 27.9 pg (ref 26.6–33.0)
MCHC: 31 g/dL — ABNORMAL LOW (ref 31.5–35.7)
MCV: 90 fL (ref 79–97)
Monocytes Absolute: 0.7 10*3/uL (ref 0.1–0.9)
Monocytes: 11 %
Neutrophils Absolute: 2.5 10*3/uL (ref 1.4–7.0)
Neutrophils: 41 %
Platelets: 186 10*3/uL (ref 150–450)
RBC: 4.09 x10E6/uL (ref 3.77–5.28)
RDW: 14.4 % (ref 11.7–15.4)
WBC: 6.2 10*3/uL (ref 3.4–10.8)

## 2024-01-05 LAB — LIPID PANEL
Chol/HDL Ratio: 2.6 ratio (ref 0.0–4.4)
Cholesterol, Total: 147 mg/dL (ref 100–199)
HDL: 56 mg/dL (ref >39)
LDL Chol Calc (NIH): 75 mg/dL (ref 0–99)
Triglycerides: 85 mg/dL (ref 0–149)
VLDL Cholesterol Cal: 16 mg/dL (ref 5–40)

## 2024-01-05 LAB — CMP14+EGFR
ALT: 17 IU/L (ref 0–32)
AST: 16 IU/L (ref 0–40)
Albumin: 4.1 g/dL (ref 3.8–4.8)
Alkaline Phosphatase: 83 IU/L (ref 44–121)
BUN/Creatinine Ratio: 18 (ref 12–28)
BUN: 10 mg/dL (ref 8–27)
Bilirubin Total: 0.3 mg/dL (ref 0.0–1.2)
CO2: 27 mmol/L (ref 20–29)
Calcium: 9.1 mg/dL (ref 8.7–10.3)
Chloride: 104 mmol/L (ref 96–106)
Creatinine, Ser: 0.55 mg/dL — ABNORMAL LOW (ref 0.57–1.00)
Globulin, Total: 2.6 g/dL (ref 1.5–4.5)
Glucose: 83 mg/dL (ref 70–99)
Potassium: 3.1 mmol/L — ABNORMAL LOW (ref 3.5–5.2)
Sodium: 145 mmol/L — ABNORMAL HIGH (ref 134–144)
Total Protein: 6.7 g/dL (ref 6.0–8.5)
eGFR: 97 mL/min/{1.73_m2} (ref >59)

## 2024-01-05 LAB — IRON,TIBC AND FERRITIN PANEL
Ferritin: 25 ng/mL (ref 15–150)
Iron Saturation: 7 % — CL (ref 15–55)
Iron: 25 ug/dL — ABNORMAL LOW (ref 27–139)
Total Iron Binding Capacity: 339 ug/dL (ref 250–450)
UIBC: 314 ug/dL (ref 118–369)

## 2024-01-05 LAB — B12 AND FOLATE PANEL
Folate: 17.8 ng/mL (ref >3.0)
Vitamin B-12: 959 pg/mL (ref 232–1245)

## 2024-01-05 LAB — VITAMIN D 25 HYDROXY (VIT D DEFICIENCY, FRACTURES): Vit D, 25-Hydroxy: 49.1 ng/mL (ref 30.0–100.0)

## 2024-01-05 LAB — TSH+FREE T4
Free T4: 1.66 ng/dL (ref 0.82–1.77)
TSH: 1.38 u[IU]/mL (ref 0.450–4.500)

## 2024-01-05 LAB — HEMOGLOBIN A1C
Est. average glucose Bld gHb Est-mCnc: 94 mg/dL
Hgb A1c MFr Bld: 4.9 % (ref 4.8–5.6)

## 2024-01-05 MED ORDER — POTASSIUM CHLORIDE CRYS ER 20 MEQ PO TBCR: 40.0000 meq | EXTENDED_RELEASE_TABLET | Freq: Every day | ORAL | 0 refills | Status: DC | PRN

## 2024-01-08 ENCOUNTER — Encounter: Payer: Self-pay | Admitting: Nurse Practitioner

## 2024-01-09 ENCOUNTER — Other Ambulatory Visit: Payer: Self-pay | Admitting: Internal Medicine

## 2024-01-09 ENCOUNTER — Other Ambulatory Visit: Payer: Self-pay | Admitting: Cardiology

## 2024-01-09 DIAGNOSIS — K529 Noninfective gastroenteritis and colitis, unspecified: Secondary | ICD-10-CM

## 2024-01-19 ENCOUNTER — Encounter: Payer: Self-pay | Admitting: Internal Medicine

## 2024-01-19 LAB — BASIC METABOLIC PANEL WITH GFR
BUN/Creatinine Ratio: 20 (ref 12–28)
BUN: 11 mg/dL (ref 8–27)
CO2: 26 mmol/L (ref 20–29)
Calcium: 9.4 mg/dL (ref 8.7–10.3)
Chloride: 104 mmol/L (ref 96–106)
Creatinine, Ser: 0.56 mg/dL — ABNORMAL LOW (ref 0.57–1.00)
Glucose: 117 mg/dL — ABNORMAL HIGH (ref 70–99)
Potassium: 4.3 mmol/L (ref 3.5–5.2)
Sodium: 145 mmol/L — ABNORMAL HIGH (ref 134–144)
eGFR: 96 mL/min/{1.73_m2} (ref >59)

## 2024-01-22 NOTE — Telephone Encounter (Signed)
 Error

## 2024-01-24 DIAGNOSIS — R002 Palpitations: Secondary | ICD-10-CM

## 2024-02-04 ENCOUNTER — Other Ambulatory Visit: Payer: Self-pay | Admitting: Nurse Practitioner

## 2024-02-04 MED ORDER — METOPROLOL TARTRATE 25 MG PO TABS: 12.5000 mg | ORAL_TABLET | Freq: Two times a day (BID) | ORAL | 1 refills | Status: DC

## 2024-02-09 ENCOUNTER — Encounter: Payer: Self-pay | Admitting: Internal Medicine

## 2024-02-09 NOTE — Assessment & Plan Note (Signed)
 Noted on previous labs.  She has been encouraged to increase her daily protein intake.  Will update CMP today.

## 2024-02-09 NOTE — Assessment & Plan Note (Signed)
 She remains on levothyroxine  137 mcg daily.  Repeat thyroid  studies ordered today.

## 2024-02-09 NOTE — Assessment & Plan Note (Signed)
 Noted on previous labs.  She is currently prescribed oral iron supplementation.  Will update CBC and iron studies today.

## 2024-02-09 NOTE — Assessment & Plan Note (Signed)
 Stable, chronic issue.  BP is mildly elevated today.  She remains on midodrine  and Florinef .  Managed per cardiology.  She will follow-up in 6-8 weeks.

## 2024-02-09 NOTE — Assessment & Plan Note (Signed)
 Seen by EP for follow-up in December.  New event monitor is pending as palpitations have increased in frequency.

## 2024-02-09 NOTE — Assessment & Plan Note (Signed)
 History of Roux-en-Y gastric bypass.  Recently underwent exploratory laparotomy with resection of distal Roux limb in the setting of chronic bowel obstruction.  Her postoperative course has been uncomplicated.  Seen by general surgery for follow-up 3/13.  Healing midline abdominal incision noted on exam today.  Will update basic labs.

## 2024-02-14 ENCOUNTER — Other Ambulatory Visit: Payer: Self-pay | Admitting: Internal Medicine

## 2024-02-29 ENCOUNTER — Ambulatory Visit: Attending: Nurse Practitioner | Admitting: Nurse Practitioner

## 2024-02-29 ENCOUNTER — Encounter: Payer: Self-pay | Admitting: Nurse Practitioner

## 2024-02-29 VITALS — BP 140/80 | HR 65 | Ht 63.0 in | Wt 132.0 lb

## 2024-02-29 DIAGNOSIS — I77819 Aortic ectasia, unspecified site: Secondary | ICD-10-CM

## 2024-02-29 DIAGNOSIS — Z86718 Personal history of other venous thrombosis and embolism: Secondary | ICD-10-CM

## 2024-02-29 DIAGNOSIS — R011 Cardiac murmur, unspecified: Secondary | ICD-10-CM

## 2024-02-29 DIAGNOSIS — R002 Palpitations: Secondary | ICD-10-CM

## 2024-02-29 DIAGNOSIS — K551 Chronic vascular disorders of intestine: Secondary | ICD-10-CM

## 2024-02-29 DIAGNOSIS — G4700 Insomnia, unspecified: Secondary | ICD-10-CM

## 2024-02-29 DIAGNOSIS — I471 Supraventricular tachycardia, unspecified: Secondary | ICD-10-CM | POA: Diagnosis not present

## 2024-02-29 DIAGNOSIS — Z8679 Personal history of other diseases of the circulatory system: Secondary | ICD-10-CM

## 2024-02-29 DIAGNOSIS — I2510 Atherosclerotic heart disease of native coronary artery without angina pectoris: Secondary | ICD-10-CM

## 2024-02-29 DIAGNOSIS — Z86711 Personal history of pulmonary embolism: Secondary | ICD-10-CM

## 2024-02-29 MED ORDER — METOPROLOL TARTRATE 25 MG PO TABS: 25.0000 mg | ORAL_TABLET | Freq: Two times a day (BID) | ORAL | 1 refills | Status: DC

## 2024-02-29 NOTE — Patient Instructions (Addendum)
 Medication Instructions:  Your physician has recommended you make the following change in your medication:  Please reduce Midodrine  to 5 Mg Twice daily for 1 week, then reduce to 5 Mg daily for 1 week, then reduce to 2.5 Mg daily for 1 week, Then Stop Midodrine  all together  Please Increase Lopressor  to 25 Mg Twice daily   Labwork: None   Testing/Procedures: Your physician has requested that you have an echocardiogram. Echocardiography is a painless test that uses sound waves to create images of your heart. It provides your doctor with information about the size and shape of your heart and how well your heart's chambers and valves are working. This procedure takes approximately one hour. There are no restrictions for this procedure. Please do NOT wear cologne, perfume, aftershave, or lotions (deodorant is allowed). Please arrive 15 minutes prior to your appointment time.  Please note: We ask at that you not bring children with you during ultrasound (echo/ vascular) testing. Due to room size and safety concerns, children are not allowed in the ultrasound rooms during exams. Our front office staff cannot provide observation of children in our lobby area while testing is being conducted. An adult accompanying a patient to their appointment will only be allowed in the ultrasound room at the discretion of the ultrasound technician under special circumstances. We apologize for any inconvenience.  Follow-Up: Your physician recommends that you schedule a follow-up appointment in: 3-4 months   Any Other Special Instructions Will Be Listed Below (If Applicable).  If you need a refill on your cardiac medications before your next appointment, please call your pharmacy.

## 2024-02-29 NOTE — Progress Notes (Signed)
 Cardiology Office Note:  .   Date:  02/29/2024 ID:  Kayla Dean, DOB May 14, 1951, MRN [id number] PCP: Tobi Fortes, MD  [facility] Providers Cardiologist:  Teddie Favre, MD Electrophysiologist:  Efraim Grange, MD    History of Present Illness: .   Kayla Dean is a 73 y.o. female with a PMH of chest pain, palpitations, hypothyroidism, aortic dilatation, hiatal hernia, and hx of gastric bypass, history of hypertension, recent acute PE/DVT, chronic mesenteric ischemia, who presents today for follow-up.   Last seen by Dr. Londa Rival on November 15, 2022.  She was referred for evaluation by her PCP after ER visit with chest discomfort.  Was ruled out for ACS at that time.  Chest CTA was negative for PE, incidentally noted moderate size hiatal hernia, mild cardiomegaly, ascending thoracic aortic dimension of 4 cm.  At office visit with Dr. Londa Rival, she endorsed right-sided thoracic discomfort/tightness, denied any specific triggers. CCTA revealed mild nonobstructive CAD with evidence of hiatal hernia, borderline dilatation of ascending aorta 39 mm. Cardiac monitor arranged was overall reassuring.   Underwent exploratory laparotomy and reduction of incarcerated hernia in April 2024.  Hospital admission from end of June 2024 to early July 2024 for hypotension, was transferred to [facility] and eventually admitted to ICU and resuscitated with fluids and then subsequently treated with vasopressors, also treated with antibiotics.  Was started on midodrine  and hydrocortisone , blood pressure was stabilized.  Etiology found to be multifactorial.  Suspected volume depletion, ischemic colitis.  Sepsis ruled out.  Hospital course complicated by acute PE and acute right peroneal vein DVT that was felt to be probably aggravated with recent surgery, was started on Eliquis .  05/11/2023 -  BP log that showed consistently low SBP since leaving the hospital with more recently improved BP  readings over the last 24 hours with max BP reading 121/83. Weight is stable.Was feeling better since leaving the hospital, but not as active, admitted to some DOE and leg swelling inhibited her mobility. Denied any chest pain, sustained palpitations, syncope, presyncope, dizziness, orthopnea, PND, significant weight changes, acute bleeding, or claudication. Noted leg swelling had reduced, however still noted pedal/ankle swelling, was currently wearing compression stockings.   After office visit, she noted worsening extremity edema.  Per review of telephone notes, she is also noted low blood pressures while on Midodrine . Consulted Dr. Londa Rival. Recommended abdominal binder and was eventually started on Florinef . Parameters previously given for Lasix .   Mechanical fall on 05/16/2023. Denied LOC. CT of head and neck were negative for acute findings. X-ray of left hand revealed acute fracture of proximal metadiaphysis of left fourth proximal phalanx.   ED visit on 05/23/2023 for leg edema. Along anterior aspect of right lower leg, exam revealed mild area of erythema with clear fluid weeping. Labwork unremarkable, UA revealed UTI. Thought to be beginning of cellulitis. Sent in a Rx for Keflex .   Recent hospital admission due to severe sepsis secondary to severe left upper extremity cellulitis.  Treated with antibiotics.  She was weaned off norepinephrine  infusion, was in ICU for part of her care, transfer to stepdown ICU.  She was evaluated by wound care.  07/24/2023 - Today she presents for hospital follow-up with her daughter.  She states she is overall doing well, continues to note leg edema and weeping coming from her left lower extremity.  Leg swelling appears slightly improved per her report.  Says her skin appears darker than before.  She is continuing antibiotic treatment  for C. Difficile, being managed by PCP.  Blood pressures are better than prior office visit.  Now SBP averaging recently in the 110s/  low 100s. Compliant with meds. Patient states that nursing staff told her during hospital visit that she had 2-3 episodes of A-fib noted on telemetry.  I do not see documentation regarding this. Also tells me she scheduled for endoscopy tomorrow, has not been holding her Eliquis . She will follow-up with hematology next Thursday to see if she can come off Eliquis  due to her DVT.  Overall, she is doing well from a cardiac perspective.  Does admit to rare, mild and brief palpitations about 3 times since I last saw her. Denies any recent chest pain, shortness of breath, syncope, presyncope, orthopnea, PND, swelling or significant weight changes, acute bleeding, or claudication.  08/24/2023 -she is doing better since when I last saw her. Weight is stable. No longer on Eliquis  as she completed 3 months of anticoagulation, medication d/c by Hem/Onc. Denies any chest pain, shortness of breath, palpitations, syncope, presyncope, orthopnea, PND, swelling or significant weight changes, acute bleeding, or claudication. Scheduled for enteroscopy on 08/28/2023.   CT of abdomen in January 2025 revealed abnormal loop of jejunum inferior to the left hepatic lobe with extensive stranding and mesenteric inflammation and multiple lymph nodes, very thickened and edematous when compared to CT scan in September 2024.  There was no obstruction, fluid collections, or free air.  She was started on TPN with tentative plan for OR in February 2025 pending improvement in her prealbumin.  Prealbumin did improve, though not optimal.  She was discharged on TPN in OR was postponed until 12/10/2023.  Did develop acute cystitis and completed antibiotics.  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  Tolerated procedure well.  01/04/2024 - Today she presents for follow-up.  She notes fatigue and intermittent chest tightness recently, denies any triggers,  says it feels like her arms/shoulders are tired.  She says the chest tightness occurs at random times and denies any alleviating or aggravating factors.  Only lasts a few seconds in duration.  Denies any radiation.  Does admit to more noticeable palpitations that occur about 3 times per day and has been more frequent recently. Palpitations are not long in duration, HR has not been elevated but feels as though it is elevated. Denies any shortness of breath, syncope, presyncope, dizziness, orthopnea, PND, swelling or significant weight changes, acute bleeding, or claudication.  02/29/2024 -presents today for follow-up.  Admits to labile sleep patterns.  Does admit to some palpitations at times. Denies any chest pain, shortness of breath, syncope, presyncope, dizziness, orthopnea, PND, swelling or significant weight changes, acute bleeding, or claudication.  She brings in her BP log from home that shows overall SBP averaging 140s to 150s with max of 163.   ROS: Negative. See HPI.   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   Studies Reviewed: Aaron Aas    EKG: EKG is not ordered today.   Cardiac monitor 01/2024:  Predominant rhythm is sinus with heart rate ranging from 46 bpm up to 122 bpm and average heart rate 69 bpm. There were occasional PACs representing 1.4% total beats with otherwise rare atrial couplets and triplets. There were rare PVCs including ventricular couplets and triplets representing less than 1% total beats. Multiple (94) episodes of PSVT were noted, the longest of which lasted for 20 minutes and 32  seconds with average heart rate 107 bpm.  Some correlation noted with patient triggered activity. No pauses or high degree heart block.  Cardiac monitor 07/2023: Predominant rhythm is sinus with heart rate ranging from 58 bpm up to 148 bpm and average heart rate 86 bpm. There were rare PACs including atrial couplets and triplets representing less than 1% total beats. There were rare PVCs  including ventricular couplets and triplets representing less than 1% total beats. There were 2 episodes of NSVT, the longest of which was 7 beats. Multiple (39) episodes of PSVT were noted, the longest of which lasted 27 seconds with average heart rate in the 150s.  No atrial fibrillation noted. No pauses or high degree heart block.  Vascular ultrasound lower extremity venous bilateral (04/21/2023): Summary:  RIGHT:  - Findings consistent with acute deep vein thrombosis involving the right  peroneal veins.  - No cystic structure found in the popliteal fossa.    LEFT:  - There is no evidence of deep vein thrombosis in the lower extremity.    - No cystic structure found in the popliteal fossa.  Echo 03/2023: 1. Left ventricular ejection fraction, by estimation, is 60 to 65%. The  left ventricle has normal function. The left ventricle has no regional  wall motion abnormalities. Left ventricular diastolic parameters were  normal.   2. Right ventricular systolic function is normal. The right ventricular  size is normal.   3. Left atrial size was mildly dilated.   4. The mitral valve is abnormal. Trivial mitral valve regurgitation. No  evidence of mitral stenosis.   5. The aortic valve is tricuspid. There is moderate calcification of the  aortic valve. There is moderate thickening of the aortic valve. Aortic  valve regurgitation is mild. Aortic valve sclerosis is present, with no  evidence of aortic valve stenosis.   6. The inferior vena cava is normal in size with greater than 50%  respiratory variability, suggesting right atrial pressure of 3 mmHg.  Cardiac monitor 11/2022: Predominant rhythm is sinus with heart rate ranging from 53 bpm up to 145 bpm and average heart rate 77 bpm. There were rare PACs including atrial couplets and triplets representing less than 1% total beats. There were rare PVCs including ventricular couplets representing less than 1% total beats.  Also limited  episodes of ventricular bigeminy and trigeminy. There were two brief episodes of SVT, the longest of which lasted for 7 beats.  Neither of these were patient triggered events. No sustained arrhythmias or pauses.  CCTA 11/2022: IMPRESSION: 1. Mild nonobstructive CAD, CADRADS = 2.   2. Coronary calcium  score of 122. This was 71st percentile for age and sex matched control.   3. Normal coronary origin with right dominance.   4.  Hiatal hernia present.   5.  Borderline dilation of ascending aorta at 39 mm.  Lexiscan  07/2018: There was no ST segment deviation noted during stress. The study is normal. There are no perfusion defects consistent with prior infarct or ischemia. The left ventricular ejection fraction is hyperdynamic (>65%). This is a low risk study.  Physical Exam:   VS:  BP (!) 140/80   Pulse 65   Ht 5\' 3"  (1.6 m)   Wt 132 lb (59.9 kg)   SpO2 98%   BMI 23.38 kg/m    Wt Readings from Last 3 Encounters:  02/29/24 132 lb (59.9 kg)  01/04/24 129 lb 12.8 oz (58.9 kg)  01/04/24 128 lb 14.4 oz (58.5 kg)  GEN: Well nourished, well developed in no acute distress NECK: No JVD; No carotid bruits CARDIAC: S1/S2, RRR, grade 1/6 murmur, no rubs, no gallops RESPIRATORY:  Clear to auscultation without rales, wheezing or rhonchi  ABDOMEN: Soft, non-tender, non-distended EXTREMITIES:  no edema to BLE; No deformity   ASSESSMENT AND PLAN: .    Mild CAD Stable with no anginal symptoms. No indication for ischemic evaluation. CCTA last year revealed coronary calcium  score of 122, mild/nonobstructive CAD. No indication for ischemic evaluation at this time. Continue current medication regimen. Care and ED precautions discussed.   Palpitations, PSVT Does notice continued palpitations. HR well controlled on exam.  See most recent monitor report noted above.  Will take now Lopressor  25 mg twice daily and instructed her regarding weaning off midodrine .  She will take 5 mg of midodrine   twice per day for the next week, then followed by 5 mg for 1 week, then she will take 2.5 mg midodrine  for 1 week, then when she will stop medicine altogether.  Heart healthy diet and regular cardiovascular exercise encouraged. Care and ED precautions discussed.  Hx of hypotension Seems to have resolved. Starting titration midodrine  off as noted above. No other medication changes at this time.  Discussed to monitor BP at home at least 2 hours after medications and sitting for 5-10 minutes. Given BP log and salty six sheet today. Care and ED precautions discussed. Continue to follow with PCP.  Hx of PE/DVT  CT of abdomen 04/2023 revealed PE of right interlobar pulmonary artery and proximal and subsegmental pulmonary arteries. Etiology felt to be aggravated by recent surgery and acute DVT noted along right peroneal veins. Completed Eliquis  and medication d/c by Hematology/Oncology. Continue to follow with PCP.  Aortic dilatation, murmur CCTA 11/2022 showed borderline dilatation of ascending aorta at 39 mm. Care and ED precautions discussed. Continue to follow with PCP.  Grade 1/6 murmur noted on exam. Will update Echo at this time.   Chronic mesenteric ischemia She is status post open resection of distal Roux limb. Anastomosed roux to BP more approximately.  Tolerated procedure well. Currently being followed by general surgery. Continue to follow-up with Care team.  7. Insomnia Recommended to follow-up with PCP for further evaluation and management.  Dispo: Follow-up with me or APP in 3-4 months or sooner if anything changes.    Signed, Lasalle Pointer, NP

## 2024-03-17 ENCOUNTER — Ambulatory Visit: Admitting: Nurse Practitioner

## 2024-03-20 ENCOUNTER — Ambulatory Visit: Attending: Nurse Practitioner

## 2024-03-20 DIAGNOSIS — R011 Cardiac murmur, unspecified: Secondary | ICD-10-CM

## 2024-03-20 LAB — ECHOCARDIOGRAM COMPLETE
AR max vel: 2.21 cm2
AV Area VTI: 2.05 cm2
AV Area mean vel: 2.29 cm2
AV Mean grad: 6 mmHg
AV Peak grad: 11 mmHg
AV Vena cont: 0.6 cm
Ao pk vel: 1.66 m/s
Area-P 1/2: 4.63 cm2
Calc EF: 54.5 %
MV VTI: 2.69 cm2
P 1/2 time: 626 ms
S' Lateral: 3.4 cm
Single Plane A2C EF: 49.5 %
Single Plane A4C EF: 57.6 %

## 2024-03-24 MED ORDER — METOPROLOL TARTRATE 25 MG PO TABS: 25.0000 mg | ORAL_TABLET | Freq: Two times a day (BID) | ORAL | 3 refills | Status: AC

## 2024-03-25 ENCOUNTER — Other Ambulatory Visit

## 2024-04-09 ENCOUNTER — Other Ambulatory Visit: Payer: Self-pay | Admitting: Internal Medicine

## 2024-04-10 ENCOUNTER — Ambulatory Visit (INDEPENDENT_AMBULATORY_CARE_PROVIDER_SITE_OTHER): Admitting: Internal Medicine

## 2024-04-10 ENCOUNTER — Encounter: Payer: Self-pay | Admitting: Internal Medicine

## 2024-04-10 ENCOUNTER — Ambulatory Visit

## 2024-04-10 VITALS — BP 134/70 | HR 74 | Ht 63.0 in | Wt 139.6 lb

## 2024-04-10 DIAGNOSIS — F5101 Primary insomnia: Secondary | ICD-10-CM

## 2024-04-10 DIAGNOSIS — K219 Gastro-esophageal reflux disease without esophagitis: Secondary | ICD-10-CM

## 2024-04-10 DIAGNOSIS — Z1159 Encounter for screening for other viral diseases: Secondary | ICD-10-CM

## 2024-04-10 DIAGNOSIS — E039 Hypothyroidism, unspecified: Secondary | ICD-10-CM

## 2024-04-10 DIAGNOSIS — I471 Supraventricular tachycardia, unspecified: Secondary | ICD-10-CM | POA: Diagnosis not present

## 2024-04-10 DIAGNOSIS — M51362 Other intervertebral disc degeneration, lumbar region with discogenic back pain and lower extremity pain: Secondary | ICD-10-CM | POA: Insufficient documentation

## 2024-04-10 MED ORDER — GABAPENTIN 300 MG PO CAPS: 300.0000 mg | ORAL_CAPSULE | Freq: Every day | ORAL | 1 refills | Status: DC

## 2024-04-10 MED ORDER — CYCLOBENZAPRINE HCL 5 MG PO TABS: 5.0000 mg | ORAL_TABLET | Freq: Every day | ORAL | 1 refills | Status: DC

## 2024-04-10 NOTE — Assessment & Plan Note (Signed)
 Followed by cardiology On metoprolol  25 mg BID

## 2024-04-10 NOTE — Assessment & Plan Note (Signed)
 Lab Results  Component Value Date   TSH 1.380 01/04/2024   On levothyroxine  137 mcg QD Check TSH and free T4 - has diffuse myalgias and palpitations currently

## 2024-04-10 NOTE — Assessment & Plan Note (Signed)
 Has difficulty initiating and maintaining sleep She has tried Ambien  in the past Since she is going to start gabapentin today, will defer on starting any other medicine for now

## 2024-04-10 NOTE — Assessment & Plan Note (Signed)
 Chronic low back pain, with muscle stiffness and radicular symptoms to bilateral LE Check x-ray of lumbar spine Started gabapentin 300 mg nightly Flexeril as needed for muscle spasms Avoid heavy lifting and frequent bending Simple back exercises material provided

## 2024-04-10 NOTE — Patient Instructions (Addendum)
 Please schedule Bone Density.  Please start taking Gabapentin as prescribed.  Please start taking Flexeril as needed for muscle spasms.  Please continue to take other medications as prescribed.  Please continue to follow low carb diet and perform simple back exercises as shown in the material.

## 2024-04-10 NOTE — Progress Notes (Signed)
 Established Patient Office Visit  Subjective:  Patient ID: Kayla Dean, female    DOB: 04/17/1951  Age: 73 y.o. MRN: [id number]  CC:  Chief Complaint  Patient presents with   Medical Management of Chronic Issues    3 months f/u , reports trouble sleeping, and low back pain , and body aching all over onging for 1 month.     HPI Kayla Dean is a 73 y.o. female with past medical history of PSVT, GERD and hypothyroidism who presents for f/u of her chronic medical conditions.  She was previously seen by Dr. Melvenia.  PSVT: Followed by cardiology.  On metoprolol  25 mg BID.  She still has palpitations at times.  Denies any chest pain or dyspnea currently.  Hypothyroidism: She takes levothyroxine  137 mcg daily.  Denies any recent change in appetite.  She reports diffuse muscle aches.  GERD: She takes pantoprazole  40 mg BID.  She still reports acid reflux, especially in the morning.  Denies dysphagia or odynophagia.  She reports chronic low back pain, worse in the morning, intermittent, radiating to bilateral LE.  She has tried taking Tylenol  arthritis without much relief.  She also reports diffuse muscle aches.  Of note, she has insomnia, difficulty initiating and maintaining sleep.  Denies anhedonia, SI or HI currently.  Past Medical History:  Diagnosis Date   Anemia    Aortic aneurysm (HCC)    Arthritis    C. difficile colitis 07/20/2023   GERD (gastroesophageal reflux disease)    H/O gastric bypass    Hiatal hernia    Hypothyroidism    Osteoporosis    Shock liver 02/03/2023   Small bowel obstruction [facility])     Past Surgical History:  Procedure Laterality Date   ABDOMINAL HYSTERECTOMY     BIOPSY  08/28/2023   Procedure: BIOPSY;  Surgeon: Kayla Carlin POUR, DO;  Location: [facility];  Service: Endoscopy;;   CATARACT EXTRACTION Bilateral    CHOLECYSTECTOMY     COLON SURGERY     ENTEROSCOPY N/A 08/28/2023   Procedure: ENTEROSCOPY;  Surgeon: Kayla Carlin POUR, DO;   Location: [facility];  Service: Endoscopy;  Laterality: N/A;  1015am, asa 3   ESOPHAGOGASTRODUODENOSCOPY (EGD) WITH PROPOFOL  N/A 08/28/2023   Procedure: ESOPHAGOGASTRODUODENOSCOPY (EGD) WITH PROPOFOL ;  Surgeon: Kayla Carlin POUR, DO;  Location: [facility];  Service: Endoscopy;  Laterality: N/A;   EYE SURGERY     Cataract   FRACTURE SURGERY     GASTRIC BYPASS     HERNIA REPAIR     hiatal hernia   JOINT REPLACEMENT     LAPAROTOMY N/A 02/02/2023   Procedure: EXPLORATORY LAPAROTOMY, [name], EXPLANT OF ABDOMINAL MESH;  Surgeon: Kayla Manuelita BROCKS, MD;  Location: [facility];  Service: General;  Laterality: N/A;   REPLACEMENT TOTAL KNEE Right    ROTATOR CUFF REPAIR Right    SMALL INTESTINE SURGERY  02/02/2023   TOTAL HIP ARTHROPLASTY Right     Family History  Problem Relation Age of Onset   CAD Father    Heart disease Father    CAD Brother    Heart disease Brother     Social History   Socioeconomic History   Marital status: Widowed    Spouse name: Not on file   Number of children: Not on file   Years of education: Not on file   Highest education level: Associate degree: academic program  Occupational History   Not on file  Tobacco Use  Smoking status: Never   Smokeless tobacco: Never  Vaping Use   Vaping status: Never Used  Substance and Sexual Activity   Alcohol use: Never   Drug use: Never   Sexual activity: Not Currently    Birth control/protection: Abstinence, None  Other Topics Concern   Not on file  Social History Narrative   Not on file   Social Drivers of Health   Financial Resource Strain: Low Risk  (04/06/2024)   Overall Financial Resource Strain (CARDIA)    Difficulty of Paying Living Expenses: Not hard at all  Food Insecurity: No Food Insecurity (04/06/2024)   Hunger Vital Sign    Worried About Running Out of Food in the Last Year: Never true    Ran Out of Food in the Last Year: Never true  Transportation Needs: No  Transportation Needs (04/06/2024)   PRAPARE - Transportation    Lack of Transportation (Medical): No    Lack of Transportation (Non-Medical): No  Physical Activity: Insufficiently Active (04/06/2024)   Exercise Vital Sign    Days of Exercise per Week: 2 days    Minutes of Exercise per Session: 20 min  Stress: Stress Concern Present (04/06/2024)   [employer] of Occupational Health - Occupational Stress Questionnaire    Feeling of Stress: To some extent  Social Connections: Moderately Integrated (04/06/2024)   Social Connection and Isolation Panel    Frequency of Communication with Friends and Family: More than three times a week    Frequency of Social Gatherings with Friends and Family: Three times a week    Attends Religious Services: More than 4 times per year    Active Member of Clubs or Organizations: Yes    Attends Banker Meetings: More than 4 times per year    Marital Status: Widowed  Intimate Partner Violence: Not At Risk (10/24/2023)   Humiliation, Afraid, Rape, and Kick questionnaire    Fear of Current or Ex-Partner: No    Emotionally Abused: No    Physically Abused: No    Sexually Abused: No    Outpatient Medications Prior to Visit  Medication Sig Dispense Refill   ascorbic acid (VITAMIN C) 500 MG tablet Take 500 mg by mouth daily.     BIOTIN 5000 PO Take by mouth.     Calcium  Carb-Cholecalciferol (CALCIUM  600 + D) 600-200 MG-UNIT TABS Take 1 tablet by mouth every morning.     Cholecalciferol (VITAMIN D -3 PO) Take 1 capsule by mouth daily.     Elastic Bandages & Supports (ABDOMINAL BINDER/ELASTIC MED) MISC Apply as directed for orthostatic hypotension 1 each 1   fludrocortisone  (FLORINEF ) 0.1 MG tablet Take 1 tablet (0.1 mg total) by mouth daily. 30 tablet 6   furosemide  (LASIX ) 20 MG tablet Take 1 tablet (20 mg total) by mouth daily as needed for fluid or edema. 90 tablet 0   Lactobacillus (PROBIOTIC ACIDOPHILUS) CAPS Take 1 capsule by mouth 2 (two) times  daily.     levothyroxine  (SYNTHROID ) 137 MCG tablet TAKE ONE TABLET BY MOUTH ONCE DAILY BEFORE BREAKFAST 90 tablet 0   metoprolol  tartrate (LOPRESSOR ) 25 MG tablet Take 1 tablet (25 mg total) by mouth 2 (two) times daily. 180 tablet 3   Multiple Vitamin (MULTIVITAMIN WITH MINERALS) TABS tablet Take 1 tablet by mouth every morning.     ondansetron  (ZOFRAN -ODT) 4 MG disintegrating tablet Take 1 tablet (4 mg total) by mouth every 6 (six) hours as needed for nausea. 20 tablet 0   pantoprazole  (PROTONIX ) 40 MG  tablet TAKE ONE TABLET BY MOUTH TWICE DAILY 60 tablet 2   potassium chloride  SA (KLOR-CON  M) 20 MEQ tablet Take 2 tablets (40 mEq total) by mouth daily as needed (When taking Lasix ). 180 tablet 0   saccharomyces boulardii (FLORASTOR) 250 MG capsule Take 1 capsule (250 mg total) by mouth 2 (two) times daily. 60 capsule 0   sucralfate  (CARAFATE ) 1 g tablet TAKE ONE TABLET BY MOUTH FOUR TIMES DAILY WITH MEALS AND AT BEDTIME. 120 tablet 2   zolpidem  (AMBIEN ) 5 MG tablet Take 1 tablet (5 mg total) by mouth at bedtime as needed for sleep. 30 tablet 0   oxyCODONE  (OXY IR/ROXICODONE ) 5 MG immediate release tablet Take 1 tablet (5 mg total) by mouth every 4 (four) hours as needed for severe pain (pain score 7-10) or breakthrough pain. 20 tablet 0   No facility-administered medications prior to visit.    No Known Allergies  ROS Review of Systems  Constitutional:  Positive for fatigue. Negative for chills and fever.  HENT:  Negative for congestion, sinus pressure, sinus pain and sore throat.   Eyes:  Negative for pain and discharge.  Respiratory:  Negative for cough and shortness of breath.   Cardiovascular:  Positive for leg swelling. Negative for chest pain and palpitations.  Gastrointestinal:  Negative for abdominal pain, diarrhea, nausea and vomiting.  Endocrine: Negative for polydipsia and polyuria.  Genitourinary:  Negative for dysuria and hematuria.  Musculoskeletal:  Positive for  arthralgias, back pain and myalgias. Negative for neck pain and neck stiffness.  Skin:  Negative for rash.  Neurological:  Positive for weakness. Negative for dizziness.  Psychiatric/Behavioral:  Negative for agitation and behavioral problems.       Objective:    Physical Exam Vitals reviewed.  Constitutional:      General: She is not in acute distress.    Appearance: She is obese. She is not diaphoretic.  HENT:     Head: Normocephalic and atraumatic.     Nose: Nose normal. No congestion.     Mouth/Throat:     Mouth: Mucous membranes are moist.     Pharynx: No posterior oropharyngeal erythema.   Eyes:     General: No scleral icterus.    Extraocular Movements: Extraocular movements intact.    Cardiovascular:     Rate and Rhythm: Normal rate and regular rhythm.     Heart sounds: Normal heart sounds. No murmur heard. Pulmonary:     Breath sounds: Normal breath sounds. No wheezing or rales.   Musculoskeletal:     Cervical back: Neck supple. No tenderness.     Lumbar back: Tenderness present. Decreased range of motion.     Left knee: Swelling present. Tenderness present.     Right lower leg: No edema.     Left lower leg: No edema.   Skin:    General: Skin is warm.     Findings: No rash.   Neurological:     General: No focal deficit present.     Mental Status: She is alert and oriented to person, place, and time.   Psychiatric:        Mood and Affect: Mood normal.        Behavior: Behavior normal.     BP 134/70 (BP Location: Right Arm)   Pulse 74   Ht 5' 3 (1.6 m)   Wt 139 lb 9.6 oz (63.3 kg)   SpO2 96%   BMI 24.73 kg/m  Wt Readings from Last 3 Encounters:  04/10/24 139 lb 9.6 oz (63.3 kg)  02/29/24 132 lb (59.9 kg)  01/04/24 129 lb 12.8 oz (58.9 kg)    Lab Results  Component Value Date   TSH 1.380 01/04/2024   Lab Results  Component Value Date   WBC 6.2 01/04/2024   HGB 11.4 01/04/2024   HCT 36.8 01/04/2024   MCV 90 01/04/2024   PLT 186  01/04/2024   Lab Results  Component Value Date   NA 145 (H) 01/18/2024   K 4.3 01/18/2024   CO2 26 01/18/2024   GLUCOSE 117 (H) 01/18/2024   BUN 11 01/18/2024   CREATININE 0.56 (L) 01/18/2024   BILITOT 0.3 01/04/2024   ALKPHOS 83 01/04/2024   AST 16 01/04/2024   ALT 17 01/04/2024   PROT 6.7 01/04/2024   ALBUMIN  4.1 01/04/2024   CALCIUM  9.4 01/18/2024   ANIONGAP 6 09/04/2023   EGFR 96 01/18/2024   Lab Results  Component Value Date   CHOL 147 01/04/2024   Lab Results  Component Value Date   HDL 56 01/04/2024   Lab Results  Component Value Date   LDLCALC 75 01/04/2024   Lab Results  Component Value Date   TRIG 85 01/04/2024   Lab Results  Component Value Date   CHOLHDL 2.6 01/04/2024   Lab Results  Component Value Date   HGBA1C 4.9 01/04/2024      Assessment & Plan:   Problem List Items Addressed This Visit       Cardiovascular and Mediastinum   PSVT (paroxysmal supraventricular tachycardia) (HCC)   Followed by cardiology On metoprolol  25 mg BID      Relevant Orders   CBC with Differential/Platelet   CMP14+EGFR     Digestive   Gastroesophageal reflux disease - Primary   On pantoprazole  40 mg BID Has sucralfate  as needed for persistent acid reflux Advised to avoid hot and spicy food If persistent, will need GI evaluation        Endocrine   Acquired hypothyroidism   Lab Results  Component Value Date   TSH 1.380 01/04/2024   On levothyroxine  137 mcg QD Check TSH and free T4 - has diffuse myalgias and palpitations currently      Relevant Orders   TSH + free T4     Musculoskeletal and Integument   Degeneration of intervertebral disc of lumbar region with discogenic back pain and lower extremity pain   Chronic low back pain, with muscle stiffness and radicular symptoms to bilateral LE Check x-ray of lumbar spine Started gabapentin 300 mg nightly Flexeril as needed for muscle spasms Avoid heavy lifting and frequent bending Simple back  exercises material provided      Relevant Medications   cyclobenzaprine (FLEXERIL) 5 MG tablet   gabapentin (NEURONTIN) 300 MG capsule   Other Relevant Orders   DG Lumbar Spine Complete     Other   Insomnia   Has difficulty initiating and maintaining sleep She has tried Ambien  in the past Since she is going to start gabapentin today, will defer on starting any other medicine for now      Other Visit Diagnoses       Need for hepatitis C screening test       Relevant Orders   Hepatitis C Antibody       Meds ordered this encounter  Medications   cyclobenzaprine (FLEXERIL) 5 MG tablet    Sig: Take 1 tablet (5 mg total) by mouth at bedtime.    Dispense:  30 tablet  Refill:  1   gabapentin (NEURONTIN) 300 MG capsule    Sig: Take 1 capsule (300 mg total) by mouth at bedtime.    Dispense:  90 capsule    Refill:  1    Follow-up: Return in about 4 months (around 08/10/2024) for HTN and hypothyroidism.    Suzzane MARLA Blanch, MD

## 2024-04-10 NOTE — Assessment & Plan Note (Signed)
 On pantoprazole  40 mg BID Has sucralfate  as needed for persistent acid reflux Advised to avoid hot and spicy food If persistent, will need GI evaluation

## 2024-04-10 NOTE — Progress Notes (Signed)
 Patient was seen today for a Diabetic Retinal Screening. I was unsuccessful at getting good images of her eyes because I could not get her pupils to dilate enough.   Patient says she does see Dr Odetta for her regular eye exams. She denies having any issues with her eyes other than sometimes she says her vision can get a little cloudy but she says she has had cataracts removed about 10 yrs ago so that could be where the cloudy vision is coming from. I advised her that if she had any issues with her eyes, she should contact her eye doctor.

## 2024-04-11 ENCOUNTER — Ambulatory Visit: Payer: Self-pay | Admitting: Internal Medicine

## 2024-04-11 LAB — CMP14+EGFR
ALT: 19 IU/L (ref 0–32)
AST: 24 IU/L (ref 0–40)
Albumin: 4 g/dL (ref 3.8–4.8)
Alkaline Phosphatase: 109 IU/L (ref 44–121)
BUN/Creatinine Ratio: 17 (ref 12–28)
BUN: 10 mg/dL (ref 8–27)
Bilirubin Total: 0.4 mg/dL (ref 0.0–1.2)
CO2: 24 mmol/L (ref 20–29)
Calcium: 8.8 mg/dL (ref 8.7–10.3)
Chloride: 103 mmol/L (ref 96–106)
Creatinine, Ser: 0.6 mg/dL (ref 0.57–1.00)
Globulin, Total: 2.6 g/dL (ref 1.5–4.5)
Glucose: 87 mg/dL (ref 70–99)
Potassium: 3.7 mmol/L (ref 3.5–5.2)
Sodium: 144 mmol/L (ref 134–144)
Total Protein: 6.6 g/dL (ref 6.0–8.5)
eGFR: 95 mL/min/{1.73_m2} (ref >59)

## 2024-04-11 LAB — TSH+FREE T4
Free T4: 1.39 ng/dL (ref 0.82–1.77)
TSH: 3.71 u[IU]/mL (ref 0.450–4.500)

## 2024-04-11 LAB — CBC WITH DIFFERENTIAL/PLATELET
Basophils Absolute: 0.1 10*3/uL (ref 0.0–0.2)
Basos: 1 %
EOS (ABSOLUTE): 0.1 10*3/uL (ref 0.0–0.4)
Eos: 1 %
Hematocrit: 40.5 % (ref 34.0–46.6)
Hemoglobin: 12.9 g/dL (ref 11.1–15.9)
Immature Grans (Abs): 0 10*3/uL (ref 0.0–0.1)
Immature Granulocytes: 0 %
Lymphocytes Absolute: 2.7 10*3/uL (ref 0.7–3.1)
Lymphs: 38 %
MCH: 29.9 pg (ref 26.6–33.0)
MCHC: 31.9 g/dL (ref 31.5–35.7)
MCV: 94 fL (ref 79–97)
Monocytes Absolute: 0.7 10*3/uL (ref 0.1–0.9)
Monocytes: 10 %
Neutrophils Absolute: 3.5 10*3/uL (ref 1.4–7.0)
Neutrophils: 50 %
Platelets: 196 10*3/uL (ref 150–450)
RBC: 4.31 x10E6/uL (ref 3.77–5.28)
RDW: 15.3 % (ref 11.7–15.4)
WBC: 7.1 10*3/uL (ref 3.4–10.8)

## 2024-04-11 LAB — HEPATITIS C ANTIBODY: Hep C Virus Ab: NONREACTIVE

## 2024-04-12 ENCOUNTER — Ambulatory Visit: Payer: Self-pay | Admitting: Nurse Practitioner

## 2024-04-15 ENCOUNTER — Ambulatory Visit (HOSPITAL_COMMUNITY)
Admission: RE | Admit: 2024-04-15 | Discharge: 2024-04-15 | Disposition: A | Discharge status: Home / Self Care | Source: Ambulatory Visit | Attending: Internal Medicine | Admitting: Internal Medicine

## 2024-04-15 DIAGNOSIS — Z0000 Encounter for general adult medical examination without abnormal findings: Secondary | ICD-10-CM | POA: Diagnosis present

## 2024-04-15 DIAGNOSIS — Z780 Asymptomatic menopausal state: Secondary | ICD-10-CM | POA: Diagnosis present

## 2024-04-15 DIAGNOSIS — M51362 Other intervertebral disc degeneration, lumbar region with discogenic back pain and lower extremity pain: Secondary | ICD-10-CM | POA: Insufficient documentation

## 2024-04-21 ENCOUNTER — Ambulatory Visit: Payer: Self-pay | Admitting: Internal Medicine

## 2024-04-28 ENCOUNTER — Other Ambulatory Visit: Payer: Self-pay | Admitting: Internal Medicine

## 2024-04-28 DIAGNOSIS — M810 Age-related osteoporosis without current pathological fracture: Secondary | ICD-10-CM | POA: Insufficient documentation

## 2024-04-28 MED ORDER — ALENDRONATE SODIUM 70 MG PO TABS
70.0000 mg | ORAL_TABLET | ORAL | 11 refills | Status: AC
Start: 2024-04-28 — End: ?

## 2024-04-30 ENCOUNTER — Other Ambulatory Visit: Payer: Self-pay | Admitting: Internal Medicine

## 2024-04-30 DIAGNOSIS — K529 Noninfective gastroenteritis and colitis, unspecified: Secondary | ICD-10-CM

## 2024-05-09 ENCOUNTER — Encounter: Payer: Self-pay | Admitting: Internal Medicine

## 2024-05-25 ENCOUNTER — Other Ambulatory Visit: Payer: Self-pay | Admitting: Internal Medicine

## 2024-05-26 ENCOUNTER — Other Ambulatory Visit: Payer: Self-pay | Admitting: Internal Medicine

## 2024-06-06 ENCOUNTER — Encounter: Payer: Self-pay | Admitting: Radiology

## 2024-06-10 ENCOUNTER — Other Ambulatory Visit: Payer: Self-pay | Admitting: Internal Medicine

## 2024-06-10 DIAGNOSIS — M51362 Other intervertebral disc degeneration, lumbar region with discogenic back pain and lower extremity pain: Secondary | ICD-10-CM

## 2024-07-01 ENCOUNTER — Encounter: Payer: Self-pay | Admitting: Nurse Practitioner

## 2024-07-01 ENCOUNTER — Ambulatory Visit: Attending: Nurse Practitioner | Admitting: Nurse Practitioner

## 2024-07-01 VITALS — BP 138/88 | HR 66 | Ht 63.0 in | Wt 144.8 lb

## 2024-07-01 DIAGNOSIS — Z86711 Personal history of pulmonary embolism: Secondary | ICD-10-CM

## 2024-07-01 DIAGNOSIS — I38 Endocarditis, valve unspecified: Secondary | ICD-10-CM

## 2024-07-01 DIAGNOSIS — I2510 Atherosclerotic heart disease of native coronary artery without angina pectoris: Secondary | ICD-10-CM

## 2024-07-01 DIAGNOSIS — R002 Palpitations: Secondary | ICD-10-CM | POA: Diagnosis not present

## 2024-07-01 DIAGNOSIS — I471 Supraventricular tachycardia, unspecified: Secondary | ICD-10-CM | POA: Diagnosis not present

## 2024-07-01 DIAGNOSIS — I77819 Aortic ectasia, unspecified site: Secondary | ICD-10-CM

## 2024-07-01 DIAGNOSIS — Z8679 Personal history of other diseases of the circulatory system: Secondary | ICD-10-CM | POA: Diagnosis not present

## 2024-07-01 DIAGNOSIS — Z86718 Personal history of other venous thrombosis and embolism: Secondary | ICD-10-CM

## 2024-07-01 DIAGNOSIS — Z01818 Encounter for other preprocedural examination: Secondary | ICD-10-CM

## 2024-07-01 DIAGNOSIS — K551 Chronic vascular disorders of intestine: Secondary | ICD-10-CM

## 2024-07-01 NOTE — Patient Instructions (Addendum)

## 2024-07-01 NOTE — Progress Notes (Addendum)
 Cardiology Office Note:  .   Date:  07/01/2024 ID:  Kayla Dean, DOB July 24, 1951, MRN [id number] PCP: Tobie Suzzane POUR, MD  [facility] Providers Cardiologist:  Jayson Sierras, MD Electrophysiologist:  Eulas FORBES Furbish, MD    History of Present Illness: .   Kayla Dean is a 73 y.o. female with a PMH of chest pain, palpitations, hypothyroidism, aortic dilatation, hiatal hernia, and hx of gastric bypass, history of hypertension, recent acute PE/DVT, chronic mesenteric ischemia, who presents today for follow-up.   Last seen by Dr. Sierras on November 15, 2022.  She was referred for evaluation by her PCP after ER visit with chest discomfort.  Was ruled out for ACS at that time.  Chest CTA was negative for PE, incidentally noted moderate size hiatal hernia, mild cardiomegaly, ascending thoracic aortic dimension of 4 cm.  At office visit with Dr. Sierras, she endorsed right-sided thoracic discomfort/tightness, denied any specific triggers. CCTA revealed mild nonobstructive CAD with evidence of hiatal hernia, borderline dilatation of ascending aorta 39 mm. Cardiac monitor arranged was overall reassuring.   Underwent exploratory laparotomy and reduction of incarcerated hernia in April 2024.  Hospital admission from end of June 2024 to early July 2024 for hypotension, was transferred to [facility] and eventually admitted to ICU and resuscitated with fluids and then subsequently treated with vasopressors, also treated with antibiotics.  Was started on midodrine  and hydrocortisone , blood pressure was stabilized.  Etiology found to be multifactorial.  Suspected volume depletion, ischemic colitis.  Sepsis ruled out.  Hospital course complicated by acute PE and acute right peroneal vein DVT that was felt to be probably aggravated with recent surgery, was started on Eliquis .  05/11/2023 -  BP log that showed consistently low SBP since leaving the hospital with more recently improved BP  readings over the last 24 hours with max BP reading 121/83. Weight is stable.Was feeling better since leaving the hospital, but not as active, admitted to some DOE and leg swelling inhibited her mobility. Denied any chest pain, sustained palpitations, syncope, presyncope, dizziness, orthopnea, PND, significant weight changes, acute bleeding, or claudication. Noted leg swelling had reduced, however still noted pedal/ankle swelling, was currently wearing compression stockings.   After office visit, she noted worsening extremity edema.  Per review of telephone notes, she is also noted low blood pressures while on Midodrine . Consulted Dr. Sierras. Recommended abdominal binder and was eventually started on Florinef . Parameters previously given for Lasix .   Mechanical fall on 05/16/2023. Denied LOC. CT of head and neck were negative for acute findings. X-ray of left hand revealed acute fracture of proximal metadiaphysis of left fourth proximal phalanx.   ED visit on 05/23/2023 for leg edema. Along anterior aspect of right lower leg, exam revealed mild area of erythema with clear fluid weeping. Labwork unremarkable, UA revealed UTI. Thought to be beginning of cellulitis. Sent in a Rx for Keflex .   Recent hospital admission due to severe sepsis secondary to severe left upper extremity cellulitis.  Treated with antibiotics.  She was weaned off norepinephrine  infusion, was in ICU for part of her care, transfer to stepdown ICU.  She was evaluated by wound care.  07/24/2023 - Today she presents for hospital follow-up with her daughter.  She states she is overall doing well, continues to note leg edema and weeping coming from her left lower extremity.  Leg swelling appears slightly improved per her report.  Says her skin appears darker than before.  She is continuing antibiotic treatment  for C. Difficile, being managed by PCP.  Blood pressures are better than prior office visit.  Now SBP averaging recently in the 110s/  low 100s. Compliant with meds. Patient states that nursing staff told her during hospital visit that she had 2-3 episodes of A-fib noted on telemetry.  I do not see documentation regarding this. Also tells me she scheduled for endoscopy tomorrow, has not been holding her Eliquis . She will follow-up with hematology next Thursday to see if she can come off Eliquis  due to her DVT.  Overall, she is doing well from a cardiac perspective.  Does admit to rare, mild and brief palpitations about 3 times since I last saw her. Denies any recent chest pain, shortness of breath, syncope, presyncope, orthopnea, PND, swelling or significant weight changes, acute bleeding, or claudication.  08/24/2023 -she is doing better since when I last saw her. Weight is stable. No longer on Eliquis  as she completed 3 months of anticoagulation, medication d/c by Hem/Onc. Denies any chest pain, shortness of breath, palpitations, syncope, presyncope, orthopnea, PND, swelling or significant weight changes, acute bleeding, or claudication. Scheduled for enteroscopy on 08/28/2023.   CT of abdomen in January 2025 revealed abnormal loop of jejunum inferior to the left hepatic lobe with extensive stranding and mesenteric inflammation and multiple lymph nodes, very thickened and edematous when compared to CT scan in September 2024.  There was no obstruction, fluid collections, or free air.  She was started on TPN with tentative plan for OR in February 2025 pending improvement in her prealbumin.  Prealbumin did improve, though not optimal.  She was discharged on TPN in OR was postponed until 12/10/2023.  Did develop acute cystitis and completed antibiotics.  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  Tolerated procedure well.  01/04/2024 - Today she presents for follow-up.  She notes fatigue and intermittent chest tightness recently, denies any triggers,  says it feels like her arms/shoulders are tired.  She says the chest tightness occurs at random times and denies any alleviating or aggravating factors.  Only lasts a few seconds in duration.  Denies any radiation.  Does admit to more noticeable palpitations that occur about 3 times per day and has been more frequent recently. Palpitations are not long in duration, HR has not been elevated but feels as though it is elevated. Denies any shortness of breath, syncope, presyncope, dizziness, orthopnea, PND, swelling or significant weight changes, acute bleeding, or claudication.  02/29/2024 -presents today for follow-up.  Admits to labile sleep patterns.  Does admit to some palpitations at times. Denies any chest pain, shortness of breath, syncope, presyncope, dizziness, orthopnea, PND, swelling or significant weight changes, acute bleeding, or claudication.  She brings in her BP log from home that shows overall SBP averaging 140s to 150s with max of 163.   07/01/2024 - Here for follow-up. She is doing very well. Palpations have improved since last office visit. Denies any chest pain, shortness of breath, palpitations, syncope, presyncope, dizziness, orthopnea, PND, swelling or significant weight changes, acute bleeding, or claudication.  ROS: Negative. See HPI.   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   Studies Reviewed: SABRA    EKG:  EKG Interpretation Date/Time:  Tuesday July 01 2024 10:43:38 EDT Ventricular Rate:  66 PR Interval:  184 QRS Duration:  86 QT Interval:  414 QTC Calculation: 434 R Axis:   -52  Text Interpretation: Normal  sinus rhythm Left anterior fascicular block Moderate voltage criteria for LVH, may be normal variant ( R in aVL , Cornell product ) When compared with ECG of 15-Oct-2023 16:00, No significant change was found Confirmed by Miriam Norris [phone]) on 07/01/2024 10:52:44 AM   Echo 03/2024:  1. Left ventricular ejection fraction, by estimation, is 55 to 60%. Left   ventricular ejection fraction by 3D volume is 59 %. The left ventricle has  normal function. The left ventricle has no regional wall motion  abnormalities. There is mild concentric  left ventricular hypertrophy. Left ventricular diastolic parameters are  consistent with Grade I diastolic dysfunction (impaired relaxation). The  average left ventricular global longitudinal strain is -16.4 %. The global  longitudinal strain is normal.   2. Right ventricular systolic function is normal. The right ventricular  size is normal. There is normal pulmonary artery systolic pressure. The  estimated right ventricular systolic pressure is 34.8 mmHg.   3. Left atrial size was mildly dilated.   4. Right atrial size was mildly dilated.   5. The mitral valve is grossly normal. Mild mitral valve regurgitation.   6. The aortic valve is tricuspid. There is moderate calcification of the  aortic valve. Aortic valve regurgitation is mild. Aortic valve  sclerosis/calcification is present, without any evidence of aortic  stenosis. Aortic regurgitation PHT measures 626  msec. Aortic valve mean gradient measures 6.0 mmHg.   7. Aortic dilatation noted. There is mild dilatation of the ascending  aorta, measuring 39 mm.   8. The inferior vena cava is normal in size with greater than 50%  respiratory variability, suggesting right atrial pressure of 3 mmHg.   Comparison(s): A prior study was performed on 04/13/2023. Prior images  reviewed side by side. LVEF 55-60%. Mild mitral, aortic, and tricuspid  regurgitation. Mildly dilated ascending aorta.   Cardiac monitor 01/2024:  Predominant rhythm is sinus with heart rate ranging from 46 bpm up to 122 bpm and average heart rate 69 bpm. There were occasional PACs representing 1.4% total beats with otherwise rare atrial couplets and triplets. There were rare PVCs including ventricular couplets and triplets representing less than 1% total beats. Multiple (94) episodes of PSVT  were noted, the longest of which lasted for 20 minutes and 32 seconds with average heart rate 107 bpm.  Some correlation noted with patient triggered activity. No pauses or high degree heart block.  Cardiac monitor 07/2023: Predominant rhythm is sinus with heart rate ranging from 58 bpm up to 148 bpm and average heart rate 86 bpm. There were rare PACs including atrial couplets and triplets representing less than 1% total beats. There were rare PVCs including ventricular couplets and triplets representing less than 1% total beats. There were 2 episodes of NSVT, the longest of which was 7 beats. Multiple (39) episodes of PSVT were noted, the longest of which lasted 27 seconds with average heart rate in the 150s.  No atrial fibrillation noted. No pauses or high degree heart block.  Vascular ultrasound lower extremity venous bilateral (04/21/2023): Summary:  RIGHT:  - Findings consistent with acute deep vein thrombosis involving the right  peroneal veins.  - No cystic structure found in the popliteal fossa.    LEFT:  - There is no evidence of deep vein thrombosis in the lower extremity.    - No cystic structure found in the popliteal fossa.  Echo 03/2023: 1. Left ventricular ejection fraction, by estimation, is 60 to 65%. The  left ventricle has normal function.  The left ventricle has no regional  wall motion abnormalities. Left ventricular diastolic parameters were  normal.   2. Right ventricular systolic function is normal. The right ventricular  size is normal.   3. Left atrial size was mildly dilated.   4. The mitral valve is abnormal. Trivial mitral valve regurgitation. No  evidence of mitral stenosis.   5. The aortic valve is tricuspid. There is moderate calcification of the  aortic valve. There is moderate thickening of the aortic valve. Aortic  valve regurgitation is mild. Aortic valve sclerosis is present, with no  evidence of aortic valve stenosis.   6. The inferior vena cava  is normal in size with greater than 50%  respiratory variability, suggesting right atrial pressure of 3 mmHg.  Cardiac monitor 11/2022: Predominant rhythm is sinus with heart rate ranging from 53 bpm up to 145 bpm and average heart rate 77 bpm. There were rare PACs including atrial couplets and triplets representing less than 1% total beats. There were rare PVCs including ventricular couplets representing less than 1% total beats.  Also limited episodes of ventricular bigeminy and trigeminy. There were two brief episodes of SVT, the longest of which lasted for 7 beats.  Neither of these were patient triggered events. No sustained arrhythmias or pauses.  CCTA 11/2022: IMPRESSION: 1. Mild nonobstructive CAD, CADRADS = 2.   2. Coronary calcium  score of 122. This was 71st percentile for age and sex matched control.   3. Normal coronary origin with right dominance.   4.  Hiatal hernia present.   5.  Borderline dilation of ascending aorta at 39 mm.  Lexiscan  07/2018: There was no ST segment deviation noted during stress. The study is normal. There are no perfusion defects consistent with prior infarct or ischemia. The left ventricular ejection fraction is hyperdynamic (>65%). This is a low risk study.  Physical Exam:   VS:  BP 138/88 (BP Location: Left Arm)   Pulse 66   Ht 5' 3 (1.6 m)   Wt 144 lb 12.8 oz (65.7 kg)   SpO2 97%   BMI 25.65 kg/m    Wt Readings from Last 3 Encounters:  07/01/24 144 lb 12.8 oz (65.7 kg)  04/10/24 139 lb 9.6 oz (63.3 kg)  02/29/24 132 lb (59.9 kg)    GEN: Well nourished, well developed in no acute distress NECK: No JVD; No carotid bruits CARDIAC: S1/S2, RRR, grade 1/6 murmur, no rubs, no gallops RESPIRATORY:  Clear to auscultation without rales, wheezing or rhonchi  ABDOMEN: Soft, non-tender, non-distended EXTREMITIES:  no edema to BLE; No deformity   ASSESSMENT AND PLAN: .    Mild CAD Stable with no anginal symptoms. No indication for ischemic  evaluation. CCTA last year revealed coronary calcium  score of 122, mild/nonobstructive CAD. No indication for ischemic evaluation at this time. Continue current medication regimen. Care and ED precautions discussed.   Palpitations, PSVT Denies any recent palpitations or tachycardia. HR is well controlled. No medication changes at this time.  Heart healthy diet and regular cardiovascular exercise encouraged. Care and ED precautions discussed.  Hx of hypotension Denies any recent issues. Discussed to monitor BP at home at least 2 hours after medications and sitting for 5-10 minutes. Given BP log and salty six sheet today. Care and ED precautions discussed. Continue current medication regimen. Continue to follow with PCP.  Hx of PE/DVT  CT of abdomen 04/2023 revealed PE of right interlobar pulmonary artery and proximal and subsegmental pulmonary arteries. Etiology felt to be aggravated  by recent surgery and acute DVT noted along right peroneal veins. Completed Eliquis  and medication d/c by Hematology/Oncology. Continue to follow with PCP.  Aortic dilatation, valvular insufficiency Most recent echo showed stable borderline dilatation of ascending aorta at 39 mm. Care and ED precautions discussed. Continue to follow with PCP.  Most recent Echo showed mild AR and mild MR. Plan to update Echo in 3-5 years or sooner if anything changes.   Chronic mesenteric ischemia She is status post open resection of distal Roux limb. Anastomosed roux to BP more approximately.  Tolerated procedure well. Currently being followed by general surgery. Continue to follow-up with Care team.  Addendum 08/18/2024: 7. Pre-op clearance Ms. Tweten's perioperative risk of a major cardiac event is 0.4% according to the Revised Cardiac Risk Index (RCRI).  Therefore, she is at low risk for perioperative complications.   Her functional capacity is excellent at 6.05 METs according to the Duke Activity Status Index  (DASI). Recommendations: According to ACC/AHA guidelines, no further cardiovascular testing needed.  The patient may proceed to surgery at acceptable risk.   Antiplatelet and/or Anticoagulation Recommendations: She is not on any antiplatelet or anticoagulation medication that needs to be held prior to procedure.     Dispo: Follow-up with MD or APP in 6 months or sooner if anything changes.    Signed, Almarie Crate, NP

## 2024-07-16 ENCOUNTER — Other Ambulatory Visit: Payer: Self-pay | Admitting: Internal Medicine

## 2024-07-26 ENCOUNTER — Other Ambulatory Visit: Payer: Self-pay | Admitting: Nurse Practitioner

## 2024-08-05 ENCOUNTER — Other Ambulatory Visit: Payer: Self-pay | Admitting: Internal Medicine

## 2024-08-05 DIAGNOSIS — M51362 Other intervertebral disc degeneration, lumbar region with discogenic back pain and lower extremity pain: Secondary | ICD-10-CM

## 2024-08-12 ENCOUNTER — Encounter: Payer: Self-pay | Admitting: Internal Medicine

## 2024-08-12 ENCOUNTER — Ambulatory Visit (INDEPENDENT_AMBULATORY_CARE_PROVIDER_SITE_OTHER): Admitting: Internal Medicine

## 2024-08-12 VITALS — BP 136/70 | HR 75 | Ht 63.0 in | Wt 147.6 lb

## 2024-08-12 DIAGNOSIS — K439 Ventral hernia without obstruction or gangrene: Secondary | ICD-10-CM

## 2024-08-12 DIAGNOSIS — M51362 Other intervertebral disc degeneration, lumbar region with discogenic back pain and lower extremity pain: Secondary | ICD-10-CM | POA: Diagnosis not present

## 2024-08-12 DIAGNOSIS — I471 Supraventricular tachycardia, unspecified: Secondary | ICD-10-CM

## 2024-08-12 DIAGNOSIS — N816 Rectocele: Secondary | ICD-10-CM | POA: Insufficient documentation

## 2024-08-12 DIAGNOSIS — M2550 Pain in unspecified joint: Secondary | ICD-10-CM | POA: Insufficient documentation

## 2024-08-12 DIAGNOSIS — M810 Age-related osteoporosis without current pathological fracture: Secondary | ICD-10-CM

## 2024-08-12 DIAGNOSIS — K219 Gastro-esophageal reflux disease without esophagitis: Secondary | ICD-10-CM

## 2024-08-12 DIAGNOSIS — E039 Hypothyroidism, unspecified: Secondary | ICD-10-CM | POA: Diagnosis not present

## 2024-08-12 MED ORDER — PANTOPRAZOLE SODIUM 40 MG PO TBEC: 40.0000 mg | DELAYED_RELEASE_TABLET | Freq: Two times a day (BID) | ORAL | 5 refills | Status: AC

## 2024-08-12 MED ORDER — PREDNISONE 20 MG PO TABS: 40.0000 mg | ORAL_TABLET | Freq: Every day | ORAL | 0 refills | Status: DC

## 2024-08-12 MED ORDER — MELOXICAM 7.5 MG PO TABS: 7.5000 mg | ORAL_TABLET | Freq: Every day | ORAL | 3 refills | Status: AC

## 2024-08-12 MED ORDER — LEVOTHYROXINE SODIUM 137 MCG PO TABS: 137.0000 ug | ORAL_TABLET | Freq: Every day | ORAL | 3 refills | Status: AC

## 2024-08-12 NOTE — Assessment & Plan Note (Addendum)
 Lab Results  Component Value Date   TSH 3.710 04/10/2024   On levothyroxine  137 mcg QD Check TSH and free T4 - has diffuse myalgias currently

## 2024-08-12 NOTE — Progress Notes (Signed)
 Established Patient Office Visit  Subjective:  Patient ID: Kayla Dean, female    DOB: [birth year]  Age: 73 y.o. MRN: [id number]  CC:  Chief Complaint  Patient presents with   Hypertension    4 month f/u    Hypothyroidism    4 month f/u    Hernia    Ongoing for 1 month , abdominal area.     HPI Kayla Dean is a 73 y.o. female with past medical history of PSVT, GERD and hypothyroidism who presents for f/u of her chronic medical conditions.  PSVT: Followed by cardiology.  On metoprolol  25 mg BID.  She still has palpitations at times.  Denies any chest pain or dyspnea currently.  Hypothyroidism: She takes levothyroxine  137 mcg daily.  Denies any recent change in appetite.  She reports diffuse muscle aches.  GERD: She takes pantoprazole  40 mg BID.  She still reports acid reflux, especially in the morning.  Denies dysphagia or odynophagia.  She reports chronic low back pain, worse in the morning, intermittent, radiating to bilateral LE.  She has tried taking Tylenol  arthritis without much relief.  She also reports diffuse muscle aches and arthralgias.  Of note, she has insomnia, difficulty initiating and maintaining sleep.  Denies anhedonia, SI or HI currently.  She was given gabapentin  in the previous visit, but has not noticed much improvement.  She also reports noticing a bulge near umbilicus, which is slightly tender at times.  She has history of incarcerated ventral hernia, s/p repair in [place].  She was evaluated by Dr. Kallie in 11/24.  Denies any nausea, vomiting or changes in bowel habits recently.  Past Medical History:  Diagnosis Date   Anemia    Aortic aneurysm    Arthritis    C. difficile colitis 07/20/2023   GERD (gastroesophageal reflux disease)    H/O gastric bypass    Hiatal hernia    Hypothyroidism    Osteoporosis    Shock liver 02/03/2023   Small bowel obstruction [facility])     Past Surgical History:  Procedure Laterality Date   ABDOMINAL HYSTERECTOMY      BIOPSY  08/28/2023   Procedure: BIOPSY;  Surgeon: Cindie Carlin POUR, DO;  [facility]: [facility];  Service: Endoscopy;;   CATARACT EXTRACTION Bilateral    CHOLECYSTECTOMY     COLON SURGERY     ENTEROSCOPY N/A 08/28/2023   Procedure: ENTEROSCOPY;  Surgeon: Cindie Carlin POUR, DO;  Location: [facility];  Service: Endoscopy;  Laterality: N/A;  1015am, asa 3   ESOPHAGOGASTRODUODENOSCOPY (EGD) WITH PROPOFOL  N/A 08/28/2023   Procedure: ESOPHAGOGASTRODUODENOSCOPY (EGD) WITH PROPOFOL ;  Surgeon: Cindie Carlin POUR, DO;  Location: [facility];  Service: Endoscopy;  Laterality: N/A;   EYE SURGERY     Cataract   FRACTURE SURGERY     GASTRIC BYPASS     HERNIA REPAIR     hiatal hernia   JOINT REPLACEMENT     LAPAROTOMY N/A 02/02/2023   Procedure: EXPLORATORY LAPAROTOMY, [name], EXPLANT OF ABDOMINAL MESH;  Surgeon: Kallie Manuelita BROCKS, MD;  Location: [facility];  Service: General;  Laterality: N/A;   REPLACEMENT TOTAL KNEE Right    ROTATOR CUFF REPAIR Right    SMALL INTESTINE SURGERY  02/02/2023   TOTAL HIP ARTHROPLASTY Right     Family History  Problem Relation Age of Onset   CAD Father    Heart disease Father    CAD Brother    Heart disease Brother  Social History   Socioeconomic History   Marital status: Widowed    Spouse name: Not on file   Number of children: Not on file   Years of education: Not on file   Highest education level: Associate degree: occupational, scientist, product/process development, or vocational program  Occupational History   Not on file  Tobacco Use   Smoking status: Never   Smokeless tobacco: Never  Vaping Use   Vaping status: Never Used  Substance and Sexual Activity   Alcohol use: Never   Drug use: Never   Sexual activity: Not Currently    Birth control/protection: Abstinence, None  Other Topics Concern   Not on file  Social History Narrative   Not on file   Social Drivers of Health   Financial Resource Strain: Low Risk  (08/08/2024)    Overall Financial Resource Strain (CARDIA)    Difficulty of Paying Living Expenses: Not hard at all  Food Insecurity: No Food Insecurity (08/08/2024)   Hunger Vital Sign    Worried About Running Out of Food in the Last Year: Never true    Ran Out of Food in the Last Year: Never true  Transportation Needs: No Transportation Needs (08/08/2024)   PRAPARE - Administrator, Civil Service (Medical): No    Lack of Transportation (Non-Medical): No  Physical Activity: Insufficiently Active (08/08/2024)   Exercise Vital Sign    Days of Exercise per Week: 2 days    Minutes of Exercise per Session: 30 min  Stress: Stress Concern Present (08/08/2024)   [employer] of Occupational Health - Occupational Stress Questionnaire    Feeling of Stress: To some extent  Social Connections: Moderately Integrated (08/08/2024)   Social Connection and Isolation Panel    Frequency of Communication with Friends and Family: More than three times a week    Frequency of Social Gatherings with Friends and Family: Once a week    Attends Religious Services: More than 4 times per year    Active Member of [employer] or Organizations: Yes    Attends Banker Meetings: More than 4 times per year    Marital Status: Widowed  Intimate Partner Violence: Not At Risk (10/24/2023)   Humiliation, Afraid, Rape, and Kick questionnaire    Fear of Current or Ex-Partner: No    Emotionally Abused: No    Physically Abused: No    Sexually Abused: No    Outpatient Medications Prior to Visit  Medication Sig Dispense Refill   acetaminophen  (TYLENOL ) 500 MG tablet Take 500 mg by mouth every 6 (six) hours as needed.     alendronate  (FOSAMAX ) 70 MG tablet Take 1 tablet (70 mg total) by mouth every 7 (seven) days. Take with a full glass of water on an empty stomach. 4 tablet 11   ascorbic acid (VITAMIN C) 500 MG tablet Take 500 mg by mouth daily.     BIOTIN 5000 PO Take by mouth.     Calcium  Carb-Cholecalciferol  (CALCIUM  600 + D) 600-200 MG-UNIT TABS Take 1 tablet by mouth every morning.     Cholecalciferol (VITAMIN D -3 PO) Take 1 capsule by mouth daily.     cyclobenzaprine  (FLEXERIL ) 5 MG tablet TAKE ONE TABLET BY MOUTH AT BEDTIME 30 tablet 1   Elastic Bandages & Supports (ABDOMINAL BINDER/ELASTIC MED) MISC Apply as directed for orthostatic hypotension 1 each 1   FEROSUL 325 (65 Fe) MG tablet Take 325 mg by mouth every morning.     fludrocortisone  (FLORINEF ) 0.1 MG tablet TAKE  ONE TABLET BY MOUTH EVERY DAY 30 tablet 11   furosemide  (LASIX ) 20 MG tablet Take 1 tablet (20 mg total) by mouth daily as needed for fluid or edema. 90 tablet 0   gabapentin  (NEURONTIN ) 300 MG capsule Take 1 capsule (300 mg total) by mouth at bedtime. 90 capsule 1   Lactobacillus (PROBIOTIC ACIDOPHILUS) CAPS Take 1 capsule by mouth 2 (two) times daily.     metoprolol  tartrate (LOPRESSOR ) 25 MG tablet Take 1 tablet (25 mg total) by mouth 2 (two) times daily. 180 tablet 3   Multiple Vitamin (MULTIVITAMIN WITH MINERALS) TABS tablet Take 1 tablet by mouth every morning.     ondansetron  (ZOFRAN -ODT) 4 MG disintegrating tablet Take 1 tablet (4 mg total) by mouth every 6 (six) hours as needed for nausea. 20 tablet 0   potassium chloride  SA (KLOR-CON  M) 20 MEQ tablet Take 2 tablets (40 mEq total) by mouth daily as needed (When taking Lasix ). 180 tablet 0   saccharomyces boulardii (FLORASTOR) 250 MG capsule Take 1 capsule (250 mg total) by mouth 2 (two) times daily. 60 capsule 0   sucralfate  (CARAFATE ) 1 g tablet TAKE ONE TABLET BY MOUTH FOUR TIMES DAILY WITH MEALS AND AT BEDTIME. 120 tablet 2   zolpidem  (AMBIEN ) 5 MG tablet Take 1 tablet (5 mg total) by mouth at bedtime as needed for sleep. 30 tablet 0   levothyroxine  (SYNTHROID ) 137 MCG tablet TAKE ONE TABLET BY MOUTH ONCE DAILY BEFORE BREAKFAST 90 tablet 0   pantoprazole  (PROTONIX ) 40 MG tablet TAKE ONE TABLET BY MOUTH TWICE DAILY 60 tablet 2   No facility-administered medications prior  to visit.    No Known Allergies  ROS Review of Systems  Constitutional:  Positive for fatigue. Negative for chills and fever.  HENT:  Negative for congestion, sinus pressure, sinus pain and sore throat.   Eyes:  Negative for pain and discharge.  Respiratory:  Negative for cough and shortness of breath.   Cardiovascular:  Positive for leg swelling. Negative for chest pain and palpitations.  Gastrointestinal:  Negative for abdominal pain, diarrhea, nausea and vomiting.  Endocrine: Negative for polydipsia and polyuria.  Genitourinary:  Negative for dysuria and hematuria.  Musculoskeletal:  Positive for arthralgias, back pain and myalgias. Negative for neck pain and neck stiffness.  Skin:  Negative for rash.  Neurological:  Positive for weakness. Negative for dizziness.  Psychiatric/Behavioral:  Negative for agitation and behavioral problems.       Objective:    Physical Exam Vitals reviewed.  Constitutional:      General: She is not in acute distress.    Appearance: She is obese. She is not diaphoretic.  HENT:     Head: Normocephalic and atraumatic.     Nose: Nose normal. No congestion.     Mouth/Throat:     Mouth: Mucous membranes are moist.     Pharynx: No posterior oropharyngeal erythema.  Eyes:     General: No scleral icterus.    Extraocular Movements: Extraocular movements intact.  Cardiovascular:     Rate and Rhythm: Normal rate and regular rhythm.     Heart sounds: Normal heart sounds. No murmur heard. Pulmonary:     Breath sounds: Normal breath sounds. No wheezing or rales.  Abdominal:     Palpations: Abdomen is soft.     Tenderness: There is no abdominal tenderness.     Hernia: A hernia (Ventral) is present.  Musculoskeletal:     Cervical back: Neck supple. No tenderness.  Lumbar back: Tenderness present. Decreased range of motion.     Left knee: Swelling present. Tenderness present.     Right lower leg: No edema.     Left lower leg: No edema.  Skin:     General: Skin is warm.     Findings: No rash.  Neurological:     General: No focal deficit present.     Mental Status: She is alert and oriented to person, place, and time.  Psychiatric:        Mood and Affect: Mood normal.        Behavior: Behavior normal.     BP 136/70   Pulse 75   Ht 5' 3 (1.6 m)   Wt 147 lb 9.6 oz (67 kg)   SpO2 96%   BMI 26.15 kg/m  Wt Readings from Last 3 Encounters:  08/12/24 147 lb 9.6 oz (67 kg)  07/01/24 144 lb 12.8 oz (65.7 kg)  04/10/24 139 lb 9.6 oz (63.3 kg)    Lab Results  Component Value Date   TSH 3.710 04/10/2024   Lab Results  Component Value Date   WBC 7.1 04/10/2024   HGB 12.9 04/10/2024   HCT 40.5 04/10/2024   MCV 94 04/10/2024   PLT 196 04/10/2024   Lab Results  Component Value Date   NA 144 04/10/2024   K 3.7 04/10/2024   CO2 24 04/10/2024   GLUCOSE 87 04/10/2024   BUN 10 04/10/2024   CREATININE 0.60 04/10/2024   BILITOT 0.4 04/10/2024   ALKPHOS 109 04/10/2024   AST 24 04/10/2024   ALT 19 04/10/2024   PROT 6.6 04/10/2024   ALBUMIN  4.0 04/10/2024   CALCIUM  8.8 04/10/2024   ANIONGAP 6 09/04/2023   EGFR 95 04/10/2024   Lab Results  Component Value Date   CHOL 147 01/04/2024   Lab Results  Component Value Date   HDL 56 01/04/2024   Lab Results  Component Value Date   LDLCALC 75 01/04/2024   Lab Results  Component Value Date   TRIG 85 01/04/2024   Lab Results  Component Value Date   CHOLHDL 2.6 01/04/2024   Lab Results  Component Value Date   HGBA1C 4.9 01/04/2024      Assessment & Plan:   Problem List Items Addressed This Visit       Cardiovascular and Mediastinum   PSVT (paroxysmal supraventricular tachycardia)   Followed by cardiology On metoprolol  25 mg BID        Digestive   Gastroesophageal reflux disease   On pantoprazole  40 mg BID - advised to try once daily dosing instead Has sucralfate  as needed for persistent acid reflux Advised to avoid hot and spicy food If persistent,  will need GI evaluation      Relevant Medications   pantoprazole  (PROTONIX ) 40 MG tablet   Other Relevant Orders   CMP14+EGFR   Rectocele   Planned to get evaluation by [facility] - Dr. Sudie        Endocrine   Acquired hypothyroidism   Lab Results  Component Value Date   TSH 3.710 04/10/2024   On levothyroxine  137 mcg QD Check TSH and free T4 - has diffuse myalgias currently      Relevant Medications   levothyroxine  (SYNTHROID ) 137 MCG tablet   Other Relevant Orders   CMP14+EGFR   TSH + free T4     Musculoskeletal and Integument   Degeneration of intervertebral disc of lumbar region with discogenic back pain and lower extremity pain -  Primary   Chronic low back pain, worse for the last 1 week, with muscle stiffness and radicular symptoms to bilateral LE Checked x-ray of lumbar spine On gabapentin  300 mg nightly Oral prednisone 40 mg QD x 5 days Meloxicam 7.5 mg QD as needed for pain, advised to alternate with Tylenol  arthritis Flexeril  as needed for muscle spasms Avoid heavy lifting and frequent bending Simple back exercises material provided, referred to PT in [place] ([facility] as per patient preference)      Relevant Medications   predniSONE (DELTASONE) 20 MG tablet   meloxicam (MOBIC) 7.5 MG tablet   Other Relevant Orders   Ambulatory referral to Physical Therapy   Age-related osteoporosis without current pathological fracture   Last DEXA scan reviewed Started alendronate  (07/25) Continue vitamin D  supplement        Other   Ventral hernia without obstruction or gangrene   Has a history of incarcerated ventral hernia, s/p repair Advised to contact general surgery clinic for follow-up      Polyarthralgia   Due to diffuse arthralgias, including bilateral shoulders, elbow, wrist and knee in addition to low back pain, will check ANA, CRP and RF to rule out autoimmune etiology Meloxicam as needed for pain Referred to PT      Relevant Orders    ANA w/Reflex   C-reactive protein   Rheumatoid Factor     Meds ordered this encounter  Medications   predniSONE (DELTASONE) 20 MG tablet    Sig: Take 2 tablets (40 mg total) by mouth daily with breakfast.    Dispense:  10 tablet    Refill:  0   meloxicam (MOBIC) 7.5 MG tablet    Sig: Take 1 tablet (7.5 mg total) by mouth daily.    Dispense:  30 tablet    Refill:  3   levothyroxine  (SYNTHROID ) 137 MCG tablet    Sig: Take 1 tablet (137 mcg total) by mouth daily before breakfast.    Dispense:  90 tablet    Refill:  3   pantoprazole  (PROTONIX ) 40 MG tablet    Sig: Take 1 tablet (40 mg total) by mouth 2 (two) times daily.    Dispense:  60 tablet    Refill:  5    Follow-up: Return in about 4 months (around 12/13/2024) for HTN and back pain.    Suzzane MARLA Blanch, MD

## 2024-08-12 NOTE — Assessment & Plan Note (Addendum)
 Last DEXA scan reviewed Started alendronate  (07/25) Continue vitamin D  supplement

## 2024-08-12 NOTE — Assessment & Plan Note (Signed)
 Due to diffuse arthralgias, including bilateral shoulders, elbow, wrist and knee in addition to low back pain, will check ANA, CRP and RF to rule out autoimmune etiology Meloxicam as needed for pain Referred to PT

## 2024-08-12 NOTE — Patient Instructions (Signed)
 Please take Prednisone as prescribed for 5 days. Please take Meloxicam as needed for back pain after completing Prednisone. Okay to alternate with Tylenol  arthritis.  Please continue to take other medications as prescribed.  Please continue to follow low salt diet and perform moderate exercise/walking as tolerated.

## 2024-08-12 NOTE — Assessment & Plan Note (Signed)
 On pantoprazole  40 mg BID - advised to try once daily dosing instead Has sucralfate  as needed for persistent acid reflux Advised to avoid hot and spicy food If persistent, will need GI evaluation

## 2024-08-12 NOTE — Assessment & Plan Note (Signed)
 Planned to get evaluation by [facility] - Dr. Sudie

## 2024-08-12 NOTE — Assessment & Plan Note (Addendum)
 Chronic low back pain, worse for the last 1 week, with muscle stiffness and radicular symptoms to bilateral LE Checked x-ray of lumbar spine On gabapentin  300 mg nightly Oral prednisone 40 mg QD x 5 days Meloxicam 7.5 mg QD as needed for pain, advised to alternate with Tylenol  arthritis Flexeril  as needed for muscle spasms Avoid heavy lifting and frequent bending Simple back exercises material provided, referred to PT in [place] ([facility] as per patient preference)

## 2024-08-12 NOTE — Assessment & Plan Note (Signed)
 Has a history of incarcerated ventral hernia, s/p repair Advised to contact general surgery clinic for follow-up

## 2024-08-12 NOTE — Assessment & Plan Note (Signed)
 Followed by cardiology On metoprolol  25 mg BID

## 2024-08-14 ENCOUNTER — Telehealth: Payer: Self-pay

## 2024-08-14 ENCOUNTER — Other Ambulatory Visit: Payer: Self-pay | Admitting: Internal Medicine

## 2024-08-14 ENCOUNTER — Ambulatory Visit: Payer: Self-pay | Admitting: Internal Medicine

## 2024-08-14 DIAGNOSIS — D8989 Other specified disorders involving the immune mechanism, not elsewhere classified: Secondary | ICD-10-CM

## 2024-08-14 DIAGNOSIS — R7689 Other specified abnormal immunological findings in serum: Secondary | ICD-10-CM | POA: Insufficient documentation

## 2024-08-14 LAB — CMP14+EGFR
ALT: 20 IU/L (ref 0–32)
AST: 21 IU/L (ref 0–40)
Albumin: 4.4 g/dL (ref 3.8–4.8)
Alkaline Phosphatase: 76 IU/L (ref 49–135)
BUN/Creatinine Ratio: 18 (ref 12–28)
BUN: 11 mg/dL (ref 8–27)
Bilirubin Total: 0.5 mg/dL (ref 0.0–1.2)
CO2: 25 mmol/L (ref 20–29)
Calcium: 9.1 mg/dL (ref 8.7–10.3)
Chloride: 100 mmol/L (ref 96–106)
Creatinine, Ser: 0.62 mg/dL (ref 0.57–1.00)
Globulin, Total: 2.7 g/dL (ref 1.5–4.5)
Glucose: 90 mg/dL (ref 70–99)
Potassium: 4.7 mmol/L (ref 3.5–5.2)
Sodium: 139 mmol/L (ref 134–144)
Total Protein: 7.1 g/dL (ref 6.0–8.5)
eGFR: 93 mL/min/1.73 (ref >59)

## 2024-08-14 LAB — ENA+DNA/DS+SJORGEN'S
ENA RNP Ab: 0.4 AI (ref 0.0–0.9)
ENA SM Ab Ser-aCnc: <0.2 AI (ref 0.0–0.9)
ENA SSA (RO) Ab: 5 AI — ABNORMAL HIGH (ref 0.0–0.9)
ENA SSB (LA) Ab: <0.2 AI (ref 0.0–0.9)
dsDNA Ab: 1 [IU]/mL (ref 0–9)

## 2024-08-14 LAB — C-REACTIVE PROTEIN: CRP: 1 mg/L (ref 0–10)

## 2024-08-14 LAB — ANA W/REFLEX: Anti Nuclear Antibody (ANA): POSITIVE — AB

## 2024-08-14 LAB — TSH+FREE T4
Free T4: 1.9 ng/dL — ABNORMAL HIGH (ref 0.82–1.77)
TSH: 2.99 u[IU]/mL (ref 0.450–4.500)

## 2024-08-14 LAB — RHEUMATOID FACTOR: Rheumatoid fact SerPl-aCnc: 10.4 [IU]/mL (ref <14.0)

## 2024-08-14 NOTE — Telephone Encounter (Signed)
 Copied from CRM [id number]. Topic: General - Call Back - No Documentation >> Aug 14, 2024  1:40 PM Thliyah D wrote: Pt returning call to [facility]

## 2024-08-18 ENCOUNTER — Encounter: Payer: Self-pay | Admitting: Radiology

## 2024-08-19 ENCOUNTER — Telehealth (HOSPITAL_BASED_OUTPATIENT_CLINIC_OR_DEPARTMENT_OTHER): Payer: Self-pay

## 2024-08-19 NOTE — Telephone Encounter (Signed)
 Received PreOp clearance form [employer] today and called their office at 3:17 pm to clarify information about the procedure and what type of anesthesia would be used. I spoke with Chiquita Fair who stated that the patient was cleared this morning by Almarie Crate, NP and she has already scheduled the patient's procedure. I could not find the clearance in EPIC.            Pre-operative Risk Assessment    Patient Name: Kayla Dean  DOB: 03/09/1951 MRN: [id number]   Date of last office visit: 07/01/2024 - Almarie Crate, NP Date of next office visit: 09/19/2024 - Dr. Nancey   Request for Surgical Clearance    Procedure:  Posterior repair, possible anterior repairfor vaginal prolapse  Date of Surgery:  Clearance TBD                                 Surgeon:  Dr. Lavonia Guppy  Surgeon's Group or Practice Name:  [facility] Phone number:  [phone] Fax number:  [phone] - Attention: Chiquita Fair   Type of Clearance Requested:   - Medical    Type of Anesthesia:  not specified   Additional requests/questions:  N/A  SignedPatrcia Iverson LITTIE   08/19/2024, 2:56 PM

## 2024-08-28 ENCOUNTER — Telehealth: Payer: Self-pay | Admitting: Internal Medicine

## 2024-08-28 NOTE — Telephone Encounter (Signed)
 Kayla Dean, this lady had left a message but it was saved on the voice mail and I don't know when she called.  You may have already taken care of this I don't know.  Her message said that Dr. Cindie referred her to surgery and she did have surgery in Feb. But now she thinks she has an incisional hernia.  Does she need to go back to the surgeon for this?  I think yes but I wanted to pass along to you to make sure.

## 2024-08-28 NOTE — Telephone Encounter (Signed)
 Will address when Dr Cindie is back next week

## 2024-08-29 ENCOUNTER — Telehealth: Payer: Self-pay

## 2024-08-29 ENCOUNTER — Telehealth: Payer: Self-pay | Admitting: *Deleted

## 2024-08-29 NOTE — Telephone Encounter (Signed)
 Received call from patient [phone]~ telephone.   Patient reports umbilical hernia noted by PCP who recommended that she follow up with [facility] for evaluation and treatment.   Noted that patient has had multiple abdominal surgeries, and was last advised to follow up with Dr. Georgian at [facility] in 2024.  Please advise.

## 2024-08-29 NOTE — Telephone Encounter (Signed)
 Copied from CRM #[id number]. Topic: Referral - Question >> Aug 29, 2024 11:18 AM Joesph NOVAK wrote: Reason for CRM: Patient called to make a appt with [facility] for rheumatology and the office said they do not have a referral for her. Patient would like for someone to give her a call in regards to this situation.

## 2024-08-29 NOTE — Telephone Encounter (Signed)
 Copied from CRM #[id number]. Topic: Referral - Question >> Aug 29, 2024 11:22 AM Joesph NOVAK wrote: Reason for CRM: Patient has a physical therapy referral and she would like for it to be faxed to [facility] in [place].  FAX: [phone] PH: [phone]

## 2024-09-01 NOTE — Telephone Encounter (Signed)
 Sent a message to Dr Cindie in secure chat. Waiting on a response

## 2024-09-01 NOTE — Telephone Encounter (Signed)
 Referral has been sent to [facility] in [place] as Patient requested at the fax number provided by Patient.

## 2024-09-01 NOTE — Telephone Encounter (Signed)
 Referral sent to: [facility] [address] - [place] [address] [phone]  [place] patient requested.  Once their Office reviews the referral, their Office will contact the Patient in regards to Appointment scheduling.

## 2024-09-01 NOTE — Telephone Encounter (Signed)
 Discussed with Dr. Kallie.   Patient may require further imaging and depending on the size of the hernia, may be referred to hernia specialists.   Patient aware and requested to have Dr Kallie begin evaluation.   Appointment scheduled.

## 2024-09-03 NOTE — Telephone Encounter (Signed)
Phoned the pt and LMOVM for the pt to return call 

## 2024-09-03 NOTE — Telephone Encounter (Signed)
 Message from Dr Cindie in secure chat: yes would recommend she go back to surgeon who performed the surgery, thank you

## 2024-09-05 NOTE — Telephone Encounter (Signed)
 Letter mailed to the pt.

## 2024-09-16 ENCOUNTER — Other Ambulatory Visit: Payer: Self-pay | Admitting: Internal Medicine

## 2024-09-16 DIAGNOSIS — E876 Hypokalemia: Secondary | ICD-10-CM

## 2024-09-18 ENCOUNTER — Ambulatory Visit: Admitting: General Surgery

## 2024-09-18 ENCOUNTER — Encounter: Payer: Self-pay | Admitting: General Surgery

## 2024-09-18 VITALS — BP 137/85 | HR 81 | Temp 98.2°F | Resp 16 | Ht 63.0 in | Wt 150.0 lb

## 2024-09-18 DIAGNOSIS — K439 Ventral hernia without obstruction or gangrene: Secondary | ICD-10-CM | POA: Diagnosis not present

## 2024-09-18 NOTE — Patient Instructions (Signed)
 Will get a CT scan to look at this further and will plan to see you after the CT to discuss option.

## 2024-09-18 NOTE — Progress Notes (Signed)
 [facility] History and Physical  Reason for Referral: Ventral hernia  Referring Physician: Self   Chief Complaint   Follow-up     Kayla Dean is a 73 y.o. female.  HPI:   Patient known to me with a complicated history in 2024 where she had a SBO from an incarcerated ventral hernia where I reduced the bowel, explanted a part of her mesh, and was able to salvage the bowel. Unfortunately, the bowel had an ischemic event and instead of continuing to heal it stenosed after the surgery. She had a long segment that stenosed. This was complicated by her history of open gastric bypass at [facility]. Given this, I felt she need to go back to bariatric surgeon for excision of the SB and possible revision of her gastric bypass if needed. During this time she experienced issues with eating, and low albumin  and developed a DVT that required anticoagulation.  She completed this surgery in early 2025 after receiving TPN nutrition as she was malnourished during the time she had the stenosed bowel. She is doing well now and is gaining weight. She has noticed a ventral hernia. She is off blood thinner.   Past Medical History:  Diagnosis Date   Anemia    Aortic aneurysm    Arthritis    C. difficile colitis 07/20/2023   GERD (gastroesophageal reflux disease)    H/O gastric bypass    Hiatal hernia    Hypothyroidism    Osteoporosis    Shock liver 02/03/2023   Small bowel obstruction [facility])     Past Surgical History:  Procedure Laterality Date   ABDOMINAL HYSTERECTOMY     BIOPSY  08/28/2023   Procedure: BIOPSY;  Surgeon: Cindie Carlin POUR, DO;  Location: [facility];  Service: Endoscopy;;   CATARACT EXTRACTION Bilateral    CHOLECYSTECTOMY     COLON SURGERY     ENTEROSCOPY N/A 08/28/2023   Procedure: ENTEROSCOPY;  Surgeon: Cindie Carlin POUR, DO;  [facility]: [facility];  Service: Endoscopy;  Laterality: N/A;  1015am, asa 3   ESOPHAGOGASTRODUODENOSCOPY (EGD) WITH PROPOFOL  N/A  08/28/2023   Procedure: ESOPHAGOGASTRODUODENOSCOPY (EGD) WITH PROPOFOL ;  Surgeon: Cindie Carlin POUR, DO;  [facility];  Service: Endoscopy;  Laterality: N/A;   EYE SURGERY     Cataract   FRACTURE SURGERY     GASTRIC BYPASS     HERNIA REPAIR     hiatal hernia   JOINT REPLACEMENT     LAPAROTOMY N/A 02/02/2023   Procedure: EXPLORATORY LAPAROTOMY, [name], EXPLANT OF ABDOMINAL MESH;  Surgeon: Kallie Manuelita BROCKS, MD;  Location: [facility] ORS;  Service: General;  Laterality: N/A;   REPLACEMENT TOTAL KNEE Right    ROTATOR CUFF REPAIR Right    SMALL INTESTINE SURGERY  02/02/2023   TOTAL HIP ARTHROPLASTY Right     Family History  Problem Relation Age of Onset   CAD Father    Heart disease Father    CAD Brother    Heart disease Brother     Social History   Tobacco Use   Smoking status: Never   Smokeless tobacco: Never  Vaping Use   Vaping status: Never Used  Substance Use Topics   Alcohol use: Never   Drug use: Never    Medications: I have reviewed the patient's current medications. Allergies as of 09/18/2024   No Known Allergies      Medication List        Accurate as of September 18, 2024 11:59 PM. If you have any questions, ask your nurse or doctor.          STOP taking these medications    predniSONE  20 MG tablet Commonly known as: DELTASONE  Stopped by: Manuelita JAYSON Pander       TAKE these medications    Abdominal Binder/Elastic Med Misc Apply as directed for orthostatic hypotension   acetaminophen  500 MG tablet Commonly known as: TYLENOL  Take 500 mg by mouth every 6 (six) hours as needed.   alendronate  70 MG tablet Commonly known as: FOSAMAX  Take 1 tablet (70 mg total) by mouth every 7 (seven) days. Take with a full glass of water on an empty stomach.   ascorbic acid 500 MG tablet Commonly known as: VITAMIN C Take 500 mg by mouth daily.   BIOTIN 5000 PO Take by mouth.   Calcium  600 + D 600-200 MG-UNIT Tabs Generic  drug: Calcium  Carb-Cholecalciferol Take 1 tablet by mouth every morning.   cyclobenzaprine  5 MG tablet Commonly known as: FLEXERIL  TAKE ONE TABLET BY MOUTH AT BEDTIME   FeroSul 325 (65 Fe) MG tablet Generic drug: ferrous sulfate Take 325 mg by mouth every morning.   fludrocortisone  0.1 MG tablet Commonly known as: FLORINEF  TAKE ONE TABLET BY MOUTH EVERY DAY   furosemide  20 MG tablet Commonly known as: LASIX  Take 1 tablet (20 mg total) by mouth daily as needed for fluid or edema.   gabapentin  300 MG capsule Commonly known as: NEURONTIN  Take 1 capsule (300 mg total) by mouth at bedtime.   levothyroxine  137 MCG tablet Commonly known as: SYNTHROID  Take 1 tablet (137 mcg total) by mouth daily before breakfast.   meloxicam  7.5 MG tablet Commonly known as: MOBIC  Take 1 tablet (7.5 mg total) by mouth daily.   metoprolol  tartrate 25 MG tablet Commonly known as: LOPRESSOR  Take 1 tablet (25 mg total) by mouth 2 (two) times daily.   multivitamin with minerals Tabs tablet Take 1 tablet by mouth every morning.   ondansetron  4 MG disintegrating tablet Commonly known as: ZOFRAN -ODT Take 1 tablet (4 mg total) by mouth every 6 (six) hours as needed for nausea.   pantoprazole  40 MG tablet Commonly known as: PROTONIX  Take 1 tablet (40 mg total) by mouth 2 (two) times daily.   potassium chloride  SA 20 MEQ tablet Commonly known as: KLOR-CON  M TAKE TWO TABLETS BY MOUTH ONCE DAILY AS NEEDED (WHEN TAKING FUROSEMIDE )   Probiotic Acidophilus Caps Take 1 capsule by mouth 2 (two) times daily.   saccharomyces boulardii 250 MG capsule Commonly known as: FLORASTOR Take 1 capsule (250 mg total) by mouth 2 (two) times daily.   sucralfate  1 g tablet Commonly known as: CARAFATE  TAKE ONE TABLET BY MOUTH FOUR TIMES DAILY WITH MEALS AND AT BEDTIME.   VITAMIN D -3 PO Take 1 capsule by mouth daily.   zolpidem  5 MG tablet Commonly known as: Ambien  Take 1 tablet (5 mg total) by mouth at bedtime  as needed for sleep.         ROS:  A comprehensive review of systems was negative except for: Gastrointestinal: positive for abdominal pain and hernia  Blood pressure 137/85, pulse 81, temperature 98.2 F (36.8 C), temperature source Oral, resp. rate 16, height 5' 3 (1.6 m), weight 150 lb (68 kg), SpO2 95%. Physical Exam Vitals reviewed.  HENT:     Head: Normocephalic.     Nose: Nose normal.  Eyes:     Pupils: Pupils are equal, round, and reactive to light.  Cardiovascular:     Rate and Rhythm: Normal rate and regular rhythm.  Pulmonary:     Effort: Pulmonary effort is normal.  Abdominal:     General: There is no distension.     Palpations: Abdomen is soft.     Tenderness: There is no abdominal tenderness.     Hernia: A hernia is present.     Comments: 2cm defect in the lower left abdomen, just below and left of umbilicus reducible   Musculoskeletal:        General: Normal range of motion.     Comments: Moves all extremities   Skin:    General: Skin is warm.  Neurological:     General: No focal deficit present.     Mental Status: She is alert and oriented to person, place, and time.  Psychiatric:        Mood and Affect: Mood normal.     Results: No imaging since January 2025 prior to her surgery at [facility]. No imaging in our system since September 2024.    Assessment and Plan:   Kayla Dean is a 73 y.o. female with a ventral hernia that is small in the setting of a complicated past 18 months with multiple surgeries. She is wanting to get the hernia fixed in the new year. I discussed that I do not feel any other defects but a CT would allow us  to tell if other defects exist. We can then decide if an open hernia repair with mesh is better or if we will opt for a robotic assisted laparoscopic repair with mesh.   Will get a CT scan to look at this further and will plan to see you after the CT to discuss option.  All questions were answered to the satisfaction of the  patient.  Future Appointments  Date Time Provider Department Center  10/21/2024  1:30 PM [facility] 1 [facility] [place] [facility]  10/27/2024  3:50 PM [facility]-ANNUAL WELLNESS VISIT [facility] [address]  12/09/2024 10:20 AM Tobie Suzzane POUR, MD [facility] [address]  12/30/2024 11:20 AM Debera Jayson MATSU, MD [facility]      Manuelita JAYSON Pander 09/21/2024, 6:50 PM

## 2024-09-19 ENCOUNTER — Ambulatory Visit: Payer: Medicare Other | Attending: Cardiovascular Disease | Admitting: Cardiovascular Disease

## 2024-09-19 ENCOUNTER — Encounter: Payer: Self-pay | Admitting: Cardiovascular Disease

## 2024-09-19 VITALS — BP 110/72 | HR 66 | Ht 63.0 in | Wt 149.8 lb

## 2024-09-19 DIAGNOSIS — I471 Supraventricular tachycardia, unspecified: Secondary | ICD-10-CM

## 2024-09-19 NOTE — Patient Instructions (Signed)

## 2024-09-19 NOTE — Progress Notes (Signed)
 Electrophysiology Office Note:    Date:  09/19/2024   ID:  Kayla Dean, DOB [birth year], MRN [id number]  PCP:  Tobie Suzzane POUR, MD   [facility] Providers Cardiologist:  Jayson Sierras, MD Electrophysiologist:  Eulas FORBES Furbish, MD     Referring MD: Melvenia Manus FORBES, MD   History of Present Illness:    Kayla Dean is a 73 y.o. female with a medical history significant for intestinal hernias, PE/DVT referred for evaluation and management of palpitations and SVT.     I discussed the use of AI scribe software for clinical note transcription with the patient, who gave verbal consent to proceed.  The patient, with a history of multiple ICU admissions due to cellulitis and low blood pressure, presents with rapid heart rates that she has been experiencing for the past two or three months. She first noticed these symptoms after her hospital stays when she was told by some nurses that her monitor showed AF. No strips documenting AF were saved to her chart. She describes the sensation as a sudden racing of her heart that subsides after a few seconds. The patient also reports severe pain related to her intestinal issues, which sometimes distracts her from the heart symptoms. 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 The patient is currently on midodrine  and fludrocortisone  for her low blood pressure, which she feels has helped. However, she expresses a desire to reduce her medication intake.     Today, she reports that she feels well.  She occasionally has palpitations these rarely last anything longer than a few seconds.  EKGs/Labs/Other Studies Reviewed Today:     Echocardiogram:  TTE 03/24/2023 LV EF 60-65%. Mildly dilated LA   Monitors:  Zio monitor 14 days  08/07/2023 -- my interpretation Sinus rhythm heart rate 58 to 140 bpm, average 86 bpm There were multiple episodes of SVT, the longest of which was less than half a minute.   Rate 150 bpm  Zio monitor 7 days  01/2024 -- my interpretation Sinus rhythm heart rate 46 to 122 bpm, average 69 bpm 94 brief episodes of SVT, average rate 119 bpm, most consistent with atrial runs, atrial tachycardia.  The longest episode was approximately 20 minutes with an average rate of 107 bpm.   Symptom episodes correlate with sinus rhythm and PACs, atrial runs  EKG:         Physical Exam:    VS:  BP 110/72 (BP Location: Left Arm, Cuff Size: Normal)   Pulse 66   Ht 5' 3 (1.6 m)   Wt 149 lb 12.8 oz (67.9 kg)   SpO2 99%   BMI 26.54 kg/m     Wt Readings from Last 3 Encounters:  09/19/24 149 lb 12.8 oz (67.9 kg)  09/18/24 150 lb (68 kg)  08/12/24 147 lb 9.6 oz (67 kg)     GEN: Well nourished, well developed in no acute distress CARDIAC: RRR, no murmurs, rubs, gallops RESPIRATORY:  Normal work of breathing MUSCULOSKELETAL: no edema    ASSESSMENT & PLAN:     SVT Detected on 14-day Zio patch after AF was reportedly seen during a hospitalization Some episodes sustained - 20 minutes on most recent monitor though rate was only 107 bpm Pt has experienced brief palpitations  Continue Metoprolol  25mg  PO BID She is not a good candidate for EP study ablation Her arrhythmia may improve as her GI issues improve     Signed, Eulas FORBES  Emeree Mahler, MD  09/19/2024 10:38 AM    [facility]

## 2024-10-03 ENCOUNTER — Other Ambulatory Visit: Payer: Self-pay

## 2024-10-03 ENCOUNTER — Encounter (HOSPITAL_COMMUNITY): Payer: Self-pay | Admitting: Obstetrics and Gynecology

## 2024-10-03 NOTE — Anesthesia Preprocedure Evaluation (Signed)
"                                    Anesthesia Evaluation    Reviewed: Allergy & Precautions, H&P , Patient's Chart, lab work & pertinent test results  Airway        Dental   Pulmonary neg pulmonary ROS          Cardiovascular Exercise Tolerance: Good negative cardio ROS      Neuro/Psych  Headaches negative neurological ROS  negative psych ROS   GI/Hepatic negative GI ROS, Neg liver ROS, hiatal hernia,GERD  ,,  Endo/Other  negative endocrine ROSHypothyroidism    Renal/GU negative Renal ROS  negative genitourinary   Musculoskeletal  (+) Arthritis , Osteoarthritis,    Abdominal   Peds  Hematology negative hematology ROS (+) Blood dyscrasia, anemia   Anesthesia Other Findings   Reproductive/Obstetrics negative OB ROS                              Anesthesia Physical Anesthesia Plan  ASA: 2  Anesthesia Plan: General   Post-op Pain Management: Tylenol  PO (pre-op)*   Induction: Intravenous  PONV Risk Score and Plan: 4 or greater and Ondansetron , Dexamethasone  and Treatment may vary due to age or medical condition  Airway Management Planned: Oral ETT  Additional Equipment:   Intra-op Plan:   Post-operative Plan: Extubation in OR  Informed Consent: I have reviewed the patients History and Physical, chart, labs and discussed the procedure including the risks, benefits and alternatives for the proposed anesthesia with the patient or authorized representative who has indicated his/her understanding and acceptance.       Plan Discussed with:   Anesthesia Plan Comments: (PAT note written 10/03/2024 by Acie Custis, PA-C.  )         Anesthesia Quick Evaluation  "

## 2024-10-03 NOTE — Progress Notes (Signed)
 Anesthesia Chart Review: SAME DAY WORK-UP   Case: [id number] Date/Time: 10/06/24 0715   Procedures:      COLPORRHAPHY, POSTERIOR, FOR RECTOCELE REPAIR     COLPORRHAPHY, ANTERIOR, FOR CYSTOCELE REPAIR   Anesthesia type: General   Diagnosis: Vaginal prolapse [N81.10]   Pre-op diagnosis: vaginal prolapse   [facility] ROOM 15 / [facility] OR   Surgeons: Sudie Lavonia HERO, MD       DISCUSSION: Patient is a 73 year old female scheduled for the above procedure.  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 12/17/2013; s/p redo repair, reversal of fundoplication, Roux-en-Y bypass, gastrojejunostomy 09/19/2017 c/b SBO s/p open small bowel resection with two entero-enterostomies 09/24/2017), cholecystectomy (05/15/2018), ventral hernia (s/p open ventral hernia repair 10/11/2018; recurrent ventral hernia with incarceration and liver shock, s/p exploratory laparotomy, hernia reduction and primary [facility] 02/02/2023, had postoperative persistent abdominal pain, N/V with malnutrition, s/p admission for TPN and s/p exploratory laparotomy & celiotomy, open resection of distal roux limb 12/10/2023), PSVT (2024, 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 difficile colitis (08/2023), PE/RLE DVT (right PE 04/13/2023; RLE DVT 04/20/2023, likely provoked from recent surgery, s/p course of Eliquis ). 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   Last EP follow-up with Dr. Nancey was on 09/19/2024 for follow-up palpitations. Possible AF noted on monitor during previous admission. She wore a Zio monitor in 07/2023 and 01/2024 with both showing SR with multiple episodes of SVT (longest < 30 minutes). She noted brief palpitations, improved overall. Not a good candidate for EP study ablation. Will continue Metoprolol  25 mg BID. One year follow-up planned. Last primary cardiology visit on 9/16/025 with Miriam Norris, NP. She had CCTA in 11/2022 for chest pain  evaluation and showed mild nonobstructive CAD, CAC 122 (71st percentile, hiatal hernia, 39 mm ascending aorta. TTE in June 2025 showed LVEF 55 to 60%, no regional wall motion abnormalities, mild concentric LVH, grade 1 diastolic dysfunction, normal RV systolic function, normal PASP, estimated RVSP 34.8 mmHg, mild MR mild AR. Plan for repeat echo ~ 3-5 years. Six month follow-up planned. NP added preoperative CV risk assessment: Ms. Leedy perioperative risk of a major cardiac event is 0.4% according to the Revised Cardiac Risk Index (RCRI).  Therefore, she is at low risk for perioperative complications.   Her functional capacity is excellent at 6.05 METs according to the Duke Activity Status Index (DASI). Recommendations: According to ACC/AHA guidelines, no further cardiovascular testing needed.  The patient may proceed to surgery at acceptable risk.   Antiplatelet and/or Anticoagulation Recommendations: She is not on any antiplatelet or anticoagulation medication that needs to be held prior to procedure.   Anesthesia team to evaluate on the day of surgery.   VS:  Wt Readings from Last 3 Encounters:  09/19/24 67.9 kg  09/18/24 68 kg  08/12/24 67 kg   BP Readings from Last 3 Encounters:  09/19/24 110/72  09/18/24 137/85  08/12/24 136/70   Pulse Readings from Last 3 Encounters:  09/19/24 66  09/18/24 81  08/12/24 75     PROVIDERS: Tobie Suzzane POUR, MD is PCP  Debera Savant, MD is cardiologist  Mealor, Eulas, MD is EP cardiologist  Davonna Siad, MD is HEM   LABS: Updated labs on arrival as indicated.  Most recent results in [facility] include: Lab Results  Component Value Date   WBC 7.1 04/10/2024   HGB 12.9 04/10/2024   HCT 40.5 04/10/2024   PLT 196 04/10/2024   GLUCOSE 90 08/12/2024  CHOL 147 01/04/2024   TRIG 85 01/04/2024   HDL 56 01/04/2024   LDLCALC 75 01/04/2024   ALT 20 08/12/2024   AST 21 08/12/2024   NA 139 08/12/2024   K 4.7 08/12/2024   CL 100  08/12/2024   CREATININE 0.62 08/12/2024   BUN 11 08/12/2024   CO2 25 08/12/2024   TSH 2.990 08/12/2024   HGBA1C 4.9 01/04/2024     EKG: 07/01/2024: Normal sinus rhythm Left anterior fascicular block Moderate voltage criteria for LVH, may be normal variant ( R in aVL , Cornell product ) When compared with ECG of 15-Oct-2023 16:00, No significant change was found Confirmed by Miriam Norris [phone]) on 07/01/2024 10:52:44 AM   CV: Echo 03/20/2024:  1. Left ventricular ejection fraction, by estimation, is 55 to 60%. Left  ventricular ejection fraction by 3D volume is 59 %. The left ventricle has  normal function. The left ventricle has no regional wall motion  abnormalities. There is mild concentric  left ventricular hypertrophy. Left ventricular diastolic parameters are  consistent with Grade I diastolic dysfunction (impaired relaxation). The  average left ventricular global longitudinal strain is -16.4 %. The global  longitudinal strain is normal.   2. Right ventricular systolic function is normal. The right ventricular  size is normal. There is normal pulmonary artery systolic pressure. The  estimated right ventricular systolic pressure is 34.8 mmHg.   3. Left atrial size was mildly dilated.   4. Right atrial size was mildly dilated.   5. The mitral valve is grossly normal. Mild mitral valve regurgitation.   6. The aortic valve is tricuspid. There is moderate calcification of the  aortic valve. Aortic valve regurgitation is mild. Aortic valve  sclerosis/calcification is present, without any evidence of aortic  stenosis. Aortic regurgitation PHT measures 626  msec. Aortic valve mean gradient measures 6.0 mmHg.   7. Aortic dilatation noted. There is mild dilatation of the ascending  aorta, measuring 39 mm.   8. The inferior vena cava is normal in size with greater than 50%  respiratory variability, suggesting right atrial pressure of 3 mmHg.  - Comparison(s): A prior study was  performed on 04/13/2023. Prior images  reviewed side by side. LVEF 55-60%. Mild mitral, aortic, and tricuspid  regurgitation. Mildly dilated ascending aorta.     Zio Cardiac monitor 01/10/2024 - 01/17/2024:  Predominant rhythm is sinus with heart rate ranging from 46 bpm up to 122 bpm and average heart rate 69 bpm. There were occasional PACs representing 1.4% total beats with otherwise rare atrial couplets and triplets. There were rare PVCs including ventricular couplets and triplets representing less than 1% total beats. Multiple (94) episodes of PSVT were noted, the longest of which lasted for 20 minutes and 32 seconds with average heart rate 107 bpm.  Some correlation noted with patient triggered activity. No pauses or high degree heart block.   CCTA 11/23/2022: IMPRESSION: 1. Mild nonobstructive CAD, CADRADS = 2. 2. Coronary calcium  score of 122. This was 71st percentile for age and sex matched control. 3. Normal coronary origin with right dominance. 4.  Hiatal hernia present. 5.  Borderline dilation of ascending aorta at 39 mm.   Past Medical History:  Diagnosis Date   Anemia    Aortic aneurysm    Arthritis    C. difficile colitis 07/20/2023   GERD (gastroesophageal reflux disease)    H/O gastric bypass    Hiatal hernia    Hypothyroidism    Osteoporosis    Shock liver  02/03/2023   Small bowel obstruction [facility])     Past Surgical History:  Procedure Laterality Date   ABDOMINAL HYSTERECTOMY     BIOPSY  08/28/2023   Procedure: BIOPSY;  Surgeon: Cindie Carlin POUR, DO;  [facility];  Service: Endoscopy;;   CATARACT EXTRACTION Bilateral    CHOLECYSTECTOMY     COLON SURGERY     ENTEROSCOPY N/A 08/28/2023   Procedure: ENTEROSCOPY;  Surgeon: Cindie Carlin POUR, DO;  [facility];  Service: Endoscopy;  Laterality: N/A;  1015am, asa 3   ESOPHAGOGASTRODUODENOSCOPY (EGD) WITH PROPOFOL  N/A 08/28/2023   Procedure: ESOPHAGOGASTRODUODENOSCOPY (EGD) WITH PROPOFOL ;   Surgeon: Cindie Carlin POUR, DO;  [facility];  Service: Endoscopy;  Laterality: N/A;   EYE SURGERY     Cataract   FRACTURE SURGERY     GASTRIC BYPASS     HERNIA REPAIR     hiatal hernia   JOINT REPLACEMENT     LAPAROTOMY N/A 02/02/2023   Procedure: EXPLORATORY LAPAROTOMY, [name], EXPLANT OF ABDOMINAL MESH;  Surgeon: Kallie Manuelita BROCKS, MD;  Location: [facility]S;  Service: General;  Laterality: N/A;   REPLACEMENT TOTAL KNEE Right    ROTATOR CUFF REPAIR Right    SMALL INTESTINE SURGERY  02/02/2023   TOTAL HIP ARTHROPLASTY Right     MEDICATIONS:  acetaminophen  (TYLENOL ) 500 MG tablet   alendronate  (FOSAMAX ) 70 MG tablet   ascorbic acid (VITAMIN C) 500 MG tablet   BIOTIN 5000 PO   Calcium  Carb-Cholecalciferol (CALCIUM  600 + D) 600-200 MG-UNIT TABS   Cholecalciferol (VITAMIN D -3) 25 MCG (1000 UT) CAPS   cyanocobalamin  (VITAMIN B12) 1000 MCG tablet   cyclobenzaprine  (FLEXERIL ) 5 MG tablet   FEROSUL 325 (65 Fe) MG tablet   fludrocortisone  (FLORINEF ) 0.1 MG tablet   furosemide  (LASIX ) 20 MG tablet   gabapentin  (NEURONTIN ) 300 MG capsule   levothyroxine  (SYNTHROID ) 137 MCG tablet   meloxicam  (MOBIC ) 7.5 MG tablet   metoprolol  tartrate (LOPRESSOR ) 25 MG tablet   Multiple Vitamin (MULTIVITAMIN WITH MINERALS) TABS tablet   ondansetron  (ZOFRAN -ODT) 4 MG disintegrating tablet   pantoprazole  (PROTONIX ) 40 MG tablet   potassium chloride  SA (KLOR-CON  M) 20 MEQ tablet   zolpidem  (AMBIEN ) 5 MG tablet   Elastic Bandages & Supports (ABDOMINAL BINDER/ELASTIC MED) MISC    Isaiah Ruder, PA-C Surgical Short Stay/Anesthesiology [facility] Phone [phone] [facility] Phone [phone] 10/03/2024 12:37 PM

## 2024-10-03 NOTE — Pre-Procedure Instructions (Signed)
 SDW CALL  Patient was given pre-op instructions over the phone. The opportunity was given for the patient to ask questions. No further questions asked. Patient verbalized understanding of instructions given.   Tobie Suzzane POUR, MD is PCP  Debera Savant, MD is cardiologist  Mealor, Eulas, MD is EP cardiologist  Davonna Siad, MD is HEM   PPM/ICD - denies   Chest x-ray -  N/A EKG - 07/01/24 Stress Test - 08/14/18 ECHO - 03/20/24 Cardiac Cath -   Sleep Study - denies   Fasting Blood Sugar - N/A  Blood Thinner Instructions: n/A Aspirin  Instructions:N/A  ERAS Protcol - ERAS until 430   COVID TEST-  N/A   Anesthesia review: yes- recent cardiac testing due to palpitations. Pt state she does occasionally have dizziness, but does not pass out. She does say that her cardiologist is aware of this.   Patient denies shortness of breath, fever, cough and chest pain over the phone call    Surgical Instructions    Your procedure is scheduled on December 2,2 2025  Report to [facility] Main Entrance A at 5:30 A.M., then check in with the Admitting office.  Call this number if you have problems the morning of surgery:  [phone]    Remember:  Do not eat after midnight the night before your surgery  You may drink clear liquids until 4:30AM the morning of your surgery.   Clear liquids allowed are: Water, Non-Citrus Juices (without pulp), Carbonated Beverages, Clear Tea, Black Coffee ONLY (NO MILK, CREAM OR POWDERED CREAMER of any kind, no honey), and Gatorade   Take these medicines the morning of surgery with A SIP OF WATER:  Florinef , Synthroid , Protonix , Metoprolol  If needed: tylenel, zofran    As of today, STOP taking any Aspirin  (unless otherwise instructed by your surgeon) Aleve, Naproxen, Ibuprofen, Motrin, Advil, Goody's, BC's, all herbal medications, fish oil, and all vitamins.  [facility] is not responsible for any belongings or valuables.   Contacts,  glasses, hearing aids, dentures or partials may not be worn into surgery, please bring cases for these belongings   Patients discharged the day of surgery will not be allowed to drive home, and someone needs to stay with them for 24 hours.   SURGICAL WAITING ROOM VISITATION You may have 1-2 visitor in the pre-op area at a time determined by the pre-op nurse. (Visitor may not switch out) Patients having surgery or a procedure in a hospital may have two support people in the waiting room. Children under the age of 38 must have an adult with them who is not the patient.  Please refer to the [facility] website for the visitor guidelines for Inpatients (after your surgery is over and you are in a regular room).      Day of Surgery:  Take a shower the day of or night before with antibacterial soap. Wear Clean/Comfortable clothing the morning of surgery Do not apply any deodorants/lotions.   Do not wear jewelry or makeup Do not wear lotions, powders, perfumes/colognes, or deodorant. Do not shave 48 hours prior to surgery.   Do not bring valuables to the hospital. Do not wear nail polish, gel polish, artificial nails, or any other type of covering on natural nails (fingers and toes)  Remember to brush your teeth WITH YOUR REGULAR TOOTHPASTE.

## 2024-10-05 NOTE — H&P (Signed)
 Kayla Dean is an 73 y.o. female presenting for scheduled surgery. Had recent cardiology and Gen surg follow up for palpitations and small ventral hernia, respectively. For the former, Zio monitor with stable occ SVT runs. Gen Surg does not require any emergent hernia surgery, pt aware she has CT planned in January  Pertinent Gynecological History: Menses: post-menopausal Bleeding: none - s/p hyst Contraception: status post hysterectomy DES exposure: denies Blood transfusions: none Sexually transmitted diseases: no past history Previous GYN Procedures: abdominal hystereectomy  Last mammogram: normal Date: 09/2023 Last pap: none Date: 2022 OB History: G4, P4004, SVD x4   Menstrual History: Menarche age: middle school No LMP recorded. Patient has had a hysterectomy.    Past Medical History:  Diagnosis Date   Anemia    Aortic aneurysm    Arthritis    C. 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   Small bowel obstruction (HCC)    SVT (supraventricular tachycardia)     Past Surgical History:  Procedure Laterality Date   ABDOMINAL HYSTERECTOMY     BIOPSY  08/28/2023   Procedure: BIOPSY;  Surgeon: Kayla Dean;  [facility]: [facility];  Service: Endoscopy;;   CATARACT EXTRACTION Bilateral    CHOLECYSTECTOMY     COLON SURGERY     ENTEROSCOPY N/A 08/28/2023   Procedure: ENTEROSCOPY;  Surgeon: Kayla Dean;  Location: [facility];  Service: Endoscopy;  Laterality: N/A;  1015am, asa 3   ESOPHAGOGASTRODUODENOSCOPY (EGD) WITH PROPOFOL  N/A 08/28/2023   Procedure: ESOPHAGOGASTRODUODENOSCOPY (EGD) WITH PROPOFOL ;  Surgeon: Kayla Dean;  Location: [facility];  Service: Endoscopy;  Laterality: N/A;    EYE SURGERY     Cataract   FRACTURE SURGERY Right    knee   GASTRIC BYPASS     HERNIA REPAIR     hiatal hernia   JOINT REPLACEMENT     LAPAROTOMY N/A 02/02/2023   Procedure: EXPLORATORY LAPAROTOMY, [name], EXPLANT OF ABDOMINAL MESH;  Surgeon: Kayla Dean;  Location: [facility] ORS;  Service: General;  Laterality: N/A;   REPLACEMENT TOTAL KNEE Right    ROTATOR CUFF REPAIR Right    SMALL INTESTINE SURGERY  02/02/2023   TOTAL HIP ARTHROPLASTY Right     Family History  Problem Relation Age of Onset   CAD Father    Heart disease Father    CAD Brother    Heart disease Brother     Social History:  reports that she has never smoked. She has never used smokeless tobacco. She reports that she does not drink alcohol and does not use drugs.  Allergies: Allergies[1]  No medications prior to admission.    Review of Systems  Constitutional:  Negative for chills and fever.  Respiratory:  Negative for shortness of breath.   Cardiovascular:  Negative for chest pain, palpitations and leg swelling.  Gastrointestinal:  Negative for abdominal pain, nausea and vomiting.  Genitourinary:  Positive for vaginal pain (pelvic pressure c/w known rectocele).  Neurological:  Negative for dizziness, weakness and headaches.  Psychiatric/Behavioral:  Negative for suicidal ideas.     Height 5' 3 (1.6 m), weight 68 kg. Physical Exam Gen: NAD CV: RRR< no  MRG Abd: Multiple prior abdominal scars (prior hyst, lap chole, hernia repair, bypass, SBO surgery) No results found for this or any previous visit (from the past 24 hours).  No results found.  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 Desires definitive medical management in the form of posterior colporraphy. Risks of procedure. [email or web address] Patient accepts the possibility of blood transfusion, if  necessary. Bowel and/or bladder injury may require prolonged inpatient stay and possible colostomy, Foley catheter, etc, as deemed fit by other surgeon. Patient understands and agrees to move forward with surgery. Plan to continue patient's chronic medication postop to include thryoid meds, gabapentin , metoprolol . Per Cardiology preop clearance, no formal anticoagulation is required. Most recent cardiac echo in June is EF 60%  Kayla Dean [name] 10/05/2024, 9:50 PM     [1] No Known Allergies

## 2024-10-06 ENCOUNTER — Encounter (HOSPITAL_COMMUNITY): Payer: Self-pay | Admitting: Vascular Surgery

## 2024-10-06 ENCOUNTER — Emergency Department (HOSPITAL_COMMUNITY)
Admission: RE | Admit: 2024-10-06 | Discharge: 2024-10-06 | Disposition: A | Discharge status: Home / Self Care | Attending: Obstetrics and Gynecology | Admitting: Obstetrics and Gynecology

## 2024-10-06 ENCOUNTER — Other Ambulatory Visit: Payer: Self-pay

## 2024-10-06 ENCOUNTER — Encounter: Admission: RE | Disposition: A | Payer: Self-pay | Attending: Emergency Medicine

## 2024-10-06 ENCOUNTER — Ambulatory Visit (HOSPITAL_COMMUNITY)

## 2024-10-06 ENCOUNTER — Encounter (HOSPITAL_COMMUNITY): Payer: Self-pay | Admitting: Obstetrics and Gynecology

## 2024-10-06 DIAGNOSIS — W1809XA Striking against other object with subsequent fall, initial encounter: Secondary | ICD-10-CM | POA: Diagnosis not present

## 2024-10-06 DIAGNOSIS — Z79890 Hormone replacement therapy (postmenopausal): Secondary | ICD-10-CM | POA: Insufficient documentation

## 2024-10-06 DIAGNOSIS — S8002XA Contusion of left knee, initial encounter: Secondary | ICD-10-CM | POA: Diagnosis not present

## 2024-10-06 DIAGNOSIS — M25562 Pain in left knee: Secondary | ICD-10-CM

## 2024-10-06 DIAGNOSIS — S0990XA Unspecified injury of head, initial encounter: Secondary | ICD-10-CM | POA: Diagnosis present

## 2024-10-06 DIAGNOSIS — W19XXXA Unspecified fall, initial encounter: Secondary | ICD-10-CM

## 2024-10-06 DIAGNOSIS — S0083XA Contusion of other part of head, initial encounter: Secondary | ICD-10-CM | POA: Insufficient documentation

## 2024-10-06 DIAGNOSIS — E039 Hypothyroidism, unspecified: Secondary | ICD-10-CM | POA: Diagnosis not present

## 2024-10-06 HISTORY — DX: Supraventricular tachycardia, unspecified: I47.10

## 2024-10-06 HISTORY — DX: Unspecified abdominal hernia without obstruction or gangrene: K46.9

## 2024-10-06 SURGERY — COLPORRHAPHY, POSTERIOR, FOR RECTOCELE REPAIR
Anesthesia: General

## 2024-10-06 MED ORDER — SUGAMMADEX SODIUM 200 MG/2ML IV SOLN
INTRAVENOUS | Status: AC
  Filled 2024-10-06: qty 2

## 2024-10-06 MED ORDER — HYDROCODONE-ACETAMINOPHEN 5-325 MG PO TABS: 1.0000 | ORAL_TABLET | Freq: Four times a day (QID) | ORAL | 0 refills | Status: AC | PRN

## 2024-10-06 MED ORDER — PHENYLEPHRINE 80 MCG/ML (10ML) SYRINGE FOR IV PUSH (FOR BLOOD PRESSURE SUPPORT)
PREFILLED_SYRINGE | INTRAVENOUS | Status: AC
  Filled 2024-10-06: qty 10

## 2024-10-06 MED ORDER — ROCURONIUM BROMIDE 10 MG/ML (PF) SYRINGE
PREFILLED_SYRINGE | INTRAVENOUS | Status: AC
  Filled 2024-10-06: qty 10

## 2024-10-06 MED ORDER — HYDROCODONE-ACETAMINOPHEN 5-325 MG PO TABS
1.0000 | ORAL_TABLET | Freq: Once | ORAL | Status: AC
  Administered 2024-10-06: 1 via ORAL
  Filled 2024-10-06: qty 1

## 2024-10-06 MED ORDER — PROPOFOL 10 MG/ML IV BOLUS
INTRAVENOUS | Status: AC
  Filled 2024-10-06: qty 20

## 2024-10-06 MED ORDER — FENTANYL CITRATE (PF) 250 MCG/5ML IJ SOLN
INTRAMUSCULAR | Status: AC
  Filled 2024-10-06: qty 5

## 2024-10-06 MED ORDER — ONDANSETRON HCL 4 MG/2ML IJ SOLN
INTRAMUSCULAR | Status: AC
  Filled 2024-10-06: qty 2

## 2024-10-06 MED ORDER — LIDOCAINE 2% (20 MG/ML) 5 ML SYRINGE
INTRAMUSCULAR | Status: AC
  Filled 2024-10-06: qty 5

## 2024-10-06 NOTE — Discharge Instructions (Addendum)
 It was a pleasure taking care of you today.  As discussed, your imaging was unremarkable.  Continue to ice and elevate your left knee.  I am sending you home with pain medication.  Save for severe pain.  Medication can cause drowsiness so do not drive or operate machinery while on the medication.  Please follow-up with PCP if symptoms do not improve over the next few days.  Please call to reschedule your surgery.  Return to the ER for any worsening symptoms.

## 2024-10-06 NOTE — ED Provider Triage Note (Signed)
 Emergency Medicine Provider Triage Evaluation Note  Kayla Dean , a 73 y.o. female  was evaluated in triage.  Pt complains of fall.  She presents to the ED from preop following a fall.  She was scheduled for surgery this morning.  She states that her foot stuck on the ground and she stumbled and fell forward, striking her left knee and her head.  No loss of consciousness or vomiting..  Review of Systems  Positive: Head injury, knee pain Negative: Syncope, vomiting  Physical Exam  BP (!) 182/93   Pulse 76   Temp 98.2 F (36.8 C) (Oral)   Resp 18   Ht 5' 3 (1.6 m)   Wt 68 kg   SpO2 99%   BMI 26.57 kg/m  Gen:   Awake, no distress   Resp:  Normal effort  MSK:   Moves extremities without difficulty soft tissue swelling and ecchymosis over the left knee, flexion extension intact at the knee. Other:  Soft tissue swelling and ecchymosis over the left temple.  Medical Decision Making  Medically screening exam initiated at 6:49 AM.  Appropriate orders placed.  Kayla Dean was informed that the remainder of the evaluation will be completed by another provider, this initial triage assessment does not replace that evaluation, and the importance of remaining in the ED until their evaluation is complete.     Kayla Norris, MD 10/06/24 [phone]

## 2024-10-06 NOTE — ED Provider Notes (Signed)
 " [place] [facility] Provider Note   CSN: [id number] Arrival date & time: 10/06/24  [id number]     Patient presents with: No chief complaint on file.   Kayla Dean is a 73 y.o. female with a past medical history significant for hypothyroidism, GERD, history of DVT/PE (suspected to be provoked secondary to immobilization per patient- not on any anticoagulants), hx SVT who presents to the ED from preop after a mechanical fall.  Patient was scheduled for a rectocele repair by Dr. Sudie with GYN. Patient was getting undressed this morning in preop when her shoe got stuck causing her to fall and hit the left side of her face on the ground.  Also notes she landed on her left knee.  No LOC.  Not on any blood thinners.  Patient admits to left knee pain.  Denies nausea and vomiting.  Denies dizziness, visual changes, speech changes, or unilateral weakness.  No other injuries.  Denies numbness/tingling. No neck pain.   History obtained from patient and past medical records. No interpreter used during encounter.       Prior to Admission medications  Medication Sig Start Date End Date Taking? Authorizing Provider  acetaminophen  (TYLENOL ) 500 MG tablet Take 500 mg by mouth every 6 (six) hours as needed.   Yes [provider]  alendronate  (FOSAMAX ) 70 MG tablet Take 1 tablet (70 mg total) by mouth every 7 (seven) days. Take with a full glass of water on an empty stomach. 04/28/24  Yes Tobie Suzzane POUR, MD  ascorbic acid (VITAMIN C) 500 MG tablet Take 500 mg by mouth daily.   Yes [provider]  BIOTIN 5000 PO Take 5,000 mcg by mouth daily.   Yes [provider]  Calcium  Carb-Cholecalciferol (CALCIUM  600 + D) 600-200 MG-UNIT TABS Take 1 tablet by mouth every morning.   Yes [provider]  Cholecalciferol (VITAMIN D -3) 25 MCG (1000 UT) CAPS Take by mouth daily.   Yes [provider]  cyanocobalamin  (VITAMIN B12) 1000 MCG  tablet Take 1,000 mcg by mouth daily.   Yes [provider]  cyclobenzaprine  (FLEXERIL ) 5 MG tablet TAKE ONE TABLET BY MOUTH AT BEDTIME 08/05/24  Yes Patel, Suzzane POUR, MD  FEROSUL 325 (65 Fe) MG tablet Take 325 mg by mouth every morning. 06/23/24  Yes [provider]  fludrocortisone  (FLORINEF ) 0.1 MG tablet TAKE ONE TABLET BY MOUTH EVERY DAY 07/29/24  Yes Miriam Norris, NP  furosemide  (LASIX ) 20 MG tablet Take 1 tablet (20 mg total) by mouth daily as needed for fluid or edema. 09/17/23  Yes Miriam Norris, NP  gabapentin  (NEURONTIN ) 300 MG capsule Take 1 capsule (300 mg total) by mouth at bedtime. 04/10/24  Yes Tobie Suzzane POUR, MD  levothyroxine  (SYNTHROID ) 137 MCG tablet Take 1 tablet (137 mcg total) by mouth daily before breakfast. 08/12/24  Yes Tobie Suzzane POUR, MD  meloxicam  (MOBIC ) 7.5 MG tablet Take 1 tablet (7.5 mg total) by mouth daily. 08/12/24  Yes Tobie Suzzane POUR, MD  metoprolol  tartrate (LOPRESSOR ) 25 MG tablet Take 1 tablet (25 mg total) by mouth 2 (two) times daily. 03/24/24  Yes Miriam Norris, NP  Multiple Vitamin (MULTIVITAMIN WITH MINERALS) TABS tablet Take 1 tablet by mouth every morning. With iron /425 mg   Yes [provider]  ondansetron  (ZOFRAN -ODT) 4 MG disintegrating tablet Take 1 tablet (4 mg total) by mouth every 6 (six) hours as needed for nausea. 02/08/23  Yes Kallie Manuelita BROCKS, MD  pantoprazole  (PROTONIX ) 40 MG tablet Take 1 tablet (40 mg total) by mouth 2 (two) times daily. 08/12/24  Yes Tobie Suzzane POUR, MD  potassium chloride  SA (KLOR-CON  M) 20 MEQ tablet TAKE TWO TABLETS BY MOUTH ONCE DAILY AS NEEDED (WHEN TAKING FUROSEMIDE ) 09/17/24  Yes Tobie Suzzane POUR, MD  zolpidem  (AMBIEN ) 5 MG tablet Take 1 tablet (5 mg total) by mouth at bedtime as needed for sleep. 02/15/23  Yes Kallie Manuelita BROCKS, MD  Elastic Bandages & Supports (ABDOMINAL BINDER/ELASTIC MED) MISC Apply as directed for orthostatic hypotension 05/23/23   Miriam Norris, NP    Allergies:  Patient has no known allergies.    Review of Systems  Eyes:  Negative for visual disturbance.  Respiratory:  Negative for shortness of breath.   Cardiovascular:  Negative for chest pain.  Gastrointestinal:  Negative for abdominal pain.  Musculoskeletal:  Positive for arthralgias.  Neurological:  Negative for dizziness, facial asymmetry, weakness, numbness and headaches.    Updated Vital Signs BP (!) 182/93   Pulse 76   Temp 98.2 F (36.8 C) (Oral)   Resp 18   Ht 5' 3 (1.6 m)   Wt 68 kg   SpO2 99%   BMI 26.57 kg/m   Physical Exam Vitals and nursing note reviewed.  Constitutional:      General: She is not in acute distress.    Appearance: She is not ill-appearing.  HENT:     Head: Normocephalic.     Comments: Hematoma to left temple. EOMS intact.  Eyes:     Pupils: Pupils are equal, round, and reactive to light.  Neck:     Comments: No cervical midline tenderness.  Cardiovascular:     Rate and Rhythm: Normal rate and regular rhythm.     Pulses: Normal pulses.     Heart sounds: Normal heart sounds. No murmur heard.    No friction rub. No gallop.  Pulmonary:     Effort: Pulmonary effort is normal.     Breath sounds: Normal breath sounds.  Abdominal:     General: Abdomen is flat. There is no distension.     Palpations: Abdomen is soft.     Tenderness: There is no abdominal tenderness. There is no guarding or rebound.  Musculoskeletal:        General: Normal range of motion.     Cervical back: Neck supple.     Comments: Ecchymosis and edema to left knee.  Full extension and flexion of left knee.  Left lower extremity neurovascularly intact with soft compartments.  Skin:    General: Skin is warm and dry.  Neurological:     General: No focal deficit present.     Mental Status: She is alert.     Comments: Speech is clear, able to follow commands CN III-XII intact Normal strength in upper and lower extremities bilaterally including dorsiflexion and plantar flexion,  strong and equal grip strength Sensation grossly intact throughout Moves extremities without ataxia, coordination intact No pronator drift   Psychiatric:        Mood and Affect: Mood normal.        Behavior: Behavior normal.     (all labs ordered are listed, but only abnormal results are displayed) Labs Reviewed - No data to display  EKG: None  Radiology: DG Knee Left Port Result Date: 10/06/2024 EXAM: 1 or 2 VIEW(S) XRAY OF THE LEFT KNEE 10/06/2024 07:49:56 AM COMPARISON: None available. CLINICAL HISTORY: Pain FINDINGS: BONES AND JOINTS: No acute fracture. No malalignment.  No significant joint effusion. There is moderate patellofemoral arthrosis. There are meniscal calcifications present. The bones appear diffusely osteopenic. SOFT TISSUES: Soft tissue swelling. IMPRESSION: 1. Moderate patellofemoral arthrosis. 2. Meniscal calcifications, which can be seen with CPPD arthropathy. 3. Diffuse osteopenia. 4. Soft tissue swelling. Electronically signed by: Evalene Coho MD 10/06/2024 08:06 AM EST RP Workstation: [id number]   CT Head Wo Contrast Result Date: 10/06/2024 CLINICAL DATA:  Status post fall. EXAM: CT HEAD WITHOUT CONTRAST TECHNIQUE: Contiguous axial images were obtained from the base of the skull through the vertex without intravenous contrast. RADIATION DOSE REDUCTION: This exam was performed according to the departmental dose-optimization program which includes automated exposure control, adjustment of the mA and/or kV according to patient size and/or use of iterative reconstruction technique. COMPARISON:  May 16, 2023 FINDINGS: Brain: There is generalized cerebral atrophy with widening of the extra-axial spaces and ventricular dilatation. There are areas of decreased attenuation within the white matter tracts of the supratentorial brain, consistent with microvascular disease changes. Vascular: No hyperdense vessel or unexpected calcification. Skull: Normal. Negative for fracture  or focal lesion. Sinuses/Orbits: No acute finding. Other: None. IMPRESSION: No acute intracranial abnormality. Electronically Signed   By: Suzen Dials M.D.   On: 10/06/2024 07:07     Procedures   Medications Ordered in the ED  HYDROcodone -acetaminophen  (NORCO/VICODIN) 5-325 MG per tablet 1 tablet (has no administration in time range)                                    Medical Decision Making Amount and/or Complexity of Data Reviewed Independent Historian: caregiver    Details: Family member at bedside provided some history Radiology: ordered and independent interpretation performed. Decision-making details documented in ED Course.  Risk Prescription drug management.   This patient presents to the ED for concern of mechanical fall, this involves an extensive number of treatment options, and is a complaint that carries with it a high risk of complications and morbidity.  The differential diagnosis includes intracranial bleed, entrapment, bony fracture, etc  73 year old female presents to the ED after a mechanical fall while in preop awaiting rectocele repair.  Patient hit left side of face and landed on left knee.  Not on any blood thinners.  No LOC.  Upon arrival, stable vitals. O2 saturation initially documents as 91%; however upon recheck 99%. Suspect inaccurate read.  Patient well-appearing on exam.  Does have hematoma to left temple and ecchymosis below left eye.  EOMs intact.  Low suspicion for entrapment. No bony tenderness to face. No signs of basilar skull fracture on exam.  No cervical, thoracic, or lumbar midline tenderness.  Normal neurological exam without neurological deficits.  CT head and left knee x-ray ordered in triage which I personally reviewed and interpreted which are negative for any acute abnormalities.  No intracranial bleed.  No bony fracture. Does demonstrate some soft tissue swelling to knee.  Patient placed in knee sleeve and given Norco for pain management.   Patient discharged with pain medication. RICE discussed with patient. Patient has cane at home. Low suspicion for any emergent injuries. Discussed with Dr. Garrick who evaluated patient at bedside and agrees with assessment and plan. Patient stable for discharge. Strict ED precautions discussed with patient. Patient states understanding and agrees to plan. Patient discharged home in no acute distress and stable vitals  Elderly >65 Hx PE/DVT- not on any anticoagulant    Final diagnoses:  Fall, initial encounter  Injury of head, initial encounter  Acute pain of left knee    ED Discharge Orders     None          Lorelle Aleck JAYSON DEVONNA 10/06/24 0831    Garrick Charleston, MD 10/06/24 1236  "

## 2024-10-06 NOTE — ED Triage Notes (Addendum)
 Patient was here for surgery scheduled at 0730. While the patient was getting undressed she fell in and hit her face. Patient presents with hematoma above the left eye. Patient was escorted to the ER by OR staff and to be looked at int he ER prior to the surgery. Denies blood thinners. Denies dizziness or headache.   Patient states when she went to take a step, her shoe stayed where it was. Patient also has a bruise on her left leg.

## 2024-10-06 NOTE — ED Notes (Signed)
 Dr Erma- Anest.

## 2024-10-06 NOTE — Progress Notes (Addendum)
 Late entry 0640  Patient was coming into the department and tripped on her shoes in the hallway. The patient faceplanted and her L knee formed a softball size bruise as well as a hematoma on her left temple with a small purple abrasion like linear line, no bleeding.  Called surgeon's office with no answer to urgent line, left message for Dr. Sudie on her cell phone, and then called Dr. Erma (anesthesiologist on call).  Dr. Treen recommended patient be taken to the emergency department for further evaluation. This nurse took patient via wheelchair to emergency department with her daughter with us .  Her daughter was present with us  the entire time.    9366 Dr. Sudie returned page and was informed that the patient was taken to the emergency room.

## 2024-10-06 NOTE — Progress Notes (Signed)
 LATE ENTRY  Received word as in RN notes that patient had fall while walking into preop. Patient was seen by me in ED triage after both CT and XRAY.  AAOx3, endorsing only mild frontal headache. Minimal ecchymosis approx 1x1cm above left temple. EOMI, PERRLA. Main complaint is pain and stiffness in left knee. Appears generally swollen, TTP along medial and lateral aspects. No TTP along patella.  Informed pt that CT head was overall benign, pt reassured. Overall in good mood. Informed today's surgery will be cancelled and rescheduled as not medically urgent. Patient and daughter understand

## 2024-10-08 ENCOUNTER — Ambulatory Visit (HOSPITAL_COMMUNITY)

## 2024-10-15 ENCOUNTER — Other Ambulatory Visit: Payer: Self-pay | Admitting: Internal Medicine

## 2024-10-15 DIAGNOSIS — M51362 Other intervertebral disc degeneration, lumbar region with discogenic back pain and lower extremity pain: Secondary | ICD-10-CM

## 2024-10-21 ENCOUNTER — Ambulatory Visit (HOSPITAL_COMMUNITY)
Admission: RE | Admit: 2024-10-21 | Discharge: 2024-10-21 | Disposition: A | Discharge status: Home / Self Care | Source: Ambulatory Visit | Attending: General Surgery | Admitting: General Surgery

## 2024-10-21 ENCOUNTER — Other Ambulatory Visit (HOSPITAL_COMMUNITY): Payer: Self-pay | Admitting: Internal Medicine

## 2024-10-21 DIAGNOSIS — K439 Ventral hernia without obstruction or gangrene: Secondary | ICD-10-CM | POA: Diagnosis present

## 2024-10-21 DIAGNOSIS — Z1231 Encounter for screening mammogram for malignant neoplasm of breast: Secondary | ICD-10-CM

## 2024-10-21 MED ORDER — IOHEXOL 300 MG/ML  SOLN
100.0000 mL | Freq: Once | INTRAMUSCULAR | Status: AC | PRN
  Administered 2024-10-21: 100 mL via INTRAVENOUS

## 2024-10-27 ENCOUNTER — Ambulatory Visit: Payer: Medicare Other

## 2024-10-27 VITALS — BP 132/83 | HR 67 | Ht 63.0 in | Wt 150.0 lb

## 2024-10-27 DIAGNOSIS — Z0000 Encounter for general adult medical examination without abnormal findings: Secondary | ICD-10-CM

## 2024-10-27 NOTE — Patient Instructions (Signed)
 Kayla Dean,  Thank you for taking the time for your Medicare Wellness Visit. I appreciate your continued commitment to your health goals. Please review the care plan we discussed, and feel free to reach out if I can assist you further.  Please note that Annual Wellness Visits do not include a physical exam. Some assessments may be limited, especially if the visit was conducted virtually. If needed, we may recommend an in-person follow-up with your provider.  Ongoing Care Seeing your primary care provider every 3 to 6 months helps us  monitor your health and provide consistent, personalized care.   1 year follow up for Medicare well visit: October 28, 2025 at 11:20 am with medicare wellness nurse in office    Recommended Screenings:  Health Maintenance  Topic Date Due   COVID-19 Vaccine (1) Never done   DTaP/Tdap/Td vaccine (1 - Tdap) Never done   Pneumococcal Vaccine for age over 30 (1 of 2 - PCV) Never done   Zoster (Shingles) Vaccine (1 of 2) Never done   Medicare Annual Wellness Visit  10/23/2024   Breast Cancer Screening  10/07/2024   Flu Shot  01/13/2025*   Osteoporosis screening with Bone Density Scan  04/15/2026   Colon Cancer Screening  05/26/2032   Hepatitis C Screening  Completed   Meningitis B Vaccine  Aged Out  *Topic was postponed. The date shown is not the original due date.       10/26/2024   11:10 PM  Advanced Directives  Does Patient Have a Medical Advance Directive? No  Would patient like information on creating a medical advance directive? No - Patient declined    Vision: Annual vision screenings are recommended for early detection of glaucoma, cataracts, and diabetic retinopathy. These exams can also reveal signs of chronic conditions such as diabetes and high blood pressure.  Dental: Annual dental screenings help detect early signs of oral cancer, gum disease, and other conditions linked to overall health, including heart disease and diabetes.  Please see  the attached documents for additional preventive care recommendations.

## 2024-10-27 NOTE — Progress Notes (Signed)
 "  Chief Complaint  Patient presents with   Medicare Wellness     Subjective:   Kayla Dean is a 74 y.o. female who presents for a Medicare Annual Wellness Visit.  Visit info / Clinical Intake: Medicare Wellness Visit Type:: Subsequent Annual Wellness Visit Persons participating in visit and providing information:: patient Medicare Wellness Visit Mode:: Video Since this visit was completed virtually, some vitals may be partially provided or unavailable. Missing vitals are due to the limitations of the virtual format.: Documented vitals are patient reported If Telephone or Video please confirm:: I connected with patient using audio/video enable telemedicine. I verified patient identity with two identifiers, discussed telehealth limitations, and patient agreed to proceed. Patient Location:: home Provider Location:: office Interpreter Needed?: No Pre-visit prep was completed: yes AWV questionnaire completed by patient prior to visit?: yes Date:: 10/26/24 Living arrangements:: (!) (Patient-Rptd) lives alone Patient's Overall Health Status Rating: (!) (Patient-Rptd) fair Typical amount of pain: (Patient-Rptd) some Does pain affect daily life?: (Patient-Rptd) no Are you currently prescribed opioids?: (!) yes  Dietary Habits and Nutritional Risks How many meals a day?: (Patient-Rptd) 4 Eats fruit and vegetables daily?: (Patient-Rptd) yes Most meals are obtained by: (Patient-Rptd) preparing own meals In the last 2 weeks, have you had any of the following?: none Diabetic:: no  Functional Status Activities of Daily Living (to include ambulation/medication): (Patient-Rptd) Independent Ambulation: (Patient-Rptd) Independent Medication Administration: (Patient-Rptd) Independent Home Management (perform basic housework or laundry): (Patient-Rptd) Independent Manage your own finances?: (Patient-Rptd) yes Primary transportation is: (Patient-Rptd) driving Concerns about vision?: no  *vision screening is required for WTM* Concerns about hearing?: no  Fall Screening Falls in the past year?: (Patient-Rptd) 1 Number of falls in past year: (Patient-Rptd) 1 Was there an injury with Fall?: (Patient-Rptd) 0 Fall Risk Category Calculator: (Patient-Rptd) 2 Patient Fall Risk Level: (Patient-Rptd) Moderate Fall Risk  Fall Risk Patient at Risk for Falls Due to: History of fall(s); Impaired balance/gait; Impaired mobility Fall risk Follow up: Falls evaluation completed; Education provided; Falls prevention discussed  Home and Transportation Safety: All rugs have non-skid backing?: (Patient-Rptd) yes All stairs or steps have railings?: (Patient-Rptd) yes Grab bars in the bathtub or shower?: (Patient-Rptd) yes Have non-skid surface in bathtub or shower?: (Patient-Rptd) yes Good home lighting?: (Patient-Rptd) yes Regular seat belt use?: (Patient-Rptd) yes Hospital stays in the last year:: (!) (Patient-Rptd) yes How many hospital stays:: (Patient-Rptd) 1  Cognitive Assessment Difficulty concentrating, remembering, or making decisions? : (Patient-Rptd) no Will 6CIT or Mini Cog be Completed: yes What year is it?: 0 points What month is it?: 0 points Give patient an address phrase to remember (5 components): [address] [place] About what time is it?: 0 points Count backwards from 20 to 1: 0 points Say the months of the year in reverse: 0 points Repeat the address phrase from earlier: 0 points 6 CIT Score: 0 points  Advance Directives (For Healthcare) Does Patient Have a Medical Advance Directive?: No Would patient like information on creating a medical advance directive?: No - Patient declined  Reviewed/Updated  Reviewed/Updated: Reviewed All (Medical, Surgical, Family, Medications, Allergies, Care Teams, Patient Goals)    Allergies (verified) Patient has no known allergies.   Current Medications (verified) Outpatient Encounter Medications as of 10/27/2024   Medication Sig   acetaminophen  (TYLENOL ) 500 MG tablet Take 500 mg by mouth every 6 (six) hours as needed.   alendronate  (FOSAMAX ) 70 MG tablet Take 1 tablet (70 mg total) by mouth every 7 (seven) days. Take  with a full glass of water on an empty stomach.   ascorbic acid (VITAMIN C) 500 MG tablet Take 500 mg by mouth daily.   BIOTIN 5000 PO Take 5,000 mcg by mouth daily.   Calcium  Carb-Cholecalciferol (CALCIUM  600 + D) 600-200 MG-UNIT TABS Take 1 tablet by mouth every morning.   Cholecalciferol (VITAMIN D -3) 25 MCG (1000 UT) CAPS Take by mouth daily.   cyanocobalamin  (VITAMIN B12) 1000 MCG tablet Take 1,000 mcg by mouth daily.   cyclobenzaprine  (FLEXERIL ) 5 MG tablet TAKE ONE TABLET BY MOUTH AT BEDTIME   Elastic Bandages & Supports (ABDOMINAL BINDER/ELASTIC MED) MISC Apply as directed for orthostatic hypotension   FEROSUL 325 (65 Fe) MG tablet Take 325 mg by mouth every morning.   fludrocortisone  (FLORINEF ) 0.1 MG tablet TAKE ONE TABLET BY MOUTH EVERY DAY   furosemide  (LASIX ) 20 MG tablet Take 1 tablet (20 mg total) by mouth daily as needed for fluid or edema.   gabapentin  (NEURONTIN ) 300 MG capsule TAKE ONE CAPSULE BY MOUTH AT BEDTIME   HYDROcodone -acetaminophen  (NORCO/VICODIN) 5-325 MG tablet Take 1 tablet by mouth every 6 (six) hours as needed.   levothyroxine  (SYNTHROID ) 137 MCG tablet Take 1 tablet (137 mcg total) by mouth daily before breakfast.   meloxicam  (MOBIC ) 7.5 MG tablet Take 1 tablet (7.5 mg total) by mouth daily.   metoprolol  tartrate (LOPRESSOR ) 25 MG tablet Take 1 tablet (25 mg total) by mouth 2 (two) times daily.   Multiple Vitamin (MULTIVITAMIN WITH MINERALS) TABS tablet Take 1 tablet by mouth every morning. With iron /425 mg   ondansetron  (ZOFRAN -ODT) 4 MG disintegrating tablet Take 1 tablet (4 mg total) by mouth every 6 (six) hours as needed for nausea.   pantoprazole  (PROTONIX ) 40 MG tablet Take 1 tablet (40 mg total) by mouth 2 (two) times daily.   potassium chloride  SA  (KLOR-CON  M) 20 MEQ tablet TAKE TWO TABLETS BY MOUTH ONCE DAILY AS NEEDED (WHEN TAKING FUROSEMIDE )   zolpidem  (AMBIEN ) 5 MG tablet Take 1 tablet (5 mg total) by mouth at bedtime as needed for sleep.   No facility-administered encounter medications on file as of 10/27/2024.    History: Past Medical History:  Diagnosis Date   Anemia    Aortic aneurysm    Arthritis    Blood transfusion without reported diagnosis    C. 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   Small bowel obstruction (HCC)    SVT (supraventricular tachycardia)    Past Surgical History:  Procedure Laterality Date   ABDOMINAL HYSTERECTOMY     BIOPSY  08/28/2023   Procedure: BIOPSY;  Surgeon: Cindie Carlin POUR, DO;  Location: [facility];  Service: Endoscopy;;   CATARACT EXTRACTION Bilateral    CHOLECYSTECTOMY     COLON SURGERY     ENTEROSCOPY N/A 08/28/2023   Procedure: ENTEROSCOPY;  Surgeon: Cindie Carlin POUR, DO;  Location: [facility];  Service: Endoscopy;  Laterality: N/A;  1015am, asa 3   ESOPHAGOGASTRODUODENOSCOPY (EGD) WITH PROPOFOL  N/A 08/28/2023   Procedure: ESOPHAGOGASTRODUODENOSCOPY (EGD) WITH PROPOFOL ;  Surgeon: Cindie Carlin POUR, DO;  Location: [facility];  Service: Endoscopy;  Laterality: N/A;   EYE SURGERY     Cataract   FRACTURE  SURGERY Right    knee   GASTRIC BYPASS     HERNIA REPAIR     hiatal hernia   JOINT REPLACEMENT     LAPAROTOMY N/A 02/02/2023   Procedure: EXPLORATORY LAPAROTOMY, [name], EXPLANT OF ABDOMINAL MESH;  Surgeon: Kallie Manuelita BROCKS, MD;  Location: [facility];  Service: General;  Laterality: N/A;   REPLACEMENT TOTAL KNEE Right    ROTATOR CUFF REPAIR Right    SMALL INTESTINE SURGERY  02/02/2023    TOTAL HIP ARTHROPLASTY Right    Family History  Problem Relation Age of Onset   CAD Father    Heart disease Father    CAD Brother    Heart disease Brother    Social History   Occupational History   Not on file  Tobacco Use   Smoking status: Never   Smokeless tobacco: Never  Vaping Use   Vaping status: Never Used  Substance and Sexual Activity   Alcohol use: Never   Drug use: Never   Sexual activity: Not Currently    Birth control/protection: Abstinence, None   Tobacco Counseling Counseling given: Yes  SDOH Screenings   Food Insecurity: No Food Insecurity (10/27/2024)  Housing: Low Risk (10/27/2024)  Transportation Needs: No Transportation Needs (10/27/2024)  Utilities: Not At Risk (10/27/2024)  Alcohol Screen: Low Risk (10/24/2023)  Depression (PHQ2-9): Low Risk (10/27/2024)  Financial Resource Strain: Low Risk (08/08/2024)  Physical Activity: Insufficiently Active (10/27/2024)  Social Connections: Moderately Integrated (10/27/2024)  Stress: Stress Concern Present (10/27/2024)  Tobacco Use: Low Risk (10/27/2024)  Health Literacy: Adequate Health Literacy (10/27/2024)   See flowsheets for full screening details  Depression Screen PHQ 2 & 9 Depression Scale- Over the past 2 weeks, how often have you been bothered by any of the following problems? Little interest or pleasure in doing things: 0 Feeling down, depressed, or hopeless (PHQ Adolescent also includes...irritable): 0 PHQ-2 Total Score: 0 Trouble falling or staying asleep, or sleeping too much: 0 Feeling tired or having little energy: 0 Poor appetite or overeating (PHQ Adolescent also includes...weight loss): 0 Feeling bad about yourself - or that you are a failure or have let yourself or your family down: 0 Trouble concentrating on things, such as reading the newspaper or watching television (PHQ Adolescent also includes...like school work): 0 Moving or speaking so slowly that other people could have noticed. Or the  opposite - being so fidgety or restless that you have been moving around a lot more than usual: 0 Thoughts that you would be better off dead, or of hurting yourself in some way: 0 PHQ-9 Total Score: 0 If you checked off any problems, how difficult have these problems made it for you to do your work, take care of things at home, or get along with other people?: Not difficult at all  Depression Treatment Depression Interventions/Treatment : EYV7-0 Score <4 Follow-up Not Indicated     Goals Addressed             This Visit's Progress    Have a speedy recovery from upcoming surgery               Objective:    Today's Vitals   10/27/24 1538  BP: 132/83  Pulse: 67  Weight: 150 lb (68 kg)  Height: 5' 3 (1.6 m)   Body mass index is 26.57 kg/m.  Hearing/Vision screen Hearing Screening - Comments:: Patient denies any hearing difficulties.   Vision Screening - Comments:: Wears rx glasses - up to  date with routine eye exams with  Odetta Bathe Immunizations and Health Maintenance Health Maintenance  Topic Date Due   COVID-19 Vaccine (1) Never done   DTaP/Tdap/Td (1 - Tdap) Never done   Pneumococcal Vaccine: 50+ Years (1 of 2 - PCV) Never done   Zoster Vaccines- Shingrix (1 of 2) Never done   Mammogram  10/07/2024   Influenza Vaccine  01/13/2025 (Originally 05/16/2024)   Medicare Annual Wellness (AWV)  10/27/2025   Bone Density Scan  04/15/2026   Colonoscopy  05/26/2032   Hepatitis C Screening  Completed   Meningococcal B Vaccine  Aged Out        Assessment/Plan:  This is a routine wellness examination for Kayla Dean.  Patient Care Team: Tobie Suzzane POUR, MD as PCP - General (Internal Medicine) Patty, A. Bathe, MD (Ophthalmology) Yoo, Jin Soo, MD as Consulting Physician [facility]) Shivaji, Lavonia HERO, MD as Consulting Physician (Obstetrics and Gynecology) Mealor, Eulas BRAVO, MD as Consulting Physician (Cardiology) Debera Jayson MATSU, MD as Consulting Physician  (Cardiology) Cindie Carlin POUR, DO as Consulting Physician (Gastroenterology)  I have personally reviewed and noted the following in the patients chart:   Medical and social history Use of alcohol, tobacco or illicit drugs  Current medications and supplements including opioid prescriptions. Functional ability and status Nutritional status Physical activity Advanced directives List of other physicians Hospitalizations, surgeries, and ER visits in previous 12 months Vitals Screenings to include cognitive, depression, and falls Referrals and appointments  No orders of the defined types were placed in this encounter.  In addition, I have reviewed and discussed with patient certain preventive protocols, quality metrics, and best practice recommendations. A written personalized care plan for preventive services as well as general preventive health recommendations were provided to patient.   Marwin Primmer, CMA   10/27/2024   Return October 28, 2025 at 11:20am, for In office Medicare Well Visit w  Wellness Nurse.  After Visit Summary: (MyChart) Due to this being a telephonic visit, the after visit summary with patients personalized plan was offered to patient via MyChart    "

## 2024-10-29 ENCOUNTER — Ambulatory Visit: Payer: Self-pay | Admitting: General Surgery

## 2024-10-29 NOTE — Progress Notes (Signed)
 Poor imaging quality as there is a lot of motion artifact. The left lower umbilical does have a defect that looks to be about 3cm. There could be other defects given the poor quality and motion as the epigastric area as very thinned out abdominal wall. We can bring her back in and discuss the option of the repair at the site we can palpate, but she could develop other hernias more superiorly or have something that we cannot see well due to the poor quality of the CT.

## 2024-11-18 ENCOUNTER — Ambulatory Visit: Admitting: General Surgery

## 2024-11-19 ENCOUNTER — Ambulatory Visit (HOSPITAL_COMMUNITY)
Admission: RE | Admit: 2024-11-19 | Discharge: 2024-11-19 | Disposition: A | Source: Ambulatory Visit | Attending: Internal Medicine

## 2024-11-19 DIAGNOSIS — Z1231 Encounter for screening mammogram for malignant neoplasm of breast: Secondary | ICD-10-CM

## 2024-11-21 ENCOUNTER — Encounter (HOSPITAL_COMMUNITY): Payer: Self-pay | Admitting: Obstetrics and Gynecology

## 2024-11-24 ENCOUNTER — Encounter (HOSPITAL_COMMUNITY): Admission: RE | Payer: Self-pay | Source: Home / Self Care

## 2024-11-24 ENCOUNTER — Ambulatory Visit (HOSPITAL_COMMUNITY): Admission: RE | Admit: 2024-11-24 | Admitting: Obstetrics and Gynecology

## 2024-11-24 HISTORY — DX: Personal history of diseases of the blood and blood-forming organs and certain disorders involving the immune mechanism: Z86.2

## 2024-11-24 HISTORY — DX: Supraventricular tachycardia, unspecified: I47.10

## 2024-11-24 HISTORY — DX: Atherosclerotic heart disease of native coronary artery without angina pectoris: I25.10

## 2024-11-24 HISTORY — DX: Complete loss of teeth, unspecified cause, unspecified class: K08.109

## 2024-11-24 HISTORY — DX: Presence of spectacles and contact lenses: Z97.3

## 2024-11-24 HISTORY — DX: Personal history of other diseases of the digestive system: Z87.19

## 2024-12-03 ENCOUNTER — Ambulatory Visit: Admitting: General Surgery

## 2024-12-09 ENCOUNTER — Ambulatory Visit: Admitting: Internal Medicine

## 2024-12-30 ENCOUNTER — Ambulatory Visit: Admitting: Cardiology

## 2025-10-28 ENCOUNTER — Ambulatory Visit: Payer: Self-pay
